# Patient Record
Sex: Male | Born: 1955 | Race: Black or African American | Hispanic: No | Marital: Single | Smoking: Current every day smoker
Health system: Southern US, Community
[De-identification: ages and names within clinical notes are randomized; demographics above are authoritative.]

## PROBLEM LIST (undated history)

## (undated) DIAGNOSIS — I499 Cardiac arrhythmia, unspecified: Secondary | ICD-10-CM

## (undated) DIAGNOSIS — J449 Chronic obstructive pulmonary disease, unspecified: Secondary | ICD-10-CM

## (undated) DIAGNOSIS — B182 Chronic viral hepatitis C: Secondary | ICD-10-CM

## (undated) DIAGNOSIS — Z992 Dependence on renal dialysis: Secondary | ICD-10-CM

## (undated) DIAGNOSIS — I10 Essential (primary) hypertension: Secondary | ICD-10-CM

## (undated) DIAGNOSIS — F4310 Post-traumatic stress disorder, unspecified: Secondary | ICD-10-CM

## (undated) DIAGNOSIS — F17200 Nicotine dependence, unspecified, uncomplicated: Secondary | ICD-10-CM

## (undated) DIAGNOSIS — R0602 Shortness of breath: Secondary | ICD-10-CM

## (undated) DIAGNOSIS — N186 End stage renal disease: Secondary | ICD-10-CM

## (undated) HISTORY — PX: PARACENTESIS: SHX844

---

## 2013-11-11 ENCOUNTER — Encounter (HOSPITAL_COMMUNITY): Payer: Self-pay | Admitting: Emergency Medicine

## 2013-11-11 ENCOUNTER — Emergency Department (HOSPITAL_COMMUNITY): Payer: Medicare Other

## 2013-11-11 ENCOUNTER — Inpatient Hospital Stay (HOSPITAL_COMMUNITY)
Admission: EM | Admit: 2013-11-11 | Discharge: 2013-11-17 | DRG: 640 | Disposition: A | Discharge status: Home / Self Care | Payer: Medicare Other | Attending: Internal Medicine | Admitting: Internal Medicine

## 2013-11-11 DIAGNOSIS — N2581 Secondary hyperparathyroidism of renal origin: Secondary | ICD-10-CM | POA: Diagnosis present

## 2013-11-11 DIAGNOSIS — F319 Bipolar disorder, unspecified: Secondary | ICD-10-CM | POA: Diagnosis present

## 2013-11-11 DIAGNOSIS — F329 Major depressive disorder, single episode, unspecified: Secondary | ICD-10-CM | POA: Diagnosis present

## 2013-11-11 DIAGNOSIS — I120 Hypertensive chronic kidney disease with stage 5 chronic kidney disease or end stage renal disease: Secondary | ICD-10-CM | POA: Diagnosis present

## 2013-11-11 DIAGNOSIS — I499 Cardiac arrhythmia, unspecified: Secondary | ICD-10-CM

## 2013-11-11 DIAGNOSIS — R112 Nausea with vomiting, unspecified: Secondary | ICD-10-CM

## 2013-11-11 DIAGNOSIS — R197 Diarrhea, unspecified: Secondary | ICD-10-CM

## 2013-11-11 DIAGNOSIS — B182 Chronic viral hepatitis C: Secondary | ICD-10-CM | POA: Diagnosis present

## 2013-11-11 DIAGNOSIS — K766 Portal hypertension: Secondary | ICD-10-CM | POA: Diagnosis present

## 2013-11-11 DIAGNOSIS — I10 Essential (primary) hypertension: Secondary | ICD-10-CM | POA: Diagnosis present

## 2013-11-11 DIAGNOSIS — F4310 Post-traumatic stress disorder, unspecified: Secondary | ICD-10-CM | POA: Diagnosis present

## 2013-11-11 DIAGNOSIS — D6959 Other secondary thrombocytopenia: Secondary | ICD-10-CM | POA: Diagnosis present

## 2013-11-11 DIAGNOSIS — J4489 Other specified chronic obstructive pulmonary disease: Secondary | ICD-10-CM | POA: Diagnosis present

## 2013-11-11 DIAGNOSIS — K7460 Unspecified cirrhosis of liver: Secondary | ICD-10-CM | POA: Diagnosis present

## 2013-11-11 DIAGNOSIS — Z992 Dependence on renal dialysis: Secondary | ICD-10-CM

## 2013-11-11 DIAGNOSIS — D631 Anemia in chronic kidney disease: Secondary | ICD-10-CM | POA: Diagnosis present

## 2013-11-11 DIAGNOSIS — N186 End stage renal disease: Secondary | ICD-10-CM

## 2013-11-11 DIAGNOSIS — F1410 Cocaine abuse, uncomplicated: Secondary | ICD-10-CM | POA: Diagnosis present

## 2013-11-11 DIAGNOSIS — I4891 Unspecified atrial fibrillation: Secondary | ICD-10-CM | POA: Diagnosis present

## 2013-11-11 DIAGNOSIS — E875 Hyperkalemia: Principal | ICD-10-CM | POA: Diagnosis present

## 2013-11-11 DIAGNOSIS — F1910 Other psychoactive substance abuse, uncomplicated: Secondary | ICD-10-CM | POA: Diagnosis present

## 2013-11-11 DIAGNOSIS — J9801 Acute bronchospasm: Secondary | ICD-10-CM | POA: Diagnosis present

## 2013-11-11 DIAGNOSIS — I480 Paroxysmal atrial fibrillation: Secondary | ICD-10-CM | POA: Diagnosis present

## 2013-11-11 DIAGNOSIS — Z5900 Homelessness unspecified: Secondary | ICD-10-CM

## 2013-11-11 DIAGNOSIS — F17200 Nicotine dependence, unspecified, uncomplicated: Secondary | ICD-10-CM | POA: Diagnosis present

## 2013-11-11 DIAGNOSIS — J449 Chronic obstructive pulmonary disease, unspecified: Secondary | ICD-10-CM | POA: Diagnosis present

## 2013-11-11 DIAGNOSIS — E8809 Other disorders of plasma-protein metabolism, not elsewhere classified: Secondary | ICD-10-CM | POA: Diagnosis present

## 2013-11-11 DIAGNOSIS — F32A Depression, unspecified: Secondary | ICD-10-CM | POA: Diagnosis present

## 2013-11-11 DIAGNOSIS — IMO0002 Reserved for concepts with insufficient information to code with codable children: Secondary | ICD-10-CM | POA: Diagnosis present

## 2013-11-11 DIAGNOSIS — R188 Other ascites: Secondary | ICD-10-CM | POA: Diagnosis present

## 2013-11-11 DIAGNOSIS — N039 Chronic nephritic syndrome with unspecified morphologic changes: Secondary | ICD-10-CM

## 2013-11-11 HISTORY — DX: End stage renal disease: N18.6

## 2013-11-11 HISTORY — DX: Nicotine dependence, unspecified, uncomplicated: F17.200

## 2013-11-11 HISTORY — DX: Dependence on renal dialysis: Z99.2

## 2013-11-11 HISTORY — DX: Cardiac arrhythmia, unspecified: I49.9

## 2013-11-11 HISTORY — DX: Chronic viral hepatitis C: B18.2

## 2013-11-11 HISTORY — DX: Chronic obstructive pulmonary disease, unspecified: J44.9

## 2013-11-11 HISTORY — DX: Essential (primary) hypertension: I10

## 2013-11-11 LAB — CBC WITH DIFFERENTIAL/PLATELET
Basophils Absolute: 0 10*3/uL (ref 0.0–0.1)
Basophils Relative: 0 % (ref 0–1)
EOS PCT: 0 % (ref 0–5)
Eosinophils Absolute: 0 10*3/uL (ref 0.0–0.7)
HEMATOCRIT: 35.9 % — AB (ref 39.0–52.0)
HEMOGLOBIN: 12.7 g/dL — AB (ref 13.0–17.0)
LYMPHS ABS: 2.1 10*3/uL (ref 0.7–4.0)
LYMPHS PCT: 18 % (ref 12–46)
MCH: 26.3 pg (ref 26.0–34.0)
MCHC: 35.4 g/dL (ref 30.0–36.0)
MCV: 74.3 fL — ABNORMAL LOW (ref 78.0–100.0)
MONO ABS: 1.4 10*3/uL — AB (ref 0.1–1.0)
MONOS PCT: 12 % (ref 3–12)
NEUTROS ABS: 7.8 10*3/uL — AB (ref 1.7–7.7)
Neutrophils Relative %: 70 % (ref 43–77)
Platelets: 113 10*3/uL — ABNORMAL LOW (ref 150–400)
RBC: 4.83 MIL/uL (ref 4.22–5.81)
RDW: 20 % — ABNORMAL HIGH (ref 11.5–15.5)
WBC: 11.3 10*3/uL — AB (ref 4.0–10.5)

## 2013-11-11 LAB — COMPREHENSIVE METABOLIC PANEL
ALT: 88 U/L — ABNORMAL HIGH (ref 0–53)
AST: 151 U/L — ABNORMAL HIGH (ref 0–37)
Albumin: 3.5 g/dL (ref 3.5–5.2)
Alkaline Phosphatase: 161 U/L — ABNORMAL HIGH (ref 39–117)
BILIRUBIN TOTAL: 3.7 mg/dL — AB (ref 0.3–1.2)
BUN: 79 mg/dL — AB (ref 6–23)
CALCIUM: 9.4 mg/dL (ref 8.4–10.5)
CHLORIDE: 88 meq/L — AB (ref 96–112)
CO2: 17 meq/L — AB (ref 19–32)
CREATININE: 8.72 mg/dL — AB (ref 0.50–1.35)
GFR, EST AFRICAN AMERICAN: 7 mL/min — AB (ref >90)
GFR, EST NON AFRICAN AMERICAN: 6 mL/min — AB (ref >90)
GLUCOSE: 67 mg/dL — AB (ref 70–99)
Potassium: 7.3 mEq/L (ref 3.7–5.3)
Sodium: 137 mEq/L (ref 137–147)
Total Protein: 8.5 g/dL — ABNORMAL HIGH (ref 6.0–8.3)

## 2013-11-11 LAB — MAGNESIUM: MAGNESIUM: 2.6 mg/dL — AB (ref 1.5–2.5)

## 2013-11-11 LAB — LACTATE DEHYDROGENASE: LDH: 454 U/L — ABNORMAL HIGH (ref 94–250)

## 2013-11-11 LAB — POTASSIUM: POTASSIUM: 3.6 meq/L — AB (ref 3.7–5.3)

## 2013-11-11 LAB — PHOSPHORUS: PHOSPHORUS: 6.6 mg/dL — AB (ref 2.3–4.6)

## 2013-11-11 LAB — TROPONIN I: Troponin I: <0.3 ng/mL (ref <0.30)

## 2013-11-11 MED ORDER — ONDANSETRON HCL 4 MG/2ML IJ SOLN: 4.0000 mg | Freq: Three times a day (TID) | INTRAMUSCULAR | Status: DC | PRN

## 2013-11-11 MED ORDER — INSULIN ASPART 100 UNIT/ML ~~LOC~~ SOLN
10.0000 [IU] | Freq: Once | SUBCUTANEOUS | Status: AC
  Administered 2013-11-11: 10 [IU] via SUBCUTANEOUS
  Filled 2013-11-11: qty 1

## 2013-11-11 MED ORDER — ONDANSETRON HCL 4 MG PO TABS: 4.0000 mg | ORAL_TABLET | Freq: Four times a day (QID) | ORAL | Status: DC | PRN

## 2013-11-11 MED ORDER — SODIUM CHLORIDE 0.9 % IJ SOLN
3.0000 mL | Freq: Two times a day (BID) | INTRAMUSCULAR | Status: DC
  Administered 2013-11-11: 3 mL via INTRAVENOUS

## 2013-11-11 MED ORDER — DILTIAZEM HCL 25 MG/5ML IV SOLN
5.0000 mg | INTRAVENOUS | Status: DC | PRN
  Filled 2013-11-11: qty 5

## 2013-11-11 MED ORDER — HEPARIN SODIUM (PORCINE) 5000 UNIT/ML IJ SOLN
5000.0000 [IU] | Freq: Three times a day (TID) | INTRAMUSCULAR | Status: DC
  Administered 2013-11-11 – 2013-11-15 (×7): 5000 [IU] via SUBCUTANEOUS
  Filled 2013-11-11 (×20): qty 1

## 2013-11-11 MED ORDER — SODIUM POLYSTYRENE SULFONATE 15 GM/60ML PO SUSP
30.0000 g | Freq: Once | ORAL | Status: AC
  Administered 2013-11-11: 30 g via ORAL
  Filled 2013-11-11: qty 120

## 2013-11-11 MED ORDER — IPRATROPIUM BROMIDE 0.02 % IN SOLN
0.5000 mg | Freq: Four times a day (QID) | RESPIRATORY_TRACT | Status: DC
  Administered 2013-11-11 – 2013-11-12 (×2): 0.5 mg via RESPIRATORY_TRACT
  Filled 2013-11-11 (×2): qty 2.5

## 2013-11-11 MED ORDER — DILTIAZEM HCL 100 MG IV SOLR: 5.0000 mg/h | INTRAVENOUS | Status: DC

## 2013-11-11 MED ORDER — DILTIAZEM HCL 100 MG IV SOLR
5.0000 mg/h | INTRAVENOUS | Status: DC
  Administered 2013-11-11: 5 mg/h via INTRAVENOUS
  Administered 2013-11-12: 15 mg/h via INTRAVENOUS
  Filled 2013-11-11 (×2): qty 100

## 2013-11-11 MED ORDER — SODIUM CHLORIDE 0.9 % IV SOLN: 250.0000 mL | INTRAVENOUS | Status: DC | PRN

## 2013-11-11 MED ORDER — ASPIRIN EC 81 MG PO TBEC
81.0000 mg | DELAYED_RELEASE_TABLET | Freq: Every day | ORAL | Status: DC
  Administered 2013-11-11 – 2013-11-17 (×6): 81 mg via ORAL
  Filled 2013-11-11 (×8): qty 1

## 2013-11-11 MED ORDER — SODIUM CHLORIDE 0.9 % IJ SOLN
3.0000 mL | Freq: Two times a day (BID) | INTRAMUSCULAR | Status: DC
  Administered 2013-11-12 – 2013-11-13 (×3): 3 mL via INTRAVENOUS

## 2013-11-11 MED ORDER — MORPHINE SULFATE 4 MG/ML IJ SOLN
4.0000 mg | Freq: Once | INTRAMUSCULAR | Status: AC
  Administered 2013-11-11: 4 mg via INTRAVENOUS
  Filled 2013-11-11: qty 1

## 2013-11-11 MED ORDER — ONDANSETRON HCL 4 MG/2ML IJ SOLN
4.0000 mg | Freq: Once | INTRAMUSCULAR | Status: AC
  Administered 2013-11-11: 4 mg via INTRAVENOUS
  Filled 2013-11-11: qty 2

## 2013-11-11 MED ORDER — DILTIAZEM HCL 25 MG/5ML IV SOLN
5.0000 mg | INTRAVENOUS | Status: AC | PRN
  Administered 2013-11-11 (×3): 5 mg via INTRAVENOUS
  Filled 2013-11-11: qty 5

## 2013-11-11 MED ORDER — ONDANSETRON HCL 4 MG/2ML IJ SOLN
4.0000 mg | Freq: Four times a day (QID) | INTRAMUSCULAR | Status: DC | PRN
Start: 2013-11-11 — End: 2013-11-17

## 2013-11-11 MED ORDER — DEXTROSE 50 % IV SOLN
1.0000 | Freq: Once | INTRAVENOUS | Status: AC
  Administered 2013-11-11: 50 mL via INTRAVENOUS
  Filled 2013-11-11: qty 50

## 2013-11-11 MED ORDER — ALBUTEROL SULFATE (2.5 MG/3ML) 0.083% IN NEBU
2.5000 mg | INHALATION_SOLUTION | Freq: Four times a day (QID) | RESPIRATORY_TRACT | Status: DC
  Administered 2013-11-11 – 2013-11-12 (×2): 2.5 mg via RESPIRATORY_TRACT
  Filled 2013-11-11 (×6): qty 3

## 2013-11-11 MED ORDER — SODIUM CHLORIDE 0.9 % IJ SOLN: 3.0000 mL | INTRAMUSCULAR | Status: DC | PRN

## 2013-11-11 NOTE — H&P (Signed)
Hospital Admission Note Date: 11/11/2013  Patient name: Carl Benson Medical record number: [id number] Date of birth: May 15, 1956 Age: 58 y.o. Gender: male [facility] [place]  Attending physician: Dr. Josem KaufmannKlima  Internal Medicine Teaching Service Contact Information  Weekday Hours (7AM-5PM):   1st contact: Marjan Rabbani       pgr: [phone]  2nd contact: Darden PalmerSamaya Qureshi    pgr: [phone]  ** If no return call within 15 minutes (after trying both pagers listed above), please call after hours pagers.   After 5 pm or weekends: 1st Contact: Pager: [phone] 2nd Contact: Pager: [phone]  Chief Complaint: nausea, vomiting, diarrhea and abdominal distension and pain  History of Present Illness:  Patient is 58 yo man with past medical history of ESRD on dialysis, HTN, HCV and ascites, COPD, who presents with nausea, vomiting, diarrhea, abdominal distension and pain.   Patient states that he was living in [place] alone until yesterday. He has two sons in [place] and decided to come back to [place]. He took long distant bus overnight and [facility]. He reports that he was supposed to do HD on M/W/F normally, but due to holiday, his HD scheduled was changed. His last HD was on Tuesday (3 days ago, 11/08/13). He states that he started having nausea, vomiting and diarrhea since yesterday. He vomited proximately 5 times, without blood in the vomitus. He had 4-5 times of watery diarrhea without blood in it. He denies recent antibiotics use. He has hx of HCV and developed ascites. He reports having abdominal distension and mild abdominal pain. No fever or chills.  He would like to have paracentesis. Last paracentesis was done approximately one month ago per patient. He could not tell whether he had infection (SBP) or not.   Patient was found to have K of 7.3 in ED without peaked T wave change, but EKG showed TWI in lateral and V4 to V6. He denies change pain. Patient  was treated with insulin and Kayexalate in ED.   Patient has COPD and is using inhalers at home, but can not tell what inhalers he is actually using. He has productive cough with yellow-colored sputum for approximately one month. He has a mild shortness of breath. His oxygen saturation is 96% on 2 L of oxygen in ED.   ROS:  Feels very tired and sleepy. Denies fever, chills, headaches, chest pain, dysuria, urgency, frequency, hematuria, joint pain. Has mild leg swelling bilaterally.   Meds: No current outpatient prescriptions on file.  Allergies: Allergies as of 11/11/2013  . (No Known Allergies)   Past Medical History  Diagnosis Date  . COPD (chronic obstructive pulmonary disease)   . Hypertension   . Hep C w/ coma, chronic   . Irregular heartbeat    Past surgical history: right arm A-V fistular placement Family history: mother died at 7850 years ago and father died at 30 years ago (reason not known), no brothers or sisters per pateint.   History   Social History  . Marital Status: Unknown    Spouse Name: N/A    Number of Children: N/A  . Years of Education: N/A   Occupational History  . Not on file.   Social History Main Topics  . Smoking status: Smoked 1 PAD for 50 years  . Smokeless tobacco: Not on file  . Alcohol Use: Social drinking  . Drug Use: no  . Sexual Activity: Not on file   Other Topics Concern  . Not  on file   Social History Narrative  . Used to live alone in [place], moved to here yesterday, no clear plan, has two sons in [place], but not in contact for more than 5 years.    Review of Systems: Full 14-point review of systems otherwise negative except as noted above in HPI. Physical Exam:   Filed Vitals:   11/11/13 0838 11/11/13 1430 11/11/13 1445  BP: 151/97 154/113 139/98  Pulse: 91 148 158  Temp: 97.6 F (36.4 C)    TempSrc: Oral    Resp: 22 24 32  SpO2: 96%      General: Not in acute distress. HEENT: PERRL, EOMI, no scleral icterus,  No JVD or bruit Cardiac: S1/S2, RRR, No murmurs, gallops or rubs Pulm: Has diffused rhonchi bilaterally, No rales, wheezing, or rubs. Abd: severely distended, diffused mild tender, no rebound pain, no organomegaly, BS present Ext: trace leg edema bilaterally. 2+DP/PT pulse bilaterally Musculoskeletal: No joint deformities, erythema, or stiffness, ROM full Skin: No rashes.  Neuro: Alert and oriented X3, cranial nerves II-XII grossly intact, muscle strength 5/5 in all extremeties, sensation to light touch intact. Psych: Patient is not psychotic, no suicidal or hemocidal ideation.  Lab results: Basic Metabolic Panel:  Recent Labs  56/21/30 0940  NA 137  K 7.3*  CL 88*  CO2 17*  GLUCOSE 67*  BUN 79*  CREATININE 8.72*  CALCIUM 9.4  MG 2.6*   Liver Function Tests:  Recent Labs  11/11/13 0940  AST 151*  ALT 88*  ALKPHOS 161*  BILITOT 3.7*  PROT 8.5*  ALBUMIN 3.5   No results found for this basename: LIPASE, AMYLASE,  in the last 72 hours No results found for this basename: AMMONIA,  in the last 72 hours CBC:  Recent Labs  11/11/13 0940  WBC 11.3*  NEUTROABS 7.8*  HGB 12.7*  HCT 35.9*  MCV 74.3*  PLT 113*   Cardiac Enzymes: No results found for this basename: CKTOTAL, CKMB, CKMBINDEX, TROPONINI,  in the last 72 hours BNP: No results found for this basename: PROBNP,  in the last 72 hours D-Dimer: No results found for this basename: DDIMER,  in the last 72 hours CBG: No results found for this basename: GLUCAP,  in the last 72 hours Hemoglobin A1C: No results found for this basename: HGBA1C,  in the last 72 hours Fasting Lipid Panel: No results found for this basename: CHOL, HDL, LDLCALC, TRIG, CHOLHDL, LDLDIRECT,  in the last 72 hours Thyroid Function Tests: No results found for this basename: TSH, T4TOTAL, FREET4, T3FREE, THYROIDAB,  in the last 72 hours Anemia Panel: No results found for this basename: VITAMINB12, FOLATE, FERRITIN, TIBC, IRON, RETICCTPCT,   in the last 72 hours Coagulation: No results found for this basename: LABPROT, INR,  in the last 72 hours Urine Drug Screen: Drugs of Abuse  No results found for this basename: labopia,  cocainscrnur,  labbenz,  amphetmu,  thcu,  labbarb    Alcohol Level: No results found for this basename: ETH,  in the last 72 hours Urinalysis: No results found for this basename: COLORURINE, APPERANCEUR, LABSPEC, PHURINE, GLUCOSEU, HGBUR, BILIRUBINUR, KETONESUR, PROTEINUR, UROBILINOGEN, NITRITE, LEUKOCYTESUR,  in the last 72 hours Misc. Labs:   Imaging results:  Dg Chest 2 View  11/11/2013   CLINICAL DATA:  Shortness of breath  EXAM: CHEST  2 VIEW  COMPARISON:  None.  FINDINGS: Low lung volumes. Cardiac silhouette is enlarged. Aorta is tortuous. Mild prominence of the interstitial markings. There is blunting of  the costophrenic angles. Minimal areas of increased density project within lung bases. No focal regions of consolidation. The osseous structures unremarkable.  IMPRESSION: Cardiomegaly. Pulmonary vascular congestion. Likely atelectasis within the lung bases. Trace bilateral effusions also a diagnostic consideration.   Electronically Signed   By: Salome Holmes M.D.   On: 11/11/2013 09:33    Other results:  EKG: Sinus rhythm, regular, normal R wave progression, normal QT interval, T-wave inversion in lateral and V4 to V6 leads (no previous EKG for comparison)  ssessment & Plan by Problem: Principal Problem:   Hyperkalemia Active Problems:   COPD (chronic obstructive pulmonary disease)   Hep C w/ coma, chronic   ESRD on hemodialysis   Ascites  58 year old man with past medical history of ESRD on dialysis, HTN, HCV and ascites, COPD, who presents with nausea, vomiting, diarrhea, abdominal distension and pain. Last HD was 11/08/13. K 7.3 on admission.  Cre 8.72 and BUN 79 on admission. Chest x-ray showed pulmonary vascular congestion. AST 151, ALT 88, ALT 161, total bilirubin 3.7, ALP 161, protein  8.5.  poc proBNP negative. WBC 11.3.   #: ESRD on HD: K 7.3, Cre 8.72, BUN 79, bicarbonate 17 on admission . Last HD was 3 days ago. His nausea, vomiting and diarrhea may be partially explained by the azotemia. Patient was treated with insulin and Kayexalate for hyperkalemia in ED. Renal was consulted by ED.  - will admitted to tele bed - will start HD per renal - will check phosphorus, PTH, Mg per renal - will need to get medical record from [facility]piatl.  #: Nausea, vomiting and diarrhea: It is likely due to azotemia secondary to noncompliance to dialysis. The other differential diagnoses include, gastroenteritis and C. difficile colitis (less likely given no recent history of antibiotics used).  -will treat symptomatically with Zofran for nausea -HD for Azotemia  #: A Fib: patient initial HR was 90 to 95 on admission, but increased to 140 to 150 while getting the HD. No chest pain. Bp stable. Dr. Arlean Hopping ordered Cardizem bolus 5 mg q15 minus. Patient said he may have A fib before. CHADS score is 1.   -will switch to Cardizem gtt when pt completes the HD.  -will start ASA 81 mg daily  #: Ascites secondary to hepatitis C: Patient has significant ascites with mild, but diffused abdominal tenderness. He also has leukocytosis with WBC 11.3. It is concerned for SBP.  -will get paracentesis for diagnostic and therapeutic purpose. -will f/u fluid analysis. Will treat with antibiotics if positive SBP  #: Abnormal EKG with T-wave inversion: Patient does not have chest pain, but EKG showed T wave inversion in lateral leads and V4 to V6. Not sure whether patient has history of CAD. -will get trop q6h x 3.  - check lipid profile and A1c  #: COPD:  patient has long history of smoking. One pack a day for 51 years. He is using inhalers at home, but can not tell what inhalers he is actually using. Patient has a productive cough, but oxygen saturation is good and the has only mild shortness of  breath. Not in acute exacerbation.  -will start albuterol and Atrovent nebulizers q6h , plus albuterol nebulizer when necessary for shortness of breath.   #:  Social issue:  Patient moved from [place] to [place] without clear plan for living here. He has two sons in [place], whom he has not seen for more than 5 years.  -will consult to SW  #  F/E/N  -SL -Hypokalemia:  Treated with insulin and Kayexalate, will be on HD dialysis today -Diet: renal  # DVT px: Heparin sq  Dispo: Disposition is deferred at this time, awaiting improvement of current medical problems. Anticipated discharge in approximately 3 day(s).   The patient does not have a current PCP (No primary provider on file.), therefore is not requiring OPC follow-up after discharge.   The patient does have transportation limitations that hinder transportation to clinic appointments.  Signed:  Lorretta Harp, MD PGY3, Internal Medicine Teaching Service Pager: [phone]  11/11/2013, 2:54 PM

## 2013-11-11 NOTE — ED Notes (Signed)
Pt vomiting, brown colored emesis. Josh Geiple aware.

## 2013-11-11 NOTE — Progress Notes (Signed)
Pt has money all over bed as well as a box of cigarettes. Pt informed of the policy about his belongings. Pt informed that we are not responsible for his belongings and that he could get his money as well as any other valuables sent down with security to be stored. Pt stated he was aware of the policies and is refusing to get them sent down. Pt informed that this is a smoke free facility and that he can not smoke here & stated he was aware. Will continue to monitor the pt. Sanda Linger, RN

## 2013-11-11 NOTE — ED Notes (Signed)
Pt states he needs his dialysis. States last dialysis on Tues. States he's from [place]. Pt also c/o nausea, vomiting, diarrhea. Pt awake, alert.

## 2013-11-11 NOTE — ED Notes (Signed)
Unable to obtain labs from IV start. Will page phlebotomy.

## 2013-11-11 NOTE — ED Notes (Signed)
Nephrology at bedside

## 2013-11-11 NOTE — ED Provider Notes (Signed)
CSN: [id number]     Arrival date & time 11/11/13  7225 History   First MD Initiated Contact with Patient 11/11/13 0831     Chief Complaint  Patient presents with  . Vascular Access Problem   (Consider location/radiation/quality/duration/timing/severity/associated sxs/prior Treatment) HPI Comments: Patient with history of ESRD on dialysis, COPD, ascites (hep C) -- presents with complaint of nausea, vomiting, and diarrhea. Patient was living in [place] until yesterday when he decided that he was moving back to [place]. He arrives here not having dialysis since Tuesday (3 days ago). Patient also would like to have paracentesis due to worsening ascites - last performed one month ago he states. No CP, SOB. His lower extremities are swollen. Also c/o N/V/D for the past 2 days. The onset of this condition was gradual. The course is gradually worsening. Aggravating factors: none. Alleviating factors: none.    The history is provided by the patient.    Past Medical History  Diagnosis Date  . COPD (chronic obstructive pulmonary disease)   . Hypertension   . Hep C w/ coma, chronic   . Irregular heartbeat    No past surgical history on file. No family history on file. History  Substance Use Topics  . Smoking status: Not on file  . Smokeless tobacco: Not on file  . Alcohol Use: Not on file    Review of Systems  Constitutional: Negative for fever.  HENT: Negative for rhinorrhea and sore throat.   Eyes: Negative for redness.  Respiratory: Negative for cough and shortness of breath.   Cardiovascular: Positive for leg swelling. Negative for chest pain.  Gastrointestinal: Positive for nausea, vomiting, diarrhea and abdominal distention. Negative for abdominal pain.  Genitourinary: Negative for dysuria.  Musculoskeletal: Negative for myalgias.  Skin: Negative for rash.  Neurological: Negative for headaches.    Allergies  Review of patient's allergies indicates no known allergies.  Home  Medications  No current outpatient prescriptions on file. BP 151/97  Pulse 91  Temp(Src) 97.6 F (36.4 C) (Oral)  Resp 22  SpO2 96% Physical Exam  Nursing note and vitals reviewed. Constitutional: He appears well-developed and well-nourished.  HENT:  Head: Normocephalic and atraumatic.  Eyes: Conjunctivae are normal. Right eye exhibits no discharge. Left eye exhibits no discharge.  Neck: Normal range of motion. Neck supple. No JVD present.  Cardiovascular: Normal rate and regular rhythm.   No murmur heard. Pulmonary/Chest: Effort normal and breath sounds normal.  Abdominal: Soft. He exhibits distension (ascites). There is no tenderness.  Musculoskeletal: He exhibits edema (2-3+ edema of lower legs to knees, symmetric). He exhibits no tenderness.  Neurological: He is alert.  Skin: Skin is warm and dry.  Psychiatric: He has a normal mood and affect.    ED Course  Procedures (including critical care time) Labs Review Labs Reviewed  CBC WITH DIFFERENTIAL - Abnormal; Notable for the following:    WBC 11.3 (*)    Hemoglobin 12.7 (*)    HCT 35.9 (*)    MCV 74.3 (*)    RDW 20.0 (*)    Platelets 113 (*)    Neutro Abs 7.8 (*)    Monocytes Absolute 1.4 (*)    All other components within normal limits  COMPREHENSIVE METABOLIC PANEL - Abnormal; Notable for the following:    Potassium 7.3 (*)    Chloride 88 (*)    CO2 17 (*)    Glucose, Bld 67 (*)    BUN 79 (*)    Creatinine, Ser 8.72 (*)  Total Protein 8.5 (*)    AST 151 (*)    ALT 88 (*)    Alkaline Phosphatase 161 (*)    Total Bilirubin 3.7 (*)    GFR calc non Af Amer 6 (*)    GFR calc Af Amer 7 (*)    All other components within normal limits  MAGNESIUM - Abnormal; Notable for the following:    Magnesium 2.6 (*)    All other components within normal limits   Imaging Review Dg Chest 2 View  11/11/2013   CLINICAL DATA:  Shortness of breath  EXAM: CHEST  2 VIEW  COMPARISON:  None.  FINDINGS: Low lung volumes. Cardiac  silhouette is enlarged. Aorta is tortuous. Mild prominence of the interstitial markings. There is blunting of the costophrenic angles. Minimal areas of increased density project within lung bases. No focal regions of consolidation. The osseous structures unremarkable.  IMPRESSION: Cardiomegaly. Pulmonary vascular congestion. Likely atelectasis within the lung bases. Trace bilateral effusions also a diagnostic consideration.   Electronically Signed   By: Salome HolmesHector  Cooper M.D.   On: 11/11/2013 09:33    EKG Interpretation   None      8:47 AM Patient seen and examined. Work-up initiated. Medications ordered.   Vital signs reviewed and are as follows: Filed Vitals:   11/11/13 0838  BP: 151/97  Pulse: 91  Temp: 97.6 F (36.4 C)  Resp: 22    Date: 11/11/2013  Rate: 91  Rhythm: normal sinus rhythm  QRS Axis: right  Intervals: normal  ST/T Wave abnormalities: nonspecific ST/T changes  Conduction Disutrbances:none  Narrative Interpretation:   Old EKG Reviewed: none available  10:15 AM Pt vomiting.   12:40 PM Hyperkalemia was noted. NO EKG changes. Patient given insulin and dextrose as well as kayexalate.   I spoke with Dr. Arta SilenceShertz who will arrange for dialysis today.   I have spoken with IMTS who will see and admit.   MDM   1. Hyperkalemia   2. Nausea vomiting and diarrhea   3. Ascites   4. ESRD (end stage renal disease)   5. COPD (chronic obstructive pulmonary disease)   6. Hep C w/ coma, chronic   7. Irregular heartbeat    Admit for above, will need urgent dialysis, stabilization of electrolytes, likely therapeutic paracentesis. Doubt SBP given minimal tenderness and no fever, mild leukocytosis.     Renne CriglerJoshua Orry Sigl, PA-C 11/11/13 1528
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
## 2013-11-11 NOTE — Progress Notes (Signed)
UR completed 

## 2013-11-11 NOTE — Procedures (Signed)
I was present at this dialysis session, have reviewed the session itself and made  appropriate changes  [name] MD (pgr) [phone]    ([phone] 11/11/2013, 1:45 PM

## 2013-11-11 NOTE — ED Notes (Signed)
Pt stating he has abdominal pain. Carl Benson aware.

## 2013-11-11 NOTE — Progress Notes (Signed)
Pt started on cardizem drip at 5mg /hr at 2012. Pt's cardizem increased to 10mg /hr at 2047. Pt's VS charted. Will continue to monitor the pt. Sanda Linger, RN

## 2013-11-11 NOTE — ED Notes (Signed)
Per [place] bus [place] in need of dialysis. Also reports nausea/vomiting/diarrhea x several days.

## 2013-11-12 ENCOUNTER — Inpatient Hospital Stay (HOSPITAL_COMMUNITY): Payer: Medicare Other

## 2013-11-12 DIAGNOSIS — Z5900 Homelessness unspecified: Secondary | ICD-10-CM

## 2013-11-12 DIAGNOSIS — Z992 Dependence on renal dialysis: Secondary | ICD-10-CM

## 2013-11-12 DIAGNOSIS — I499 Cardiac arrhythmia, unspecified: Secondary | ICD-10-CM

## 2013-11-12 DIAGNOSIS — I129 Hypertensive chronic kidney disease with stage 1 through stage 4 chronic kidney disease, or unspecified chronic kidney disease: Secondary | ICD-10-CM

## 2013-11-12 DIAGNOSIS — J449 Chronic obstructive pulmonary disease, unspecified: Secondary | ICD-10-CM

## 2013-11-12 DIAGNOSIS — I4891 Unspecified atrial fibrillation: Secondary | ICD-10-CM

## 2013-11-12 DIAGNOSIS — K7460 Unspecified cirrhosis of liver: Secondary | ICD-10-CM

## 2013-11-12 DIAGNOSIS — D696 Thrombocytopenia, unspecified: Secondary | ICD-10-CM

## 2013-11-12 DIAGNOSIS — N186 End stage renal disease: Secondary | ICD-10-CM

## 2013-11-12 DIAGNOSIS — F17200 Nicotine dependence, unspecified, uncomplicated: Secondary | ICD-10-CM

## 2013-11-12 DIAGNOSIS — R188 Other ascites: Secondary | ICD-10-CM

## 2013-11-12 DIAGNOSIS — D509 Iron deficiency anemia, unspecified: Secondary | ICD-10-CM

## 2013-11-12 LAB — BODY FLUID CELL COUNT WITH DIFFERENTIAL
Lymphs, Fluid: 38 %
Monocyte-Macrophage-Serous Fluid: 57 % (ref 50–90)
Neutrophil Count, Fluid: 2 % (ref 0–25)
Total Nucleated Cell Count, Fluid: 470 cu mm (ref 0–1000)

## 2013-11-12 LAB — CBC
HEMATOCRIT: 29.3 % — AB (ref 39.0–52.0)
Hemoglobin: 10.6 g/dL — ABNORMAL LOW (ref 13.0–17.0)
MCH: 26.3 pg (ref 26.0–34.0)
MCHC: 36.2 g/dL — ABNORMAL HIGH (ref 30.0–36.0)
MCV: 72.7 fL — ABNORMAL LOW (ref 78.0–100.0)
PLATELETS: 113 10*3/uL — AB (ref 150–400)
RBC: 4.03 MIL/uL — AB (ref 4.22–5.81)
RDW: 19.4 % — AB (ref 11.5–15.5)
WBC: 10.9 10*3/uL — AB (ref 4.0–10.5)

## 2013-11-12 LAB — BASIC METABOLIC PANEL
BUN: 43 mg/dL — AB (ref 6–23)
CHLORIDE: 92 meq/L — AB (ref 96–112)
CO2: 23 mEq/L (ref 19–32)
Calcium: 8.4 mg/dL (ref 8.4–10.5)
Creatinine, Ser: 5.7 mg/dL — ABNORMAL HIGH (ref 0.50–1.35)
GFR, EST AFRICAN AMERICAN: 11 mL/min — AB (ref >90)
GFR, EST NON AFRICAN AMERICAN: 10 mL/min — AB (ref >90)
Glucose, Bld: 146 mg/dL — ABNORMAL HIGH (ref 70–99)
Potassium: 4 mEq/L (ref 3.7–5.3)
SODIUM: 137 meq/L (ref 137–147)

## 2013-11-12 LAB — LACTATE DEHYDROGENASE, PLEURAL OR PERITONEAL FLUID: LD, Fluid: 142 U/L — ABNORMAL HIGH (ref 3–23)

## 2013-11-12 LAB — HEMOGLOBIN A1C
HEMOGLOBIN A1C: 5.3 % (ref <5.7)
Mean Plasma Glucose: 105 mg/dL (ref <117)

## 2013-11-12 LAB — GRAM STAIN

## 2013-11-12 LAB — PROTEIN, BODY FLUID: Total protein, fluid: 3.6 g/dL

## 2013-11-12 LAB — HEPATITIS B SURFACE ANTIGEN: HEP B S AG: NEGATIVE

## 2013-11-12 LAB — HEPATITIS B SURFACE ANTIBODY,QUALITATIVE: Hep B S Ab: POSITIVE — AB

## 2013-11-12 LAB — GLUCOSE, SEROUS FLUID: GLUCOSE FL: 169 mg/dL

## 2013-11-12 LAB — TROPONIN I: Troponin I: <0.3 ng/mL (ref <0.30)

## 2013-11-12 LAB — LIPID PANEL
Cholesterol: 92 mg/dL (ref 0–200)
HDL: 13 mg/dL — ABNORMAL LOW (ref >39)
LDL CALC: 63 mg/dL (ref 0–99)
Total CHOL/HDL Ratio: 7.1 RATIO
Triglycerides: 78 mg/dL (ref <150)
VLDL: 16 mg/dL (ref 0–40)

## 2013-11-12 LAB — HEPATITIS B CORE ANTIBODY, IGM: Hep B C IgM: NONREACTIVE

## 2013-11-12 MED ORDER — METOPROLOL TARTRATE 50 MG PO TABS
50.0000 mg | ORAL_TABLET | Freq: Two times a day (BID) | ORAL | Status: DC
  Administered 2013-11-12 – 2013-11-17 (×8): 50 mg via ORAL
  Filled 2013-11-12 (×11): qty 1

## 2013-11-12 MED ORDER — IPRATROPIUM BROMIDE 0.02 % IN SOLN
0.5000 mg | Freq: Two times a day (BID) | RESPIRATORY_TRACT | Status: DC
  Filled 2013-11-12: qty 2.5

## 2013-11-12 MED ORDER — DOXERCALCIFEROL 4 MCG/2ML IV SOLN
2.0000 ug | INTRAVENOUS | Status: DC
  Administered 2013-11-14: 2 ug via INTRAVENOUS
  Filled 2013-11-12: qty 2

## 2013-11-12 MED ORDER — ALBUTEROL SULFATE (2.5 MG/3ML) 0.083% IN NEBU
2.5000 mg | INHALATION_SOLUTION | Freq: Two times a day (BID) | RESPIRATORY_TRACT | Status: DC
  Filled 2013-11-12 (×3): qty 3

## 2013-11-12 MED ORDER — DIPHENHYDRAMINE HCL 25 MG PO CAPS
25.0000 mg | ORAL_CAPSULE | Freq: Once | ORAL | Status: AC
  Administered 2013-11-12: 25 mg via ORAL
  Filled 2013-11-12 (×2): qty 1

## 2013-11-12 MED ORDER — DARBEPOETIN ALFA-POLYSORBATE 25 MCG/0.42ML IJ SOLN
25.0000 ug | INTRAMUSCULAR | Status: DC
Start: 2013-11-14 — End: 2013-11-17
  Administered 2013-11-14: 25 ug via INTRAVENOUS
  Filled 2013-11-12: qty 0.42

## 2013-11-12 MED ORDER — CALCIUM ACETATE 667 MG PO CAPS
1334.0000 mg | ORAL_CAPSULE | Freq: Three times a day (TID) | ORAL | Status: DC
  Administered 2013-11-12 – 2013-11-17 (×11): 1334 mg via ORAL
  Filled 2013-11-12 (×18): qty 2

## 2013-11-12 MED ORDER — DARBEPOETIN ALFA-POLYSORBATE 25 MCG/0.42ML IJ SOLN: 25.0000 ug | INTRAMUSCULAR | Status: DC
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
## 2013-11-12 NOTE — Procedures (Signed)
Successful US guided paracentesis from RLQ.  Yielded 1.3L of clear dark yellow fluid.  No immediate complications.  Pt tolerated well.   Specimen was sent for labs.  Brayton El PA-C 11/12/2013 10:22 AM

## 2013-11-12 NOTE — Procedures (Signed)
I was present at this dialysis session, have reviewed the session itself and made  appropriate changes  Rob Antolin Belsito MD (pgr) [phone]    (c) [phone] 11/12/2013, 2:10 PM   

## 2013-11-12 NOTE — Progress Notes (Signed)
MD notified of pt c/o of itching per pt he's allergic to benadryl it causes him to itch. MD also aware of pt converting to NSR hr in the 70s. No new orders given at this time will continue to monitor the pt. Sanda Linger, RN

## 2013-11-12 NOTE — Progress Notes (Addendum)
Subjective:  Pt seen and examined in AM. No acute events overnight. Pt reports feeling well. Still with abdominal distension and diffuse pain, however denies fever, chills, nausea, vomiting, change in BM or urination.   Objective: Vital signs in last 24 hours: Filed Vitals:   11/12/13 1500 11/12/13 1530 11/12/13 1543 11/12/13 1632  BP: 112/69 119/67 120/70 120/72  Pulse: 87 84 84 89  Temp:   98 F (36.7 C)   TempSrc:   Oral   Resp:   20   Height:      Weight:   55.6 kg (122 lb 9.2 oz)   SpO2:   99% 93%   Weight change:   Intake/Output Summary (Last 24 hours) at 11/12/13 1711 Last data filed at 11/12/13 1543  Gross per 24 hour  Intake    780 ml  Output   6288 ml  Net  -5508 ml   General: Not in acute distress.  HEENT: PERRL, EOMI, no scleral icterus, No JVD or bruit  Cardiac: S1/S2, RRR, No murmurs, gallops or rubs  Pulm:  No rales, wheezing, or rubs.  Abd: severely distended, diffused mild tender, no rebound pain, no organomegaly, BS present  Ext: trace leg edema bilaterally  Musculoskeletal: No joint deformities, erythema, or stiffness, ROM full  Skin: No rashes.  Neuro: Alert and oriented X3.  Psych: Angry   Lab Results: Basic Metabolic Panel:  Recent Labs Lab 11/11/13 0940 11/11/13 1628 11/11/13 2222 11/12/13 0513  NA 137  --   --  137  K 7.3* 3.6*  --  4.0  CL 88*  --   --  92*  CO2 17*  --   --  23  GLUCOSE 67*  --   --  146*  BUN 79*  --   --  43*  CREATININE 8.72*  --   --  5.70*  CALCIUM 9.4  --   --  8.4  MG 2.6*  --   --   --   PHOS  --   --  6.6*  --    Liver Function Tests:  Recent Labs Lab 11/11/13 0940  AST 151*  ALT 88*  ALKPHOS 161*  BILITOT 3.7*  PROT 8.5*  ALBUMIN 3.5   No results found for this basename: LIPASE, AMYLASE,  in the last 168 hours No results found for this basename: AMMONIA,  in the last 168 hours CBC:  Recent Labs Lab 11/11/13 0940 11/12/13 0513  WBC 11.3* 10.9*  NEUTROABS 7.8*  --   HGB 12.7* 10.6*    HCT 35.9* 29.3*  MCV 74.3* 72.7*  PLT 113* 113*   Cardiac Enzymes:  Recent Labs Lab 11/11/13 2222 11/12/13 0740  TROPONINI <0.30 <0.30   BNP: No results found for this basename: PROBNP,  in the last 168 hours D-Dimer: No results found for this basename: DDIMER,  in the last 168 hours CBG: No results found for this basename: GLUCAP,  in the last 168 hours Hemoglobin A1C: No results found for this basename: HGBA1C,  in the last 168 hours Fasting Lipid Panel:  Recent Labs Lab 11/12/13 0513  CHOL 92  HDL 13*  LDLCALC 63  TRIG 78  CHOLHDL 7.1   Thyroid Function Tests: No results found for this basename: TSH, T4TOTAL, FREET4, T3FREE, THYROIDAB,  in the last 168 hours Coagulation: No results found for this basename: LABPROT, INR,  in the last 168 hours Anemia Panel: No results found for this basename: VITAMINB12, FOLATE, FERRITIN, TIBC, IRON, RETICCTPCT,  in the last 168 hours Urine Drug Screen: Drugs of Abuse  No results found for this basename: labopia, cocainscrnur, labbenz, amphetmu, thcu, labbarb    Alcohol Level: No results found for this basename: ETH,  in the last 168 hours Urinalysis: No results found for this basename: COLORURINE, APPERANCEUR, LABSPEC, PHURINE, GLUCOSEU, HGBUR, BILIRUBINUR, KETONESUR, PROTEINUR, UROBILINOGEN, NITRITE, LEUKOCYTESUR,  in the last 168 hours   Micro Results: Recent Results (from the past 240 hour(s))  GRAM STAIN     Status: None   Collection Time    11/12/13 10:14 AM      Result Value Range Status   Specimen Description ASCITIC FLUID   Final   Special Requests NONE   Final   Gram Stain     Final   Value: WBC PRESENT,BOTH PMN AND MONONUCLEAR     NO ORGANISMS SEEN     CYTOSPIN SLIDE     Gram Stain Report Called to,Read Back By and Verified With: COLE A.,RN 11/12/13 1117 BY JONESJ   Report Status 11/12/2013 FINAL   Final   Studies/Results: Dg Chest 2 View  11/11/2013   CLINICAL DATA:  Shortness of breath  EXAM: CHEST  2  VIEW  COMPARISON:  None.  FINDINGS: Low lung volumes. Cardiac silhouette is enlarged. Aorta is tortuous. Mild prominence of the interstitial markings. There is blunting of the costophrenic angles. Minimal areas of increased density project within lung bases. No focal regions of consolidation. The osseous structures unremarkable.  IMPRESSION: Cardiomegaly. Pulmonary vascular congestion. Likely atelectasis within the lung bases. Trace bilateral effusions also a diagnostic consideration.   Electronically Signed   By: Salome Holmes M.D.   On: 11/11/2013 09:33   US Paracentesis  11/12/2013   CLINICAL DATA:  Abdominal pain, history of hepatitis-C. Ascites. Request diagnostic and therapeutic paracentesis.  EXAM: ULTRASOUND GUIDED PARACENTESIS  COMPARISON:  None.  PROCEDURE: An ultrasound guided paracentesis was thoroughly discussed with the patient and questions answered. The benefits, risks, alternatives and complications were also discussed. The patient understands and wishes to proceed with the procedure. Written consent was obtained.  Ultrasound was performed to localize and mark an adequate pocket of fluid in the right lower quadrant of the abdomen. The area was then prepped and draped in the normal sterile fashion. 1% Lidocaine was used for local anesthesia. Under ultrasound guidance a 19 gauge Yueh catheter was introduced. Paracentesis was performed. The catheter was removed and a dressing applied.  COMPLICATIONS: None immediate  FINDINGS: A total of approximately 1.3 L of clear, dark yellow fluid was removed. A fluid sample was sent for laboratory analysis.  IMPRESSION: Successful ultrasound guided paracentesis yielding 1.3 L of ascites.  Read by: Brayton El PA-C   Electronically Signed   By: Richarda Overlie M.D.   On: 11/12/2013 10:24   Medications: I have reviewed the patient's current medications. Scheduled Meds: . albuterol  2.5 mg Nebulization BID  . aspirin EC  81 mg Oral Daily  . calcium acetate   1,334 mg Oral TID WC  . [START ON 11/14/2013] darbepoetin  25 mcg Intravenous Q Mon-HD  . [START ON 11/14/2013] doxercalciferol  2 mcg Intravenous Q Mon-HD  . heparin  5,000 Units Subcutaneous Q8H  . ipratropium  0.5 mg Nebulization BID  . metoprolol tartrate  50 mg Oral BID  . sodium chloride  3 mL Intravenous Q12H  . sodium chloride  3 mL Intravenous Q12H   Continuous Infusions:  PRN Meds:.sodium chloride, diltiazem, ondansetron (ZOFRAN) IV, ondansetron, sodium chloride Assessment/Plan:  Principal Problem:   Hyperkalemia Active Problems:   COPD (chronic obstructive pulmonary disease)   Hep C w/ coma, chronic   ESRD on hemodialysis   Ascites  Assessment: 58 year old man with ESRD on HD who presents with abdominal distension and pain and found to have hyperkalemia.    Hyperkalemia - resolved. Pt presented with K 7.3 without EKG changes. Pt received IV insulin and Kayexalate in ED.   -Continue HD -Continue to monitor BMP  End Stage Renal Disease on HD - on MWF schedule -Monitor wt (no change) and I & Os (-4 .3L out) -PTH pending  -Start phoslo binder -Consider rena-vit -Continue vitamin D 2ug per week -Dialysis center search -Nephrology following  Atrial Fibrillation  - currently normal sinus.rhythm.  Pt without home medications diltiazem and carvedilol and went into afib with RVR in HD where IV diltiazem was administered.  CEs negative and pt chest-pain free.  -D/C IV diltiazem -Start metoprolol 50 mg BID -Continue 81 mg aspirin daily  Hypertension - currently normotensive. At home on amlodipine and carvedilol (?).  -Start metoprolol 50 mg BID  Chronic liver cirrhosis with ascites - Most likely due to chronic Hepatitis C virus. Diagnostic and therapeutic (1.3L) paracentesis performed on 1/3 with no PMN>250 or single organism on culture to suggest SBP    -Awaiting cytology of ascites fluid -Consider diuretic therapy  Microcytic anemia -stable H/H without active bleeding or  hemodynamic instability. Most likely due to CKD with   -Continue to monitor  -Continue ESA with darbepotein 25 weekly -Continue hectorol wit HD -Consider anemia panel to assess iron stores and need for Epo and Venofer  Thrombocytopenia - stable with no active bleeding or bruising. Pt presented with platelt count of 113K with unknown baseline. Most likely due to chronic liver cirrhosis.  -Continue to monitor CBC  COPD - no recent exacerbations requiring steroid use. Pt with reported productive cough for 1 month and mild dyspnea on admission, with no wheezing or chest tightness. Pt on unknown inhalers and chronic heavy smoker with 50 pack history.  -Albuterol nebulizer PRN acute bronchospasm -Ipatropium nebulizer BID  Substance Abuse - Pt with history of cocaine use. Also cigarette smoker (1 pack 50 yrs). -Encourage drug cessation   Homelessness - Pt recently moved from [place] and currently homeless, however per pt he receives money monthly. Has 2 sons in town however has not had contact in past 5 years. States he does not want to go to a shelter.  -SW consult  -[facility]   Dispo: Disposition is deferred at this time, awaiting improvement of current medical problems.  Anticipated discharge in approximately 3-4 day(s).   The patient does not have a current PCP (No primary provider on file.) and does need [facility] after discharge.  The patient does have transportation limitations that hinder transportation to clinic appointments.  .Services Needed at time of discharge: Y = Yes, Blank = No PT:   OT:   RN:   Equipment:   Other:     LOS: 1 day   Otis BraceMarjan Shirley Bolle, MD 11/12/2013, 5:11 PM

## 2013-11-12 NOTE — Progress Notes (Signed)
Pt still a-fib hr in he low 100s to 120s. Pt currently trying to leave to go outside to smoke. Pt informed that this was a smoke free facility. Nicotine patch offered pt refused stating it would interfere with his current medications. Pt requesting something for sleep. MD notified. Will continue to monitor the pt. Sanda Linger, RN

## 2013-11-12 NOTE — Progress Notes (Signed)
  [facility] Progress Note   Subjective: Had some cramping w hd yest, 4.3 kg off with no BP drop  Filed Vitals:   11/11/13 2047 11/11/13 2239 11/12/13 0355 11/12/13 0813  BP: 148/110 144/107 123/79 131/85  Pulse: 155 158 53 90  Temp:   98.1 F (36.7 C)   TempSrc:   Oral   Resp:   18   Height:      Weight:   55.215 kg (121 lb 11.6 oz)   SpO2: 100% 98% 92%   Exam:   Alert, no distress Clear bilat, no rales or wheezing  RRR no M or rub  Abd tense ascites, mild diffuse tenderness  2+ bilat LE edema  Alert, nf, ox3, no asterixis RUA AV graft is patent  CXR- clear   Outpatient Dialysis: MWF in [place], [place] (ph [phone])  4h   58.5kg    F160    2K/2.5Ca Bath    Heparin 3000  RUA AVGG Hectorol 2ug once per wk     EPO 8000 1x per wk  Assessment/Plan:  1. Hyperkalemia: resolved 2. Volume overload / LE edema: extra HD today for volume overload, will need lower dry wt 3. Ascites: hep C per pt 4. Afib with RVR: on dilt drip, HR 90's, taking ASA also 5. ESRD: usual HD MWF in [place], is here without outpatient HD orders. He says he is going to be staying here in [place]  6. Ascites: according to pt he has hep C, and has had one paracentesis about a month ago 7. 2HPTH: CA ok, phos high 6.6, pth pending, cont vit D 2 ug once per week, start phoslo as binder 8. Anemia of CKD: Hb 10.6, cont esa with darbe 25/wk 9. COPD  [name] MD pager [phone]    cell [phone] 11/12/2013, 8:47 AM   Recent Labs Lab 11/11/13 0940 11/11/13 1628 11/11/13 2222 11/12/13 0513  NA 137  --   --  137  K 7.3* 3.6*  --  4.0  CL 88*  --   --  92*  CO2 17*  --   --  23  GLUCOSE 67*  --   --  146*  BUN 79*  --   --  43*  CREATININE 8.72*  --   --  5.70*  CALCIUM 9.4  --   --  8.4  PHOS  --   --  6.6*  --     Recent Labs Lab 11/11/13 0940  AST 151*  ALT 88*  ALKPHOS 161*  BILITOT 3.7*  PROT 8.5*  ALBUMIN 3.5    Recent Labs Lab 11/11/13 0940 11/12/13 0513   WBC 11.3* 10.9*  NEUTROABS 7.8*  --   HGB 12.7* 10.6*  HCT 35.9* 29.3*  MCV 74.3* 72.7*  PLT 113* 113*   . albuterol  2.5 mg Nebulization Q6H  . aspirin EC  81 mg Oral Daily  . heparin  5,000 Units Subcutaneous Q8H  . ipratropium  0.5 mg Nebulization Q6H  . sodium chloride  3 mL Intravenous Q12H  . sodium chloride  3 mL Intravenous Q12H   . diltiazem (CARDIZEM) infusion 10 mg/hr (11/12/13 0810)   sodium chloride, diltiazem, ondansetron (ZOFRAN) IV, ondansetron, sodium chloride

## 2013-11-13 ENCOUNTER — Encounter (HOSPITAL_COMMUNITY): Payer: Self-pay | Admitting: *Deleted

## 2013-11-13 DIAGNOSIS — F1910 Other psychoactive substance abuse, uncomplicated: Secondary | ICD-10-CM | POA: Diagnosis present

## 2013-11-13 DIAGNOSIS — I10 Essential (primary) hypertension: Secondary | ICD-10-CM | POA: Diagnosis present

## 2013-11-13 DIAGNOSIS — F329 Major depressive disorder, single episode, unspecified: Secondary | ICD-10-CM | POA: Diagnosis present

## 2013-11-13 DIAGNOSIS — B182 Chronic viral hepatitis C: Secondary | ICD-10-CM | POA: Diagnosis present

## 2013-11-13 DIAGNOSIS — F32A Depression, unspecified: Secondary | ICD-10-CM | POA: Diagnosis present

## 2013-11-13 DIAGNOSIS — I480 Paroxysmal atrial fibrillation: Secondary | ICD-10-CM | POA: Diagnosis present

## 2013-11-13 DIAGNOSIS — K7460 Unspecified cirrhosis of liver: Secondary | ICD-10-CM

## 2013-11-13 DIAGNOSIS — F4310 Post-traumatic stress disorder, unspecified: Secondary | ICD-10-CM | POA: Diagnosis present

## 2013-11-13 LAB — RENAL FUNCTION PANEL
Albumin: 2.4 g/dL — ABNORMAL LOW (ref 3.5–5.2)
BUN: 35 mg/dL — ABNORMAL HIGH (ref 6–23)
CALCIUM: 7.9 mg/dL — AB (ref 8.4–10.5)
CHLORIDE: 93 meq/L — AB (ref 96–112)
CO2: 25 meq/L (ref 19–32)
Creatinine, Ser: 5.25 mg/dL — ABNORMAL HIGH (ref 0.50–1.35)
GFR calc non Af Amer: 11 mL/min — ABNORMAL LOW (ref >90)
GFR, EST AFRICAN AMERICAN: 13 mL/min — AB (ref >90)
GLUCOSE: 121 mg/dL — AB (ref 70–99)
POTASSIUM: 3.8 meq/L (ref 3.7–5.3)
Phosphorus: 5.3 mg/dL — ABNORMAL HIGH (ref 2.3–4.6)
SODIUM: 136 meq/L — AB (ref 137–147)

## 2013-11-13 LAB — CBC
HCT: 29.4 % — ABNORMAL LOW (ref 39.0–52.0)
Hemoglobin: 10.5 g/dL — ABNORMAL LOW (ref 13.0–17.0)
MCH: 26.2 pg (ref 26.0–34.0)
MCHC: 35.7 g/dL (ref 30.0–36.0)
MCV: 73.3 fL — ABNORMAL LOW (ref 78.0–100.0)
PLATELETS: 94 10*3/uL — AB (ref 150–400)
RBC: 4.01 MIL/uL — AB (ref 4.22–5.81)
RDW: 20 % — ABNORMAL HIGH (ref 11.5–15.5)
WBC: 9.7 10*3/uL (ref 4.0–10.5)

## 2013-11-13 LAB — MAGNESIUM: MAGNESIUM: 2 mg/dL (ref 1.5–2.5)

## 2013-11-13 MED ORDER — ZOLPIDEM TARTRATE 5 MG PO TABS
5.0000 mg | ORAL_TABLET | Freq: Every evening | ORAL | Status: DC | PRN
  Administered 2013-11-13: 5 mg via ORAL
  Filled 2013-11-13: qty 1

## 2013-11-13 MED ORDER — ALBUTEROL SULFATE HFA 108 (90 BASE) MCG/ACT IN AERS
1.0000 | INHALATION_SPRAY | Freq: Four times a day (QID) | RESPIRATORY_TRACT | Status: DC | PRN
  Administered 2013-11-13: 2 via RESPIRATORY_TRACT
  Filled 2013-11-13 (×2): qty 6.7

## 2013-11-13 MED ORDER — TRAZODONE 25 MG HALF TABLET
25.0000 mg | ORAL_TABLET | Freq: Every day | ORAL | Status: DC
  Administered 2013-11-13 – 2013-11-16 (×4): 25 mg via ORAL
  Filled 2013-11-13 (×6): qty 1

## 2013-11-13 MED ORDER — IPRATROPIUM-ALBUTEROL 0.5-2.5 (3) MG/3ML IN SOLN
3.0000 mL | Freq: Two times a day (BID) | RESPIRATORY_TRACT | Status: DC
  Filled 2013-11-13: qty 3

## 2013-11-13 NOTE — Progress Notes (Signed)
Pt pulled out his PIV and went outside to smoke.I had some concern about patients safety, but patient is none compliant and told me that he is a "bad boy" and left the floor with his walker.

## 2013-11-13 NOTE — Progress Notes (Signed)
[facility] Progress Note   Subjective: 1.9kg off with HD yest, pt signed off early. Lowest BP in HD was 110 syst.   Filed Vitals:   11/12/13 1543 11/12/13 1632 11/12/13 2100 11/13/13 0500  BP: 120/70 120/72 121/63 130/83  Pulse: 84 89 85 75  Temp: 98 F (36.7 C)  98.6 F (37 C) 98.2 F (36.8 C)  TempSrc: Oral     Resp: 20  16 16   Height:      Weight: 55.6 kg (122 lb 9.2 oz)   57.199 kg (126 lb 1.6 oz)  SpO2: 99% 93% 96% 100%  Exam:   Alert, no distress Clear bilat, no rales or wheezing  RRR no M or rub  Abd distended w ascites, nontender 1-2+ bilat LE edema  Alert, nf, ox3, no asterixis RUA AV graft is patent  CXR- clear   Outpatient Dialysis: MWF in [place], [place] (ph [phone])  4h   58.5kg    F160    2K/2.5Ca Bath    Heparin 3000  RUA AVGG Hectorol 2ug once per wk     EPO 8000 1x per wk  Assessment/Plan:  1. Afib with RVR: on dilt drip, HR 90's, taking ASA also 2. Ascites / Hep C: s/p paracentesis 3. Volume overload / LE edema: not much success with getting vol down, pt signing off early 4. ESRD: HD MWF. Usual HD [place], [place], pt is here without outpatient HD orders. He says he is going to be staying here in [place]; will need to start CLIP process 5. 2HPTH: CA ok, phos high 6.6, pth pending, cont vit D 2 ug once per week, started phoslo as binder 6. Anemia of CKD: Hb 10.6, cont esa with darbe 25/wk 7. COPD: stable 8. Hyperkalemia: resolved  [name] Keon Benscoter MD pager [phone]    cell [phone] 11/13/2013, 11:08 AM   Recent Labs Lab 11/11/13 0940 11/11/13 1628 11/11/13 2222 11/12/13 0513 11/13/13 0524  NA 137  --   --  137 136*  K 7.3* 3.6*  --  4.0 3.8  CL 88*  --   --  92* 93*  CO2 17*  --   --  23 25  GLUCOSE 67*  --   --  146* 121*  BUN 79*  --   --  43* 35*  CREATININE 8.72*  --   --  5.70* 5.25*  CALCIUM 9.4  --   --  8.4 7.9*  PHOS  --   --  6.6*  --  5.3*    Recent Labs Lab 11/11/13 0940 11/13/13 0524  AST 151*  --    ALT 88*  --   ALKPHOS 161*  --   BILITOT 3.7*  --   PROT 8.5*  --   ALBUMIN 3.5 2.4*    Recent Labs Lab 11/11/13 0940 11/12/13 0513 11/13/13 0524  WBC 11.3* 10.9* 9.7  NEUTROABS 7.8*  --   --   HGB 12.7* 10.6* 10.5*  HCT 35.9* 29.3* 29.4*  MCV 74.3* 72.7* 73.3*  PLT 113* 113* 94*   . albuterol  2.5 mg Nebulization BID  . aspirin EC  81 mg Oral Daily  . calcium acetate  1,334 mg Oral TID WC  . [START ON 11/14/2013] darbepoetin  25 mcg Intravenous Q Mon-HD  . [START ON 11/14/2013] doxercalciferol  2 mcg Intravenous Q Mon-HD  . heparin  5,000 Units Subcutaneous Q8H  . ipratropium  0.5 mg Nebulization BID  . metoprolol tartrate  50 mg Oral BID  .  sodium chloride  3 mL Intravenous Q12H     diltiazem, ondansetron (ZOFRAN) IV, ondansetron, zolpidem

## 2013-11-13 NOTE — Progress Notes (Signed)
Subjective:  [name] was seen and examined at bedside this morning. He was in a better mood and claims he is now ready to talk with social work in regards to placement. He wishes to establish here long term, will [facility] and [facility]. He had another session of HD yesterday.He reports feeling much better since admission, no more abdominal pain, nausea or vomiting. No chest pain or shortness of breath.   S/p paracentesis yesterday.   Objective: Vital signs in last 24 hours: Filed Vitals:   11/12/13 1543 11/12/13 1632 11/12/13 2100 11/13/13 0500  BP: 120/70 120/72 121/63 130/83  Pulse: 84 89 85 75  Temp: 98 F (36.7 C)  98.6 F (37 C) 98.2 F (36.8 C)  TempSrc: Oral     Resp: 20  16 16   Height:      Weight: 122 lb 9.2 oz (55.6 kg)   126 lb 1.6 oz (57.199 kg)  SpO2: 99% 93% 96% 100%   Weight change: 3 lb 12 oz (1.7 kg)  Intake/Output Summary (Last 24 hours) at 11/13/13 1554 Last data filed at 11/12/13 2100  Gross per 24 hour  Intake    240 ml  Output    200 ml  Net     40 ml   General: Sitting up in bed, NAD HEENT: PERRL, EOMI Cardiac: S1/S2, RRR Pulm:  CTA B/L  Abd: soft, +bs, non-tender to palpation Ext: b/l lower extremity edema, +1 pitting Musculoskeletal: Moving all four extremities, darkened lower extremities. RAVF.  Skin: Dry Neuro: Alert and oriented X3, strength equal in b/l extremities Psych: more calm today, hx of Bipolar disorder   Lab Results: Basic Metabolic Panel:  Recent Labs Lab 11/11/13 0940  11/11/13 2222 11/12/13 0513 11/13/13 0524  NA 137  --   --  137 136*  K 7.3*  < >  --  4.0 3.8  CL 88*  --   --  92* 93*  CO2 17*  --   --  23 25  GLUCOSE 67*  --   --  146* 121*  BUN 79*  --   --  43* 35*  CREATININE 8.72*  --   --  5.70* 5.25*  CALCIUM 9.4  --   --  8.4 7.9*  MG 2.6*  --   --   --  2.0  PHOS  --   --  6.6*  --  5.3*  < > = values in this interval not displayed. Liver Function Tests:  Recent Labs Lab  11/11/13 0940 11/13/13 0524  AST 151*  --   ALT 88*  --   ALKPHOS 161*  --   BILITOT 3.7*  --   PROT 8.5*  --   ALBUMIN 3.5 2.4*   CBC:  Recent Labs Lab 11/11/13 0940 11/12/13 0513 11/13/13 0524  WBC 11.3* 10.9* 9.7  NEUTROABS 7.8*  --   --   HGB 12.7* 10.6* 10.5*  HCT 35.9* 29.3* 29.4*  MCV 74.3* 72.7* 73.3*  PLT 113* 113* 94*   Cardiac Enzymes:  Recent Labs Lab 11/11/13 2222 11/12/13 0740  TROPONINI <0.30 <0.30   Hemoglobin A1C:  Recent Labs Lab 11/12/13 0513  HGBA1C 5.3   Fasting Lipid Panel:  Recent Labs Lab 11/12/13 0513  CHOL 92  HDL 13*  LDLCALC 63  TRIG 78  CHOLHDL 7.1   Micro Results: Recent Results (from the past 240 hour(s))  BODY FLUID CULTURE     Status: None  Collection Time    11/12/13 10:14 AM      Result Value Range Status   Specimen Description ASCITIC FLUID   Final   Special Requests NONE   Final   Gram Stain     Final   Value: WBC PRESENT,BOTH PMN AND MONONUCLEAR     NO ORGANISMS SEEN     CYTOSPIN Gram Stain Report Called to,Read Back By and Verified With: Gram Stain Report Called to,Read Back By and Verified With: COLE A  RN 11/12/13 1117 BY JONESJ Performed at [facility]     Performed at [facility]   Culture     Final   Value: NO GROWTH 1 DAY     Performed at [facility]   Report Status PENDING   Incomplete  GRAM STAIN     Status: None   Collection Time    11/12/13 10:14 AM      Result Value Range Status   Specimen Description ASCITIC FLUID   Final   Special Requests NONE   Final   Gram Stain     Final   Value: WBC PRESENT,BOTH PMN AND MONONUCLEAR     NO ORGANISMS SEEN     CYTOSPIN SLIDE     Gram Stain Report Called to,Read Back By and Verified With: COLE A.,RN 11/12/13 1117 BY JONESJ   Report Status 11/12/2013 FINAL   Final   Studies/Results: US Paracentesis  11/12/2013   CLINICAL DATA:  Abdominal pain, history of hepatitis-C. Ascites. Request diagnostic and therapeutic paracentesis.   EXAM: ULTRASOUND GUIDED PARACENTESIS  COMPARISON:  None.  PROCEDURE: An ultrasound guided paracentesis was thoroughly discussed with the patient and questions answered. The benefits, risks, alternatives and complications were also discussed. The patient understands and wishes to proceed with the procedure. Written consent was obtained.  Ultrasound was performed to localize and mark an adequate pocket of fluid in the right lower quadrant of the abdomen. The area was then prepped and draped in the normal sterile fashion. 1% Lidocaine was used for local anesthesia. Under ultrasound guidance a 19 gauge Yueh catheter was introduced. Paracentesis was performed. The catheter was removed and a dressing applied.  COMPLICATIONS: None immediate  FINDINGS: A total of approximately 1.3 L of clear, dark yellow fluid was removed. A fluid sample was sent for laboratory analysis.  IMPRESSION: Successful ultrasound guided paracentesis yielding 1.3 L of ascites.  Read by: Brayton El PA-C   Electronically Signed   By: Richarda Overlie M.D.   On: 11/12/2013 10:24   Medications: I have reviewed the patient's current medications. Scheduled Meds: . albuterol  2.5 mg Nebulization BID  . aspirin EC  81 mg Oral Daily  . calcium acetate  1,334 mg Oral TID WC  . [START ON 11/14/2013] darbepoetin  25 mcg Intravenous Q Mon-HD  . [START ON 11/14/2013] doxercalciferol  2 mcg Intravenous Q Mon-HD  . heparin  5,000 Units Subcutaneous Q8H  . ipratropium  0.5 mg Nebulization BID  . metoprolol tartrate  50 mg Oral BID  . sodium chloride  3 mL Intravenous Q12H   Continuous Infusions:  PRN Meds:.diltiazem, ondansetron (ZOFRAN) IV, ondansetron, zolpidem Assessment/Plan: Principal Problem:   Hyperkalemia Active Problems:   Substance abuse   PAF (paroxysmal atrial fibrillation)   COPD (chronic obstructive pulmonary disease)   Hep C w/ coma, chronic   ESRD on hemodialysis   Ascites  Assessment: [name] is a 58 year old man with ESRD on HD  who was admitted for hyperkalemia and  GI complaints.    Hyperkalemia - resolved with HD x2 sessions since admission and IV insulin and Kayelexate in ED . K down to 3.8 today.  -will continue to monitor electolytes  End Stage Renal Disease on HD - on MWF schedule. HD yesterday with 1.9kg off but signed off early. Weight up today 4 pounds: 122-126lbs.  Since he has left [place], he will need to step up with HD center here in [place]. Per renal, they will start CLIP process. -HD per renal, appreciate them following -PTH pending  -Continue phoslo, rena-vit, and vitamin D 2ug per week -Dialysis center search  Atrial Fibrillation with RVR--resolved, hx of afib but [facility] without any of his medications. Initially placed on Cardizem gtt but transitioned off to oral metoprolol yesterday and remains rate controlled.  Awaiting records from [place] in [place] to verify home medications.  -Continue metoprolol 50 mg BID and ASA 81mg  daily for now.   Hypertension --currently normotensive. Need to verify home medications -Stable currently on metoprolol 50 mg BID  Chronic liver cirrhosis with ascites - Most likely due to chronic Hepatitis C virus. Diagnostic and therapeutic (1.3L) paracentesis performed on 1/3 with no PMN>250 or single organism on culture to suggest SBP. LDH elevated at 142.  -Awaiting cytology of ascites fluid: no growth x1 day on fluid culture, no organisms seen on gram stain  Microcytic anemia in setting of CKD.  Hb stable.   -Continue ESA with darbepotein 25 weekly -Iron panel in AM  Thrombocytopenia - stable with no active bleeding or bruising. Pt presented with platelt count of 113K with unknown baseline. Most likely due to chronic liver cirrhosis.  -Continue to monitor CBC  COPD--stable, denies any recent exacerbations but does have chronic productive cough x1 month and continue to smoke cigarettes, marijuana, and used cocaine approximately 1 month ago.  -Albuterol  nebulizer PRN acute bronchospasm -Ipatropium nebulizer BID  Substance Abuse - Pt with history of cocaine use, marijuana, tobacco, and occasional alcohol.. -Encouraged cessation   Homelessness - Recently moved here from [place] and plans to stay. Does not want to live in ALF, because he says he cannot live with other people very well.  He does have some family in the area, his sons, however, he has not bee in touch with them for the past 5 years. He also does not wish to go to a shelter -Appreciate social work and [employer] assistance for [employer]  -Obtain medical records from [place]  Insomnia--has had trouble sleeping past two nights.  -started Ambien 5mg  PRN  Bipolar disorder--self reported, need to verify medications from [place].   Dispo: Disposition is deferred at this time, awaiting improvement of current medical problems.  Anticipated discharge in approximately 3-4 day(s).   The patient does not have a current PCP (No primary provider on file.) and does need an [facility] hospital follow-up appointment after discharge.  The patient does have transportation limitations that hinder transportation to clinic appointments.  Services Needed at time of discharge: Y = Yes, Blank = No PT:   OT:   RN:   Equipment:   Other:     LOS: 2 days   Darden Palmer, MD 11/13/2013, 3:54 PM

## 2013-11-14 ENCOUNTER — Encounter (HOSPITAL_COMMUNITY): Payer: Self-pay | Admitting: *Deleted

## 2013-11-14 DIAGNOSIS — E875 Hyperkalemia: Principal | ICD-10-CM

## 2013-11-14 LAB — RENAL FUNCTION PANEL
Albumin: 2.4 g/dL — ABNORMAL LOW (ref 3.5–5.2)
BUN: 54 mg/dL — ABNORMAL HIGH (ref 6–23)
CO2: 26 meq/L (ref 19–32)
Calcium: 8.2 mg/dL — ABNORMAL LOW (ref 8.4–10.5)
Chloride: 90 mEq/L — ABNORMAL LOW (ref 96–112)
Creatinine, Ser: 6.53 mg/dL — ABNORMAL HIGH (ref 0.50–1.35)
GFR, EST AFRICAN AMERICAN: 10 mL/min — AB (ref >90)
GFR, EST NON AFRICAN AMERICAN: 8 mL/min — AB (ref >90)
Glucose, Bld: 141 mg/dL — ABNORMAL HIGH (ref 70–99)
PHOSPHORUS: 3.9 mg/dL (ref 2.3–4.6)
Potassium: 4.3 mEq/L (ref 3.7–5.3)
SODIUM: 135 meq/L — AB (ref 137–147)

## 2013-11-14 LAB — PARATHYROID HORMONE, INTACT (NO CA): PTH: 618 pg/mL — ABNORMAL HIGH (ref 14.0–72.0)

## 2013-11-14 MED ORDER — HEPARIN SODIUM (PORCINE) 1000 UNIT/ML DIALYSIS: 3000.0000 [IU] | Freq: Once | INTRAMUSCULAR | Status: DC

## 2013-11-14 MED ORDER — SODIUM CHLORIDE 0.9 % IV SOLN: 100.0000 mL | INTRAVENOUS | Status: DC | PRN

## 2013-11-14 MED ORDER — BENZONATATE 100 MG PO CAPS
100.0000 mg | ORAL_CAPSULE | Freq: Two times a day (BID) | ORAL | Status: DC | PRN
  Filled 2013-11-14: qty 1

## 2013-11-14 MED ORDER — DIPHENHYDRAMINE HCL 25 MG PO CAPS
25.0000 mg | ORAL_CAPSULE | Freq: Once | ORAL | Status: AC
  Administered 2013-11-14: 25 mg via ORAL
  Filled 2013-11-14: qty 1

## 2013-11-14 MED ORDER — LIDOCAINE-PRILOCAINE 2.5-2.5 % EX CREA: 1.0000 "application " | TOPICAL_CREAM | CUTANEOUS | Status: DC | PRN

## 2013-11-14 MED ORDER — DARBEPOETIN ALFA-POLYSORBATE 25 MCG/0.42ML IJ SOLN
INTRAMUSCULAR | Status: AC
  Administered 2013-11-14: 25 ug via INTRAVENOUS
  Filled 2013-11-14: qty 0.42

## 2013-11-14 MED ORDER — LIDOCAINE HCL (PF) 1 % IJ SOLN: 5.0000 mL | INTRAMUSCULAR | Status: DC | PRN

## 2013-11-14 MED ORDER — PENTAFLUOROPROP-TETRAFLUOROETH EX AERO: 1.0000 "application " | INHALATION_SPRAY | CUTANEOUS | Status: DC | PRN

## 2013-11-14 MED ORDER — ALTEPLASE 2 MG IJ SOLR
2.0000 mg | Freq: Once | INTRAMUSCULAR | Status: DC | PRN
  Filled 2013-11-14: qty 2

## 2013-11-14 MED ORDER — PRAZOSIN HCL 2 MG PO CAPS
2.0000 mg | ORAL_CAPSULE | Freq: Every day | ORAL | Status: DC
  Filled 2013-11-14: qty 1

## 2013-11-14 MED ORDER — PRAZOSIN HCL 2 MG PO CAPS
4.0000 mg | ORAL_CAPSULE | Freq: Every day | ORAL | Status: DC
  Administered 2013-11-14 – 2013-11-16 (×3): 4 mg via ORAL
  Filled 2013-11-14 (×4): qty 2

## 2013-11-14 MED ORDER — HEPARIN SODIUM (PORCINE) 1000 UNIT/ML DIALYSIS: 1000.0000 [IU] | INTRAMUSCULAR | Status: DC | PRN

## 2013-11-14 MED ORDER — DOXERCALCIFEROL 4 MCG/2ML IV SOLN
INTRAVENOUS | Status: AC
  Administered 2013-11-14: 2 ug via INTRAVENOUS
  Filled 2013-11-14: qty 2

## 2013-11-14 MED ORDER — NEPRO/CARBSTEADY PO LIQD: 237.0000 mL | ORAL | Status: DC | PRN

## 2013-11-14 NOTE — Procedures (Signed)
I have personally attended this patient's dialysis session.   Right upper arm AVG 400 2K2.25 Ca bath (K 4.3) Tolerating TMT thus far. 2+ edema of LE's    Moriah Shawley B
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
## 2013-11-14 NOTE — Progress Notes (Signed)
  Date: 11/14/2013  Patient name: Carl Benson  Medical record number: [id number]  Date of birth: 12/20/1955   This patient has been seen and the plan of care was discussed with the house staff. Please see their note for complete details. I concur with their findings.  Inez Catalina, MD 11/14/2013, 4:45 PM

## 2013-11-14 NOTE — Progress Notes (Addendum)
Subjective:  Pt seen and examined in AM in HD. No acute events overnight. Pt reports feeling well. Complaining of dry cough. Abdominal distension and diffuse pain has improved. He denies fever, chills, nausea, vomiting, change in BM or urination.   Objective: Vital signs in last 24 hours: Filed Vitals:   11/14/13 1130 11/14/13 1200 11/14/13 1230 11/14/13 1300  BP: 158/107 158/104 151/93 163/102  Pulse: 81 83 81 84  Temp:      TempSrc:      Resp: 18 20 20 21   Height:      Weight:      SpO2:       Weight change: 0.96 kg (2 lb 1.9 oz)  Intake/Output Summary (Last 24 hours) at 11/14/13 1310 Last data filed at 11/13/13 2152  Gross per 24 hour  Intake    236 ml  Output      0 ml  Net    236 ml   General: Not in acute distress.  HEENT: PERRL, EOMI, no scleral icterus, No JVD or bruit  Cardiac: S1/S2, RRR, No murmurs, gallops or rubs  Pulm:  No rales, wheezing, or rubs.  Abd: moderately distended, diffused mild tender, no rebound pain, no organomegaly, BS present  Ext: trace leg edema bilaterally  Musculoskeletal: No joint deformities, erythema, or stiffness, ROM full  Skin: No rashes.  Neuro: Alert and oriented X3.  Psych: Cooperative   Lab Results: Basic Metabolic Panel:  Recent Labs Lab 11/11/13 0940  11/13/13 0524 11/14/13 0510  NA 137  < > 136* 135*  K 7.3*  < > 3.8 4.3  CL 88*  < > 93* 90*  CO2 17*  < > 25 26  GLUCOSE 67*  < > 121* 141*  BUN 79*  < > 35* 54*  CREATININE 8.72*  < > 5.25* 6.53*  CALCIUM 9.4  < > 7.9* 8.2*  MG 2.6*  --  2.0  --   PHOS  --   < > 5.3* 3.9  < > = values in this interval not displayed. Liver Function Tests:  Recent Labs Lab 11/11/13 0940 11/13/13 0524 11/14/13 0510  AST 151*  --   --   ALT 88*  --   --   ALKPHOS 161*  --   --   BILITOT 3.7*  --   --   PROT 8.5*  --   --   ALBUMIN 3.5 2.4* 2.4*   No results found for this basename: LIPASE, AMYLASE,  in the last 168 hours No results found for this basename: AMMONIA,   in the last 168 hours CBC:  Recent Labs Lab 11/11/13 0940 11/12/13 0513 11/13/13 0524  WBC 11.3* 10.9* 9.7  NEUTROABS 7.8*  --   --   HGB 12.7* 10.6* 10.5*  HCT 35.9* 29.3* 29.4*  MCV 74.3* 72.7* 73.3*  PLT 113* 113* 94*   Cardiac Enzymes:  Recent Labs Lab 11/11/13 2222 11/12/13 0740  TROPONINI <0.30 <0.30   BNP: No results found for this basename: PROBNP,  in the last 168 hours D-Dimer: No results found for this basename: DDIMER,  in the last 168 hours CBG: No results found for this basename: GLUCAP,  in the last 168 hours Hemoglobin A1C:  Recent Labs Lab 11/12/13 0513  HGBA1C 5.3   Fasting Lipid Panel:  Recent Labs Lab 11/12/13 0513  CHOL 92  HDL 13*  LDLCALC 63  TRIG 78  CHOLHDL 7.1   Thyroid Function Tests: No results found for this basename:  TSH, T4TOTAL, FREET4, T3FREE, THYROIDAB,  in the last 168 hours Coagulation: No results found for this basename: LABPROT, INR,  in the last 168 hours Anemia Panel: No results found for this basename: VITAMINB12, FOLATE, FERRITIN, TIBC, IRON, RETICCTPCT,  in the last 168 hours Urine Drug Screen: Drugs of Abuse  No results found for this basename: labopia,  cocainscrnur,  labbenz,  amphetmu,  thcu,  labbarb    Alcohol Level: No results found for this basename: ETH,  in the last 168 hours Urinalysis: No results found for this basename: COLORURINE, APPERANCEUR, LABSPEC, PHURINE, GLUCOSEU, HGBUR, BILIRUBINUR, KETONESUR, PROTEINUR, UROBILINOGEN, NITRITE, LEUKOCYTESUR,  in the last 168 hours   Micro Results: Recent Results (from the past 240 hour(s))  BODY FLUID CULTURE     Status: None   Collection Time    11/12/13 10:14 AM      Result Value Range Status   Specimen Description ASCITIC FLUID   Final   Special Requests NONE   Final   Gram Stain     Final   Value: WBC PRESENT,BOTH PMN AND MONONUCLEAR     NO ORGANISMS SEEN     CYTOSPIN Gram Stain Report Called to,Read Back By and Verified With: Gram Stain  Report Called to,Read Back By and Verified With: COLE A  RN 11/12/13 1117 BY JONESJ Performed at [facility]     Performed at [facility]   Culture     Final   Value: NO GROWTH 1 DAY     Performed at [facility]   Report Status PENDING   Incomplete  GRAM STAIN     Status: None   Collection Time    11/12/13 10:14 AM      Result Value Range Status   Specimen Description ASCITIC FLUID   Final   Special Requests NONE   Final   Gram Stain     Final   Value: WBC PRESENT,BOTH PMN AND MONONUCLEAR     NO ORGANISMS SEEN     CYTOSPIN SLIDE     Gram Stain Report Called to,Read Back By and Verified With: COLE A.,RN 11/12/13 1117 BY JONESJ   Report Status 11/12/2013 FINAL   Final   Studies/Results: No results found. Medications: I have reviewed the patient's current medications. Scheduled Meds: . aspirin EC  81 mg Oral Daily  . calcium acetate  1,334 mg Oral TID WC  . darbepoetin  25 mcg Intravenous Q Mon-HD  . doxercalciferol  2 mcg Intravenous Q Mon-HD  . [START ON 11/15/2013] heparin  3,000 Units Dialysis Once in dialysis  . heparin  5,000 Units Subcutaneous Q8H  . metoprolol tartrate  50 mg Oral BID  . sodium chloride  3 mL Intravenous Q12H  . traZODone  25 mg Oral QHS   Continuous Infusions:  PRN Meds:.sodium chloride, sodium chloride, albuterol, alteplase, benzonatate, diltiazem, feeding supplement (NEPRO CARB STEADY), heparin, lidocaine (PF), lidocaine-prilocaine, ondansetron (ZOFRAN) IV, ondansetron, pentafluoroprop-tetrafluoroeth Assessment/Plan: Principal Problem:   Hyperkalemia Active Problems:   COPD (chronic obstructive pulmonary disease)   Hep C w/ coma, chronic   ESRD on hemodialysis   Ascites   Substance abuse   PAF (paroxysmal atrial fibrillation)   HTN (hypertension)   Cirrhosis of liver due to hepatitis C   PTSD (post-traumatic stress disorder)   Depression  Assessment: 58 year old man with ESRD on HD who presented on 1/2 with abdominal  distension and pain and found to have hyperkalemia.    End Stage Renal Disease on HD -  on MWF schedule -Monitor wt (122 to 134) and I & Os (not recorded yesterday) -PTH --> elevated at 618, corrected calcium WNL, Phosp WNL  -Continue phoslo, rena-vit, vitamin D 2ug per week -Dialysis center search -Nephrology following  Atrial Fibrillation  - currently normal sinus rhythm  -Continue metoprolol 50 mg BID -Continue 81 mg aspirin daily  Hypertension - currently hypertensive   -Continue metoprolol 50 mg BID  Chronic liver cirrhosis with ascites - Most likely due to chronic Hepatitis C virus. Diagnostic and therapeutic (1.3L) paracentesis performed on 1/3 with no PMN>250 or single organism on culture to suggest SBP    -Awaiting cytology of ascites fluid -Consider diuretic therapy  Microcytic anemia -stable H/H without active bleeding or hemodynamic instability. Most likely due to CKD.   -Continue to monitor  -Continue ESA with darbepotein 25 weekly -Continue hectorol with HD  Thrombocytopenia - stable with no active bleeding or bruising. Pt presented with platelt count of 113K with unknown baseline. Most likely due to chronic liver cirrhosis.  -Continue to monitor CBC  COPD - no recent exacerbations requiring steroid use. Pt with reported productive cough for 1 month and mild dyspnea on admission, with no wheezing or chest tightness. Pt on unknown inhalers and chronic heavy smoker with 50 pack history.  -Albuterol nebulizer PRN acute bronchospasm -Ipatropium nebulizer BID  Substance Abuse - Pt with history of cocaine use. Also cigarette smoker (1 pack 50 yrs). -Encourage drug cessation   Homelessness - Pt recently moved from [place] and currently homeless, however per pt he receives money monthly. Has 2 sons in town however has not had contact in past 5 years. States he does not want to go to a shelter.  -SW consult today -[place]  Insomnia--has  had trouble sleeping past two nights  -Ambien 5mg  PRN -Prazosin 4 mg at night for nightmares -Trazodone 25 mg at night  Dispo: Disposition is deferred at this time, awaiting improvement of current medical problems.  Anticipated discharge in approximately 3 day(s).   The patient does not have a current PCP (No primary provider on file.) and does need [facility] follow-up appointment after discharge.  The patient does have transportation limitations that hinder transportation to clinic appointments.  .Services Needed at time of discharge: Y = Yes, Blank = No PT:   OT:   RN:   Equipment:   Other:     LOS: 3 days   Otis BraceMarjan Gwynn Chalker, MD 11/14/2013, 1:10 PM

## 2013-11-14 NOTE — ED Provider Notes (Signed)
Medical screening examination/treatment/procedure(s) were conducted as a shared visit with non-physician practitioner(s) and myself.  I personally evaluated the patient during the encounter.  EKG Interpretation    Date/Time:  Friday November 11 2013 17:07:26 EST Ventricular Rate:  157 PR Interval:    QRS Duration: 98 QT Interval:  314 QTC Calculation: 507 R Axis:   -38 Text Interpretation:  Atrial flutter with variable A-V block Left axis deviation Left ventricular hypertrophy with repolarization abnormality Abnormal ECG ED PHYSICIAN INTERPRETATION AVAILABLE IN [facility] Confirmed by TEST, RECORD ([id number]) on 11/13/2013 9:36:13 AM            Patient with hyperkalemia from missed dialysis. Stable, no EKG changes. Abd exam is soft w/o focal tenderness, feel SBP is unlikely. He is requesting therapeutic paracentesis. Will admit for both paracentesis and dialysis.  Audree Camel, MD 11/14/13 [id number]

## 2013-11-14 NOTE — Progress Notes (Signed)
[facility] Progress Note   Subjective: Seen in dialysis - initiating treatment Says he needs a place to stay but will "not be with family, not in a shelter and not in the hood"   Filed Vitals:   11/12/13 2100 11/13/13 0500 11/13/13 2100 11/14/13 0500  BP: 121/63 130/83 145/92 157/102  Pulse: 85 75 86 86  Temp: 98.6 F (37 C) 98.2 F (36.8 C) 98.7 F (37.1 C) 99.5 F (37.5 C)  TempSrc:      Resp: 16 16 18 18   Height:      Weight:  57.199 kg (126 lb 1.6 oz)  58.06 kg (128 lb)  SpO2: 96% 100% 99% 100%  Exam:   Alert, no distress Clear bilat, no rales or wheezing  RRR no M or rub  Abd distended w ascites, nontender and fairly soft despite distension 1-2+ bilat LE edema  Alert, nf, ox3, no asterixis RUA AV graft is patent with + bruit and thrill  CXR- clear   Outpatient Dialysis: MWF in [place] (ph [phone])  4h   58.5kg    F160    2K/2.5Ca Bath    Heparin 3000  RUA AVGG Hectorol 2ug once per wk     EPO 8000 1x per wk  Assessment/Plan:  1. Afib with RVR: rate controlled now; on metoprolol 2. Ascites / Hep C: s/p paracentesis (1/3 liters; 1/3) 3. Volume overload / LE edema: not much success with getting vol down, pt signing off early; dialysis today on his prev outpt schedule 4. ESRD: HD MWF. Usual HD [place], [place], pt is here without outpatient HD orders or place to dialyze. He says he is going to be staying here in [place] but no home as of yet; will start CLIP process 5. 2HPTH: CA ok, phos high 6.6, pth pending, cont vit D 2 ug once per week, started phoslo as binder 6. Anemia of CKD: Hb 10.6, cont esa with darbe 25/wk 7. COPD: stable 8. Hyperkalemia: resolved 9. Social - biggest issue will be to secure living arrangements as we cannot do anything about outpt HD until he has a place to live; says he cannot stay with family, refuses to go to a shelter - "I have money"    Recent Labs Lab 11/11/13 2222 11/12/13 0513 11/13/13 0524  11/14/13 0510  NA  --  137 136* 135*  K  --  4.0 3.8 4.3  CL  --  92* 93* 90*  CO2  --  23 25 26   GLUCOSE  --  146* 121* 141*  BUN  --  43* 35* 54*  CREATININE  --  5.70* 5.25* 6.53*  CALCIUM  --  8.4 7.9* 8.2*  PHOS 6.6*  --  5.3* 3.9    Recent Labs Lab 11/11/13 0940 11/13/13 0524 11/14/13 0510  AST 151*  --   --   ALT 88*  --   --   ALKPHOS 161*  --   --   BILITOT 3.7*  --   --   PROT 8.5*  --   --   ALBUMIN 3.5 2.4* 2.4*    Recent Labs Lab 11/11/13 0940 11/12/13 0513 11/13/13 0524  WBC 11.3* 10.9* 9.7  NEUTROABS 7.8*  --   --   HGB 12.7* 10.6* 10.5*  HCT 35.9* 29.3* 29.4*  MCV 74.3* 72.7* 73.3*  PLT 113* 113* 94*   Medications: . aspirin EC  81 mg Oral Daily  . calcium acetate  1,334 mg Oral TID WC  .  darbepoetin  25 mcg Intravenous Q Mon-HD  . doxercalciferol  2 mcg Intravenous Q Mon-HD  . [START ON 11/15/2013] heparin  3,000 Units Dialysis Once in dialysis  . heparin  5,000 Units Subcutaneous Q8H  . metoprolol tartrate  50 mg Oral BID  . sodium chloride  3 mL Intravenous Q12H  . traZODone  25 mg Oral QHS     sodium chloride, sodium chloride, albuterol, alteplase, diltiazem, feeding supplement (NEPRO CARB STEADY), heparin, lidocaine (PF), lidocaine-prilocaine, ondansetron (ZOFRAN) IV, ondansetron, pentafluoroprop-tetrafluoroeth

## 2013-11-14 NOTE — Progress Notes (Signed)
Utilization review completed.  

## 2013-11-15 LAB — RENAL FUNCTION PANEL
ALBUMIN: 2.3 g/dL — AB (ref 3.5–5.2)
BUN: 39 mg/dL — ABNORMAL HIGH (ref 6–23)
CALCIUM: 8.2 mg/dL — AB (ref 8.4–10.5)
CHLORIDE: 93 meq/L — AB (ref 96–112)
CO2: 25 mEq/L (ref 19–32)
CREATININE: 5.14 mg/dL — AB (ref 0.50–1.35)
GFR calc Af Amer: 13 mL/min — ABNORMAL LOW (ref >90)
GFR, EST NON AFRICAN AMERICAN: 11 mL/min — AB (ref >90)
Glucose, Bld: 101 mg/dL — ABNORMAL HIGH (ref 70–99)
Phosphorus: 4.2 mg/dL (ref 2.3–4.6)
Potassium: 4.2 mEq/L (ref 3.7–5.3)
Sodium: 136 mEq/L — ABNORMAL LOW (ref 137–147)

## 2013-11-15 MED ORDER — DOXERCALCIFEROL 4 MCG/2ML IV SOLN
2.0000 ug | INTRAVENOUS | Status: DC
  Administered 2013-11-16: 2 ug via INTRAVENOUS
  Filled 2013-11-15: qty 2

## 2013-11-15 NOTE — Progress Notes (Signed)
  Date: 11/15/2013  Patient name: Carl Benson  Medical record number: [id number]  Date of birth: June 18, 1956   This patient has been seen and the plan of care was discussed with the house staff.  Dr. Thornell Sartorius note is pending at this time.  Please see her complete note for complete details of the plan once completed.    Briefly, Carl Benson medical course has been complicated by need for dialysis.  He received dialysis Monday and was feeling better this AM.  He does not have a local HD outpatient center or home in the area.  CM and SW have been working with the team to establish an address and help Carl Benson set up outpatient HD.  Carl Benson has a hotel choice made for an address.  Best medical plan at the moment will be to receive HD here tomorrow and be discharged to establish residence and then establish an outpatient HD site.  Other chronic issues include Afib, HTN, cirrhosis.  These are being monitored daily, however, Carl Benson appears to be back to his baseline health.    Inez Catalina, MD 11/15/2013, 4:12 PM

## 2013-11-15 NOTE — Progress Notes (Signed)
[facility] Progress Note   Subjective: Had dialysis yesterday Signed off early due to cramping; wt to 58.2 (EDW 58.5) but still w/some edema Says he needs a place to stay but will "not be with family, not in a shelter and not in the hood" [facility] Case manager told dialysis CLIP manager that "there was nothing she could do"  Filed Vitals:   11/14/13 1300 11/14/13 1306 11/14/13 1429 11/14/13 2124  BP: 163/102 175/98 184/131 157/80  Pulse: 84 80 93 91  Temp:  98.2 F (36.8 C) 97.4 F (36.3 C) 98.7 F (37.1 C)  TempSrc:  Oral Oral Oral  Resp: 21 20 20 20   Height:      Weight:  58.2 kg (128 lb 4.9 oz)    SpO2:  99% 99% 98%   Weight trending: 11/14/13 1306 58.2 kg (128 lb 4.9 oz)   Post HD 11/14/13 0958 61.2 kg (134 lb 14.7 oz) pre HD  Exam:   Alert, no distress Clear bilat, no rales or wheezing  RRR no M or rub  Abd distended w ascites, nontender and soft despite distension 1+ edema LE's Alert, nf, ox3, no asterixis RUA AV graft is patent with + bruit and thrill  Outpatient Dialysis[place] (ph [phone])  4h   58.5kg (trying to lower in hosp)    F160    2K/2.5Ca Bath    Heparin 3000  RUA AVGG Hectorol 2ug once per wk     EPO 8000 1x per wk  Assessment/Plan:  1. Afib with RVR: rate controlled now; on metoprolol 2. Ascites / Hep C: s/p paracentesis (1/3 liters; 1/3) Still with ascites but not tense 3. Volume overload / LE edema: just below "EDW" Still some edema. Try to lower a bit more tomorrow 4. ESRD: HD MWF. Usual HD [place], [place], pt is here without outpatient HD orders or place to dialyze. He says he is going to be staying here in [place] but no home as of yet; cannot start CLIP process until he has a place to stay and at this time he has no place to live, has made no contact with family, and SW says they cannot help (per dialysis staff - no note on chart to that effect) 5. 2HPTH:  Continue hectorol but  increase to TIW  (PTH 618); phos normal now on binder 6. Anemia of CKD: Hb 10's and stable cont esa with darbe 25/wk 7. COPD: stable 8. Hyperkalemia: resolved 9. Social - biggest issue will be to secure living arrangements as we cannot do anything about outpt HD until he has a place to live   Samina Weekes B, MD  Additional objective data  Recent Labs Lab 11/13/13 0524 11/14/13 0510 11/15/13 0435  NA 136* 135* 136*  K 3.8 4.3 4.2  CL 93* 90* 93*  CO2 25 26 25   GLUCOSE 121* 141* 101*  BUN 35* 54* 39*  CREATININE 5.25* 6.53* 5.14*  CALCIUM 7.9* 8.2* 8.2*  PHOS 5.3* 3.9 4.2    Recent Labs Lab 11/11/13 0940 11/13/13 0524 11/14/13 0510 11/15/13 0435  AST 151*  --   --   --   ALT 88*  --   --   --   ALKPHOS 161*  --   --   --   BILITOT 3.7*  --   --   --   PROT 8.5*  --   --   --   ALBUMIN 3.5 2.4* 2.4* 2.3*  Recent Labs Lab 11/11/13 0940 11/12/13 0513 11/13/13 0524  WBC 11.3* 10.9* 9.7  NEUTROABS 7.8*  --   --   HGB 12.7* 10.6* 10.5*  HCT 35.9* 29.3* 29.4*  MCV 74.3* 72.7* 73.3*  PLT 113* 113* 94*   Lab Results  Component Value Date   PTH 618.0* 11/11/2013   CALCIUM 8.2* 11/15/2013   PHOS 4.2 11/15/2013   Medications: . aspirin EC  81 mg Oral Daily  . calcium acetate  1,334 mg Oral TID WC  . darbepoetin  25 mcg Intravenous Q Mon-HD  . doxercalciferol  2 mcg Intravenous Q Mon-HD  . heparin  5,000 Units Subcutaneous Q8H  . metoprolol tartrate  50 mg Oral BID  . prazosin  4 mg Oral QHS  . sodium chloride  3 mL Intravenous Q12H  . traZODone  25 mg Oral QHS     albuterol, benzonatate, ondansetron (ZOFRAN) IV, ondansetron

## 2013-11-15 NOTE — Progress Notes (Signed)
Subjective:  Pt seen and examined in AM.  No acute events overnight. Pt reports feeling well.  He reports improved abdominal distension and pain. He denies fever, chills, dyspnea, chest pain, lightheadedness, headache, nausea, vomiting, change in BM or urination. He states that he will go to a hotel and receives adequate money every month.   Objective: Vital signs in last 24 hours: Filed Vitals:   11/14/13 1300 11/14/13 1306 11/14/13 1429 11/14/13 2124  BP: 163/102 175/98 184/131 157/80  Pulse: 84 80 93 91  Temp:  98.2 F (36.8 C) 97.4 F (36.3 C) 98.7 F (37.1 C)  TempSrc:  Oral Oral Oral  Resp: 21 20 20 20   Height:      Weight:  58.2 kg (128 lb 4.9 oz)    SpO2:  99% 99% 98%   Weight change: 3.139 kg (6 lb 14.7 oz) No intake or output data in the 24 hours ending 11/15/13 1704 General: Not in acute distress  HEENT:  EOMI   Cardiac: S1/S2, RRR, No murmurs, gallops or rubs  Pulm:  No rales, wheezing, or rubs.  Abd: mildly distended, no abdominal tenderness, or rebound pain. BS present  Ext: +1 leg edema bilaterally  Musculoskeletal: normal ROM  Skin: No rashes.  Neuro: Alert and oriented X3.  Psych: Cooperative   Lab Results: Basic Metabolic Panel:  Recent Labs Lab 11/11/13 0940  11/13/13 0524 11/14/13 0510 11/15/13 0435  NA 137  < > 136* 135* 136*  K 7.3*  < > 3.8 4.3 4.2  CL 88*  < > 93* 90* 93*  CO2 17*  < > 25 26 25   GLUCOSE 67*  < > 121* 141* 101*  BUN 79*  < > 35* 54* 39*  CREATININE 8.72*  < > 5.25* 6.53* 5.14*  CALCIUM 9.4  < > 7.9* 8.2* 8.2*  MG 2.6*  --  2.0  --   --   PHOS  --   < > 5.3* 3.9 4.2  < > = values in this interval not displayed. Liver Function Tests:  Recent Labs Lab 11/11/13 0940  11/14/13 0510 11/15/13 0435  AST 151*  --   --   --   ALT 88*  --   --   --   ALKPHOS 161*  --   --   --   BILITOT 3.7*  --   --   --   PROT 8.5*  --   --   --   ALBUMIN 3.5  < > 2.4* 2.3*  < > = values in this interval not displayed. No results  found for this basename: LIPASE, AMYLASE,  in the last 168 hours No results found for this basename: AMMONIA,  in the last 168 hours CBC:  Recent Labs Lab 11/11/13 0940 11/12/13 0513 11/13/13 0524  WBC 11.3* 10.9* 9.7  NEUTROABS 7.8*  --   --   HGB 12.7* 10.6* 10.5*  HCT 35.9* 29.3* 29.4*  MCV 74.3* 72.7* 73.3*  PLT 113* 113* 94*   Cardiac Enzymes:  Recent Labs Lab 11/11/13 2222 11/12/13 0740  TROPONINI <0.30 <0.30   BNP: No results found for this basename: PROBNP,  in the last 168 hours D-Dimer: No results found for this basename: DDIMER,  in the last 168 hours CBG: No results found for this basename: GLUCAP,  in the last 168 hours Hemoglobin A1C:  Recent Labs Lab 11/12/13 0513  HGBA1C 5.3   Fasting Lipid Panel:  Recent Labs Lab 11/12/13 0513  CHOL  92  HDL 13*  LDLCALC 63  TRIG 78  CHOLHDL 7.1   Thyroid Function Tests: No results found for this basename: TSH, T4TOTAL, FREET4, T3FREE, THYROIDAB,  in the last 168 hours Coagulation: No results found for this basename: LABPROT, INR,  in the last 168 hours Anemia Panel: No results found for this basename: VITAMINB12, FOLATE, FERRITIN, TIBC, IRON, RETICCTPCT,  in the last 168 hours Urine Drug Screen: Drugs of Abuse  No results found for this basename: labopia,  cocainscrnur,  labbenz,  amphetmu,  thcu,  labbarb    Alcohol Level: No results found for this basename: ETH,  in the last 168 hours Urinalysis: No results found for this basename: COLORURINE, APPERANCEUR, LABSPEC, PHURINE, GLUCOSEU, HGBUR, BILIRUBINUR, KETONESUR, PROTEINUR, UROBILINOGEN, NITRITE, LEUKOCYTESUR,  in the last 168 hours   Micro Results: Recent Results (from the past 240 hour(s))  BODY FLUID CULTURE     Status: None   Collection Time    11/12/13 10:14 AM      Result Value Range Status   Specimen Description ASCITIC FLUID   Final   Special Requests NONE   Final   Gram Stain     Final   Value: WBC PRESENT,BOTH PMN AND MONONUCLEAR       NO ORGANISMS SEEN     CYTOSPIN Gram Stain Report Called to,Read Back By and Verified With: Gram Stain Report Called to,Read Back By and Verified With: COLE A  RN 11/12/13 1117 BY JONESJ Performed at [facility]      Performed at [facility]   Culture     Final   Value: NO GROWTH 3 DAYS     Performed at [facility]   Report Status PENDING   Incomplete  GRAM STAIN     Status: None   Collection Time    11/12/13 10:14 AM      Result Value Range Status   Specimen Description ASCITIC FLUID   Final   Special Requests NONE   Final   Gram Stain     Final   Value: WBC PRESENT,BOTH PMN AND MONONUCLEAR     NO ORGANISMS SEEN     CYTOSPIN SLIDE     Gram Stain Report Called to,Read Back By and Verified With: COLE A.,RN 11/12/13 1117 BY JONESJ   Report Status 11/12/2013 FINAL   Final   Studies/Results: No results found. Medications: I have reviewed the patient's current medications. Scheduled Meds: . aspirin EC  81 mg Oral Daily  . calcium acetate  1,334 mg Oral TID WC  . darbepoetin  25 mcg Intravenous Q Mon-HD  . [START ON 11/16/2013] doxercalciferol  2 mcg Intravenous Q M,W,F-HD  . heparin  5,000 Units Subcutaneous Q8H  . metoprolol tartrate  50 mg Oral BID  . prazosin  4 mg Oral QHS  . sodium chloride  3 mL Intravenous Q12H  . traZODone  25 mg Oral QHS   Continuous Infusions:  PRN Meds:.albuterol, benzonatate, ondansetron (ZOFRAN) IV, ondansetron Assessment/Plan: Principal Problem:   Hyperkalemia Active Problems:   COPD (chronic obstructive pulmonary disease)   Hep C w/ coma, chronic   ESRD on hemodialysis   Ascites   Substance abuse   PAF (paroxysmal atrial fibrillation)   HTN (hypertension)   Cirrhosis of liver due to hepatitis C   PTSD (post-traumatic stress disorder)   Depression  Assessment: 58 year old man with ESRD on HD who presented on 1/2 with abdominal distension and pain and found to have hyperkalemia in setting of ESRD.  Homelessness - Pt recently moved from [place] and currently homeless, however per pt he receives money monthly. Has 2 sons in town however has not had contact in past 5 years. States he does not want to go to a shelter.  -SW/CM --> would like to go to hotel temporarily until can find permanent housing, will need transportation to and from HD  -Per nephrology Dr. Eliott Nine --> search for HD in place (temporary address given), will likely take 48 hrs  -Obtained medical records from [facility]  End Stage Renal Disease on HD - on MWF schedule, currently without HD outpatient center  -HD yesterday, signed off early due to cramping -Monitor wt (122 to 128) and I & Os (2.3L yesterday) -Continue rena-vit -Dialysis center search in process -Nephrology following  Secondary Hyperparathyroidism - pt with rlevated PTH, corrected calcium & phosp wnl  -Continue phoslo,hectorol,  vitamin D 2ug per week  Atrial Fibrillation  - currently normal sinus rhythm  -Continue metoprolol 50 mg BID -Continue 81 mg aspirin daily  Hypertension - currently hypertensive   -Continue metoprolol 50 mg BID  Chronic liver cirrhosis with ascites - Most likely due to chronic Hepatitis C virus. Diagnostic and therapeutic (1.3L) paracentesis performed on 1/3 with no PMN>250 or single organism on culture to suggest SBP    -Awaiting cytology of ascites fluid form 11/11/13 --> Reactive appearing mesothelial cells -Consider diuretic therapy  Microcytic anemia -stable H/H without active bleeding or hemodynamic instability. Most likely due to CKD.   -Continue to monitor for bleeding  -Continue ESA and darbepotein 25 weekly  Thrombocytopenia - stable with no active bleeding or bruising. Pt presented with platelt count of 113K with unknown baseline. Most likely due to chronic liver cirrhosis.  -Continue to monitor for bleeding   COPD - no recent exacerbations requiring steroid use. Pt with reported productive cough for 1 month  and mild dyspnea on admission, with no wheezing or chest tightness. Pt on unknown inhalers and chronic heavy smoker with 50 pack history.  -Albuterol nebulizer PRN acute bronchospasm -Ipatropium nebulizer BID  Substance Abuse - Pt with history of cocaine use. Also cigarette smoker (1 pack 50 yrs). -Encourage drug cessation   Insomnia--has had trouble sleeping past two nights  -Ambien 5mg  PRN -Continue home prazosin 4 mg at night for nightmares -Continue home trazodone 25 mg at night  Dispo: Disposition is deferred at this time, awaiting improvement of current medical problems.  Anticipated discharge in approximately 3 day(s).   The patient does not have a current PCP (No primary provider on file.) and does need an [facility] hospital follow-up appointment after discharge.  The patient does have transportation limitations that hinder transportation to clinic appointments.  .Services Needed at time of discharge: Y = Yes, Blank = No PT:   OT:   RN:   Equipment:   Other:     LOS: 4 days   Otis Brace, MD 11/15/2013, 5:04 PM

## 2013-11-15 NOTE — Progress Notes (Signed)
CSW (Clinical Child psychotherapist) asked to speak with pt regarding housing. Pt was staying at a [place] facility in [place] prior to returning to [place] and being admitted to the hospital. Pt reports his plan was to contact family and stay with them however he has not been able to reach anyone as of yet. CSW inquired as to whether or not pt had any income. Pt reports having a sizeable income as well as money available now at his bank. CSW offered pt list of apartments available within price range discussed between CSW and pt. CSW did inquire as to what pt immediate plan will be as apartment list will help down the road not necessarily right this minute. Pt reported he planned on finding a hotel/motel. Pt stated he will also continue to try and contact family. Pt not needing shelter list at this time as he has income necessary to find alternate housing. Pt stated he was comfortable with dc plan to find family or hotel and use provided list to search for an apartment after discharge. CSW received call from MD and informed of conversation with pt. At this time no further CSW needs. CSW signing off.  Jaquelyne Firkus, LCSWA [phone]
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
## 2013-11-16 LAB — CBC
HCT: 28.6 % — ABNORMAL LOW (ref 39.0–52.0)
Hemoglobin: 9.9 g/dL — ABNORMAL LOW (ref 13.0–17.0)
MCH: 26.3 pg (ref 26.0–34.0)
MCHC: 34.6 g/dL (ref 30.0–36.0)
MCV: 75.9 fL — ABNORMAL LOW (ref 78.0–100.0)
PLATELETS: 79 10*3/uL — AB (ref 150–400)
RBC: 3.77 MIL/uL — ABNORMAL LOW (ref 4.22–5.81)
RDW: 21.2 % — AB (ref 11.5–15.5)
WBC: 8 10*3/uL (ref 4.0–10.5)

## 2013-11-16 LAB — RENAL FUNCTION PANEL
ALBUMIN: 2.5 g/dL — AB (ref 3.5–5.2)
BUN: 62 mg/dL — ABNORMAL HIGH (ref 6–23)
CHLORIDE: 91 meq/L — AB (ref 96–112)
CO2: 23 mEq/L (ref 19–32)
Calcium: 8.7 mg/dL (ref 8.4–10.5)
Creatinine, Ser: 6.61 mg/dL — ABNORMAL HIGH (ref 0.50–1.35)
GFR calc Af Amer: 10 mL/min — ABNORMAL LOW (ref >90)
GFR, EST NON AFRICAN AMERICAN: 8 mL/min — AB (ref >90)
Glucose, Bld: 154 mg/dL — ABNORMAL HIGH (ref 70–99)
POTASSIUM: 5.5 meq/L — AB (ref 3.7–5.3)
Phosphorus: 5.5 mg/dL — ABNORMAL HIGH (ref 2.3–4.6)
SODIUM: 135 meq/L — AB (ref 137–147)

## 2013-11-16 LAB — BODY FLUID CULTURE: Culture: NO GROWTH

## 2013-11-16 MED ORDER — SODIUM CHLORIDE 0.9 % IV SOLN: 100.0000 mL | INTRAVENOUS | Status: DC | PRN

## 2013-11-16 MED ORDER — HEPARIN SODIUM (PORCINE) 1000 UNIT/ML DIALYSIS: 3000.0000 [IU] | Freq: Once | INTRAMUSCULAR | Status: DC

## 2013-11-16 MED ORDER — NEPRO/CARBSTEADY PO LIQD
237.0000 mL | ORAL | Status: DC | PRN
  Filled 2013-11-16: qty 237

## 2013-11-16 MED ORDER — LIDOCAINE HCL (PF) 1 % IJ SOLN: 5.0000 mL | INTRAMUSCULAR | Status: DC | PRN

## 2013-11-16 MED ORDER — HEPARIN SODIUM (PORCINE) 1000 UNIT/ML DIALYSIS: 1000.0000 [IU] | INTRAMUSCULAR | Status: DC | PRN

## 2013-11-16 MED ORDER — PENTAFLUOROPROP-TETRAFLUOROETH EX AERO: 1.0000 "application " | INHALATION_SPRAY | CUTANEOUS | Status: DC | PRN

## 2013-11-16 MED ORDER — LIDOCAINE-PRILOCAINE 2.5-2.5 % EX CREA
1.0000 "application " | TOPICAL_CREAM | CUTANEOUS | Status: DC | PRN
  Filled 2013-11-16: qty 5

## 2013-11-16 MED ORDER — DOXERCALCIFEROL 4 MCG/2ML IV SOLN
INTRAVENOUS | Status: AC
  Administered 2013-11-16: 2 ug via INTRAVENOUS
  Filled 2013-11-16: qty 2

## 2013-11-16 MED ORDER — ALTEPLASE 2 MG IJ SOLR
2.0000 mg | Freq: Once | INTRAMUSCULAR | Status: DC | PRN
  Filled 2013-11-16: qty 2

## 2013-11-16 NOTE — Progress Notes (Addendum)
[facility] Progress Note   Subjective: For Dialysis today Will be staying at an extended care [address]  Have given that information to transmit to the CLIP office Still no guarantee that we can get this done by tomorrow He dislikes the hospital  Filed Vitals:   11/14/13 2124 11/15/13 2047 11/15/13 2112 11/16/13 0535  BP: 157/80 147/89  138/93  Pulse: 91 84 86 81  Temp: 98.7 F (37.1 C) 97.7 F (36.5 C)  97.8 F (36.6 C)  TempSrc: Oral Oral  Oral  Resp: 20 18  18   Height:      Weight:    59 kg (130 lb 1.1 oz)  SpO2: 98% 100%  100%   Weight trending: 11/16/13 0535 59 kg  11/14/13 1306 58.2 kg   Post HD 11/14/13 0958 61.2 kg   pre HD  Exam:   Alert, no distress other than distressed that we don't have a dialysis spot for him yet Clear bilat, no rales or wheezing  RRR no M or rub  Abd distended w ascites, nontender and soft despite distension 1+ edema LE's Alert, nf, ox3, no asterixis RUA AV graft is patent with + bruit and thrill  Additional objective data  Recent Labs Lab 11/13/13 0524 11/14/13 0510 11/15/13 0435  NA 136* 135* 136*  K 3.8 4.3 4.2  CL 93* 90* 93*  CO2 25 26 25   GLUCOSE 121* 141* 101*  BUN 35* 54* 39*  CREATININE 5.25* 6.53* 5.14*  CALCIUM 7.9* 8.2* 8.2*  PHOS 5.3* 3.9 4.2    Recent Labs Lab 11/11/13 0940 11/13/13 0524 11/14/13 0510 11/15/13 0435  AST 151*  --   --   --   ALT 88*  --   --   --   ALKPHOS 161*  --   --   --   BILITOT 3.7*  --   --   --   PROT 8.5*  --   --   --   ALBUMIN 3.5 2.4* 2.4* 2.3*    Recent Labs Lab 11/11/13 0940 11/12/13 0513 11/13/13 0524  WBC 11.3* 10.9* 9.7  NEUTROABS 7.8*  --   --   HGB 12.7* 10.6* 10.5*  HCT 35.9* 29.3* 29.4*  MCV 74.3* 72.7* 73.3*  PLT 113* 113* 94*   Lab Results  Component Value Date   PTH 618.0* 11/11/2013   CALCIUM 8.2* 11/15/2013   PHOS 4.2 11/15/2013    albuterol, benzonatate, feeding supplement (NEPRO CARB STEADY), ondansetron (ZOFRAN)  IV, ondansetron  Medications: . aspirin EC  81 mg Oral Daily  . calcium acetate  1,334 mg Oral TID WC  . darbepoetin  25 mcg Intravenous Q Mon-HD  . doxercalciferol  2 mcg Intravenous Q M,W,F-HD  . heparin  5,000 Units Subcutaneous Q8H  . metoprolol tartrate  50 mg Oral BID  . prazosin  4 mg Oral QHS  . sodium chloride  3 mL Intravenous Q12H  . traZODone  25 mg Oral QHS     Outpatient Dialysis[place] ([place] [phone])  4h   58.5kg (trying to lower in hosp)    F160    2K/2.5Ca Bath    Heparin 3000  RUA AVGG Hectorol 2ug once per wk -incr'd to TIW in hosp     EPO 8000 1x per wk  Assessment/Plan:  1. Afib with RVR: rate controlled now; on metoprolol 2. Ascites / Hep C: s/p paracentesis (1/3 liters; 1/3) Still with ascites but not tense 3. Volume overload /  LE edema: just below "EDW" Still some edema. Try to lower a bit more today for new EDW at discharge 4. ESRD: HD MWF. Usual HD [place], [place], pt is here without outpatient HD orders or place to dialyze. [facility] at [facility].  Have given that information to transmit to [facility] office - not sure how long this will take to get done 5. 2HPTH:  Continue hectorol but increase to TIW  (PTH 618); phos normal now on binder 6. Anemia of CKD: Hb 10's and stable cont esa with darbe 25/wk 7. COPD: stable 8. Hyperkalemia: resolved 9. Social - CLIP now that we have address. Hope we can get this done by tomorrow.  Transportation will also need to be arranged to whichever [facility] he is CLIPPED to. Dimitris Shanahan B, MD  ADDENDUM: Pt will be going to the [facility] [address] [place]) [id number]  [phone] He will need to go there on Friday to sign paperwork and will then start on a Tues Thurs Sat schedule thereafter - can start on Saturday

## 2013-11-16 NOTE — Procedures (Signed)
I have personally attended this patient's dialysis session.   AVG BFR 400  Goal weight 58 kg Labs pending Has outpt spot on TTS at [facility] (sign papers Friday, start Saturday)   [name]

## 2013-11-16 NOTE — Progress Notes (Signed)
  Date: 11/16/2013  Patient name: JOSIYAH HENAO  Medical record number: [id number]  Date of birth: May 20, 1956   This patient has been seen and the plan of care was discussed with the house staff. Dr. Thornell Sartorius note is pending at the time of this note.  Please see her completed note for details of the days plan.   Mr. Saucer has improved somewhat today, was in HD when I saw him but apparently left session early.  He has discharge planning in place, including an option to stay here in the area (accepted to a local HD center) or to attend a treatment facility in [place].  This should be figured out tomorrow if patient is able to follow either of these plans.  Medical issues will be listed in detail in Dr. Thornell Sartorius note and include ESRD on HD, Afib, Chronic liver cirrhosis, microcytic anemia, HTN, COPD.   Inez Catalina, MD 11/16/2013, 5:48 PM

## 2013-11-16 NOTE — Accreditation Note (Signed)
Hemodialysis- Pt signed off treatment AMA with 50 minutes remaining. Pt states "I feel bad and dont want to hear why it is important to stay for my full treatment." Dr. Eliott Nine notified. Pt transported back to room in stable condition.

## 2013-11-16 NOTE — Progress Notes (Addendum)
Subjective:  Pt seen and examined in AM.  No acute events overnight. Pt reports feeling okay. He denies adominal distension, pain, fever, chills, dyspnea, chest pain, lightheadedness, headache, nausea, vomiting, change in BM or urination. He states that he wants to go to substance abuse program in [place].     Objective: Vital signs in last 24 hours: Filed Vitals:   11/16/13 1245 11/16/13 1300 11/16/13 1330 11/16/13 1400  BP: 153/98 107/77 108/79 145/86  Pulse: 81 80 81 85  Temp:      TempSrc:      Resp:      Height:      Weight:      SpO2:       Weight change: -2.2 kg (-4 lb 13.6 oz)  Intake/Output Summary (Last 24 hours) at 11/16/13 1413 Last data filed at 11/16/13 0000  Gross per 24 hour  Intake    600 ml  Output      0 ml  Net    600 ml   General: Not in acute distress  HEENT:  EOMI   Cardiac: S1/S2, RRR, No murmurs, gallops or rubs  Pulm:  No rales, wheezing, or rubs.  Abd: mildly distended, no abdominal tenderness, or rebound pain. BS present  Ext: +1 leg edema bilaterally  Musculoskeletal: normal ROM  Skin: No rashes.  Neuro: Alert and oriented X3.  Psych: Cooperative   Lab Results: Basic Metabolic Panel:  Recent Labs Lab 11/11/13 0940  11/13/13 0524  11/15/13 0435 11/16/13 1251  NA 137  < > 136*  < > 136* 135*  K 7.3*  < > 3.8  < > 4.2 5.5*  CL 88*  < > 93*  < > 93* 91*  CO2 17*  < > 25  < > 25 23  GLUCOSE 67*  < > 121*  < > 101* 154*  BUN 79*  < > 35*  < > 39* 62*  CREATININE 8.72*  < > 5.25*  < > 5.14* 6.61*  CALCIUM 9.4  < > 7.9*  < > 8.2* 8.7  MG 2.6*  --  2.0  --   --   --   PHOS  --   < > 5.3*  < > 4.2 5.5*  < > = values in this interval not displayed. Liver Function Tests:  Recent Labs Lab 11/11/13 0940  11/15/13 0435 11/16/13 1251  AST 151*  --   --   --   ALT 88*  --   --   --   ALKPHOS 161*  --   --   --   BILITOT 3.7*  --   --   --   PROT 8.5*  --   --   --   ALBUMIN 3.5  < > 2.3* 2.5*  < > = values in this interval not  displayed. No results found for this basename: LIPASE, AMYLASE,  in the last 168 hours No results found for this basename: AMMONIA,  in the last 168 hours CBC:  Recent Labs Lab 11/11/13 0940  11/13/13 0524 11/16/13 1251  WBC 11.3*  < > 9.7 8.0  NEUTROABS 7.8*  --   --   --   HGB 12.7*  < > 10.5* 9.9*  HCT 35.9*  < > 29.4* 28.6*  MCV 74.3*  < > 73.3* 75.9*  PLT 113*  < > 94* 79*  < > = values in this interval not displayed. Cardiac Enzymes:  Recent Labs Lab 11/11/13 2222 11/12/13 0740  TROPONINI <0.30 <0.30   BNP: No results found for this basename: PROBNP,  in the last 168 hours D-Dimer: No results found for this basename: DDIMER,  in the last 168 hours CBG: No results found for this basename: GLUCAP,  in the last 168 hours Hemoglobin A1C:  Recent Labs Lab 11/12/13 0513  HGBA1C 5.3   Fasting Lipid Panel:  Recent Labs Lab 11/12/13 0513  CHOL 92  HDL 13*  LDLCALC 63  TRIG 78  CHOLHDL 7.1   Thyroid Function Tests: No results found for this basename: TSH, T4TOTAL, FREET4, T3FREE, THYROIDAB,  in the last 168 hours Coagulation: No results found for this basename: LABPROT, INR,  in the last 168 hours Anemia Panel: No results found for this basename: VITAMINB12, FOLATE, FERRITIN, TIBC, IRON, RETICCTPCT,  in the last 168 hours Urine Drug Screen: Drugs of Abuse  No results found for this basename: labopia,  cocainscrnur,  labbenz,  amphetmu,  thcu,  labbarb    Alcohol Level: No results found for this basename: ETH,  in the last 168 hours Urinalysis: No results found for this basename: COLORURINE, APPERANCEUR, LABSPEC, PHURINE, GLUCOSEU, HGBUR, BILIRUBINUR, KETONESUR, PROTEINUR, UROBILINOGEN, NITRITE, LEUKOCYTESUR,  in the last 168 hours   Micro Results: Recent Results (from the past 240 hour(s))  BODY FLUID CULTURE     Status: None   Collection Time    11/12/13 10:14 AM      Result Value Range Status   Specimen Description ASCITIC FLUID   Final   Special  Requests NONE   Final   Gram Stain     Final   Value: WBC PRESENT,BOTH PMN AND MONONUCLEAR     NO ORGANISMS SEEN     CYTOSPIN Gram Stain Report Called to,Read Back By and Verified With: Gram Stain Report Called to,Read Back By and Verified With: COLE A  RN 11/12/13 1117 BY JONESJ Performed at [facility]     Performed at [facility]   Culture     Final   Value: NO GROWTH 3 DAYS     Performed at [facility]   Report Status 11/16/2013 FINAL   Final  GRAM STAIN     Status: None   Collection Time    11/12/13 10:14 AM      Result Value Range Status   Specimen Description ASCITIC FLUID   Final   Special Requests NONE   Final   Gram Stain     Final   Value: WBC PRESENT,BOTH PMN AND MONONUCLEAR     NO ORGANISMS SEEN     CYTOSPIN SLIDE     Gram Stain Report Called to,Read Back By and Verified With: COLE A.,RN 11/12/13 1117 BY JONESJ   Report Status 11/12/2013 FINAL   Final   Studies/Results: No results found. Medications: I have reviewed the patient's current medications. Scheduled Meds: . aspirin EC  81 mg Oral Daily  . calcium acetate  1,334 mg Oral TID WC  . darbepoetin  25 mcg Intravenous Q Mon-HD  . doxercalciferol      . doxercalciferol  2 mcg Intravenous Q M,W,F-HD  . [START ON 11/17/2013] heparin  3,000 Units Dialysis Once in dialysis  . heparin  5,000 Units Subcutaneous Q8H  . metoprolol tartrate  50 mg Oral BID  . prazosin  4 mg Oral QHS  . sodium chloride  3 mL Intravenous Q12H  . traZODone  25 mg Oral QHS   Continuous Infusions:  PRN Meds:.sodium chloride, sodium chloride, albuterol, alteplase,  benzonatate, feeding supplement (NEPRO CARB STEADY), heparin, lidocaine (PF), lidocaine-prilocaine, ondansetron (ZOFRAN) IV, ondansetron, pentafluoroprop-tetrafluoroeth Assessment/Plan: Principal Problem:   Hyperkalemia Active Problems:   COPD (chronic obstructive pulmonary disease)   Hep C w/ coma, chronic   ESRD on hemodialysis   Ascites   Substance  abuse   PAF (paroxysmal atrial fibrillation)   HTN (hypertension)   Cirrhosis of liver due to hepatitis C   PTSD (post-traumatic stress disorder)   Depression  Assessment: 58 year old man with ESRD on HD who presented on 1/2 with abdominal distension and pain and found to have hyperkalemia in setting of ESRD.     Homelessness - Pt recently moved from [place] and currently homeless, however per pt he receives money monthly. Has 2 sons in town however has not had contact in past 5 years.   -SW/CM --> Pt to stay at extended stay hotel and HD center at [facility] (to start on Sat) ORr to attend Substance Abuse Center in [place] to be discussed on 11/17/13, pt to complete phone interview to be accepted to program (pt prefers this option) -Obtained medical records from [facility]  End Stage Renal Disease on HD - on MWF schedule, currently accepted to HD outpatient center in [place] -HD today, signed off early due to cramping -Monitor wt (122 to 129) and I & Os (none recorded) -Continue rena-vit -Nephrology following  Hyperkalemia - Potassium level of 5.5 today due to ESRD.  -Continue HD -Continue to monitor BMP  Secondary Hyperparathyroidism - pt with elevated PTH, corrected calcium & phosp wnl  -Continue phoslo,hectorol,  vitamin D 2ug per week  Hypoalbuminemia - Pt with chronic low albumin levels most likely due to chronic cirrhosis.  -Continue renal diet  Atrial Fibrillation  - currently normal sinus rhythm  -Continue metoprolol 50 mg BID -Continue 81 mg aspirin daily  Hypertension - currently normotensive    -Continue metoprolol 50 mg BID  Chronic liver cirrhosis with ascites - Most likely due to chronic Hepatitis C virus. Diagnostic and therapeutic (1.3L) paracentesis performed on 1/3 with no PMN>250 or single organism on culture to suggest SBP. Cytology of ascites fluid form 11/11/13 with reactive appearing mesothelial cells.  -Continue to monitor for SBP  Microcytic anemia  -stable H/H without active bleeding or hemodynamic instability. Most likely due to CKD.   -Continue to monitor for bleeding  -Continue ESA and darbepotein 25 weekly  Thrombocytopenia - stable with no active bleeding or bruising. Pt presented with platelt count of 113K with unknown baseline. Most likely due to chronic liver cirrhosis.  -Continue to monitor for bleeding   COPD - no recent exacerbations requiring steroid use. Pt with reported productive cough for 1 month and mild dyspnea on admission, with no wheezing or chest tightness. Pt on unknown inhalers and chronic heavy smoker with 50 pack history.  -Albuterol nebulizer PRN acute bronchospasm -Ipatropium nebulizer BID  Substance Abuse - Pt with history of cocaine use. Also cigarette smoker (1 pack 50 yrs). -Encourage drug cessation   Insomnia--has had trouble sleeping past two nights  -Ambien 5mg  PRN -Continue home prazosin 4 mg at night for nightmares -Continue home trazodone 25 mg at night  Dispo: Disposition is deferred at this time, awaiting improvement of current medical problems.  Anticipated discharge in approximately 3 day(s).   The patient does not have a current PCP (No primary provider on file.) and does need an [facility] hospital follow-up appointment after discharge.  The patient does have transportation limitations that hinder transportation to clinic appointments.  Marland Kitchen  Services Needed at time of discharge: Y = Yes, Blank = No PT:   OT:   RN:   Equipment:   Other:     LOS: 5 days   Otis Brace, MD 11/16/2013, 2:13 PM

## 2013-11-16 NOTE — Progress Notes (Signed)
Patient has been set up with a long-term hotel call [facility] off of [address].CSW will give patient a cab voucher to get to his bank but patient will need to figure out transportation to and from his dialysis center afterwards.   CSW was also called by patient's social worker from the [facility] [place] Clois Comber- [phone] ext: [phone]) Patient has been set up with a Substance abuse program in [facility] [place] called [facility] [facility]. Patient needs to complete a phone interview with [name] at [facility]. Patient can call [phone] to complete his phone interview. If accepted at the program, [facility] will arrange transportation for the patient down to [place].   CSW will continue to follow.   Sabino Niemann, MSW, [employer] [phone]

## 2013-11-16 NOTE — Progress Notes (Signed)
11/16/2013 2:28 PM Hemodialysis Outpatient Note; this patient has been accepted at the [facility] kidney center on a Tuesday, Thursday and Saturday 2nd shift schedule. If the patient can visit the center on Friday and get his paperwork and consent signed the center is prepared to start treatment on Saturday the January 10th. Thank you. Tilman Neat

## 2013-11-17 LAB — RENAL FUNCTION PANEL
ALBUMIN: 2.5 g/dL — AB (ref 3.5–5.2)
BUN: 38 mg/dL — AB (ref 6–23)
CO2: 28 mEq/L (ref 19–32)
Calcium: 9 mg/dL (ref 8.4–10.5)
Chloride: 92 mEq/L — ABNORMAL LOW (ref 96–112)
Creatinine, Ser: 4.64 mg/dL — ABNORMAL HIGH (ref 0.50–1.35)
GFR calc Af Amer: 15 mL/min — ABNORMAL LOW (ref >90)
GFR calc non Af Amer: 13 mL/min — ABNORMAL LOW (ref >90)
Glucose, Bld: 155 mg/dL — ABNORMAL HIGH (ref 70–99)
PHOSPHORUS: 4.8 mg/dL — AB (ref 2.3–4.6)
POTASSIUM: 4.8 meq/L (ref 3.7–5.3)
Sodium: 136 mEq/L — ABNORMAL LOW (ref 137–147)

## 2013-11-17 MED ORDER — TRAZODONE 25 MG HALF TABLET: 25.0000 mg | ORAL_TABLET | Freq: Every day | ORAL | Status: DC

## 2013-11-17 MED ORDER — NEPHRO-VITE 0.8 MG PO TABS: 1.0000 | ORAL_TABLET | Freq: Every day | ORAL | Status: DC

## 2013-11-17 MED ORDER — ARIPIPRAZOLE 5 MG PO TABS: 2.5000 mg | ORAL_TABLET | Freq: Every day | ORAL | Status: DC

## 2013-11-17 MED ORDER — FOLIC ACID 1 MG PO TABS: 1.0000 mg | ORAL_TABLET | Freq: Every day | ORAL | Status: DC

## 2013-11-17 MED ORDER — METOPROLOL TARTRATE 50 MG PO TABS: 50.0000 mg | ORAL_TABLET | Freq: Two times a day (BID) | ORAL | Status: DC

## 2013-11-17 MED ORDER — BUDESONIDE-FORMOTEROL FUMARATE 80-4.5 MCG/ACT IN AERO: 2.0000 | INHALATION_SPRAY | Freq: Four times a day (QID) | RESPIRATORY_TRACT | Status: DC | PRN

## 2013-11-17 MED ORDER — IPRATROPIUM-ALBUTEROL 18-103 MCG/ACT IN AERO: 1.0000 | INHALATION_SPRAY | Freq: Four times a day (QID) | RESPIRATORY_TRACT | Status: DC | PRN

## 2013-11-17 MED ORDER — ASPIRIN 81 MG PO TBEC: 81.0000 mg | DELAYED_RELEASE_TABLET | Freq: Every day | ORAL | Status: DC

## 2013-11-17 MED ORDER — ALBUTEROL SULFATE HFA 108 (90 BASE) MCG/ACT IN AERS: 1.0000 | INHALATION_SPRAY | Freq: Four times a day (QID) | RESPIRATORY_TRACT | Status: DC | PRN

## 2013-11-17 MED ORDER — AMLODIPINE BESYLATE 10 MG PO TABS: 10.0000 mg | ORAL_TABLET | Freq: Every day | ORAL | Status: DC

## 2013-11-17 MED ORDER — CARVEDILOL 25 MG PO TABS: 25.0000 mg | ORAL_TABLET | Freq: Two times a day (BID) | ORAL | Status: DC

## 2013-11-17 MED ORDER — LATANOPROST 0.005 % OP SOLN: 1.0000 [drp] | Freq: Every day | OPHTHALMIC | Status: DC

## 2013-11-17 MED ORDER — HYDROXYZINE HCL 50 MG PO TABS: 50.0000 mg | ORAL_TABLET | Freq: Four times a day (QID) | ORAL | Status: DC | PRN

## 2013-11-17 MED ORDER — PRAZOSIN HCL 2 MG PO CAPS: 4.0000 mg | ORAL_CAPSULE | Freq: Every day | ORAL | Status: DC

## 2013-11-17 MED ORDER — CALCIUM ACETATE 667 MG PO CAPS: 1334.0000 mg | ORAL_CAPSULE | Freq: Three times a day (TID) | ORAL | Status: DC

## 2013-11-17 MED ORDER — DOCUSATE SODIUM 100 MG PO CAPS: 100.0000 mg | ORAL_CAPSULE | Freq: Every day | ORAL | Status: DC

## 2013-11-17 MED ORDER — DILTIAZEM HCL ER 120 MG PO CP24: 120.0000 mg | ORAL_CAPSULE | Freq: Every day | ORAL | Status: DC

## 2013-11-17 NOTE — Discharge Instructions (Signed)
-  Go To [facility] tomm between 8-12PM to sign papers, you will start dialysis on Saturday Address: [address] [place], [place] [address] Phone: [phone] -You will need to establish care with the [facility]

## 2013-11-17 NOTE — Progress Notes (Signed)
Pt provided with dc instructions and education. Pt verbalized understanding. No questions at this time. Cab called and voucher given to patient. Levonne Spiller, RN

## 2013-11-17 NOTE — Progress Notes (Signed)
Subjective:  Pt seen and examined in AM.  No acute events overnight. Pt reports feeling okay. He denies adominal distension, pain, fever, chills, dyspnea, chest pain, lightheadedness, headache, nausea, vomiting, change in BM or urination. He states that he wants to go to the hotel in [place].     Objective: Vital signs in last 24 hours: Filed Vitals:   11/16/13 1530 11/16/13 1554 11/16/13 2158 11/17/13 0535  BP: 150/99 132/98 131/85 142/67  Pulse: 85 84 80 78  Temp:  97.5 F (36.4 C) 98.4 F (36.9 C) 98.6 F (37 C)  TempSrc:  Oral Oral Oral  Resp:  17 18 18   Height:      Weight:  58.7 kg (129 lb 6.6 oz)  59.2 kg (130 lb 8.2 oz)  SpO2:  99% 100% 100%   Weight change: 3.1 kg (6 lb 13.4 oz)  Intake/Output Summary (Last 24 hours) at 11/17/13 1357 Last data filed at 11/17/13 0900  Gross per 24 hour  Intake    720 ml  Output   2763 ml  Net  -2043 ml   General: Not in acute distress  HEENT:  EOMI   Cardiac: S1/S2, RRR, No murmurs, gallops or rubs  Pulm:  No rales, wheezing, or rubs.  Abd: mildly distended, no abdominal tenderness, or rebound pain. BS present  Ext: +1 leg edema bilaterally  Musculoskeletal: normal ROM  Skin: No rashes.  Neuro: Alert and oriented X3.  Psych: Cooperative   Lab Results: Basic Metabolic Panel:  Recent Labs Lab 11/11/13 0940  11/13/13 0524  11/16/13 1251 11/17/13 0659  NA 137  < > 136*  < > 135* 136*  K 7.3*  < > 3.8  < > 5.5* 4.8  CL 88*  < > 93*  < > 91* 92*  CO2 17*  < > 25  < > 23 28  GLUCOSE 67*  < > 121*  < > 154* 155*  BUN 79*  < > 35*  < > 62* 38*  CREATININE 8.72*  < > 5.25*  < > 6.61* 4.64*  CALCIUM 9.4  < > 7.9*  < > 8.7 9.0  MG 2.6*  --  2.0  --   --   --   PHOS  --   < > 5.3*  < > 5.5* 4.8*  < > = values in this interval not displayed. Liver Function Tests:  Recent Labs Lab 11/11/13 0940  11/16/13 1251 11/17/13 0659  AST 151*  --   --   --   ALT 88*  --   --   --   ALKPHOS 161*  --   --   --   BILITOT  3.7*  --   --   --   PROT 8.5*  --   --   --   ALBUMIN 3.5  < > 2.5* 2.5*  < > = values in this interval not displayed. No results found for this basename: LIPASE, AMYLASE,  in the last 168 hours No results found for this basename: AMMONIA,  in the last 168 hours CBC:  Recent Labs Lab 11/11/13 0940  11/13/13 0524 11/16/13 1251  WBC 11.3*  < > 9.7 8.0  NEUTROABS 7.8*  --   --   --   HGB 12.7*  < > 10.5* 9.9*  HCT 35.9*  < > 29.4* 28.6*  MCV 74.3*  < > 73.3* 75.9*  PLT 113*  < > 94* 79*  < > = values in this  interval not displayed. Cardiac Enzymes:  Recent Labs Lab 11/11/13 2222 11/12/13 0740  TROPONINI <0.30 <0.30   BNP: No results found for this basename: PROBNP,  in the last 168 hours D-Dimer: No results found for this basename: DDIMER,  in the last 168 hours CBG: No results found for this basename: GLUCAP,  in the last 168 hours Hemoglobin A1C:  Recent Labs Lab 11/12/13 0513  HGBA1C 5.3   Fasting Lipid Panel:  Recent Labs Lab 11/12/13 0513  CHOL 92  HDL 13*  LDLCALC 63  TRIG 78  CHOLHDL 7.1   Thyroid Function Tests: No results found for this basename: TSH, T4TOTAL, FREET4, T3FREE, THYROIDAB,  in the last 168 hours Coagulation: No results found for this basename: LABPROT, INR,  in the last 168 hours Anemia Panel: No results found for this basename: VITAMINB12, FOLATE, FERRITIN, TIBC, IRON, RETICCTPCT,  in the last 168 hours Urine Drug Screen: Drugs of Abuse  No results found for this basename: labopia,  cocainscrnur,  labbenz,  amphetmu,  thcu,  labbarb    Alcohol Level: No results found for this basename: ETH,  in the last 168 hours Urinalysis: No results found for this basename: COLORURINE, APPERANCEUR, LABSPEC, PHURINE, GLUCOSEU, HGBUR, BILIRUBINUR, KETONESUR, PROTEINUR, UROBILINOGEN, NITRITE, LEUKOCYTESUR,  in the last 168 hours   Micro Results: Recent Results (from the past 240 hour(s))  BODY FLUID CULTURE     Status: None   Collection Time     11/12/13 10:14 AM      Result Value Range Status   Specimen Description ASCITIC FLUID   Final   Special Requests NONE   Final   Gram Stain     Final   Value: WBC PRESENT,BOTH PMN AND MONONUCLEAR     NO ORGANISMS SEEN     CYTOSPIN Gram Stain Report Called to,Read Back By and Verified With: Gram Stain Report Called to,Read Back By and Verified With: COLE A  RN 11/12/13 1117 BY JONESJ Performed at [facility]     Performed at [facility]   Culture     Final   Value: NO GROWTH 3 DAYS     Performed at [facility]   Report Status 11/16/2013 FINAL   Final  GRAM STAIN     Status: None   Collection Time    11/12/13 10:14 AM      Result Value Range Status   Specimen Description ASCITIC FLUID   Final   Special Requests NONE   Final   Gram Stain     Final   Value: WBC PRESENT,BOTH PMN AND MONONUCLEAR     NO ORGANISMS SEEN     CYTOSPIN SLIDE     Gram Stain Report Called to,Read Back By and Verified With: COLE A.,RN 11/12/13 1117 BY JONESJ   Report Status 11/12/2013 FINAL   Final   Studies/Results: No results found. Medications: I have reviewed the patient's current medications. Scheduled Meds: . aspirin EC  81 mg Oral Daily  . calcium acetate  1,334 mg Oral TID WC  . darbepoetin  25 mcg Intravenous Q Mon-HD  . doxercalciferol  2 mcg Intravenous Q M,W,F-HD  . heparin  5,000 Units Subcutaneous Q8H  . metoprolol tartrate  50 mg Oral BID  . prazosin  4 mg Oral QHS  . sodium chloride  3 mL Intravenous Q12H  . traZODone  25 mg Oral QHS   Continuous Infusions:  PRN Meds:.albuterol, benzonatate, ondansetron (ZOFRAN) IV, ondansetron Assessment/Plan: Principal Problem:   Hyperkalemia  Active Problems:   COPD (chronic obstructive pulmonary disease)   Hep C w/ coma, chronic   ESRD on hemodialysis   Ascites   Substance abuse   PAF (paroxysmal atrial fibrillation)   HTN (hypertension)   Cirrhosis of liver due to hepatitis C   PTSD (post-traumatic stress  disorder)   Depression  Assessment: 58 year old man with ESRD on HD who presented on 1/2 with abdominal distension and pain and found to have hyperkalemia in setting of ESRD.     Homelessness - Pt recently moved from [place] and currently homeless, however per pt he receives money monthly. Has 2 sons in town however has not had contact in past 5 years.   -SW/CM --> Pt to stay at extended stay hotel and HD center at [facility] (to start on Sat on TTS) -Discharge today   End Stage Renal Disease on HD - on MWF schedule, [place] -Monitor wt and I & Os  -Continue rena-vit -Nephrology following  Secondary Hyperparathyroidism - pt with elevated PTH, corrected calcium & phosp wnl  -Continue phoslo,hectorol,  vitamin D 2ug per week  Hypoalbuminemia - Pt with chronic low albumin levels most likely due to chronic cirrhosis.  -Continue renal diet  Atrial Fibrillation  - currently normal sinus rhythm  -Continue metoprolol 50 mg BID -Continue 81 mg aspirin daily  Hypertension - currently hypertensive    -Continue metoprolol 50 mg BID  Chronic liver cirrhosis with ascites - Most likely due to chronic Hepatitis C virus. Diagnostic and therapeutic (1.3L) paracentesis performed on 1/3 with no PMN>250 or single organism on culture to suggest SBP. Cytology of ascites fluid form 11/11/13 with reactive appearing mesothelial cells.  -Continue to monitor for SBP  Microcytic anemia -stable H/H without active bleeding or hemodynamic instability. Most likely due to CKD.   -Continue to monitor for bleeding  -Continue ESA and darbepotein 25 weekly  Thrombocytopenia - stable with no active bleeding or bruising. Pt presented with platelt count of 113K with unknown baseline. Most likely due to chronic liver cirrhosis.  -Continue to monitor for bleeding   COPD - no recent exacerbations requiring steroid use. Pt with reported productive cough for 1 month and mild  dyspnea on admission, with no wheezing or chest tightness. Pt on unknown inhalers and chronic heavy smoker with 50 pack history.  -Albuterol nebulizer PRN acute bronchospasm -Ipatropium nebulizer BID  Substance Abuse - Pt with history of cocaine use. Also cigarette smoker (1 pack 50 yrs). -Encourage drug cessation   Insomnia--has had trouble sleeping past two nights  -Ambien 5mg  PRN -Continue home prazosin 4 mg at night for nightmares -Continue home trazodone 25 mg at night  Dispo: Disposition is deferred at this time, awaiting improvement of current medical problems.  Anticipated discharge in approximately 3 day(s).   The patient does not have a current PCP (No primary provider on file.) and does need an [facility] follow-up appointment after discharge.  The patient does have transportation limitations that hinder transportation to clinic appointments.  .Services Needed at time of discharge: Y = Yes, Blank = No PT:   OT:   RN:   Equipment:   Other:     LOS: 6 days   Otis BraceMarjan Kendre Jacinto, MD 11/17/2013, 1:57 PM

## 2013-11-20 ENCOUNTER — Encounter (HOSPITAL_COMMUNITY): Payer: Self-pay | Admitting: Emergency Medicine

## 2013-11-20 ENCOUNTER — Emergency Department (HOSPITAL_COMMUNITY): Payer: Medicare Other

## 2013-11-20 ENCOUNTER — Inpatient Hospital Stay (HOSPITAL_COMMUNITY)
Admission: EM | Admit: 2013-11-20 | Discharge: 2013-11-21 | DRG: 640 | Discharge status: Against Medical Advice | Payer: Medicare Other | Attending: Internal Medicine | Admitting: Internal Medicine

## 2013-11-20 DIAGNOSIS — N2581 Secondary hyperparathyroidism of renal origin: Secondary | ICD-10-CM | POA: Diagnosis present

## 2013-11-20 DIAGNOSIS — F4310 Post-traumatic stress disorder, unspecified: Secondary | ICD-10-CM | POA: Diagnosis present

## 2013-11-20 DIAGNOSIS — D631 Anemia in chronic kidney disease: Secondary | ICD-10-CM | POA: Diagnosis present

## 2013-11-20 DIAGNOSIS — I10 Essential (primary) hypertension: Secondary | ICD-10-CM | POA: Diagnosis present

## 2013-11-20 DIAGNOSIS — F1410 Cocaine abuse, uncomplicated: Secondary | ICD-10-CM | POA: Diagnosis present

## 2013-11-20 DIAGNOSIS — Z992 Dependence on renal dialysis: Secondary | ICD-10-CM

## 2013-11-20 DIAGNOSIS — F1910 Other psychoactive substance abuse, uncomplicated: Secondary | ICD-10-CM | POA: Diagnosis present

## 2013-11-20 DIAGNOSIS — N186 End stage renal disease: Secondary | ICD-10-CM

## 2013-11-20 DIAGNOSIS — K7460 Unspecified cirrhosis of liver: Secondary | ICD-10-CM | POA: Diagnosis present

## 2013-11-20 DIAGNOSIS — E872 Acidosis, unspecified: Secondary | ICD-10-CM | POA: Diagnosis present

## 2013-11-20 DIAGNOSIS — I480 Paroxysmal atrial fibrillation: Secondary | ICD-10-CM | POA: Diagnosis present

## 2013-11-20 DIAGNOSIS — N039 Chronic nephritic syndrome with unspecified morphologic changes: Secondary | ICD-10-CM

## 2013-11-20 DIAGNOSIS — F1210 Cannabis abuse, uncomplicated: Secondary | ICD-10-CM | POA: Diagnosis present

## 2013-11-20 DIAGNOSIS — B182 Chronic viral hepatitis C: Secondary | ICD-10-CM | POA: Diagnosis present

## 2013-11-20 DIAGNOSIS — I1311 Hypertensive heart and chronic kidney disease without heart failure, with stage 5 chronic kidney disease, or end stage renal disease: Secondary | ICD-10-CM | POA: Diagnosis present

## 2013-11-20 DIAGNOSIS — J4489 Other specified chronic obstructive pulmonary disease: Secondary | ICD-10-CM | POA: Diagnosis present

## 2013-11-20 DIAGNOSIS — F17200 Nicotine dependence, unspecified, uncomplicated: Secondary | ICD-10-CM | POA: Diagnosis present

## 2013-11-20 DIAGNOSIS — Z91158 Patient's noncompliance with renal dialysis for other reason: Secondary | ICD-10-CM

## 2013-11-20 DIAGNOSIS — J449 Chronic obstructive pulmonary disease, unspecified: Secondary | ICD-10-CM | POA: Diagnosis present

## 2013-11-20 DIAGNOSIS — E875 Hyperkalemia: Principal | ICD-10-CM | POA: Diagnosis present

## 2013-11-20 DIAGNOSIS — I4891 Unspecified atrial fibrillation: Secondary | ICD-10-CM | POA: Diagnosis present

## 2013-11-20 DIAGNOSIS — R188 Other ascites: Secondary | ICD-10-CM | POA: Diagnosis present

## 2013-11-20 DIAGNOSIS — Z9115 Patient's noncompliance with renal dialysis: Secondary | ICD-10-CM

## 2013-11-20 DIAGNOSIS — N19 Unspecified kidney failure: Secondary | ICD-10-CM

## 2013-11-20 HISTORY — DX: Post-traumatic stress disorder, unspecified: F43.10

## 2013-11-20 LAB — CBC WITH DIFFERENTIAL/PLATELET
BASOS PCT: 0 % (ref 0–1)
Basophils Absolute: 0 10*3/uL (ref 0.0–0.1)
EOS ABS: 0 10*3/uL (ref 0.0–0.7)
EOS PCT: 0 % (ref 0–5)
HCT: 40.4 % (ref 39.0–52.0)
HEMOGLOBIN: 14.1 g/dL (ref 13.0–17.0)
LYMPHS ABS: 1.7 10*3/uL (ref 0.7–4.0)
Lymphocytes Relative: 20 % (ref 12–46)
MCH: 27.1 pg (ref 26.0–34.0)
MCHC: 34.9 g/dL (ref 30.0–36.0)
MCV: 77.5 fL — AB (ref 78.0–100.0)
MONO ABS: 1.2 10*3/uL — AB (ref 0.1–1.0)
Monocytes Relative: 14 % — ABNORMAL HIGH (ref 3–12)
Neutro Abs: 5.5 10*3/uL (ref 1.7–7.7)
Neutrophils Relative %: 66 % (ref 43–77)
PLATELETS: 108 10*3/uL — AB (ref 150–400)
RBC: 5.21 MIL/uL (ref 4.22–5.81)
RDW: 22.2 % — ABNORMAL HIGH (ref 11.5–15.5)
WBC: 8.4 10*3/uL (ref 4.0–10.5)

## 2013-11-20 LAB — TROPONIN I

## 2013-11-20 LAB — GLUCOSE, CAPILLARY: Glucose-Capillary: 48 mg/dL — ABNORMAL LOW (ref 70–99)

## 2013-11-20 LAB — PROTIME-INR
INR: 1.58 — ABNORMAL HIGH (ref 0.00–1.49)
Prothrombin Time: 18.4 seconds — ABNORMAL HIGH (ref 11.6–15.2)

## 2013-11-20 LAB — APTT: aPTT: 27 seconds (ref 24–37)

## 2013-11-20 LAB — LIPASE, BLOOD: Lipase: 101 U/L — ABNORMAL HIGH (ref 11–59)

## 2013-11-20 MED ORDER — DEXTROSE 50 % IV SOLN
1.0000 | Freq: Once | INTRAVENOUS | Status: AC
  Administered 2013-11-21: 50 mL via INTRAVENOUS
  Filled 2013-11-20: qty 50

## 2013-11-20 MED ORDER — ONDANSETRON HCL 4 MG/2ML IJ SOLN
4.0000 mg | Freq: Once | INTRAMUSCULAR | Status: AC
  Administered 2013-11-20: 4 mg via INTRAVENOUS
  Filled 2013-11-20: qty 2

## 2013-11-20 MED ORDER — IPRATROPIUM BROMIDE 0.02 % IN SOLN
0.5000 mg | Freq: Once | RESPIRATORY_TRACT | Status: AC
  Administered 2013-11-20: 0.5 mg via RESPIRATORY_TRACT
  Filled 2013-11-20: qty 2.5

## 2013-11-20 MED ORDER — SODIUM CHLORIDE 0.9 % IV SOLN
1.0000 g | Freq: Once | INTRAVENOUS | Status: AC
  Administered 2013-11-21: 1 g via INTRAVENOUS
  Filled 2013-11-20: qty 10

## 2013-11-20 MED ORDER — INSULIN ASPART 100 UNIT/ML ~~LOC~~ SOLN
10.0000 [IU] | Freq: Once | SUBCUTANEOUS | Status: AC
  Administered 2013-11-21: 10 [IU] via INTRAVENOUS
  Filled 2013-11-20: qty 1

## 2013-11-20 MED ORDER — ONDANSETRON 4 MG PO TBDP
8.0000 mg | ORAL_TABLET | Freq: Once | ORAL | Status: AC
  Administered 2013-11-20: 8 mg via ORAL
  Filled 2013-11-20: qty 2

## 2013-11-20 MED ORDER — ALBUTEROL SULFATE (2.5 MG/3ML) 0.083% IN NEBU
5.0000 mg | INHALATION_SOLUTION | Freq: Once | RESPIRATORY_TRACT | Status: AC
  Administered 2013-11-20: 5 mg via RESPIRATORY_TRACT
  Filled 2013-11-20: qty 6

## 2013-11-20 MED ORDER — DEXTROSE 50 % IV SOLN
1.0000 | Freq: Once | INTRAVENOUS | Status: AC
Start: 2013-11-20 — End: 2013-11-20
  Administered 2013-11-20: 50 mL via INTRAVENOUS
  Filled 2013-11-20: qty 50

## 2013-11-20 MED ORDER — MORPHINE SULFATE 4 MG/ML IJ SOLN
6.0000 mg | Freq: Once | INTRAMUSCULAR | Status: AC
  Administered 2013-11-20: 6 mg via INTRAVENOUS
  Filled 2013-11-20: qty 2

## 2013-11-20 MED ORDER — SODIUM BICARBONATE 8.4 % IV SOLN
50.0000 meq | Freq: Once | INTRAVENOUS | Status: AC
  Administered 2013-11-21: 50 meq via INTRAVENOUS
  Filled 2013-11-20: qty 50

## 2013-11-20 NOTE — ED Provider Notes (Addendum)
CSN: [id number]     Arrival date & time 11/20/13  1440 History   First MD Initiated Contact with Patient 11/20/13 1857     Chief Complaint  Patient presents with  . Abdominal Pain  . Emesis   (Consider location/radiation/quality/duration/timing/severity/associated sxs/prior Treatment) Patient is a 58 y.o. male presenting with abdominal pain and vomiting. The history is provided by the patient.  Abdominal Pain Associated symptoms: vomiting   Emesis Associated symptoms: abdominal pain    patient here complaining of abdominal pain with vomiting and shortness of breath x24 hours. Patient is a dialysis patient and only completed 2 hours of dialysis yesterday. Has had a nonproductive cough without anginal type chest pain. He has had mid sharp abdominal pain with associated nonbilious vomiting and constipation. Denies any diarrhea. Has a history of hepatitis C and has only recently established health care here. He has had a paracentesis recently as well 2. Denies any alcohol use. patient's symptoms are persistent and nothing makes them better or worse. No treatment used prior to arrival.  Past Medical History  Diagnosis Date  . COPD (chronic obstructive pulmonary disease)   . Hypertension   . Hep C w/ coma, chronic   . Irregular heartbeat   . ESRD (end stage renal disease) on dialysis     /notes 11/11/2013  . Smoker unmotivated to quit   . Active smoker    Past Surgical History  Procedure Laterality Date  . Paracentesis  ~ 10/2013    [name] 11/11/2013   No family history on file. History  Substance Use Topics  . Smoking status: Current Every Day Smoker -- 1.50 packs/day for 15 years    Types: Cigarettes  . Smokeless tobacco: Not on file  . Alcohol Use: No    Review of Systems  Gastrointestinal: Positive for vomiting and abdominal pain.  All other systems reviewed and are negative.    Allergies  Review of patient's allergies indicates no known allergies.  Home Medications    Current Outpatient Rx  Name  Route  Sig  Dispense  Refill  . albuterol (PROVENTIL HFA;VENTOLIN HFA) 108 (90 BASE) MCG/ACT inhaler   Inhalation   Inhale 1-2 puffs into the lungs every 6 (six) hours as needed for wheezing or shortness of breath.   1 Inhaler   0   . aspirin 81 MG EC tablet   Oral   Take 1 tablet (81 mg total) by mouth daily.   30 tablet   3   . b complex-vitamin c-folic acid (NEPHRO-VITE) 0.8 MG TABS tablet   Oral   Take 1 tablet by mouth at bedtime.   30 tablet   3   . calcium acetate (PHOSLO) 667 MG capsule   Oral   Take 2 capsules (1,334 mg total) by mouth 3 (three) times daily with meals.   90 capsule   3   . docusate sodium (COLACE) 100 MG capsule   Oral   Take 1 capsule (100 mg total) by mouth daily.   30 capsule   3   . folic acid (FOLVITE) 1 MG tablet   Oral   Take 1 tablet (1 mg total) by mouth daily.   30 tablet   3   . latanoprost (XALATAN) 0.005 % ophthalmic solution   Both Eyes   Place 1 drop into both eyes at bedtime. Affected eye   2.5 mL   12   . metoprolol (LOPRESSOR) 50 MG tablet   Oral   Take 1 tablet (50  mg total) by mouth 2 (two) times daily.   60 tablet   1   . prazosin (MINIPRESS) 2 MG capsule   Oral   Take 2 capsules (4 mg total) by mouth at bedtime.   30 capsule   3   . traZODone (DESYREL) 25 mg TABS tablet   Oral   Take 0.5 tablets (25 mg total) by mouth at bedtime.   30 tablet   3     Take 25 mg at night    BP 180/120  Pulse 94  Resp 18  SpO2 100% Physical Exam  Nursing note and vitals reviewed. Constitutional: He is oriented to person, place, and time. He appears well-developed and well-nourished.  Non-toxic appearance. No distress.  HENT:  Head: Normocephalic and atraumatic.  Eyes: Conjunctivae, EOM and lids are normal. Pupils are equal, round, and reactive to light.  Neck: Normal range of motion. Neck supple. No tracheal deviation present. No mass present.  Cardiovascular: Normal rate, regular  rhythm and normal heart sounds.  Exam reveals no gallop.   No murmur heard. Pulmonary/Chest: Effort normal. No stridor. No respiratory distress. He has decreased breath sounds. He has no wheezes. He has no rhonchi. He has no rales.  Abdominal: Soft. Normal appearance and bowel sounds are normal. He exhibits distension, fluid wave and ascites. There is generalized tenderness. There is no rebound and no CVA tenderness.  Musculoskeletal: Normal range of motion. He exhibits no edema and no tenderness.  Neurological: He is alert and oriented to person, place, and time. He has normal strength. No cranial nerve deficit or sensory deficit. GCS eye subscore is 4. GCS verbal subscore is 5. GCS motor subscore is 6.  Skin: Skin is warm and dry. No abrasion and no rash noted.  Psychiatric: He has a normal mood and affect. His speech is normal and behavior is normal.    ED Course  Procedures (including critical care time) Labs Review Labs Reviewed  CBC WITH DIFFERENTIAL  COMPREHENSIVE METABOLIC PANEL  URINALYSIS, ROUTINE W REFLEX MICROSCOPIC  LIPASE, BLOOD  PROTIME-INR  APTT   Imaging Review No results found.  EKG Interpretation    Date/Time:  Sunday November 20 2013 14:48:46 EST Ventricular Rate:  93 PR Interval:  140 QRS Duration: 102 QT Interval:  398 QTC Calculation: 494 R Axis:   -72 Text Interpretation:  Normal sinus rhythm Biatrial enlargement Pulmonary disease pattern Left anterior fascicular block Left ventricular hypertrophy ST \\T \ Marked T wave abnormality, consider lateral ischemia Prolonged QT Abnormal ECG improved from prior Confirmed by Tylar Amborn  MD, Eldrige Pitkin ([id number]) on 11/20/2013 7:13:26 PM            MDM  No diagnosis found.  Patient given pain meds here for his abdominal pain. He does have mild pancreatitis noted. His EKG does not show hyperkalemia however his electrolytes are significant for severe hyperkalemia. Patient was given calcium gluconate, insulin, glucose.  Consult to nephrology was made for emergent dialysis. Patient blood pressure reassessed and remains mildly hypertensive. Patient's blood sugar noted and he was given  2 amps of D50 prior to insulin. Abdominal exam remains nonsurgical. Old chart was consult as well 2. Spoke with medicine teaching service and he'll be admitted   CRITICAL CARE Performed by: Toy Baker Total critical care time: 27 Critical care time was exclusive of separately billable procedures and treating other patients. Critical care was necessary to treat or prevent imminent or life-threatening deterioration. Critical care was time spent personally by me on the following  activities: development of treatment plan with patient and/or surrogate as well as nursing, discussions with consultants, evaluation of patient's response to treatment, examination of patient, obtaining history from patient or surrogate, ordering and performing treatments and interventions, ordering and review of laboratory studies, ordering and review of radiographic studies, pulse oximetry and re-evaluation of patient's condition.    Toy BakerAnthony T Tryone Kille, MD 11/20/13 2322  Toy BakerAnthony T Mylinh Cragg, MD 11/20/13 [phone]

## 2013-11-20 NOTE — ED Notes (Signed)
Blood was drawn by IV team, labeled at bedside by [place], EMT and sent to lab.  Dr. Freida Busman notified of pt's request for pain medication and nausea medication.

## 2013-11-20 NOTE — ED Notes (Signed)
IV team at bedside 

## 2013-11-20 NOTE — ED Notes (Signed)
Pt is here with abdominal pain to mid abdomen, vomiting, and constipation.  Pt is actively vomiting.  Pt reports Last HD yesterday and they stopped early related to patient being sick

## 2013-11-20 NOTE — ED Notes (Addendum)
Pt given 2 more warm blankets per request.  Pt states he does not urinate.  Unable to give urine sample.  Attempted IV access x 2 without success.  Pt states triage attempted to obtain labs x 4 without success.  IV team paged.  Pt to X-ray at this time.  Neb has been completed by RT.

## 2013-11-20 NOTE — ED Notes (Addendum)
Pt states he just moved here from [place] and he is trying to get set up with [facility] hospital.  Pt has not had medications in 4 days since discharged from hospital.  Pt states he did not go to dialysis on Thursday and that he has just been set up to go to [facility].  Dialysis normally last 4 hours but he was only able to complete 2 hours yesterday because he felt sick.

## 2013-11-21 ENCOUNTER — Encounter (HOSPITAL_COMMUNITY): Payer: Self-pay | Admitting: Internal Medicine

## 2013-11-21 DIAGNOSIS — I4891 Unspecified atrial fibrillation: Secondary | ICD-10-CM

## 2013-11-21 DIAGNOSIS — E8809 Other disorders of plasma-protein metabolism, not elsewhere classified: Secondary | ICD-10-CM

## 2013-11-21 DIAGNOSIS — E875 Hyperkalemia: Principal | ICD-10-CM

## 2013-11-21 DIAGNOSIS — N2581 Secondary hyperparathyroidism of renal origin: Secondary | ICD-10-CM

## 2013-11-21 DIAGNOSIS — N186 End stage renal disease: Secondary | ICD-10-CM

## 2013-11-21 DIAGNOSIS — Z992 Dependence on renal dialysis: Secondary | ICD-10-CM

## 2013-11-21 DIAGNOSIS — E872 Acidosis, unspecified: Secondary | ICD-10-CM

## 2013-11-21 DIAGNOSIS — G4700 Insomnia, unspecified: Secondary | ICD-10-CM

## 2013-11-21 DIAGNOSIS — R112 Nausea with vomiting, unspecified: Secondary | ICD-10-CM

## 2013-11-21 DIAGNOSIS — I120 Hypertensive chronic kidney disease with stage 5 chronic kidney disease or end stage renal disease: Secondary | ICD-10-CM

## 2013-11-21 DIAGNOSIS — R197 Diarrhea, unspecified: Secondary | ICD-10-CM

## 2013-11-21 DIAGNOSIS — D696 Thrombocytopenia, unspecified: Secondary | ICD-10-CM

## 2013-11-21 DIAGNOSIS — D509 Iron deficiency anemia, unspecified: Secondary | ICD-10-CM

## 2013-11-21 DIAGNOSIS — R188 Other ascites: Secondary | ICD-10-CM

## 2013-11-21 DIAGNOSIS — J449 Chronic obstructive pulmonary disease, unspecified: Secondary | ICD-10-CM

## 2013-11-21 DIAGNOSIS — F1910 Other psychoactive substance abuse, uncomplicated: Secondary | ICD-10-CM

## 2013-11-21 DIAGNOSIS — K7460 Unspecified cirrhosis of liver: Secondary | ICD-10-CM

## 2013-11-21 LAB — RENAL FUNCTION PANEL
Albumin: 2.8 g/dL — ABNORMAL LOW (ref 3.5–5.2)
BUN: 37 mg/dL — ABNORMAL HIGH (ref 6–23)
CO2: 24 meq/L (ref 19–32)
CREATININE: 4.2 mg/dL — AB (ref 0.50–1.35)
Calcium: 9.2 mg/dL (ref 8.4–10.5)
Chloride: 91 mEq/L — ABNORMAL LOW (ref 96–112)
GFR, EST AFRICAN AMERICAN: 16 mL/min — AB (ref >90)
GFR, EST NON AFRICAN AMERICAN: 14 mL/min — AB (ref >90)
Glucose, Bld: 118 mg/dL — ABNORMAL HIGH (ref 70–99)
Phosphorus: 7.6 mg/dL — ABNORMAL HIGH (ref 2.3–4.6)
Potassium: 5.1 mEq/L (ref 3.7–5.3)
Sodium: 138 mEq/L (ref 137–147)

## 2013-11-21 LAB — COMPREHENSIVE METABOLIC PANEL
ALK PHOS: 163 U/L — AB (ref 39–117)
ALT: 74 U/L — ABNORMAL HIGH (ref 0–53)
AST: 119 U/L — ABNORMAL HIGH (ref 0–37)
Albumin: 3.2 g/dL — ABNORMAL LOW (ref 3.5–5.2)
BUN: 68 mg/dL — AB (ref 6–23)
CO2: 16 mEq/L — ABNORMAL LOW (ref 19–32)
CREATININE: 6.78 mg/dL — AB (ref 0.50–1.35)
Calcium: 9.7 mg/dL (ref 8.4–10.5)
Chloride: 82 mEq/L — ABNORMAL LOW (ref 96–112)
GFR calc non Af Amer: 8 mL/min — ABNORMAL LOW (ref >90)
GFR, EST AFRICAN AMERICAN: 9 mL/min — AB (ref >90)
GLUCOSE: 57 mg/dL — AB (ref 70–99)
Sodium: 131 mEq/L — ABNORMAL LOW (ref 137–147)
TOTAL PROTEIN: 8.5 g/dL — AB (ref 6.0–8.3)
Total Bilirubin: 5.6 mg/dL — ABNORMAL HIGH (ref 0.3–1.2)

## 2013-11-21 LAB — HEPATITIS B SURFACE ANTIGEN: Hepatitis B Surface Ag: NEGATIVE

## 2013-11-21 LAB — POTASSIUM: POTASSIUM: 4.8 meq/L (ref 3.7–5.3)

## 2013-11-21 LAB — MRSA PCR SCREENING: MRSA by PCR: NEGATIVE

## 2013-11-21 LAB — GLUCOSE, CAPILLARY
GLUCOSE-CAPILLARY: 115 mg/dL — AB (ref 70–99)
GLUCOSE-CAPILLARY: 187 mg/dL — AB (ref 70–99)
Glucose-Capillary: 108 mg/dL — ABNORMAL HIGH (ref 70–99)

## 2013-11-21 LAB — MAGNESIUM: MAGNESIUM: 2.3 mg/dL (ref 1.5–2.5)

## 2013-11-21 MED ORDER — NEPRO/CARBSTEADY PO LIQD: 237.0000 mL | ORAL | Status: DC | PRN

## 2013-11-21 MED ORDER — HEPARIN SODIUM (PORCINE) 1000 UNIT/ML DIALYSIS: 1000.0000 [IU] | INTRAMUSCULAR | Status: DC | PRN

## 2013-11-21 MED ORDER — CALCIUM ACETATE 667 MG PO CAPS
1334.0000 mg | ORAL_CAPSULE | Freq: Three times a day (TID) | ORAL | Status: DC
  Administered 2013-11-21: 1334 mg via ORAL
  Filled 2013-11-21 (×4): qty 2

## 2013-11-21 MED ORDER — SODIUM CHLORIDE 0.9 % IV SOLN: 100.0000 mL | INTRAVENOUS | Status: DC | PRN

## 2013-11-21 MED ORDER — ONDANSETRON HCL 4 MG PO TABS: 4.0000 mg | ORAL_TABLET | Freq: Four times a day (QID) | ORAL | Status: DC | PRN

## 2013-11-21 MED ORDER — AMLODIPINE BESYLATE 5 MG PO TABS
5.0000 mg | ORAL_TABLET | Freq: Every day | ORAL | Status: DC
  Administered 2013-11-21: 5 mg via ORAL
  Filled 2013-11-21: qty 1

## 2013-11-21 MED ORDER — ACETAMINOPHEN 650 MG RE SUPP: 650.0000 mg | Freq: Four times a day (QID) | RECTAL | Status: DC | PRN

## 2013-11-21 MED ORDER — PENTAFLUOROPROP-TETRAFLUOROETH EX AERO: 1.0000 "application " | INHALATION_SPRAY | CUTANEOUS | Status: DC | PRN

## 2013-11-21 MED ORDER — LATANOPROST 0.005 % OP SOLN
1.0000 [drp] | Freq: Every day | OPHTHALMIC | Status: DC
  Filled 2013-11-21: qty 2.5

## 2013-11-21 MED ORDER — HEPARIN SODIUM (PORCINE) 5000 UNIT/ML IJ SOLN
5000.0000 [IU] | Freq: Three times a day (TID) | INTRAMUSCULAR | Status: DC
  Filled 2013-11-21 (×3): qty 1

## 2013-11-21 MED ORDER — ALTEPLASE 2 MG IJ SOLR
2.0000 mg | Freq: Once | INTRAMUSCULAR | Status: DC | PRN
  Filled 2013-11-21: qty 2

## 2013-11-21 MED ORDER — ALBUTEROL SULFATE HFA 108 (90 BASE) MCG/ACT IN AERS: 1.0000 | INHALATION_SPRAY | Freq: Four times a day (QID) | RESPIRATORY_TRACT | Status: DC | PRN

## 2013-11-21 MED ORDER — SODIUM CHLORIDE 0.9 % IJ SOLN: 3.0000 mL | Freq: Two times a day (BID) | INTRAMUSCULAR | Status: DC

## 2013-11-21 MED ORDER — ALBUTEROL SULFATE (2.5 MG/3ML) 0.083% IN NEBU: 2.5000 mg | INHALATION_SOLUTION | Freq: Four times a day (QID) | RESPIRATORY_TRACT | Status: DC | PRN

## 2013-11-21 MED ORDER — TRAZODONE 25 MG HALF TABLET
25.0000 mg | ORAL_TABLET | Freq: Every day | ORAL | Status: DC
  Filled 2013-11-21: qty 1

## 2013-11-21 MED ORDER — ACETAMINOPHEN 325 MG PO TABS: 650.0000 mg | ORAL_TABLET | Freq: Four times a day (QID) | ORAL | Status: DC | PRN

## 2013-11-21 MED ORDER — TRAZODONE 25 MG HALF TABLET
25.0000 mg | ORAL_TABLET | Freq: Every day | ORAL | Status: DC
Start: 2013-11-21 — End: 2013-12-28

## 2013-11-21 MED ORDER — SODIUM CHLORIDE 0.9 % IV SOLN: 250.0000 mL | INTRAVENOUS | Status: DC | PRN

## 2013-11-21 MED ORDER — RENA-VITE PO TABS
1.0000 | ORAL_TABLET | Freq: Every day | ORAL | Status: DC
Start: 2013-11-21 — End: 2013-11-21
  Filled 2013-11-21: qty 1

## 2013-11-21 MED ORDER — ONDANSETRON HCL 4 MG/2ML IJ SOLN: 4.0000 mg | Freq: Four times a day (QID) | INTRAMUSCULAR | Status: DC | PRN

## 2013-11-21 MED ORDER — FOLIC ACID 1 MG PO TABS
1.0000 mg | ORAL_TABLET | Freq: Every day | ORAL | Status: DC
  Filled 2013-11-21 (×2): qty 1

## 2013-11-21 MED ORDER — METOPROLOL TARTRATE 50 MG PO TABS
50.0000 mg | ORAL_TABLET | Freq: Two times a day (BID) | ORAL | Status: DC
Start: 2013-11-21 — End: 2013-11-21
  Administered 2013-11-21: 50 mg via ORAL
  Filled 2013-11-21 (×2): qty 1

## 2013-11-21 MED ORDER — LIDOCAINE HCL (PF) 1 % IJ SOLN: 5.0000 mL | INTRAMUSCULAR | Status: DC | PRN

## 2013-11-21 MED ORDER — LIDOCAINE-PRILOCAINE 2.5-2.5 % EX CREA: 1.0000 "application " | TOPICAL_CREAM | CUTANEOUS | Status: DC | PRN

## 2013-11-21 MED ORDER — DOCUSATE SODIUM 100 MG PO CAPS
100.0000 mg | ORAL_CAPSULE | Freq: Every day | ORAL | Status: DC
Start: 2013-11-21 — End: 2013-11-21
  Administered 2013-11-21: 100 mg via ORAL
  Filled 2013-11-21: qty 1

## 2013-11-21 MED ORDER — IPRATROPIUM BROMIDE 0.02 % IN SOLN
0.5000 mg | Freq: Two times a day (BID) | RESPIRATORY_TRACT | Status: DC
  Filled 2013-11-21: qty 2.5

## 2013-11-21 MED ORDER — SODIUM CHLORIDE 0.9 % IJ SOLN: 3.0000 mL | INTRAMUSCULAR | Status: DC | PRN

## 2013-11-21 MED ORDER — PRAZOSIN HCL 2 MG PO CAPS
4.0000 mg | ORAL_CAPSULE | Freq: Every day | ORAL | Status: DC
Start: 2013-11-21 — End: 2013-11-21
  Filled 2013-11-21: qty 2

## 2013-11-21 MED ORDER — ASPIRIN EC 81 MG PO TBEC
81.0000 mg | DELAYED_RELEASE_TABLET | Freq: Every day | ORAL | Status: DC
  Administered 2013-11-21: 81 mg via ORAL
  Filled 2013-11-21: qty 1

## 2013-11-21 MED ORDER — HEPARIN SODIUM (PORCINE) 1000 UNIT/ML DIALYSIS: 20.0000 [IU]/kg | INTRAMUSCULAR | Status: DC | PRN

## 2013-11-21 NOTE — Progress Notes (Signed)
Patient requesting to go off the floor to [facility]. MD notified of patients request. MD stated patient could go off the floor.

## 2013-11-21 NOTE — Progress Notes (Signed)
Attempted to recieve Report from ED Nurse. Stated she was busy and would call back.   [employer], RN  [facility] [place] [phone]

## 2013-11-21 NOTE — ED Notes (Signed)
Pt taken to dialysis 

## 2013-11-21 NOTE — H&P (Signed)
  Date: 11/21/2013  Patient name: Carl Benson  Medical record number: [id number]  Date of birth: 11/13/1955   I have seen and evaluated Margie Billet and discussed their care with the Residency Team.  Briefly, Mr. Rudisill is a 58yo man with PMH of ESRD, HTN, HCV with cirrhosis and COPD who presents with N/V/Abdominal pain, SOB.  He has recently relocated to the area from [place] and was homeless, living in a hotel.  He had been set up with outpatient dialysis, however, did not have HD on Thursday and did not tolerate a full session on Saturday.  He presented with volume overload and hyperkalemia.  He reported not taking any of his medications since discharge.  EKG was reviewed and is noted in the resident note.   Assessment and Plan: I have seen and evaluated the patient as outlined above. I agree with the formulated Assessment and Plan as detailed in the residents' admission note, with the following changes:   1. Hyperkalemia/volume overload/ESRD: K was 7.7.  He received calcium, insulin and glucose in the ED.  Nephrology was contacted for emergent dialysis.  Patient will be monitored on telemetry and electrolytes followed closely.    2. Uncontrolled HTN: Would recommend adding back home amlodipine.  This was held on discharge last time due to normotensive state.    Other symptoms included elevated AG acidosis, N/V are likely related to uremia and need for dialysis.  This will be arranged.   Other issues per resident note.   Inez Catalina, MD 1/12/20158:50 AM

## 2013-11-21 NOTE — Progress Notes (Signed)
11/21/2013 11:02 AM  Pt approached me at the desk wanting to sign out AMA because he had "too much personal stuff to take care of...go to the social security office, get a form of ID, go pick up prescriptions at [facility], etc." I explained to the patient that renal still had to see the patient to decide if he needed dialysis today or not, and that the primary physician had not yet discharged him.  He verbalized understanding of this, but still insisted he had too much to do to wait around on the doctors.  Pt signed AMA form at the front desk.  Dr. Johna Roles notified, no further orders received at this time to provide patient with at DC, other than the MD may call another prescription into his pharmacy to be picked up.  I relayed this info to the patient, to which he verbalized understanding.  I also stressed the importance of the patient going to his scheduled outpatient dialysis tomorrow at [facility], to which he verbalized that he would go to.  I called the patient ride for him, and we escorted him down to the main lobby of the [facility].  Theadora Rama

## 2013-11-21 NOTE — Progress Notes (Signed)
Patient became very combative stating that he can not stay in this room and he needs to be on [facility] where he is familiar with the unit and staff. Advised him that this is the room that he has been assigned to this room and that we provide the same care throughout the hospital to make him comfortable. He was not happy with that response and stated that he wants to leave AMA. Papers were given to him and patient signed the papers. MDs were notified and they came up to speak with him. Patient decided to stay and was willing to be moved to another unit. Patient did remove his own IV and took his Telemetry off. CCMD was notified of the telemetry removal. Patient has calmed down and has now put his own clothes on. On coming Nurse Britt Bottom) is aware of patients status and MDs are still at the bedside.   [employer], RN  [facility] [place] [phone]

## 2013-11-21 NOTE — Procedures (Signed)
Patient was seen on dialysis and the procedure was supervised.  BFR 400  Via avg BP is  147/99   Patient appears to be tolerating treatment well.  Emergent HD for hyperkalemia   Edra Riccardi A 11/21/2013

## 2013-11-21 NOTE — Discharge Summary (Signed)
Name: [name] A Garske MRN: [id number] DOB: 03/17/1956 58 y.o. PCP: No primary provider on file.  Date of Admission: 11/11/2013  8:30 AM Date of Discharge: 11/17/2013 Attending Physician: Dillard CannonEmily B. Criselda PeachesMullen, MD  Discharge Diagnosis:  Principal Problem:   Hyperkalemia Active Problems:   COPD (chronic obstructive pulmonary disease)   Hep C w/ coma, chronic   ESRD on hemodialysis   Ascites   Substance abuse   PAF (paroxysmal atrial fibrillation)   HTN (hypertension)   Cirrhosis of liver due to hepatitis C   PTSD (post-traumatic stress disorder)   Depression  Discharge Medications:   Medication List    STOP taking these medications       albuterol-ipratropium 18-103 MCG/ACT inhaler  Commonly known as:  COMBIVENT     amLODipine 10 MG tablet  Commonly known as:  NORVASC     ARIPiprazole 5 MG tablet  Commonly known as:  ABILIFY     budesonide-formoterol 80-4.5 MCG/ACT inhaler  Commonly known as:  SYMBICORT     carvedilol 25 MG tablet  Commonly known as:  COREG     diltiazem 120 MG 24 hr capsule  Commonly known as:  DILACOR XR     hydrOXYzine 50 MG tablet  Commonly known as:  ATARAX/VISTARIL     senna 8.6 MG Tabs tablet  Commonly known as:  SENOKOT      TAKE these medications       albuterol 108 (90 BASE) MCG/ACT inhaler  Commonly known as:  PROVENTIL HFA;VENTOLIN HFA  Inhale 1-2 puffs into the lungs every 6 (six) hours as needed for wheezing or shortness of breath.     aspirin 81 MG EC tablet  Take 1 tablet (81 mg total) by mouth daily.     b complex-vitamin c-folic acid 0.8 MG Tabs tablet  Take 1 tablet by mouth at bedtime.     calcium acetate 667 MG capsule  Commonly known as:  PHOSLO  Take 2 capsules (1,334 mg total) by mouth 3 (three) times daily with meals.     docusate sodium 100 MG capsule  Commonly known as:  COLACE  Take 1 capsule (100 mg total) by mouth daily.     folic acid 1 MG tablet  Commonly known as:  FOLVITE  Take 1 tablet (1 mg total)  by mouth daily.     latanoprost 0.005 % ophthalmic solution  Commonly known as:  XALATAN  Place 1 drop into both eyes at bedtime. Affected eye     metoprolol 50 MG tablet  Commonly known as:  LOPRESSOR  Take 1 tablet (50 mg total) by mouth 2 (two) times daily.     prazosin 2 MG capsule  Commonly known as:  MINIPRESS  Take 2 capsules (4 mg total) by mouth at bedtime.     traZODone 25 mg Tabs tablet  Commonly known as:  DESYREL  Take 0.5 tablets (25 mg total) by mouth at bedtime.        Disposition and follow-up:   Mr.Ilay A Wallace CullensGray was [facility] in Good condition.  At the hospital follow up visit please address:  1.  Compliance with medications and hemodialysis        [facility]eds [facility] with [facility] - [facility]    2.  Labs / imaging needed at time of follow-up: none  3.  Pending labs/ test needing follow-up: none  Follow-up Appointments:     Follow-up Information   Follow [facility] with [facility] On 11/18/2013. (Between 8:-00 AM - 12 PM)    Contact information:   [facility] [address] [place] [address] [place], [place] [id number].      Discharge Instructions: Discharge Orders   Future Orders Complete By Expires   Increase activity slowly  As directed       Consultations: Treatment Team:  Maree Krabbe, MD  Procedures Performed:  Dg Chest 2 View  11/11/2013   CLINICAL DATA:  Shortness of breath  EXAM: CHEST  2 VIEW  COMPARISON:  None.  FINDINGS: Low lung volumes. Cardiac silhouette is enlarged. Aorta is tortuous. Mild prominence of the interstitial markings. There is blunting of the costophrenic angles. Minimal areas of increased density project within lung bases. No focal regions of consolidation. The osseous structures unremarkable.  IMPRESSION: Cardiomegaly. Pulmonary vascular congestion. Likely atelectasis within the lung bases. Trace bilateral effusions also a diagnostic consideration.    Electronically Signed   By: Salome Holmes M.D.   On: 11/11/2013 09:33   US Paracentesis  11/12/2013   CLINICAL DATA:  Abdominal pain, history of hepatitis-C. Ascites. Request diagnostic and therapeutic paracentesis.  EXAM: ULTRASOUND GUIDED PARACENTESIS  COMPARISON:  None.  PROCEDURE: An ultrasound guided paracentesis was thoroughly discussed with the patient and questions answered. The benefits, risks, alternatives and complications were also discussed. The patient understands and wishes to proceed with the procedure. Written consent was obtained.  Ultrasound was performed to localize and mark an adequate pocket of fluid in the right lower quadrant of the abdomen. The area was then prepped and draped in the normal sterile fashion. 1% Lidocaine was used for local anesthesia. Under ultrasound guidance a 19 gauge Yueh catheter was introduced. Paracentesis was performed. The catheter was removed and a dressing applied.  COMPLICATIONS: None immediate  FINDINGS: A total of approximately 1.3 L of clear, dark yellow fluid was removed. A fluid sample was sent for laboratory analysis.  IMPRESSION: Successful ultrasound guided paracentesis yielding 1.3 L of ascites.  Read by: Brayton El PA-C   Electronically Signed   By: Richarda Overlie M.D.   On: 11/12/2013 10:24   Dg Abd Acute W/chest  11/20/2013   CLINICAL DATA:  Abdominal pain  EXAM: ACUTE ABDOMEN SERIES (ABDOMEN 2 VIEW & CHEST 1 VIEW)  COMPARISON:  None.  FINDINGS: Cardiac shadow is enlarged.  The lungs are clear bilaterally.  Scattered large and small bowel gas is seen. The stomach is distended with air. No free air is noted. No abnormal mass or abnormal calcifications are noted. No bony abnormality is seen.  IMPRESSION: No acute abnormality noted   Electronically Signed   By: Alcide Clever M.D.   On: 11/20/2013 20:07    2D Echo: none  Cardiac Cath: none  Admission HPI: Original Note by Lorretta Harp, MD  Patient is 58 yo man with past medical history of ESRD on  dialysis, HTN, HCV and ascites, COPD, who presents with nausea, vomiting, diarrhea, abdominal distension and pain.   Patient states that he was living in [place] alone until yesterday. He has two sons in [place] and decided to come back to [place]. He took long distant bus overnight and arrived at [facility] in early morning. He reports that he was supposed to do HD on M/W/F normally, but due to holiday, his HD scheduled was changed. His last HD was on Tuesday (3 days ago, 11/08/13). He states that he started having nausea, vomiting and diarrhea since yesterday. He vomited proximately 5 times, without  blood in the vomitus. He had 4-5 times of watery diarrhea without blood in it. He denies recent antibiotics use. He has hx of HCV and developed ascites. He reports having abdominal distension and mild abdominal pain. No fever or chills. He would like to have paracentesis. Last paracentesis was done approximately one month ago per patient. He could not tell whether he had infection (SBP) or not.   Patient was found to have K of 7.3 in ED without peaked T wave change, but EKG showed TWI in lateral and V4 to V6. He denies change pain. Patient was treated with insulin and Kayexalate in ED.   Patient has COPD and is using inhalers at home, but can not tell what inhalers he is actually using. He has productive    Hospital Course by problem list:   Hyperkalemia -  Pt presented with potassium of 7.3 without peaked T wave change, but EKG showed TWI in lateral and V4 to V6EKG changes. Pt received IV insulin and kayexalate in ED and emergent HD with normalization of levels.   End Stage Renal Disease on HD - Pt is on MWF schedule and received emergent HD due to hyperkalemia on admission that resolved with HD. Pt was continued on renal diet and rena-vit. Pt was followed by nephrology during hospitalization. Pt accepted to HD at [facility] where he will be on TTS schedule beginning 11/19/13.    Atrial  Fibrillation with RVR- Pt with history of atrial fibrillation on diltiazem and carvedilol for rate control and aspirin for [facility] with atrial fibrillation during hospitalization that resolved after receiving IV diltiazem. He was transitioned to metoprolol 50 mg BID and to continue on discharge. Pt remained in normal sinus rhythm with normal rate for remainder of hospitalization. Pt was also continued on 81 mg aspirin daily and to continue on discharge.    Homelessness - Pt is [facility] veteran who abruptly moved from [place]. He is currently homeless, however receives adequate money payments from [place] monthly. Pt with 2 sons in town however has not had contact in past 5 years. Pt to stay at [facility] in [place] and set up to  attend  HD center at [facility] (to start on Sat on TTS). Due to patient's stable income he did not qualify for free transportation to and from HD.  Pt to [facility] near [facility].   Secondary Hyperparathyroidism - Pt with elevated PTH with normal corrected calcium & phosphorus. Pt was continued on phoslo, hectorol, and vitamin D (weekly) during hospitalization.    Hypoalbuminemia - Pt with chronic low albumin levels most likely due to chronic cirrhosis vs protein-calorie malnutrition. Pt was continued on renal diet during hospitalization.    Hypertension - Patient with blood pressure range of 112/69 - 184/131 during hospitalization. Pt at home on diltiazem and metoprolol (amlodidpine?). Pt was continued on metoprolol 50 mg BID during hospitalization and to continue on discharge.    Chronic liver cirrhosis with ascites - Pt with chronic liver cirrhosis most likely due to chronic Hepatitis C virus. Diagnostic and therapeutic (1.3L) paracentesis performed on 1/3 with no PMN>250 or single organism on culture to suggest SBP. Cytology of ascites fluid form 11/11/13 with reactive appearing mesothelial cells. Pt's abdominal pain and distension  resolved with HD.   Microcytic anemia - Pt with stable hemoglobin during hospitalizaiton without active bleeding or hemodynamic instability requiring blood transfusion. Etiology most likely due to CKD. Pt was continued on ESA with  darbepotein weekly   Thrombocytopenia - Pt with chronic thrombocytopenia due to chronic liver cirrhosis with stable platelet count with no active bleeding during hospitalization.    COPD - Pt with no exacerbations requiring steroid use. Chest xray with atelectasis. Pt with reported productive cough for 1 month and mild dyspnea on admission, with no wheezing or chest tightness. Pt on symbicort at home. Pt is a chronic heavy smoker with 50 pack history. Pt received breathing treatments as needed for acute bronchospasm   Substance Abuse - Pt with history of cocaine, marijuana, and tobacco abuse (1 pack 50 yrs). Pt was encouraged to  drug cessation   Insomnia--Pt with difficulty sleeping which improved with ambien as needed and continuation of home prazosin and trazodone. Pt to continue trazodone (25 mg daily) and prazosin (4 mg daily) which were called into to pharmacy.  PTSD & Bipolar disrder - Pt with mood disturbance during hospitalization. Pt on aripiprazole at home that was not continued during hospitalization.   DVT Prophylaxis - Pt received SQ heparin during hospitalization without evidence of DVT or HIT syndrome.    Discharge Vitals:   BP 142/67  Pulse 78  Temp(Src) 98.6 F (37 C) (Oral)  Resp 18  Ht 5\' 5"  (1.651 m)  Wt 59.2 kg (130 lb 8.2 oz)  BMI 21.72 kg/m2  SpO2 100%  Discharge Labs:  No results found for this or any previous visit (from the past 24 hour(s)).  Signed: Otis Brace, MD 11/21/2013, 3:25 AM   Time Spent on Discharge: 60 minutes Services Ordered on Discharge: none Equipment Ordered on Discharge: none

## 2013-11-21 NOTE — Consult Note (Signed)
Reason for Consult: To manage dialysis and dialysis related needs Referring Physician: Dr. Charlesetta Ivory Caban is an 58 y.o. male with HTN, hepatitis C with history of ascites, COPD as well as ESRD started on dialysis in January of 2014 in [place].  He showed up here in [place] and required hospitalization from 1/2-1/7 with complaints of n/v, edema and he had hyperkalemia on presentation as well.  He was discharged and supposed to go to the [facility] unit on Saturday but apparently signed off after 2 hours.  He also states that he hasnt taken any meds since he was discharged.  He presents tonight to the ER with complaints of abdominal pain.  Labs showed a K of 7.7 so will has needed emergent HD tonight.  He now has a K of 4.8 after being on HD for an hour.     Dialyzes at [facility] via [facility]- TTS other OP HD orders unknown at this time  Past Medical History  Diagnosis Date  . COPD (chronic obstructive pulmonary disease)   . Hypertension   . Hep C w/ coma, chronic   . Irregular heartbeat   . ESRD (end stage renal disease) on dialysis     /notes 11/11/2013  . Smoker unmotivated to quit   . Active smoker     Past Surgical History  Procedure Laterality Date  . Paracentesis  ~ 10/2013    Archie Endo 11/11/2013    No family history on file.  Social History:  reports that he has been smoking Cigarettes.  He has a 22.5 pack-year smoking history. He does not have any smokeless tobacco history on file. He reports that he uses illicit drugs ("Crack" cocaine, Cocaine, Marijuana, and Other-see comments). He reports that he does not drink alcohol.  Allergies: No Known Allergies  Medications: his previous OP list (that he was not taking) have been reviewed     Results for orders placed during the hospital encounter of 11/20/13 (from the past 48 hour(s))  CBC WITH DIFFERENTIAL     Status: Abnormal   Collection Time    11/20/13  8:41 PM      Result Value Range   WBC 8.4  4.0 - 10.5 K/uL   RBC  5.21  4.22 - 5.81 MIL/uL   Hemoglobin 14.1  13.0 - 17.0 g/dL   HCT 40.4  39.0 - 52.0 %   MCV 77.5 (*) 78.0 - 100.0 fL   MCH 27.1  26.0 - 34.0 pg   MCHC 34.9  30.0 - 36.0 g/dL   RDW 22.2 (*) 11.5 - 15.5 %   Platelets 108 (*) 150 - 400 K/uL   Comment: PLATELET COUNT CONFIRMED BY SMEAR     REPEATED TO VERIFY   Neutrophils Relative % 66  43 - 77 %   Lymphocytes Relative 20  12 - 46 %   Monocytes Relative 14 (*) 3 - 12 %   Eosinophils Relative 0  0 - 5 %   Basophils Relative 0  0 - 1 %   Neutro Abs 5.5  1.7 - 7.7 K/uL   Lymphs Abs 1.7  0.7 - 4.0 K/uL   Monocytes Absolute 1.2 (*) 0.1 - 1.0 K/uL   Eosinophils Absolute 0.0  0.0 - 0.7 K/uL   Basophils Absolute 0.0  0.0 - 0.1 K/uL   RBC Morphology POLYCHROMASIA PRESENT     Comment: TARGET CELLS     SPHEROCYTES     SCHISTOCYTES PRESENT (2-5/hpf)  PROTIME-INR  Status: Abnormal   Collection Time    11/20/13  8:41 PM      Result Value Range   Prothrombin Time 18.4 (*) 11.6 - 15.2 seconds   INR 1.58 (*) 0.00 - 1.49  APTT     Status: None   Collection Time    11/20/13  8:41 PM      Result Value Range   aPTT 27  24 - 37 seconds  TROPONIN I     Status: None   Collection Time    11/20/13  8:41 PM      Result Value Range   Troponin I <0.30  <0.30 ng/mL   Comment:            Due to the release kinetics of cTnI,     a negative result within the first hours     of the onset of symptoms does not rule out     myocardial infarction with certainty.     If myocardial infarction is still suspected,     repeat the test at appropriate intervals.  LIPASE, BLOOD     Status: Abnormal   Collection Time    11/20/13  8:41 PM      Result Value Range   Lipase 101 (*) 11 - 59 U/L  COMPREHENSIVE METABOLIC PANEL     Status: None   Collection Time    11/20/13  9:35 PM      Result Value Range   Sodium DUPLICATE REQUEST  732 - 147 mEq/L   Potassium DUPLICATE REQUEST  3.7 - 5.3 mEq/L   Chloride DUPLICATE REQUEST  96 - 202 mEq/L   CO2 DUPLICATE  REQUEST  19 - 32 mEq/L   Glucose, Bld DUPLICATE REQUEST  70 - 99 mg/dL   BUN DUPLICATE REQUEST  6 - 23 mg/dL   Creatinine, Ser DUPLICATE REQUEST  5.42 - 1.35 mg/dL   Calcium DUPLICATE REQUEST  8.4 - 10.5 mg/dL   Total Protein DUPLICATE REQUEST  6.0 - 8.3 g/dL   Albumin DUPLICATE REQUEST  3.5 - 5.2 g/dL   AST DUPLICATE REQUEST  0 - 37 U/L   ALT DUPLICATE REQUEST  0 - 53 U/L   Alkaline Phosphatase DUPLICATE REQUEST  39 - 117 U/L   Total Bilirubin DUPLICATE REQUEST  0.3 - 1.2 mg/dL   GFR calc non Af Amer DUPLICATE REQUEST  >70 mL/min   GFR calc Af Amer DUPLICATE REQUEST  >62 mL/min  COMPREHENSIVE METABOLIC PANEL     Status: Abnormal   Collection Time    11/20/13 10:16 PM      Result Value Range   Sodium 131 (*) 137 - 147 mEq/L   Potassium >7.7 (*) 3.7 - 5.3 mEq/L   Comment: RESULTS VERIFIED VIA RECOLLECT     REPEATED TO VERIFY     CRITICAL RESULT CALLED TO, READ BACK BY AND VERIFIED WITH:     E NEWNAM,RN 11/20/13 2301 RHOLMES   Chloride 82 (*) 96 - 112 mEq/L   CO2 16 (*) 19 - 32 mEq/L   Glucose, Bld 57 (*) 70 - 99 mg/dL   BUN 68 (*) 6 - 23 mg/dL   Creatinine, Ser 6.78 (*) 0.50 - 1.35 mg/dL   Calcium 9.7  8.4 - 10.5 mg/dL   Total Protein 8.5 (*) 6.0 - 8.3 g/dL   Albumin 3.2 (*) 3.5 - 5.2 g/dL   AST 119 (*) 0 - 37 U/L   Comment: HEMOLYSIS AT THIS LEVEL MAY AFFECT RESULT   ALT 74 (*)  0 - 53 U/L   Alkaline Phosphatase 163 (*) 39 - 117 U/L   Total Bilirubin 5.6 (*) 0.3 - 1.2 mg/dL   GFR calc non Af Amer 8 (*) >90 mL/min   GFR calc Af Amer 9 (*) >90 mL/min   Comment: (NOTE)     The eGFR has been calculated using the CKD EPI equation.     This calculation has not been validated in all clinical situations.     eGFR's persistently <90 mL/min signify possible Chronic Kidney     Disease.  GLUCOSE, CAPILLARY     Status: Abnormal   Collection Time    11/20/13 11:34 PM      Result Value Range   Glucose-Capillary 48 (*) 70 - 99 mg/dL   Comment 1 Documented in Chart    GLUCOSE, CAPILLARY      Status: Abnormal   Collection Time    11/21/13 12:05 AM      Result Value Range   Glucose-Capillary 108 (*) 70 - 99 mg/dL  GLUCOSE, CAPILLARY     Status: Abnormal   Collection Time    11/21/13 12:49 AM      Result Value Range   Glucose-Capillary 187 (*) 70 - 99 mg/dL    Dg Abd Acute W/chest  11/20/2013   CLINICAL DATA:  Abdominal pain  EXAM: ACUTE ABDOMEN SERIES (ABDOMEN 2 VIEW & CHEST 1 VIEW)  COMPARISON:  None.  FINDINGS: Cardiac shadow is enlarged.  The lungs are clear bilaterally.  Scattered large and small bowel gas is seen. The stomach is distended with air. No free air is noted. No abnormal mass or abnormal calcifications are noted. No bony abnormality is seen.  IMPRESSION: No acute abnormality noted   Electronically Signed   By: Inez Catalina M.D.   On: 11/20/2013 20:07    ROS: Reportedly did not wish to repeat his complaints.  According to previous notes, non productive cough, abdominal pain with vomiting and also lower extremity edema Blood pressure 172/108, pulse 103, temperature 98.3 F (36.8 C), temperature source Oral, resp. rate 24, weight 0 kg (0 lb), SpO2 100.00%. General: Unkempt, unpleasant, looks chronically ill, NAD- he has had requests for pain meds, food and warm blankets.  HEENT-PERRLA, mucous membranes moist. Lungs: decreased BS at bases CV- RRR, no murmer Abd- distended, diffusely tender Ext- pitting edema bilaterally- RUE AVG is patent  Assessment/Plan: 58 year old male with medical problems to include hep C with ascites and esrd who so far has demonstrated medical noncompliance and has hyperkalemia associated with missed HD requiring emergent HD 1 Hyperkalemia- due to missed HD and dietary indiscretion.  Emergent HD to get K down, already better 2 ESRD: [facility].  Hopefully will be able to resume at discharge.  Next HD here on Tuesday 3 Hypertension: volume overload and also not taking OP meds.  Volume removal as able and resume  meds.  Get some type of plan in place so can get meds as op.   4. Anemia of ESRD: Does not appear to be an issue at this time.  No esa needed 5. Metabolic Bone Disease: is supposed to be on phoslo as OP.  Last pth here was 618.  Will locate his orders from OP unit and add back hectorol s well 6. Abdominal complaints- suspect these are chronic in nature and related to his liver disease.  He also could have an element of uremia causing vomiting as well which dialysis should help  fix.  Other complaints  Per primary team   Kawan Valladolid A 11/21/2013, 2:42 AM

## 2013-11-21 NOTE — Care Management Note (Addendum)
   CARE MANAGEMENT NOTE 11/21/2013  Patient:  Carl Benson, Carl Benson   Account Number:  [id number]  Date Initiated:  11/21/2013  Documentation initiated by:  Savreen Gebhardt  Subjective/Objective Assessment:   Noted request for assistance with VA applications.     Action/Plan:   CM is unable to assist with VA paparwork, will provide phone numbers however it appears pt has already started the process and will need to complete this himself.   Anticipated DC Date:     Anticipated DC Plan:           Choice offered to / List presented to:             Status of service:  In process, will continue to follow Medicare Important Message given?   (If response is "NO", the following Medicare IM given date fields will be blank) Date Medicare IM given:   Date Additional Medicare IM given:    Discharge Disposition:    Per UR Regulation:    If discussed at Long Length of Stay Meetings, dates discussed:    Comments:   11/21/13 As pt has just recently relocated to [facility], he most likely has not had time to apply for [place] services in [place]. Living in [place], he would be assigned to the [place] in [place], however [facility] would be his center for MD and medications most likely. Pt provided with phone number for [facility] [phone] in d/c instructions. The pt is required to make his own contact as he is not disabled.  Johny Shock RN MPH, case manager, [phone]

## 2013-11-21 NOTE — Discharge Summary (Signed)
PATIENT LEFT AGAINST MEDICAL ADVICE Name: Carl Benson MRN: [id number] DOB: 17-Feb-1956 58 y.o. PCP: No primary provider on file.  Date of Admission: 11/20/2013  6:55 PM Date of Discharge: 11/21/2013 Attending Physician: Dillard Cannon. Criselda Peaches, MD  Discharge Diagnosis:  Principal Diagnosis: Hyperkalemia   Active Problems: Uremia in setting of End Stage Renal Disease Anion Gap Metabolic Acidosis Hypertension Atrial Fibrillation  Homelessness  Secondary Hyperparathyroidism Hypoalbuminemia  Hypertension  Chronic liver cirrhosis with ascites  Microcytic anemia Thrombocytopenia COPD  Substance Abuse  Insomnia PTSD & Bipolar disrder   Discharge Medications:   Medication List    TAKE these medications       amLODipine 10 MG tablet  Commonly known as:  NORVASC  Take 1 tablet (10 mg total) by mouth daily.      ASK your doctor about these medications       albuterol 108 (90 BASE) MCG/ACT inhaler  Commonly known as:  PROVENTIL HFA;VENTOLIN HFA  Inhale 1-2 puffs into the lungs every 6 (six) hours as needed for wheezing or shortness of breath.     aspirin 81 MG EC tablet  Take 1 tablet (81 mg total) by mouth daily.     b complex-vitamin c-folic acid 0.8 MG Tabs tablet  Take 1 tablet by mouth at bedtime.     calcium acetate 667 MG capsule  Commonly known as:  PHOSLO  Take 2 capsules (1,334 mg total) by mouth 3 (three) times daily with meals.     docusate sodium 100 MG capsule  Commonly known as:  COLACE  Take 1 capsule (100 mg total) by mouth daily.     folic acid 1 MG tablet  Commonly known as:  FOLVITE  Take 1 tablet (1 mg total) by mouth daily.     latanoprost 0.005 % ophthalmic solution  Commonly known as:  XALATAN  Place 1 drop into both eyes at bedtime. Affected eye     metoprolol 50 MG tablet  Commonly known as:  LOPRESSOR  Take 1 tablet (50 mg total) by mouth 2 (two) times daily.     prazosin 2 MG capsule  Commonly  known as:  MINIPRESS  Take 2 capsules (4 mg total) by mouth at bedtime.     traZODone 50 MG tablet  Commonly known as:  DESYREL  Take 25 mg by mouth at bedtime.     traZODone 25 mg Tabs tablet  Commonly known as:  DESYREL  Take 0.5 tablets (25 mg total) by mouth at bedtime.        Disposition and follow-up:   [name] was discharged from [facility] in Good condition.  At the hospital follow up visit please address:  1.  [facility]       Blood pressure - newly restarted home amlodipine       Heart rate on metoprolol (was previously on diltiazem)      Compliance with hemodialysis         Bipolar and PTSD (previously on aripiprazole?)     2.  Labs / imaging needed at time of follow-up: BMP (K)  3.  Pending labs/ test needing follow-up: none  Follow-up Appointments:     Follow-up Information   Call to follow up. Durwin Nora [facility]  [phone])       Discharge Instructions: Pt left AMA   Consultations: Treatment Team:  Maree Krabbe, MD  Procedures Performed:  Dg Chest 2 View  11/11/2013   CLINICAL DATA:  Shortness of breath  EXAM: CHEST  2 VIEW  COMPARISON:  None.  FINDINGS: Low lung volumes. Cardiac silhouette is enlarged. Aorta is tortuous. Mild prominence of the interstitial markings. There is blunting of the costophrenic angles. Minimal areas of increased density project within lung bases. No focal regions of consolidation. The osseous structures unremarkable.  IMPRESSION: Cardiomegaly. Pulmonary vascular congestion. Likely atelectasis within the lung bases. Trace bilateral effusions also a diagnostic consideration.   Electronically Signed   By: Salome Holmes M.D.   On: 11/11/2013 09:33   US Paracentesis  11/12/2013   CLINICAL DATA:  Abdominal pain, history of hepatitis-C. Ascites. Request diagnostic and therapeutic paracentesis.  EXAM: ULTRASOUND GUIDED PARACENTESIS  COMPARISON:  None.  PROCEDURE: An ultrasound  guided paracentesis was thoroughly discussed with the patient and questions answered. The benefits, risks, alternatives and complications were also discussed. The patient understands and wishes to proceed with the procedure. Written consent was obtained.  Ultrasound was performed to localize and mark an adequate pocket of fluid in the right lower quadrant of the abdomen. The area was then prepped and draped in the normal sterile fashion. 1% Lidocaine was used for local anesthesia. Under ultrasound guidance a 19 gauge Yueh catheter was introduced. Paracentesis was performed. The catheter was removed and a dressing applied.  COMPLICATIONS: None immediate  FINDINGS: A total of approximately 1.3 L of clear, dark yellow fluid was removed. A fluid sample was sent for laboratory analysis.  IMPRESSION: Successful ultrasound guided paracentesis yielding 1.3 L of ascites.  Read by: Brayton El PA-C   Electronically Signed   By: Richarda Overlie M.D.   On: 11/12/2013 10:24   Dg Abd Acute W/chest  11/20/2013   CLINICAL DATA:  Abdominal pain  EXAM: ACUTE ABDOMEN SERIES (ABDOMEN 2 VIEW & CHEST 1 VIEW)  COMPARISON:  None.  FINDINGS: Cardiac shadow is enlarged.  The lungs are clear bilaterally.  Scattered large and small bowel gas is seen. The stomach is distended with air. No free air is noted. No abnormal mass or abnormal calcifications are noted. No bony abnormality is seen.  IMPRESSION: No acute abnormality noted   Electronically Signed   By: Alcide Clever M.D.   On: 11/20/2013 20:07    2D Echo: none  Cardiac Cath: none  Admission HPI: Original Author Boykin Peek, MD  Pt is a 58 y.o. male with past medical history of ESRD on dialysis, HTN, HCV and ascites, COPD, who presents with N/V, abdominal pain, and SOB. He was discharged on 11/17/13 for similar complaints. He is from [place] and has two sons in [place] and decided to come back to [place]. He did not wish to answer our questions at the present time but  according to ED notes, he went to dialysis yesterday and only completed 2 hours because he felt sick.  Additionally, reported not having his medications since discharge. He reports he did not go to dialysis on Thursday and that he has just been set up to go to [employer]. He also c/o a non-productive cough without CP. He describes his abdominal pain as sharp with associated NBNB vomiting and constipation. He had a paracentesis during last hospitalization which yielded 1.3L of clear dark yellow fluid. Fluid gram stain revealed wbc's but no organisms seen. On previous  admission pt had K+ of 7.3 in ED without peaked T wave changes, but EKG showed TWI in lateral and V4 to V6 and was treated with insulin and kyexalate in ED.   In the ED, pt given calcium gluconate, insulin, glucose. Consult to nephrology was made for emergent dialysis. Pt is hypertensive. Patient cbg went down to 48 and given 2 amps of D50 prior to insulin.  Hospital Course by problem list:  Hyperkalemia - Pt presented with potassium level of 7.7 most likely due to missing HD on 1/8 and short duration of session on 1/10 and likely dietary indiscretion. Pt with no 12-lead EKG changes and cardiac enzyme was normal.  Pt received calcium, insulin, and glucose in the ED with emergent HD after which potassium levels normalized.   Anion Gap Metabolic Acidosis- Pt with AG of 44 on admission with low bicarbonate (16). Pt received sodium bicarbonate. Etiology most likely due to uremia and hyperphosphatemia  in setting inadequate HD.    Uremia in setting of End Stage Renal Disease - Pt recently set up to attend HD at [facility] on TTS schedule. Pt reported missing HD on 1/8 and short duration of session on 1/10.  Pt presented with abdominal pain, nausea, vomiting, and diarrhea and found to have hyperkalemia, hyperphosphatemia, and anion gap metabolic acidosis requiring emergent HD. Chest xray did not reveal pulmonary edema.  Pt was to be seen  by nephrologist in AM however left AMA. Per pt and nurse pt to attend HD on following day at [facility] HD center.    Hyperphosphatemia - Pt with elevated phosphorus in setting of inadequate HD. Pt received HD during hospitalization and left before nephrology could see pt in AM. Pt to pick up prescription at last hospitalization for phoslo at pharmacy after leaving AMA.    Hypertension - Pt reported not obtaining medications from pharmacy prescribed on last hospitalization . Blood pressure ranged from 135/97 to 180/120 during hospitalization. Pt received amlodipine and metoprolol during hospitalization. Per pt to pick-up medications after leaving AMA. Additional medication was called into pharmacy, amlodipine 10 mg (previously on home medication list).   Atrial Fibrillation - Pt with history of atrial fibrillation on diltiazem and carvedilol for rate control and aspirin for [facility] with atrial fibrillation during last hospitalization that resolved after receiving IV diltiazem.Pt was transitioned to metoprolol 50 mg BID. Pt reported not picking up medication from pharmacy. Pt remained in normal sinus rhythm with normal rate (93-99) during hospitalization. Pt was also continued on 81 mg aspirin. Pt to pick up prescriptions at pharmacy after leaving AMA.  Prolonged AT - Pt with QTc of 494 on 12-lead EKG.  QT prolonging medications were avoided during hospitalization.   Secondary Hyperparathyroidism - Pt with elevated PTH with normal corrected calcium & phosphorus. Pt continued on phoslo during hospitalization. Pt prescribed phoslo with meals at last hospitalization and per pt to pick up at pharmacy after leaving AMA. Per nephrology pt on weekly hectoral with HD.   Hypoalbuminemia - Pt with chronic low albumin levels most likely due to chronic cirrhosis vs protein-calorie malnutrition. Pt was continued on renal diet during hospitalization.   Chronic liver cirrhosis with ascites - Pt with chronic liver cirrhosis  most likely due to chronic Hepatitis C virus. No paracentesis was warranted. Pt's abdominal complaints resolved with HD.   Microcytic anemia - Pt with normal hemoglobin during hospitalization without active bleeding or hemodynamic instability requiring blood transfusion. Etiology most likely due to CKD.  Pt to receive  ESA and erythropoetin weekly at HD.  Thrombocytopenia - Pt with chronic thrombocytopenia due to chronic liver cirrhosis with stable platelet count with no active bleeding during hospitalization.   COPD - Pt with no exacerbations requiring steroid use. Chest xray without acute cardiopulmonary disease.  Pt is a chronic heavy smoker with 50 pack history. Pt received breathing treatments as needed for acute bronchospasm. Pt to pick up albuterol inhaler from pharmacy that was prescribed at last hospitalization.    Substance Abuse - Pt with history of cocaine, marijuana, and tobacco abuse (1 pack 50 yrs).  Insomnia--Pt to pick up prescription for trazodone and prazosin which were called into pharmacy on last hospitalizaiton.   PTSD & Bipolar disrder - Pt with mood disturbance during hospitalization and at times threatening to staff. It is unclear if pt was on aripiprazole at home and for this reason pt was not continued on this medication. [facility] of mood disorder.   Homelessness - Pt is [employer] veteran who abruptly moved from [place]. He is temporarily staying at [facility] in [place]. Pt receives adequate monthly payments from [place]. Pt with 2 sons in town however has not had contact in past 5 years. Pt set up to attend HD center at [facility] (TTS). Due to patient's stable income he does not qualify for free transportation to and from HD. Pt to pick-up medications that were phoned in from last hospitalization [facility] near [facility].    Discharge Vitals:   BP 135/97  Pulse 92  Temp(Src) 97.9 F (36.6 C)  (Oral)  Resp 20  Ht 5\' 4"  (1.626 m)  Wt 55.52 kg (122 lb 6.4 oz)  BMI 21.00 kg/m2  SpO2 96%  Discharge Labs:  Results for orders placed during the hospital encounter of 11/20/13 (from the past 24 hour(s))  GLUCOSE, CAPILLARY     Status: Abnormal   Collection Time    11/20/13 11:34 PM      Result Value Range   Glucose-Capillary 48 (*) 70 - 99 mg/dL   Comment 1 Documented in Chart    GLUCOSE, CAPILLARY     Status: Abnormal   Collection Time    11/21/13 12:05 AM      Result Value Range   Glucose-Capillary 108 (*) 70 - 99 mg/dL  GLUCOSE, CAPILLARY     Status: Abnormal   Collection Time    11/21/13 12:49 AM      Result Value Range   Glucose-Capillary 187 (*) 70 - 99 mg/dL  POTASSIUM     Status: None   Collection Time    11/21/13  1:49 AM      Result Value Range   Potassium 4.8  3.7 - 5.3 mEq/L  HEPATITIS B SURFACE ANTIGEN     Status: None   Collection Time    11/21/13  1:49 AM      Result Value Range   Hepatitis B Surface Ag NEGATIVE  NEGATIVE  MRSA PCR SCREENING     Status: None   Collection Time    11/21/13  5:37 AM      Result Value Range   MRSA by PCR NEGATIVE  NEGATIVE  GLUCOSE, CAPILLARY     Status: Abnormal   Collection Time    11/21/13  7:42 AM      Result Value Range   Glucose-Capillary 115 (*) 70 - 99 mg/dL  RENAL FUNCTION PANEL     Status: Abnormal  Collection Time    11/21/13  9:45 AM      Result Value Range   Sodium 138  137 - 147 mEq/L   Potassium 5.1  3.7 - 5.3 mEq/L   Chloride 91 (*) 96 - 112 mEq/L   CO2 24  19 - 32 mEq/L   Glucose, Bld 118 (*) 70 - 99 mg/dL   BUN 37 (*) 6 - 23 mg/dL   Creatinine, Ser 9.624.20 (*) 0.50 - 1.35 mg/dL   Calcium 9.2  8.4 - 95.210.5 mg/dL   Phosphorus 7.6 (*) 2.3 - 4.6 mg/dL   Albumin 2.8 (*) 3.5 - 5.2 g/dL   GFR calc non Af Amer 14 (*) >90 mL/min   GFR calc Af Amer 16 (*) >90 mL/min  MAGNESIUM     Status: None   Collection Time    11/21/13  9:45 AM      Result Value Range   Magnesium 2.3  1.5 - 2.5 mg/dL     Signed: Otis BraceMarjan Camary Sosa, MD 11/21/2013, 10:50 PM   Time Spent on Discharge: Pt left AMA Services Ordered on Discharge: Pt left AMA Equipment Ordered on Discharge: Pt left AMA

## 2013-11-21 NOTE — H&P (Signed)
Date: 11/21/2013               Patient Name:  Carl Benson MRN: [id number]  DOB: 1956-01-26 Age / Sex: 58 y.o., male   PCP: No primary provider on file.           Medical Service: Internal Medicine Teaching Service         Attending Physician: Dr. Sid Falcon, MD    First Contact: Dr. Juluis Mire, MD Pager: [phone]  Second Contact: Dr. Jerene Pitch Pager: [phone]       After Hours (After 5p/  First Contact Pager: [phone]  weekends / holidays): Second Contact Pager: [phone]    Most Recent Discharge Date:  11/17/13  Chief Complaint:  Chief Complaint  Patient presents with  . Abdominal Pain  . Emesis       History of present illness: Pt is a 58 y.o. male with past medical history of ESRD on dialysis, HTN, HCV and ascites, COPD, who presents with N/V, abdominal pain, and SOB.  He was discharged on 11/17/13 for similar complaints.  He is from [place] and has two sons in [place] and decided to come back to [place].  He did not wish to answer our questions at the present time but according to ED notes, he went to dialysis yesterday and only completed 2 hours because he felt sick.  Additionally, reported not having his medications since discharge.  He reports he did not go to dialysis on Thursday and that he has just been set up to go to [facility].  He also c/o a non-productive cough without CP.  He describes his abdominal pain as sharp with associated NBNB vomiting and constipation.  He had a paracentesis during last hospitalization which yielded 1.3L of clear dark yellow fluid.  Fluid gram stain revealed wbc's but no organisms seen.  On previous admission pt had K+ of 7.3 in ED without peaked T wave changes, but EKG showed TWI in lateral and V4 to V6 and was treated with insulin and kyexalate in ED.    In the ED, pt given calcium gluconate, insulin, glucose. Consult to nephrology was made for emergent dialysis. Pt is hypertensive. Patient cbg went down to 48 and  given 2 amps of D50 prior to insulin.    Meds: No current facility-administered medications for this encounter.   Current Outpatient Prescriptions  Medication Sig Dispense Refill  . traZODone (DESYREL) 50 MG tablet Take 25 mg by mouth at bedtime.      Marland Kitchen albuterol (PROVENTIL HFA;VENTOLIN HFA) 108 (90 BASE) MCG/ACT inhaler Inhale 1-2 puffs into the lungs every 6 (six) hours as needed for wheezing or shortness of breath.  1 Inhaler  0  . aspirin 81 MG EC tablet Take 1 tablet (81 mg total) by mouth daily.  30 tablet  3  . b complex-vitamin c-folic acid (NEPHRO-VITE) 0.8 MG TABS tablet Take 1 tablet by mouth at bedtime.  30 tablet  3  . calcium acetate (PHOSLO) 667 MG capsule Take 2 capsules (1,334 mg total) by mouth 3 (three) times daily with meals.  90 capsule  3  . docusate sodium (COLACE) 100 MG capsule Take 1 capsule (100 mg total) by mouth daily.  30 capsule  3  . folic acid (FOLVITE) 1 MG tablet Take 1 tablet (1 mg total) by mouth daily.  30 tablet  3  . latanoprost (XALATAN) 0.005 % ophthalmic solution Place 1 drop into both eyes at bedtime. Affected eye  2.5 mL  12  . metoprolol (LOPRESSOR) 50 MG tablet Take 1 tablet (50 mg total) by mouth 2 (two) times daily.  60 tablet  1  . prazosin (MINIPRESS) 2 MG capsule Take 2 capsules (4 mg total) by mouth at bedtime.  30 capsule  3    Allergies: Allergies as of 11/20/2013  . (No Known Allergies)    PMH: Past Medical History  Diagnosis Date  . COPD (chronic obstructive pulmonary disease)   . Hypertension   . Hep C w/ coma, chronic   . Irregular heartbeat   . ESRD (end stage renal disease) on dialysis     /notes 11/11/2013  . Smoker unmotivated to quit   . Active smoker     PSH: Past Surgical History  Procedure Laterality Date  . Paracentesis  ~ 10/2013    Archie Endo 11/11/2013    FH: No family history on file.  SH: History  Substance Use Topics  . Smoking status: Current Every Day Smoker -- 1.50 packs/day for 15 years     Types: Cigarettes  . Smokeless tobacco: Not on file  . Alcohol Use: No    Review of Systems: Pertinent items are noted in HPI.  Physical Exam: Blood pressure 165/108, pulse 97, temperature 97.4 F (36.3 C), temperature source Oral, resp. rate 18, SpO2 100.00%.  Physical Exam  Constitutional: He is oriented to person, place, and time. He appears unhealthy. He appears cachectic. No distress.  HENT:  Head: Normocephalic and atraumatic.  Eyes: Pupils are equal, round, and reactive to light.  Cardiovascular: Regular rhythm, normal heart sounds and intact distal pulses.   Pulmonary/Chest: Effort normal. No accessory muscle usage. Not tachypneic. No respiratory distress. He has decreased breath sounds. He has no wheezes. He has no rales.  Abdominal: Soft. Bowel sounds are normal. He exhibits distension. There is tenderness (diffuse). There is no rebound and no guarding.  Neurological: He is alert and oriented to person, place, and time.  Skin: Skin is warm and dry. He is not diaphoretic.  Extremities: 2+ pitting edema b/l  Lab results:  Basic Metabolic Panel:  Recent Labs  11/20/13 2135 52/77/82 4235  NA DUPLICATE REQUEST 361*  K DUPLICATE REQUEST >4.4*  CL DUPLICATE REQUEST 82*  CO2 DUPLICATE REQUEST 16*  GLUCOSE DUPLICATE REQUEST 57*  BUN DUPLICATE REQUEST 68*  CREATININE DUPLICATE REQUEST 3.15*  CALCIUM DUPLICATE REQUEST 9.7   Anion Gap:  33  Liver Function Tests:  Recent Labs  11/20/13 2135 40/08/67 6195  AST DUPLICATE REQUEST 093*  ALT DUPLICATE REQUEST 74*  ALKPHOS DUPLICATE REQUEST 267*  BILITOT DUPLICATE REQUEST 5.6*  PROT DUPLICATE REQUEST 8.5*  ALBUMIN DUPLICATE REQUEST 3.2*    Recent Labs  11/20/13 2041  LIPASE 101*   No results found for this basename: AMMONIA,  in the last 72 hours  CBC:    Component Value Date/Time   WBC 8.4 11/20/2013 2041   RBC 5.21 11/20/2013 2041   HGB 14.1 11/20/2013 2041   HCT 40.4 11/20/2013 2041   PLT 108* 11/20/2013  2041   MCV 77.5* 11/20/2013 2041   MCH 27.1 11/20/2013 2041   MCHC 34.9 11/20/2013 2041   RDW 22.2* 11/20/2013 2041   LYMPHSABS 1.7 11/20/2013 2041   MONOABS 1.2* 11/20/2013 2041   EOSABS 0.0 11/20/2013 2041   BASOSABS 0.0 11/20/2013 2041    Cardiac Enzymes: No results found for this basename: TROPIPOC,  in the last 72 hours Lab Results  Component Value Date   TROPONINI <0.30 11/20/2013  BNP: No results found for this basename: PROBNP,  in the last 72 hours  D-Dimer: No results found for this basename: DDIMER,  in the last 72 hours  CBG:  Recent Labs  11/20/13 2334 11/21/13 0005  GLUCAP 48* 108*    Hemoglobin A1C: No results found for this basename: HGBA1C,  in the last 72 hours  Lipid Panel: No results found for this basename: CHOL, HDL, LDLCALC, TRIG, CHOLHDL, LDLDIRECT,  in the last 72 hours  Thyroid Function Tests: No results found for this basename: TSH, T4TOTAL, FREET4, T3FREE, THYROIDAB,  in the last 72 hours  Anemia Panel: No results found for this basename: VITAMINB12, FOLATE, FERRITIN, TIBC, IRON, RETICCTPCT,  in the last 72 hours  Coagulation:  Recent Labs  11/20/13 2041  LABPROT 18.4*  INR 1.58*    Urine Drug Screen: Drugs of Abuse:  No results found for this basename: labopia,  cocainscrnur,  labbenz,  amphetmu,  thcu,  labbarb    Alcohol Level: No results found for this basename: ETH,  in the last 72 hours  Urinalysis: No results found for this basename: COLORURINE, APPERANCEUR, LABSPEC, PHURINE, GLUCOSEU, HGBUR, BILIRUBINUR, KETONESUR, PROTEINUR, UROBILINOGEN, NITRITE, LEUKOCYTESUR,  in the last 72 hours  Imaging results:  Dg Abd Acute W/chest  11/20/2013   CLINICAL DATA:  Abdominal pain  EXAM: ACUTE ABDOMEN SERIES (ABDOMEN 2 VIEW & CHEST 1 VIEW)  COMPARISON:  None.  FINDINGS: Cardiac shadow is enlarged.  The lungs are clear bilaterally.  Scattered large and small bowel gas is seen. The stomach is distended with air. No free air is noted. No  abnormal mass or abnormal calcifications are noted. No bony abnormality is seen.  IMPRESSION: No acute abnormality noted   Electronically Signed   By: Inez Catalina M.D.   On: 11/20/2013 20:07    EKG:  Date/Time:  Sunday November 20 2013 14:48:46 EST Ventricular Rate:  93 PR Interval:  140 QRS Duration: 102 QT Interval:  398 QTC Calculation: 494 R Axis:   -72 Text Interpretation:  Normal sinus rhythm Biatrial enlargement Pulmonary disease pattern Left anterior fascicular block Left ventricular hypertrophy ST \T\ Marked T wave abnormality, consider lateral ischemia Prolonged QT Abnormal ECG improved from prior Confirmed by ALLEN  MD, ANTHONY ([id number]) on 11/20/2013 7:13:26 PM    Antibiotics: Antibiotics Given (last 72 hours)   None      Anti-infectives   None      SIRS/Sepsis/Septic Shock criteria met: SIRS met   Consults: Nephrology  Assessment & Plan by Problem: Principal Problem:   Hyperkalemia, diminished renal excretion Active Problems:   COPD (chronic obstructive pulmonary disease)   ESRD on hemodialysis   Ascites   Substance abuse   PAF (paroxysmal atrial fibrillation)   HTN (hypertension)   PTSD (post-traumatic stress disorder)   Pt is a 58 y.o. male who  has a past medical history of COPD (chronic obstructive pulmonary disease); Hypertension; Hep C w/ coma, chronic; Irregular heartbeat; ESRD (end stage renal disease) on dialysis; Smoker unmotivated to quit; and Active smoker. He presents to the ED with abdominal pain, N/V.     Hyperkalemia- Pt K+7.7.  Likely secondary to ESRD.  Pt set up with [facility] on TTS during previous admission.  He did not attend HD on Thursday and only completed 2 hours of HD yesterday due to feeling "sick."  Pt given calcium, insulin, and glucose in the ED.   -consulted nephrology for emergent dialysis  -monitor on telemetry -repeat BMP NOW -treatment  for hyperkalemia if still elevated  Elevated anion-gap metabolic acidosis-  AG 44.  HCO3: 16.  Most likely due to uremia.   -sodium bicarb 52mq   End Stage Renal Disease on HD - Pt with K+7.7. -consult nephrology for emergent dialysis -Monitor wt and I & Os  -Continue rena-vit   Nausea, vomiting and diarrhea: Likely due to uremia secondary to noncompliance to dialysis.  Pt with similar complaints on previous admission -will treat symptomatically with Zofran  -HD for Azotemia  Secondary Hyperparathyroidism - pt with elevated PTH -Continue phoslo,hectorol, vitamin D   Hypoalbuminemia - Pt with chronic low albumin levels most likely due to cirrhosis and CKD. -Consider nutrition consult   Atrial Fibrillation - currently NSR -Consider checking TSH -Continue metoprolol 50 mg BID  -Continue 81 mg aspirin daily   Uncontrolled Hypertension -  -Continue metoprolol 50 mg BID   Chronic liver cirrhosis with ascites - Most likely due to chronic HCV.  Diagnostic and therapeutic (1.3L) paracentesis performed on 1/3 without suggestion of SBP.  -Continue to monitor for SBP   Microcytic anemia -stable H/H without active bleeding or hemodynamic instability.  -Consider colonoscopy -Continue to monitor for bleeding  -Continue ESA and darbepotein 25 weekly   Thrombocytopenia - stable with no active bleeding or bruising. Pt with plt 108,000. Pt presented with plt 113,000 on previous admission with unknown baseline. Most likely due to cirrhosis.   -Continue to monitor for bleeding   COPD - stable; no wheezing on lung exam with decreased air movement.  -Albuterol nebulizer PRN  -Ipatropium nebulizer BID   Substance Abuse - Pt self reported h/o cocaine use.  Also current cigarette smoker (1 pack 50 yrs).  -UDS -Encourage drug cessation   Insomnia-- -Continue home prazosin 4 mg at night for nightmares  -Continue home trazodone 25 mg at night  #FEN- NS-N/A  Electrolytes-Replete as needed  Diet-Renal    #VTE prophylaxis- 5000 Units Heparin SQ tid   #Dispo-  Disposition is deferred at this time, awaiting improvement of current medical problems.  Anticipated discharge in approximately 1-2 day(s).    Emergency Contact: Contact Information   Name Relation Home Work [place] [phone]       The patient does not have a current PCP (No primary provider on file.) and [facility] after discharge.  Signed: JMichail Jewels MD PGY-1, Internal Medicine  11/21/2013, 1:01 AM
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
## 2013-11-21 NOTE — Progress Notes (Signed)
Patient refused an assessment this shift. Was notified by charge nurse, Bella Kennedy, that patient had decided to leave AMA. Patient signed the AMA form and MD was notified by charge nurse. Patient left with belongings.

## 2013-11-21 NOTE — ED Notes (Signed)
Pt currently eating [place] sandwich, apple juice, peanut butter and crackers.  Alert and oriented X 4.

## 2013-11-22 NOTE — Discharge Summary (Signed)
I saw Carl Benson on day of discharge and assisted in the discharge planning.  His home BP meds were not restarted as he was not requiring them in house and it was unclear if he had been taking them outpatient.

## 2013-11-23 ENCOUNTER — Emergency Department (HOSPITAL_COMMUNITY)
Admission: EM | Admit: 2013-11-23 | Discharge: 2013-11-23 | Disposition: A | Discharge status: Home / Self Care | Payer: Medicare Other | Attending: Emergency Medicine | Admitting: Emergency Medicine

## 2013-11-23 ENCOUNTER — Encounter (HOSPITAL_COMMUNITY): Payer: Self-pay | Admitting: Emergency Medicine

## 2013-11-23 DIAGNOSIS — S91309A Unspecified open wound, unspecified foot, initial encounter: Secondary | ICD-10-CM | POA: Insufficient documentation

## 2013-11-23 DIAGNOSIS — Z992 Dependence on renal dialysis: Secondary | ICD-10-CM | POA: Insufficient documentation

## 2013-11-23 DIAGNOSIS — Y929 Unspecified place or not applicable: Secondary | ICD-10-CM | POA: Insufficient documentation

## 2013-11-23 DIAGNOSIS — I120 Hypertensive chronic kidney disease with stage 5 chronic kidney disease or end stage renal disease: Secondary | ICD-10-CM | POA: Insufficient documentation

## 2013-11-23 DIAGNOSIS — Z8619 Personal history of other infectious and parasitic diseases: Secondary | ICD-10-CM | POA: Insufficient documentation

## 2013-11-23 DIAGNOSIS — F4310 Post-traumatic stress disorder, unspecified: Secondary | ICD-10-CM | POA: Insufficient documentation

## 2013-11-23 DIAGNOSIS — X58XXXA Exposure to other specified factors, initial encounter: Secondary | ICD-10-CM | POA: Insufficient documentation

## 2013-11-23 DIAGNOSIS — Z79899 Other long term (current) drug therapy: Secondary | ICD-10-CM | POA: Insufficient documentation

## 2013-11-23 DIAGNOSIS — J4489 Other specified chronic obstructive pulmonary disease: Secondary | ICD-10-CM | POA: Insufficient documentation

## 2013-11-23 DIAGNOSIS — N186 End stage renal disease: Secondary | ICD-10-CM | POA: Insufficient documentation

## 2013-11-23 DIAGNOSIS — J449 Chronic obstructive pulmonary disease, unspecified: Secondary | ICD-10-CM | POA: Insufficient documentation

## 2013-11-23 DIAGNOSIS — Z7982 Long term (current) use of aspirin: Secondary | ICD-10-CM | POA: Insufficient documentation

## 2013-11-23 DIAGNOSIS — Y939 Activity, unspecified: Secondary | ICD-10-CM | POA: Insufficient documentation

## 2013-11-23 DIAGNOSIS — F17200 Nicotine dependence, unspecified, uncomplicated: Secondary | ICD-10-CM | POA: Insufficient documentation

## 2013-11-23 MED ORDER — BACITRACIN 500 UNIT/GM EX OINT: 1.0000 "application " | TOPICAL_OINTMENT | Freq: Two times a day (BID) | CUTANEOUS | Status: DC

## 2013-11-23 NOTE — Discharge Instructions (Signed)
Keep your cut wounds clean and dry - use topical antibiotic cream - follow up with a podiatrist in 2-3 days  Please call your doctor for a followup appointment within 24-48 hours. When you talk to your doctor please let them know that you were seen in the emergency department and have them acquire all of your records so that they can discuss the findings with you and formulate a treatment plan to fully care for your new and ongoing problems.

## 2013-11-23 NOTE — ED Notes (Signed)
Ortho paged at this time 

## 2013-11-23 NOTE — ED Provider Notes (Signed)
CSN: [id number]     Arrival date & time 11/23/13  0130 History   First MD Initiated Contact with Patient 11/23/13 [phone]     Chief Complaint  Patient presents with  . Foot Pain   (Consider location/radiation/quality/duration/timing/severity/associated sxs/prior Treatment) HPI Comments: Pt is a 58 y/o male with hx of ESRD on dialysis - presents with one week of foot pain - the skin on his R foot has become thin and is cracking in several locations making it hurt to walk and touch - no swelling (outside of normal), no redness and no fevers.  Gradually worsening - states had a recent pedicure one week ago.  Patient is a 58 y.o. male presenting with lower extremity pain. The history is provided by the patient.  Foot Pain    Past Medical History  Diagnosis Date  . COPD (chronic obstructive pulmonary disease)   . Hypertension   . Hep C w/ coma, chronic   . Irregular heartbeat   . ESRD (end stage renal disease) on dialysis     /notes 11/11/2013  . Smoker unmotivated to quit   . Active smoker   . PTSD (post-traumatic stress disorder)    Past Surgical History  Procedure Laterality Date  . Paracentesis  ~ 10/2013    [name] 11/11/2013   No family history on file. History  Substance Use Topics  . Smoking status: Current Every Day Smoker -- 1.50 packs/day for 15 years    Types: Cigarettes  . Smokeless tobacco: Not on file  . Alcohol Use: No    Review of Systems  Constitutional: Negative for fever.  Skin: Positive for wound.    Allergies  Review of patient's allergies indicates no known allergies.  Home Medications   Current Outpatient Rx  Name  Route  Sig  Dispense  Refill  . albuterol (PROVENTIL HFA;VENTOLIN HFA) 108 (90 BASE) MCG/ACT inhaler   Inhalation   Inhale 1-2 puffs into the lungs every 6 (six) hours as needed for wheezing or shortness of breath.   1 Inhaler   0   . aspirin 81 MG EC tablet   Oral   Take 1 tablet (81 mg total) by mouth daily.   30 tablet   3   .  b complex-vitamin c-folic acid (NEPHRO-VITE) 0.8 MG TABS tablet   Oral   Take 1 tablet by mouth at bedtime.   30 tablet   3   . calcium acetate (PHOSLO) 667 MG capsule   Oral   Take 2 capsules (1,334 mg total) by mouth 3 (three) times daily with meals.   90 capsule   3   . docusate sodium (COLACE) 100 MG capsule   Oral   Take 1 capsule (100 mg total) by mouth daily.   30 capsule   3   . folic acid (FOLVITE) 1 MG tablet   Oral   Take 1 tablet (1 mg total) by mouth daily.   30 tablet   3   . latanoprost (XALATAN) 0.005 % ophthalmic solution   Both Eyes   Place 1 drop into both eyes at bedtime. Affected eye   2.5 mL   12   . metoprolol (LOPRESSOR) 50 MG tablet   Oral   Take 1 tablet (50 mg total) by mouth 2 (two) times daily.   60 tablet   1   . prazosin (MINIPRESS) 2 MG capsule   Oral   Take 2 capsules (4 mg total) by mouth at bedtime.   30 capsule  3   . traZODone (DESYREL) 25 mg TABS tablet   Oral   Take 0.5 tablets (25 mg total) by mouth at bedtime.   30 tablet   1   . traZODone (DESYREL) 50 MG tablet   Oral   Take 25 mg by mouth at bedtime.          BP 145/86  Pulse 80  Temp(Src) 97.8 F (36.6 C) (Oral)  Resp 17  SpO2 99% Physical Exam  Constitutional: He appears well-developed and well-nourished. No distress.  HENT:  Head: Normocephalic and atraumatic.  Eyes: Conjunctivae are normal. Pupils are equal, round, and reactive to light. Right eye exhibits no discharge. Left eye exhibits no discharge.  Cardiovascular:  Strong pulses at the feet bialterally  Musculoskeletal: He exhibits no edema and no tenderness.  Neurological: He is alert. Coordination normal.  Skin: Skin is warm and dry.  Deep cracking of the skin under the R heel and the R great toe.  No bleeding, no d/c, no surrounding redness / swelling or warmth.    ED Course  Procedures (including critical care time) Labs Review Labs Reviewed - No data to display Imaging Review No  results found.  EKG Interpretation   None       MDM   1. Wound, open, foot    Wound care, f/u with podiatry, no acute interventions needed.  Request  A post op boot for walking to prevent stretch on the skin.  Meds given in ED:  Medications  bacitracin ointment 1 application (not administered)    Discharge Medication List as of 11/23/2013  3:54 AM        Vida RollerBrian D Declynn Lopresti, MD 11/23/13 [id number]

## 2013-11-23 NOTE — ED Notes (Signed)
Pt to ED via [facility] with c/o foot pain.  Pt st's skin is cracking on his right foot

## 2013-11-26 MED ORDER — AMLODIPINE BESYLATE 10 MG PO TABS: 10.0000 mg | ORAL_TABLET | Freq: Every day | ORAL | Status: DC

## 2013-11-27 ENCOUNTER — Encounter (HOSPITAL_COMMUNITY): Payer: Self-pay | Admitting: Emergency Medicine

## 2013-11-27 ENCOUNTER — Emergency Department (HOSPITAL_COMMUNITY)
Admission: EM | Admit: 2013-11-27 | Discharge: 2013-11-27 | Disposition: A | Discharge status: Home / Self Care | Payer: Medicare Other | Attending: Emergency Medicine | Admitting: Emergency Medicine

## 2013-11-27 ENCOUNTER — Emergency Department (HOSPITAL_COMMUNITY): Payer: Medicare Other

## 2013-11-27 DIAGNOSIS — N186 End stage renal disease: Secondary | ICD-10-CM | POA: Insufficient documentation

## 2013-11-27 DIAGNOSIS — R0789 Other chest pain: Secondary | ICD-10-CM | POA: Insufficient documentation

## 2013-11-27 DIAGNOSIS — R109 Unspecified abdominal pain: Secondary | ICD-10-CM | POA: Insufficient documentation

## 2013-11-27 DIAGNOSIS — Z79899 Other long term (current) drug therapy: Secondary | ICD-10-CM | POA: Insufficient documentation

## 2013-11-27 DIAGNOSIS — R141 Gas pain: Secondary | ICD-10-CM | POA: Insufficient documentation

## 2013-11-27 DIAGNOSIS — R142 Eructation: Secondary | ICD-10-CM | POA: Insufficient documentation

## 2013-11-27 DIAGNOSIS — R143 Flatulence: Secondary | ICD-10-CM

## 2013-11-27 DIAGNOSIS — R011 Cardiac murmur, unspecified: Secondary | ICD-10-CM | POA: Insufficient documentation

## 2013-11-27 DIAGNOSIS — F4310 Post-traumatic stress disorder, unspecified: Secondary | ICD-10-CM | POA: Insufficient documentation

## 2013-11-27 DIAGNOSIS — Z7982 Long term (current) use of aspirin: Secondary | ICD-10-CM | POA: Insufficient documentation

## 2013-11-27 DIAGNOSIS — I120 Hypertensive chronic kidney disease with stage 5 chronic kidney disease or end stage renal disease: Secondary | ICD-10-CM | POA: Insufficient documentation

## 2013-11-27 DIAGNOSIS — F17200 Nicotine dependence, unspecified, uncomplicated: Secondary | ICD-10-CM | POA: Insufficient documentation

## 2013-11-27 DIAGNOSIS — Z8619 Personal history of other infectious and parasitic diseases: Secondary | ICD-10-CM | POA: Insufficient documentation

## 2013-11-27 DIAGNOSIS — M7989 Other specified soft tissue disorders: Secondary | ICD-10-CM | POA: Insufficient documentation

## 2013-11-27 DIAGNOSIS — J441 Chronic obstructive pulmonary disease with (acute) exacerbation: Secondary | ICD-10-CM | POA: Insufficient documentation

## 2013-11-27 DIAGNOSIS — R0600 Dyspnea, unspecified: Secondary | ICD-10-CM

## 2013-11-27 DIAGNOSIS — Z992 Dependence on renal dialysis: Secondary | ICD-10-CM | POA: Insufficient documentation

## 2013-11-27 LAB — CBC WITH DIFFERENTIAL/PLATELET
Basophils Absolute: 0 10*3/uL (ref 0.0–0.1)
Basophils Relative: 0 % (ref 0–1)
EOS ABS: 0.1 10*3/uL (ref 0.0–0.7)
Eosinophils Relative: 1 % (ref 0–5)
HCT: 30.8 % — ABNORMAL LOW (ref 39.0–52.0)
Hemoglobin: 10.6 g/dL — ABNORMAL LOW (ref 13.0–17.0)
Lymphocytes Relative: 25 % (ref 12–46)
Lymphs Abs: 2.2 10*3/uL (ref 0.7–4.0)
MCH: 26.6 pg (ref 26.0–34.0)
MCHC: 34.4 g/dL (ref 30.0–36.0)
MCV: 77.2 fL — ABNORMAL LOW (ref 78.0–100.0)
Monocytes Absolute: 1.1 10*3/uL — ABNORMAL HIGH (ref 0.1–1.0)
Monocytes Relative: 12 % (ref 3–12)
NEUTROS PCT: 62 % (ref 43–77)
Neutro Abs: 5.5 10*3/uL (ref 1.7–7.7)
PLATELETS: 135 10*3/uL — AB (ref 150–400)
RBC: 3.99 MIL/uL — ABNORMAL LOW (ref 4.22–5.81)
RDW: 23.1 % — ABNORMAL HIGH (ref 11.5–15.5)
WBC: 8.9 10*3/uL (ref 4.0–10.5)

## 2013-11-27 LAB — COMPREHENSIVE METABOLIC PANEL
ALBUMIN: 2.8 g/dL — AB (ref 3.5–5.2)
ALK PHOS: 190 U/L — AB (ref 39–117)
ALT: 62 U/L — AB (ref 0–53)
AST: 123 U/L — ABNORMAL HIGH (ref 0–37)
BUN: 29 mg/dL — AB (ref 6–23)
CO2: 25 mEq/L (ref 19–32)
Calcium: 9.1 mg/dL (ref 8.4–10.5)
Chloride: 89 mEq/L — ABNORMAL LOW (ref 96–112)
Creatinine, Ser: 4.79 mg/dL — ABNORMAL HIGH (ref 0.50–1.35)
GFR calc Af Amer: 14 mL/min — ABNORMAL LOW (ref >90)
GFR calc non Af Amer: 12 mL/min — ABNORMAL LOW (ref >90)
Glucose, Bld: 111 mg/dL — ABNORMAL HIGH (ref 70–99)
Potassium: 5.3 mEq/L (ref 3.7–5.3)
Sodium: 134 mEq/L — ABNORMAL LOW (ref 137–147)
TOTAL PROTEIN: 7.7 g/dL (ref 6.0–8.3)
Total Bilirubin: 2.3 mg/dL — ABNORMAL HIGH (ref 0.3–1.2)

## 2013-11-27 LAB — INFLUENZA PANEL BY PCR (TYPE A & B)
H1N1 flu by pcr: NOT DETECTED
INFLAPCR: NEGATIVE
Influenza B By PCR: NEGATIVE

## 2013-11-27 LAB — TROPONIN I: Troponin I: <0.3 ng/mL (ref <0.30)

## 2013-11-27 LAB — PROTIME-INR
INR: 1.13 (ref 0.00–1.49)
Prothrombin Time: 14.3 seconds (ref 11.6–15.2)

## 2013-11-27 LAB — APTT: APTT: 30 s (ref 24–37)

## 2013-11-27 MED ORDER — HYDROXYZINE HCL 25 MG PO TABS: 25.0000 mg | ORAL_TABLET | Freq: Four times a day (QID) | ORAL | Status: DC

## 2013-11-27 MED ORDER — TRAZODONE 25 MG HALF TABLET: 12.5000 mg | ORAL_TABLET | Freq: Every day | ORAL | Status: DC

## 2013-11-27 MED ORDER — ALBUTEROL SULFATE HFA 108 (90 BASE) MCG/ACT IN AERS: 1.0000 | INHALATION_SPRAY | Freq: Four times a day (QID) | RESPIRATORY_TRACT | Status: DC | PRN

## 2013-11-27 MED ORDER — MORPHINE SULFATE 4 MG/ML IJ SOLN
4.0000 mg | Freq: Once | INTRAMUSCULAR | Status: AC
  Administered 2013-11-27: 4 mg via INTRAVENOUS
  Filled 2013-11-27: qty 1

## 2013-11-27 NOTE — Discharge Instructions (Signed)
Chronic Obstructive Pulmonary Disease  Chronic obstructive pulmonary disease (COPD) is a common lung condition in which airflow from the lungs is limited. COPD is a general term that can be used to describe many different lung problems that limit airflow, including both chronic bronchitis and emphysema.  If you have COPD, your lung function will probably never return to normal, but there are measures you can take to improve lung function and make yourself feel better.   CAUSES   · Smoking (common).    · Exposure to secondhand smoke.    · Genetic problems.  · Chronic inflammatory lung diseases or recurrent infections.  SYMPTOMS   · Shortness of breath, especially with physical activity.    · Deep, persistent (chronic) cough with a large amount of thick mucus.    · Wheezing.    · Rapid breaths (tachypnea).    · Fouse or bluish discoloration (cyanosis) of the skin, especially in fingers, toes, or lips.    · Fatigue.    · Weight loss.    · Frequent infections or episodes when breathing symptoms become much worse (exacerbations).    · Chest tightness.  DIAGNOSIS   Your healthcare provider will take a medical history and perform a physical examination to make the initial diagnosis.  Additional tests for COPD may include:   · Lung (pulmonary) function tests.  · Chest X-ray.  · CT scan.  · Blood tests.  TREATMENT   Treatment available to help you feel better when you have COPD include:   · Inhaler and nebulizer medicines. These help manage the symptoms of COPD and make your breathing more comfortable  · Supplemental oxygen. Supplemental oxygen is only helpful if you have a low oxygen level in your blood.    · Exercise and physical activity. These are beneficial for nearly all people with COPD. Some people may also benefit from a pulmonary rehabilitation program.  HOME CARE INSTRUCTIONS   · Take all medicines (inhaled or pills) as directed by your health care provider.  · Only take over-the-counter or prescription medicines  for pain, fever, or discomfort as directed by your health care provider.    · Avoid over-the-counter medicines or cough syrups that dry up your airway (such as antihistamines) and slow down the elimination of secretions unless instructed otherwise by your healthcare provider.    · If you are a smoker, the most important thing that you can do is stop smoking. Continuing to smoke will cause further lung damage and breathing trouble. Ask your health care provider for help with quitting smoking. He or she can direct you to community resources or hospitals that provide support.  · Avoid exposure to irritants such as smoke, chemicals, and fumes that aggravate your breathing.  · Use oxygen therapy and pulmonary rehabilitation if directed by your health care provider. If you require home oxygen therapy, ask your healthcare provider whether you should purchase a pulse oximeter to measure your oxygen level at home.    · Avoid contact with individuals who have a contagious illness.  · Avoid extreme temperature and humidity changes.  · Eat healthy foods. Eating smaller, more frequent meals and resting before meals may help you maintain your strength.  · Stay active, but balance activity with periods of rest. Exercise and physical activity will help you maintain your ability to do things you want to do.  · Preventing infection and hospitalization is very important when you have COPD. Make sure to receive all the vaccines your health care provider recommends, especially the pneumococcal and influenza vaccines. Ask your healthcare provider whether you   need a pneumonia vaccine.  · Learn and use relaxation techniques to manage stress.  · Learn and use controlled breathing techniques as directed by your health care provider. Controlled breathing techniques include:    · Pursed lip breathing. Start by breathing in (inhaling) through your nose for 1 second. Then, purse your lips as if you were going to whistle and breathe out (exhale)  through the pursed lips for 2 seconds.    · Diaphragmatic breathing. Start by putting one hand on your abdomen just above your waist. Inhale slowly through your nose. The hand on your abdomen should move out. Then purse your lips and exhale slowly. You should be able to feel the hand on your abdomen moving in as you exhale.    · Learn and use controlled coughing to clear mucus from your lungs. Controlled coughing is a series of short, progressive coughs. The steps of controlled coughing are:    1. Lean your head slightly forward.    2. Breathe in deeply using diaphragmatic breathing.    3. Try to hold your breath for 3 seconds.    4. Keep your mouth slightly open while coughing twice.    5. Spit any mucus out into a tissue.    6. Rest and repeat the steps once or twice as needed.  SEEK MEDICAL CARE IF:   · You are coughing up more mucus than usual.    · There is a change in the color or thickness of your mucus.    · Your breathing is more labored than usual.    · Your breathing is faster than usual.    SEEK IMMEDIATE MEDICAL CARE IF:   · You have shortness of breath while you are resting.    · You have shortness of breath that prevents you from:  · Being able to talk.    · Performing your usual physical activities.    · You have chest pain lasting longer than 5 minutes.    · Your skin color is more cyanotic than usual.  · You measure low oxygen saturations for longer than 5 minutes with a pulse oximeter.  MAKE SURE YOU:   · Understand these instructions.  · Will watch your condition.  · Will get help right away if you are not doing well or get worse.  Document Released: 08/06/2005 Document Revised: 08/17/2013 Document Reviewed: 06/23/2013  Exit[employer]® Patient Information ©2014 [employer].

## 2013-11-27 NOTE — ED Provider Notes (Addendum)
CSN: [id number]     Arrival date & time 11/27/13  0449 History   First MD Initiated Contact with Patient 11/27/13 [phone]     Chief Complaint  Patient presents with  . Shortness of Breath   (Consider location/radiation/quality/duration/timing/severity/associated sxs/prior Treatment) HPI Comments: 58 y/o man with PMH of ESRD last dialysis Saturday, HTN, HCV with cirrhosis and COPD comes in to the ED with cc of dib. Pt states that he has been having some wheezing at home, and dib was getting worse, so he called EMS. He also states that his abd is distended, and he thinks he needs paracentesis. Pt's legs are also swelling. He has hx cocaine use -last use was 4 years ago. Denies any CAD - he has not been taking his meds.  The history is provided by the patient.    Past Medical History  Diagnosis Date  . COPD (chronic obstructive pulmonary disease)   . Hypertension   . Hep C w/ coma, chronic   . Irregular heartbeat   . ESRD (end stage renal disease) on dialysis     /notes 11/11/2013  . Smoker unmotivated to quit   . Active smoker   . PTSD (post-traumatic stress disorder)    Past Surgical History  Procedure Laterality Date  . Paracentesis  ~ 10/2013    [name] 11/11/2013   No family history on file. History  Substance Use Topics  . Smoking status: Current Every Day Smoker -- 1.50 packs/day for 15 years    Types: Cigarettes  . Smokeless tobacco: Not on file  . Alcohol Use: No    Review of Systems  Constitutional: Negative for fever and activity change.  HENT: Negative for rhinorrhea and sinus pressure.   Eyes: Negative for visual disturbance.  Respiratory: Positive for chest tightness, shortness of breath and wheezing. Negative for cough.   Cardiovascular: Positive for leg swelling. Negative for chest pain.  Gastrointestinal: Positive for abdominal pain and abdominal distention. Negative for nausea and vomiting.  Musculoskeletal: Negative for arthralgias and neck pain.  Skin: Positive  for wound.  Neurological: Negative for dizziness, light-headedness and headaches.  Hematological: Does not bruise/bleed easily.  Psychiatric/Behavioral: Negative for confusion.    Allergies  Review of patient's allergies indicates no known allergies.  Home Medications   Current Outpatient Rx  Name  Route  Sig  Dispense  Refill  . albuterol (PROVENTIL HFA;VENTOLIN HFA) 108 (90 BASE) MCG/ACT inhaler   Inhalation   Inhale 1-2 puffs into the lungs every 6 (six) hours as needed for wheezing or shortness of breath.   1 Inhaler   0   . amLODipine (NORVASC) 10 MG tablet   Oral   Take 1 tablet (10 mg total) by mouth daily.   30 tablet   1   . aspirin 81 MG EC tablet   Oral   Take 1 tablet (81 mg total) by mouth daily.   30 tablet   3   . b complex-vitamin c-folic acid (NEPHRO-VITE) 0.8 MG TABS tablet   Oral   Take 1 tablet by mouth at bedtime.   30 tablet   3   . calcium acetate (PHOSLO) 667 MG capsule   Oral   Take 2 capsules (1,334 mg total) by mouth 3 (three) times daily with meals.   90 capsule   3   . docusate sodium (COLACE) 100 MG capsule   Oral   Take 1 capsule (100 mg total) by mouth daily.   30 capsule   3   .  folic acid (FOLVITE) 1 MG tablet   Oral   Take 1 tablet (1 mg total) by mouth daily.   30 tablet   3   . latanoprost (XALATAN) 0.005 % ophthalmic solution   Both Eyes   Place 1 drop into both eyes at bedtime. Affected eye   2.5 mL   12   . metoprolol (LOPRESSOR) 50 MG tablet   Oral   Take 1 tablet (50 mg total) by mouth 2 (two) times daily.   60 tablet   1   . prazosin (MINIPRESS) 2 MG capsule   Oral   Take 2 capsules (4 mg total) by mouth at bedtime.   30 capsule   3   . traZODone (DESYREL) 25 mg TABS tablet   Oral   Take 0.5 tablets (25 mg total) by mouth at bedtime.   30 tablet   1   . traZODone (DESYREL) 50 MG tablet   Oral   Take 25 mg by mouth at bedtime.          There were no vitals taken for this visit. Physical  Exam  Nursing note and vitals reviewed. Constitutional: He is oriented to person, place, and time. He appears well-developed.  HENT:  Head: Normocephalic and atraumatic.  Eyes: Conjunctivae and EOM are normal. Pupils are equal, round, and reactive to light.  Neck: Normal range of motion. Neck supple. JVD present.  Cardiovascular: Normal rate and regular rhythm.   Murmur heard. Pulmonary/Chest: Effort normal.  Left sided rhonchus breath sounds  Abdominal: Soft. Bowel sounds are normal. He exhibits no distension. There is no tenderness. There is no rebound and no guarding.  Neurological: He is alert and oriented to person, place, and time.  Skin: Skin is warm.    ED Course  Procedures (including critical care time) Labs Review Labs Reviewed  CBC WITH DIFFERENTIAL  COMPREHENSIVE METABOLIC PANEL  APTT  TROPONIN I  PROTIME-INR   Imaging Review No results found.  EKG Interpretation   None       MDM  No diagnosis found.  Differential diagnosis includes: ACS syndrome CHF exacerbation Valvular disorder Myocarditis Pericarditis Pericardial effusion Pneumonia Pleural effusion Pulmonary edema PE Anemia Musculoskeletal pain Ascites  Pt comes in with cc of dib, leg swelling ,abd distention.  He has hx of COPD, ESRD, ESLD. DIB likely multfactorial - with both COPD, and Ascites contributing. CXR ordered to r/o pleural effusion. ACS labs ordered as well. Flu panel will be sent, since he is having avute dib and some uri like sx.  Will continue with EMS nebs, and get CXR  Derwood KaplanAnkit Zyhir Cappella, MD 11/27/13 0514  7:28 AM All the results in the ED are normal. Xrays look good. There is no wheezing on my exam. Pt is not hypoxic anymore. HR is 90s. Discussed with the patient that there is no indication for emergent tap or dialysis - which is what he wanted. He is in the process of setting everything at the [facility] in [place] - which i hightly encouraged, Will give [facility] f/u as  well.   Derwood KaplanAnkit Satori Krabill, MD 11/27/13 [id number]

## 2013-11-27 NOTE — ED Notes (Signed)
Patient had dialysis on Friday. Patient is unable to take the whole 4 hour treatment, patient can only tolerate 2 hour because at the 2 hour mark he reports he has severe cramping. Patient is experiencing abdomen distension, bilateral leg swelling, wheezing, and SOB that started after dialysis yesterday. Patient is not taking meds like he should d/t just moving back here. Patient has hx of COPD, liver disease, hep C.

## 2013-11-27 NOTE — ED Notes (Signed)
Ambulated patient. Patient pulse ox 91-92% while ambulating.

## 2013-11-27 NOTE — ED Notes (Signed)
Report received, assumed care.  

## 2013-11-27 NOTE — Progress Notes (Signed)
Orthopedic Tech Progress Note Patient Details:  Carl Benson 1956-09-02 [id number]  Ortho Devices Type of Ortho Device: Postop shoe/boot   Haskell Flirt 11/27/2013, 7:50 AM

## 2013-11-27 NOTE — ED Notes (Signed)
Paged ortho for placement of post op boot.

## 2013-11-29 ENCOUNTER — Encounter (HOSPITAL_COMMUNITY): Payer: Self-pay | Admitting: Emergency Medicine

## 2013-11-29 ENCOUNTER — Emergency Department (HOSPITAL_COMMUNITY)
Admission: EM | Admit: 2013-11-29 | Discharge: 2013-11-29 | Disposition: A | Discharge status: Home / Self Care | Payer: Medicare Other | Attending: Emergency Medicine | Admitting: Emergency Medicine

## 2013-11-29 DIAGNOSIS — I120 Hypertensive chronic kidney disease with stage 5 chronic kidney disease or end stage renal disease: Secondary | ICD-10-CM | POA: Insufficient documentation

## 2013-11-29 DIAGNOSIS — R141 Gas pain: Secondary | ICD-10-CM | POA: Insufficient documentation

## 2013-11-29 DIAGNOSIS — F4310 Post-traumatic stress disorder, unspecified: Secondary | ICD-10-CM | POA: Insufficient documentation

## 2013-11-29 DIAGNOSIS — Z79899 Other long term (current) drug therapy: Secondary | ICD-10-CM | POA: Insufficient documentation

## 2013-11-29 DIAGNOSIS — J4489 Other specified chronic obstructive pulmonary disease: Secondary | ICD-10-CM | POA: Insufficient documentation

## 2013-11-29 DIAGNOSIS — Z8619 Personal history of other infectious and parasitic diseases: Secondary | ICD-10-CM | POA: Insufficient documentation

## 2013-11-29 DIAGNOSIS — J449 Chronic obstructive pulmonary disease, unspecified: Secondary | ICD-10-CM | POA: Insufficient documentation

## 2013-11-29 DIAGNOSIS — R143 Flatulence: Secondary | ICD-10-CM

## 2013-11-29 DIAGNOSIS — R188 Other ascites: Secondary | ICD-10-CM | POA: Insufficient documentation

## 2013-11-29 DIAGNOSIS — L299 Pruritus, unspecified: Secondary | ICD-10-CM

## 2013-11-29 DIAGNOSIS — Z7982 Long term (current) use of aspirin: Secondary | ICD-10-CM | POA: Insufficient documentation

## 2013-11-29 DIAGNOSIS — R142 Eructation: Secondary | ICD-10-CM | POA: Insufficient documentation

## 2013-11-29 DIAGNOSIS — B353 Tinea pedis: Secondary | ICD-10-CM | POA: Insufficient documentation

## 2013-11-29 DIAGNOSIS — N186 End stage renal disease: Secondary | ICD-10-CM | POA: Insufficient documentation

## 2013-11-29 DIAGNOSIS — F17200 Nicotine dependence, unspecified, uncomplicated: Secondary | ICD-10-CM | POA: Insufficient documentation

## 2013-11-29 DIAGNOSIS — M25579 Pain in unspecified ankle and joints of unspecified foot: Secondary | ICD-10-CM | POA: Insufficient documentation

## 2013-11-29 DIAGNOSIS — Z992 Dependence on renal dialysis: Secondary | ICD-10-CM | POA: Insufficient documentation

## 2013-11-29 MED ORDER — CLOTRIMAZOLE 1 % EX CREA
TOPICAL_CREAM | Freq: Two times a day (BID) | CUTANEOUS | Status: DC
  Administered 2013-11-29: 23:00:00 via TOPICAL
  Filled 2013-11-29: qty 15

## 2013-11-29 MED ORDER — HYDROXYZINE HCL 25 MG PO TABS
50.0000 mg | ORAL_TABLET | Freq: Once | ORAL | Status: AC
Start: 2013-11-29 — End: 2013-11-29
  Administered 2013-11-29: 50 mg via ORAL
  Filled 2013-11-29: qty 2

## 2013-11-29 NOTE — ED Notes (Signed)
Pt given food and drink per request. Sitting upright, alert, NAD, calm, interactive. Pt upset that he had to wait for his food and drink, wait plan and process explained with rationale.

## 2013-11-29 NOTE — ED Provider Notes (Signed)
CSN: [id number]     Arrival date & time 11/29/13  7829 History  This chart was scribed for non-physician practitioner Jaynie Crumble, PA-C working with Shelda Jakes, MD by Valera Castle, ED scribe. This patient was seen in room TR07C/TR07C and the patient's care was started at 10:01 PM.    Chief Complaint  Patient presents with  . Pruritis  . Foot Pain    The history is provided by the patient. No language interpreter was used.   HPI Comments: Carl Benson is a 58 y.o. male with h/o Hep C, ESRD, who presents to the Emergency Department complaining of constant, bilateral foot pain, stating that each time he has dialysis his feet get dry and crack. He reports associated severe itching over his body.  He states he usually takes Benadryl for his itchiness. He states he ran out of benadryl. States put vasiline to the feet. He denies any other associated symptoms.   PCP - No PCP Per Patient  Past Medical History  Diagnosis Date  . COPD (chronic obstructive pulmonary disease)   . Hypertension   . Hep C w/ coma, chronic   . Irregular heartbeat   . ESRD (end stage renal disease) on dialysis     /notes 11/11/2013  . Smoker unmotivated to quit   . Active smoker   . PTSD (post-traumatic stress disorder)    Past Surgical History  Procedure Laterality Date  . Paracentesis  ~ 10/2013    [name] 11/11/2013   No family history on file. History  Substance Use Topics  . Smoking status: Current Every Day Smoker -- 1.50 packs/day for 15 years    Types: Cigarettes  . Smokeless tobacco: Not on file  . Alcohol Use: No    Review of Systems  Constitutional: Negative for fever and chills.  Skin: Positive for color change and wound (bilateral feet).       + generalized itchiness  All other systems reviewed and are negative.    Allergies  Review of patient's allergies indicates no known allergies.  Home Medications   Current Outpatient Rx  Name  Route  Sig  Dispense  Refill  .  albuterol (PROVENTIL HFA;VENTOLIN HFA) 108 (90 BASE) MCG/ACT inhaler   Inhalation   Inhale 1-2 puffs into the lungs every 6 (six) hours as needed for wheezing or shortness of breath.         Marland Kitchen amLODipine (NORVASC) 10 MG tablet   Oral   Take 1 tablet (10 mg total) by mouth daily.   30 tablet   1   . aspirin 81 MG EC tablet   Oral   Take 1 tablet (81 mg total) by mouth daily.   30 tablet   3   . b complex-vitamin c-folic acid (NEPHRO-VITE) 0.8 MG TABS tablet   Oral   Take 1 tablet by mouth at bedtime.   30 tablet   3   . calcium acetate (PHOSLO) 667 MG capsule   Oral   Take 2 capsules (1,334 mg total) by mouth 3 (three) times daily with meals.   90 capsule   3   . calcium carbonate (TUMS EX) 750 MG chewable tablet   Oral   Chew 2 tablets by mouth daily as needed for heartburn.         . docusate sodium (COLACE) 100 MG capsule   Oral   Take 1 capsule (100 mg total) by mouth daily.   30 capsule   3   .  folic acid (FOLVITE) 1 MG tablet   Oral   Take 1 tablet (1 mg total) by mouth daily.   30 tablet   3   . hydrOXYzine (ATARAX/VISTARIL) 25 MG tablet   Oral   Take 1 tablet (25 mg total) by mouth every 6 (six) hours.   30 tablet   0   . latanoprost (XALATAN) 0.005 % ophthalmic solution   Both Eyes   Place 1 drop into both eyes at bedtime. Affected eye   2.5 mL   12   . metoprolol (LOPRESSOR) 50 MG tablet   Oral   Take 1 tablet (50 mg total) by mouth 2 (two) times daily.   60 tablet   1   . prazosin (MINIPRESS) 2 MG capsule   Oral   Take 2 capsules (4 mg total) by mouth at bedtime.   30 capsule   3   . traZODone (DESYREL) 25 mg TABS tablet   Oral   Take 0.5 tablets (25 mg total) by mouth at bedtime.   30 tablet   1    BP 149/90  Pulse 104  Temp(Src) 97.5 F (36.4 C) (Oral)  Resp 18  Wt 138 lb 14.2 oz (63 kg)  SpO2 100%  Physical Exam  Nursing note and vitals reviewed. Constitutional: He is oriented to person, place, and time. He appears  well-developed and well-nourished. No distress.  HENT:  Head: Normocephalic and atraumatic.  Eyes: EOM are normal.  Neck: Neck supple.  Cardiovascular: Normal rate.   Pulmonary/Chest: Effort normal. No respiratory distress.  Abdominal: Soft. He exhibits distension. There is no tenderness.  Ascites present  Musculoskeletal: Normal range of motion.  Erythema to bilateral soles of the feet, with scaling and peeling skin. No lesions or sores. No tenderness over bilateral feet  Neurological: He is alert and oriented to person, place, and time.  Skin: Skin is warm and dry. No rash noted.  Psychiatric: He has a normal mood and affect. His behavior is normal.    ED Course  Procedures (including critical care time)  DIAGNOSTIC STUDIES: Oxygen Saturation is 100% on room air, normal by my interpretation.    COORDINATION OF CARE: 10:06 PM-Discussed treatment plan which includes medication for his itchiness with pt at bedside and pt agreed to plan.    Labs Review Labs Reviewed - No data to display Imaging Review No results found.  EKG Interpretation   None      Medications - No data to display   MDM   1. Pruritus   2. Tinea pedis     Patient with generalized itching. He does have history of liver disease, suspect that is the cause of his itching. He does have history of the same. There is no rash over his body. He also is complaining of cracking to his feet. On exam I suspect he has tinea pedis.  Will treat with clotrimazole cream. Given him in ED. Also given vistaril 50mg  PO. Pt requested food and a sandwich. As soon as he got medicated and received sandwich he walk ed out of ED.   Filed Vitals:   11/29/13 1832 11/29/13 2057 11/29/13 2243  BP: 149/90 152/100 164/97  Pulse: 104 105 105  Temp: 97.5 F (36.4 C) 98.4 F (36.9 C) 97.3 F (36.3 C)  TempSrc: Oral Oral Oral  Resp: 18 20 14   Weight: 138 lb 14.2 oz (63 kg)    SpO2: 100% 99% 99%    I personally performed the  services described  in this documentation, which was scribed in my presence. The recorded information has been reviewed and is accurate.    Lottie Musselatyana A Tehani Mersman, PA-C 11/29/13 2319

## 2013-11-29 NOTE — ED Notes (Signed)
Pt, nor pt belongings, in room, unable to find pt.

## 2013-11-29 NOTE — ED Notes (Addendum)
Pt appears to have left after being treated, did not wait for d/c paperwork, unable to reassess or re- VS.

## 2013-11-29 NOTE — ED Notes (Signed)
After he got out of dialysis he started having severe itching and his lower feet are cracking open.  No respiratory issues.  Pt does not have medication.  Pt is waiting to get into [place].

## 2013-11-30 NOTE — Discharge Summary (Signed)
I saw Carl Benson on day of discharge, he left AMA in the early morning prior to continued work up being completed.

## 2013-12-02 ENCOUNTER — Emergency Department (HOSPITAL_COMMUNITY)
Admission: EM | Admit: 2013-12-02 | Discharge: 2013-12-02 | Disposition: A | Discharge status: Home / Self Care | Payer: Medicare Other | Attending: Emergency Medicine | Admitting: Emergency Medicine

## 2013-12-02 ENCOUNTER — Encounter (HOSPITAL_COMMUNITY): Payer: Self-pay | Admitting: Emergency Medicine

## 2013-12-02 DIAGNOSIS — I120 Hypertensive chronic kidney disease with stage 5 chronic kidney disease or end stage renal disease: Secondary | ICD-10-CM | POA: Insufficient documentation

## 2013-12-02 DIAGNOSIS — Z5900 Homelessness unspecified: Secondary | ICD-10-CM | POA: Insufficient documentation

## 2013-12-02 DIAGNOSIS — R0989 Other specified symptoms and signs involving the circulatory and respiratory systems: Principal | ICD-10-CM | POA: Insufficient documentation

## 2013-12-02 DIAGNOSIS — R143 Flatulence: Secondary | ICD-10-CM

## 2013-12-02 DIAGNOSIS — R0600 Dyspnea, unspecified: Secondary | ICD-10-CM

## 2013-12-02 DIAGNOSIS — F4310 Post-traumatic stress disorder, unspecified: Secondary | ICD-10-CM | POA: Insufficient documentation

## 2013-12-02 DIAGNOSIS — J4489 Other specified chronic obstructive pulmonary disease: Secondary | ICD-10-CM | POA: Insufficient documentation

## 2013-12-02 DIAGNOSIS — Z7982 Long term (current) use of aspirin: Secondary | ICD-10-CM | POA: Insufficient documentation

## 2013-12-02 DIAGNOSIS — R0609 Other forms of dyspnea: Secondary | ICD-10-CM | POA: Insufficient documentation

## 2013-12-02 DIAGNOSIS — R188 Other ascites: Secondary | ICD-10-CM | POA: Insufficient documentation

## 2013-12-02 DIAGNOSIS — F17200 Nicotine dependence, unspecified, uncomplicated: Secondary | ICD-10-CM | POA: Insufficient documentation

## 2013-12-02 DIAGNOSIS — Z992 Dependence on renal dialysis: Secondary | ICD-10-CM | POA: Insufficient documentation

## 2013-12-02 DIAGNOSIS — Z79899 Other long term (current) drug therapy: Secondary | ICD-10-CM | POA: Insufficient documentation

## 2013-12-02 DIAGNOSIS — R142 Eructation: Secondary | ICD-10-CM | POA: Insufficient documentation

## 2013-12-02 DIAGNOSIS — Z8719 Personal history of other diseases of the digestive system: Secondary | ICD-10-CM | POA: Insufficient documentation

## 2013-12-02 DIAGNOSIS — J449 Chronic obstructive pulmonary disease, unspecified: Secondary | ICD-10-CM | POA: Insufficient documentation

## 2013-12-02 DIAGNOSIS — N186 End stage renal disease: Secondary | ICD-10-CM | POA: Insufficient documentation

## 2013-12-02 DIAGNOSIS — R109 Unspecified abdominal pain: Secondary | ICD-10-CM | POA: Insufficient documentation

## 2013-12-02 DIAGNOSIS — L299 Pruritus, unspecified: Secondary | ICD-10-CM | POA: Insufficient documentation

## 2013-12-02 DIAGNOSIS — B1920 Unspecified viral hepatitis C without hepatic coma: Secondary | ICD-10-CM

## 2013-12-02 DIAGNOSIS — Z8619 Personal history of other infectious and parasitic diseases: Secondary | ICD-10-CM | POA: Insufficient documentation

## 2013-12-02 DIAGNOSIS — R141 Gas pain: Secondary | ICD-10-CM | POA: Insufficient documentation

## 2013-12-02 DIAGNOSIS — J441 Chronic obstructive pulmonary disease with (acute) exacerbation: Secondary | ICD-10-CM | POA: Insufficient documentation

## 2013-12-02 LAB — COMPREHENSIVE METABOLIC PANEL
ALBUMIN: 2.6 g/dL — AB (ref 3.5–5.2)
ALK PHOS: 196 U/L — AB (ref 39–117)
ALT: 66 U/L — ABNORMAL HIGH (ref 0–53)
AST: 132 U/L — ABNORMAL HIGH (ref 0–37)
BUN: 40 mg/dL — AB (ref 6–23)
CALCIUM: 9.1 mg/dL (ref 8.4–10.5)
CO2: 24 mEq/L (ref 19–32)
CREATININE: 6.46 mg/dL — AB (ref 0.50–1.35)
Chloride: 92 mEq/L — ABNORMAL LOW (ref 96–112)
GFR calc non Af Amer: 8 mL/min — ABNORMAL LOW (ref >90)
GFR, EST AFRICAN AMERICAN: 10 mL/min — AB (ref >90)
GLUCOSE: 126 mg/dL — AB (ref 70–99)
Potassium: 5.1 mEq/L (ref 3.7–5.3)
Sodium: 137 mEq/L (ref 137–147)
Total Bilirubin: 2 mg/dL — ABNORMAL HIGH (ref 0.3–1.2)
Total Protein: 7.4 g/dL (ref 6.0–8.3)

## 2013-12-02 LAB — CBC WITH DIFFERENTIAL/PLATELET
BASOS ABS: 0 10*3/uL (ref 0.0–0.1)
BASOS PCT: 0 % (ref 0–1)
Band Neutrophils: 0 % (ref 0–10)
Blasts: 0 %
EOS ABS: 0 10*3/uL (ref 0.0–0.7)
EOS PCT: 0 % (ref 0–5)
HCT: 31.7 % — ABNORMAL LOW (ref 39.0–52.0)
Hemoglobin: 11.1 g/dL — ABNORMAL LOW (ref 13.0–17.0)
LYMPHS ABS: 3 10*3/uL (ref 0.7–4.0)
Lymphocytes Relative: 38 % (ref 12–46)
MCH: 27 pg (ref 26.0–34.0)
MCHC: 35 g/dL (ref 30.0–36.0)
MCV: 77.1 fL — ABNORMAL LOW (ref 78.0–100.0)
MONO ABS: 0.8 10*3/uL (ref 0.1–1.0)
MONOS PCT: 10 % (ref 3–12)
Metamyelocytes Relative: 0 %
Myelocytes: 0 %
NEUTROS PCT: 52 % (ref 43–77)
NRBC: 1 /100{WBCs} — AB
Neutro Abs: 4.2 10*3/uL (ref 1.7–7.7)
PLATELETS: 149 10*3/uL — AB (ref 150–400)
Promyelocytes Absolute: 0 %
RBC: 4.11 MIL/uL — AB (ref 4.22–5.81)
RDW: 21.7 % — ABNORMAL HIGH (ref 11.5–15.5)
WBC: 8 10*3/uL (ref 4.0–10.5)

## 2013-12-02 LAB — LIPASE, BLOOD: LIPASE: 98 U/L — AB (ref 11–59)

## 2013-12-02 MED ORDER — DIPHENHYDRAMINE HCL 25 MG PO TABS: 25.0000 mg | ORAL_TABLET | Freq: Four times a day (QID) | ORAL | Status: DC

## 2013-12-02 MED ORDER — HYDROXYZINE HCL 25 MG PO TABS
25.0000 mg | ORAL_TABLET | Freq: Once | ORAL | Status: AC
  Administered 2013-12-02: 25 mg via ORAL
  Filled 2013-12-02: qty 1

## 2013-12-02 MED ORDER — DIPHENHYDRAMINE HCL 50 MG/ML IJ SOLN
50.0000 mg | Freq: Once | INTRAMUSCULAR | Status: AC
  Administered 2013-12-02: 50 mg via INTRAMUSCULAR
  Filled 2013-12-02: qty 1

## 2013-12-02 MED ORDER — TRAZODONE HCL 50 MG PO TABS
50.0000 mg | ORAL_TABLET | Freq: Every day | ORAL | Status: DC
  Administered 2013-12-02: 50 mg via ORAL
  Filled 2013-12-02 (×2): qty 1

## 2013-12-02 NOTE — ED Notes (Signed)
Pt to department via EMS- reports SOB, abd pain and constipation x4 days. Denies any vomiting or fever. Reports that he is a dialysis patient. Wheezing in upper lobes. Pt given 5mg  albuterol. BP-167/119 Hr-100 RR-16

## 2013-12-02 NOTE — Discharge Instructions (Signed)
Pruritus   Pruritis is an itch. There are many different problems that can cause an itch. Dry skin is one of the most common causes of itching. Most cases of itching do not require medical attention.   HOME CARE INSTRUCTIONS   Make sure your skin is moistened on a regular basis. A moisturizer that contains petroleum jelly is best for keeping moisture in your skin. If you develop a rash, you may try the following for relief:    Use corticosteroid cream.   Apply cool compresses to the affected areas.   Bathe with Epsom salts or baking soda in the bathwater.   Soak in colloidal oatmeal baths. These are available at your pharmacy.   Apply baking soda paste to the rash. Stir water into baking soda until it reaches a paste-like consistency.   Use an anti-itch lotion.   Take over-the-counter diphenhydramine medicine by mouth as the instructions direct.   Avoid scratching. Scratching may cause the rash to become infected. If itching is very bad, your caregiver may suggest prescription lotions or creams to lessen your symptoms.   Avoid hot showers, which can make itching worse. A cold shower may help with itching as long as you use a moisturizer after the shower.  SEEK MEDICAL CARE IF:  The itching does not go away after several days.  Document Released: 07/09/2011 Document Revised: 01/19/2012 Document Reviewed: 07/09/2011  [employer] Patient Information 2014 [employer].

## 2013-12-02 NOTE — ED Provider Notes (Signed)
CSN: [id number]     Arrival date & time 12/02/13  1530 History   First MD Initiated Contact with Patient 12/02/13 1738     Chief Complaint  Patient presents with  . Shortness of Breath  . Abdominal Pain   (Consider location/radiation/quality/duration/timing/severity/associated sxs/prior Treatment) HPI Comments: Patient is a 58 year old male with history of end-stage renal disease on hemodialysis as well as hepatitis C with chronic liver failure. He presents today with complaints of abdominal distention and shortness of breath. Feels as though he needs a paracentesis. He denies any abdominal pain, fever, productive cough.  Patient is a 58 y.o. male presenting with shortness of breath and abdominal pain. The history is provided by the patient.  Shortness of Breath Severity:  Moderate Onset quality:  Gradual Duration:  4 months Timing:  Constant Progression:  Worsening Chronicity:  New Context: activity   Relieved by:  Nothing Worsened by:  Nothing tried Ineffective treatments:  None tried Associated symptoms: abdominal pain   Abdominal Pain Associated symptoms: shortness of breath     Past Medical History  Diagnosis Date  . COPD (chronic obstructive pulmonary disease)   . Hypertension   . Hep C w/ coma, chronic   . Irregular heartbeat   . ESRD (end stage renal disease) on dialysis     /notes 11/11/2013  . Smoker unmotivated to quit   . Active smoker   . PTSD (post-traumatic stress disorder)    Past Surgical History  Procedure Laterality Date  . Paracentesis  ~ 10/2013    [name] 11/11/2013   History reviewed. No pertinent family history. History  Substance Use Topics  . Smoking status: Current Every Day Smoker -- 1.50 packs/day for 15 years    Types: Cigarettes  . Smokeless tobacco: Not on file  . Alcohol Use: No    Review of Systems  Respiratory: Positive for shortness of breath.   Gastrointestinal: Positive for abdominal pain.  All other systems reviewed and are  negative.    Allergies  Review of patient's allergies indicates no known allergies.  Home Medications   Current Outpatient Rx  Name  Route  Sig  Dispense  Refill  . albuterol (PROVENTIL HFA;VENTOLIN HFA) 108 (90 BASE) MCG/ACT inhaler   Inhalation   Inhale 1-2 puffs into the lungs every 6 (six) hours as needed for wheezing or shortness of breath.         Marland Kitchen amLODipine (NORVASC) 10 MG tablet   Oral   Take 1 tablet (10 mg total) by mouth daily.   30 tablet   1   . aspirin 81 MG EC tablet   Oral   Take 1 tablet (81 mg total) by mouth daily.   30 tablet   3   . b complex-vitamin c-folic acid (NEPHRO-VITE) 0.8 MG TABS tablet   Oral   Take 1 tablet by mouth at bedtime.   30 tablet   3   . calcium acetate (PHOSLO) 667 MG capsule   Oral   Take 2 capsules (1,334 mg total) by mouth 3 (three) times daily with meals.   90 capsule   3   . calcium carbonate (TUMS EX) 750 MG chewable tablet   Oral   Chew 2 tablets by mouth daily as needed for heartburn.         . diphenhydrAMINE (BENADRYL) 25 MG tablet   Oral   Take 1 tablet (25 mg total) by mouth every 6 (six) hours.   20 tablet   0   .  docusate sodium (COLACE) 100 MG capsule   Oral   Take 1 capsule (100 mg total) by mouth daily.   30 capsule   3   . folic acid (FOLVITE) 1 MG tablet   Oral   Take 1 tablet (1 mg total) by mouth daily.   30 tablet   3   . hydrOXYzine (ATARAX/VISTARIL) 25 MG tablet   Oral   Take 1 tablet (25 mg total) by mouth every 6 (six) hours.   30 tablet   0   . latanoprost (XALATAN) 0.005 % ophthalmic solution   Both Eyes   Place 1 drop into both eyes at bedtime. Affected eye   2.5 mL   12   . metoprolol (LOPRESSOR) 50 MG tablet   Oral   Take 1 tablet (50 mg total) by mouth 2 (two) times daily.   60 tablet   1   . prazosin (MINIPRESS) 2 MG capsule   Oral   Take 2 capsules (4 mg total) by mouth at bedtime.   30 capsule   3   . traZODone (DESYREL) 25 mg TABS tablet   Oral    Take 0.5 tablets (25 mg total) by mouth at bedtime.   30 tablet   1    BP 157/98  Pulse 100  Temp(Src) 97.9 F (36.6 C) (Oral)  Resp 18  Ht 5\' 5"  (1.651 m)  SpO2 96% Physical Exam  Nursing note and vitals reviewed. Constitutional: He is oriented to person, place, and time.   Patient is thin and cachectic, and appears chronically ill. He is in no acute distress.  HENT:  Head: Normocephalic and atraumatic.  Mouth/Throat: Oropharynx is clear and moist.  Neck: Normal range of motion. Neck supple.  Cardiovascular: Normal rate, regular rhythm and normal heart sounds.   No murmur heard. Pulmonary/Chest: Effort normal and breath sounds normal. No respiratory distress. He has no wheezes.  Abdominal:  The abdomen is distended with an ascitic fluid wave present.  Musculoskeletal: Normal range of motion. He exhibits no edema.  Lymphadenopathy:    He has no cervical adenopathy.  Neurological: He is alert and oriented to person, place, and time.  Skin: Skin is warm and dry.    ED Course  Procedures (including critical care time) Labs Review Labs Reviewed  CBC WITH DIFFERENTIAL - Abnormal; Notable for the following:    RBC 4.11 (*)    Hemoglobin 11.1 (*)    HCT 31.7 (*)    MCV 77.1 (*)    RDW 21.7 (*)    Platelets 149 (*)    nRBC 1 (*)    All other components within normal limits  COMPREHENSIVE METABOLIC PANEL - Abnormal; Notable for the following:    Chloride 92 (*)    Glucose, Bld 126 (*)    BUN 40 (*)    Creatinine, Ser 6.46 (*)    Albumin 2.6 (*)    AST 132 (*)    ALT 66 (*)    Alkaline Phosphatase 196 (*)    Total Bilirubin 2.0 (*)    GFR calc non Af Amer 8 (*)    GFR calc Af Amer 10 (*)    All other components within normal limits  LIPASE, BLOOD - Abnormal; Notable for the following:    Lipase 98 (*)    All other components within normal limits   Imaging Review No results found.    MDM  No diagnosis found. Patient is a 58 year old male who recently  relocated here.  He has end-stage renal disease and liver failure and appears to be in need of a paracentesis. I've spoken with interventional radiology and have made arrangements for this to be performed on Monday. This does not appear to be a emergent procedure. There is no fever, abdominal pain, and he is in no respiratory distress. His oxygen saturations are adequate.  This patient is homeless and has multiple medical issues. He came 2 [facility] January 1 and has been here many times since then. He is living in some sort of homeless shelter and I suspect there may be some alternative motive for his coming here. He is repeatedly asking for pain medication and a meal.  He will be discharged to home. Interventional radiology will call to set up an ultrasound guided paracentesis.    Geoffery Lyons, MD 12/02/13 [phone]

## 2013-12-02 NOTE — Discharge Instructions (Signed)
Radiology will call you on Monday to arrange a paracentesis.   Ascites Ascites is a gathering of fluid in the belly (abdomen). This is most often caused by liver disease. It may also be caused by a number of other less common problems. It causes a ballooning out (distension) of the abdomen. CAUSES  Scarring of the liver (cirrhosis) is the most common cause of ascites. Other causes include:  Infection or inflammation in the abdomen.  Cancer in the abdomen.  Heart failure.  Certain forms of kidney failure (nephritic syndrome).  Inflammation of the pancreas.  Clots in the veins of the liver. SYMPTOMS  In the early stages of ascites, you may not have any symptoms. The main symptom of ascites is a sense of abdominal bloating. This is due to the presence of fluid. This may also cause an increase in abdominal or waist size. People with this condition can develop swelling in the legs, and men can develop a swollen scrotum. When there is a lot of fluid, it may be hard to breath. Stretching of the abdomen by fluid can be painful. DIAGNOSIS  Certain features of your medical history, such as a history of liver disease and of an enlarging abdomen, can suggest the presence of ascites. The diagnosis of ascites can be made on physical exam by your caregiver. An abdominal ultrasound examination can confirm that ascites is present, and estimate the amount of fluid. Once ascites is confirmed, it is important to determine its cause. Again, a history of one of the conditions listed in "CAUSES" provides a strong clue. A physical exam is important, and blood and X-ray tests may be needed. During a procedure called paracentesis, a sample of fluid is removed from the abdomen. This can determine certain key features about the fluid, such as whether or not infection or cancer is present. Your caregiver will determine if a paracentesis is necessary. They will describe the procedure to you. PREVENTION  Ascites is a  complication of other conditions. Therefore to prevent ascites, you must seek treatment for any significant health conditions you have. Once ascites is present, careful attention to fluid and salt intake may help prevent it from getting worse. If you have ascites, you should not drink alcohol. PROGNOSIS  The prognosis of ascites depends on the underlying disease. If the disease is reversible, such as with certain infections or with heart failure, then ascites may improve or disappear. When ascites is caused by cirrhosis, then it indicates that the liver disease has worsened, and further evaluation and treatment of the liver disease is needed. If your ascites is caused by cancer, then the success or failure of the cancer treatment will determine whether your ascites will improve or worsen. RISKS AND COMPLICATIONS  Ascites is likely to worsen if it is not properly diagnosed and treated. A large amount of ascites can cause pain and difficulty breathing. The main complication, besides worsening, is infection (called spontaneous bacterial peritonitis). This requires prompt treatment. TREATMENT  The treatment of ascites depends on its cause. When liver disease is your cause, medical management using water pills (diuretics) and decreasing salt intake is often effective. Ascites due to peritoneal inflammation or malignancy (cancer) alone does not respond to salt restriction and diuretics. Hospitalization is sometimes required. If the treatment of ascites cannot be managed with medications, a number of other treatments are available. Your caregivers will help you decide which will work best for you. Some of these are:  Removal of fluid from the abdomen (paracentesis).  Fluid from the abdomen is passed into a vein (peritoneovenous shunting).  Liver transplantation.  Transjugular intrahepatic portosystemic stent shunt. HOME CARE INSTRUCTIONS  It is important to monitor body weight and the intake and output of  fluids. Weigh yourself at the same time every day. Record your weights. Fluid restriction may be necessary. It is also important to know your salt intake. The more salt you take in, the more fluid you will retain. Ninety percent of people with ascites respond to this approach.  Follow any directions for medicines carefully.  Follow up with your caregiver, as directed.  Report any changes in your health, especially any new or worsening symptoms.  If your ascites is from liver disease, avoid alcohol and other substances toxic to the liver. SEEK MEDICAL CARE IF:   Your weight increases more than a few pounds in a few days.  Your abdominal or waist size increases.  You develop swelling in your legs.  You had swelling and it worsens. SEEK IMMEDIATE MEDICAL CARE IF:   You develop a fever.  You develop new abdominal pain.  You develop difficulty breathing.  You develop confusion.  You have bleeding from the mouth, stomach, or rectum. MAKE SURE YOU:   Understand these instructions.  Will watch your condition.  Will get help right away if you are not doing well or get worse. Document Released: 10/27/2005 Document Revised: 01/19/2012 Document Reviewed: 05/28/2007 [facility] HospitalExitCare Patient Information 2014 [place]ExitCare, [place]C.

## 2013-12-02 NOTE — ED Provider Notes (Signed)
CSN: [id number]     Arrival date & time 12/02/13  [id number] History   First MD Initiated Contact with Patient 12/02/13 [phone]     Chief Complaint  Patient presents with  . Vascular Access Problem   (Consider location/radiation/quality/duration/timing/severity/associated sxs/prior Treatment) HPI 58 year old male presents to emergency department via EMS with complaint of itching.  Patient reports he had dialysis yesterday.  He reports when he has too much fluid pulled off, he has severe itching.  Patient reports he is awaiting his Social Security check, so that he can refill his medications.  He denies any shortness of breath, chest pain, weakness, numbness, fever, chills.  He is requesting Benadryl for itching and then plans to go back to the shelter. Past Medical History  Diagnosis Date  . COPD (chronic obstructive pulmonary disease)   . Hypertension   . Hep C w/ coma, chronic   . Irregular heartbeat   . ESRD (end stage renal disease) on dialysis     /notes 11/11/2013  . Smoker unmotivated to quit   . Active smoker   . PTSD (post-traumatic stress disorder)    Past Surgical History  Procedure Laterality Date  . Paracentesis  ~ 10/2013    [name]/notes 11/11/2013   No family history on file. History  Substance Use Topics  . Smoking status: Current Every Day Smoker -- 1.50 packs/day for 15 years    Types: Cigarettes  . Smokeless tobacco: Not on file  . Alcohol Use: No    Review of Systems  See History of Present Illness; otherwise all other systems are reviewed and negative Allergies  Review of patient's allergies indicates no known allergies.  Home Medications   Current Outpatient Rx  Name  Route  Sig  Dispense  Refill  . albuterol (PROVENTIL HFA;VENTOLIN HFA) 108 (90 BASE) MCG/ACT inhaler   Inhalation   Inhale 1-2 puffs into the lungs every 6 (six) hours as needed for wheezing or shortness of breath.         Marland Kitchen. amLODipine (NORVASC) 10 MG tablet   Oral   Take 1 tablet (10 mg total) by  mouth daily.   30 tablet   1   . aspirin 81 MG EC tablet   Oral   Take 1 tablet (81 mg total) by mouth daily.   30 tablet   3   . b complex-vitamin c-folic acid (NEPHRO-VITE) 0.8 MG TABS tablet   Oral   Take 1 tablet by mouth at bedtime.   30 tablet   3   . calcium acetate (PHOSLO) 667 MG capsule   Oral   Take 2 capsules (1,334 mg total) by mouth 3 (three) times daily with meals.   90 capsule   3   . calcium carbonate (TUMS EX) 750 MG chewable tablet   Oral   Chew 2 tablets by mouth daily as needed for heartburn.         . docusate sodium (COLACE) 100 MG capsule   Oral   Take 1 capsule (100 mg total) by mouth daily.   30 capsule   3   . folic acid (FOLVITE) 1 MG tablet   Oral   Take 1 tablet (1 mg total) by mouth daily.   30 tablet   3   . hydrOXYzine (ATARAX/VISTARIL) 25 MG tablet   Oral   Take 1 tablet (25 mg total) by mouth every 6 (six) hours.   30 tablet   0   . latanoprost (XALATAN) 0.005 % ophthalmic solution  Both Eyes   Place 1 drop into both eyes at bedtime. Affected eye   2.5 mL   12   . metoprolol (LOPRESSOR) 50 MG tablet   Oral   Take 1 tablet (50 mg total) by mouth 2 (two) times daily.   60 tablet   1   . prazosin (MINIPRESS) 2 MG capsule   Oral   Take 2 capsules (4 mg total) by mouth at bedtime.   30 capsule   3   . traZODone (DESYREL) 25 mg TABS tablet   Oral   Take 0.5 tablets (25 mg total) by mouth at bedtime.   30 tablet   1    BP 153/102  Pulse 99  Temp(Src) 98.8 F (37.1 C) (Axillary)  Resp 19  SpO2 97% Physical Exam  Constitutional: He is oriented to person, place, and time. He appears well-developed and well-nourished.  HENT:  Head: Normocephalic and atraumatic.  Nose: Nose normal.  Mouth/Throat: Oropharynx is clear and moist.  Eyes: Conjunctivae and EOM are normal. Pupils are equal, round, and reactive to light.  Neck: Normal range of motion. Neck supple. No JVD present. No tracheal deviation present. No  thyromegaly present.  Cardiovascular: Normal rate, regular rhythm, normal heart sounds and intact distal pulses.  Exam reveals no gallop and no friction rub.   No murmur heard. Pulmonary/Chest: Effort normal and breath sounds normal. No stridor. No respiratory distress. He has no wheezes. He has no rales. He exhibits no tenderness.  Abdominal: Soft. Bowel sounds are normal. He exhibits distension (ascites noted). He exhibits no mass. There is no tenderness. There is no rebound and no guarding.  Musculoskeletal: Normal range of motion. He exhibits no edema and no tenderness.  Lymphadenopathy:    He has no cervical adenopathy.  Neurological: He is alert and oriented to person, place, and time. No cranial nerve deficit. He exhibits normal muscle tone. Coordination normal.  Skin: Skin is warm and dry. No rash noted. No erythema. No pallor.  Secondary excoriations from itching.  No underlying rash noted  Psychiatric: He has a normal mood and affect. His behavior is normal. Judgment and thought content normal.    ED Course  Procedures (including critical care time) Labs Review Labs Reviewed - No data to display Imaging Review No results found.  EKG Interpretation   None       MDM   1. Pruritus    58 year old male with itching after dialysis.  I suspect the itching may have some relation to his underlying liver problems as well.  Will give Benadryl here.  Patient given resource information.  He reports that he has all resources he needs, he is only waiting for his Social Security check, which was sent to the wrong location, and that is expected within the next day or so    Olivia Mackie, MD 12/02/13 [phone]

## 2013-12-02 NOTE — ED Notes (Signed)
Per EMS: Pt coming from home. Pt reports receiving reports dialysis yesterday. Pt think they may have pulled to much fluid off during dialysis. Ax4, NAD.

## 2013-12-04 ENCOUNTER — Encounter (HOSPITAL_COMMUNITY): Payer: Self-pay | Admitting: Emergency Medicine

## 2013-12-04 ENCOUNTER — Emergency Department (HOSPITAL_COMMUNITY)
Admission: EM | Admit: 2013-12-04 | Discharge: 2013-12-04 | Disposition: A | Discharge status: Home / Self Care | Payer: Medicare Other | Attending: Emergency Medicine | Admitting: Emergency Medicine

## 2013-12-04 DIAGNOSIS — F4310 Post-traumatic stress disorder, unspecified: Secondary | ICD-10-CM | POA: Insufficient documentation

## 2013-12-04 DIAGNOSIS — Z8619 Personal history of other infectious and parasitic diseases: Secondary | ICD-10-CM | POA: Insufficient documentation

## 2013-12-04 DIAGNOSIS — F17200 Nicotine dependence, unspecified, uncomplicated: Secondary | ICD-10-CM | POA: Insufficient documentation

## 2013-12-04 DIAGNOSIS — Z992 Dependence on renal dialysis: Secondary | ICD-10-CM | POA: Insufficient documentation

## 2013-12-04 DIAGNOSIS — R609 Edema, unspecified: Secondary | ICD-10-CM | POA: Insufficient documentation

## 2013-12-04 DIAGNOSIS — Z7982 Long term (current) use of aspirin: Secondary | ICD-10-CM | POA: Insufficient documentation

## 2013-12-04 DIAGNOSIS — J4489 Other specified chronic obstructive pulmonary disease: Secondary | ICD-10-CM | POA: Insufficient documentation

## 2013-12-04 DIAGNOSIS — Z79899 Other long term (current) drug therapy: Secondary | ICD-10-CM | POA: Insufficient documentation

## 2013-12-04 DIAGNOSIS — R188 Other ascites: Secondary | ICD-10-CM | POA: Insufficient documentation

## 2013-12-04 DIAGNOSIS — J449 Chronic obstructive pulmonary disease, unspecified: Secondary | ICD-10-CM | POA: Insufficient documentation

## 2013-12-04 DIAGNOSIS — I120 Hypertensive chronic kidney disease with stage 5 chronic kidney disease or end stage renal disease: Secondary | ICD-10-CM | POA: Insufficient documentation

## 2013-12-04 DIAGNOSIS — N186 End stage renal disease: Secondary | ICD-10-CM | POA: Insufficient documentation

## 2013-12-04 LAB — CBC WITH DIFFERENTIAL/PLATELET
BASOS ABS: 0 10*3/uL (ref 0.0–0.1)
Basophils Relative: 0 % (ref 0–1)
EOS ABS: 0.1 10*3/uL (ref 0.0–0.7)
Eosinophils Relative: 1 % (ref 0–5)
HCT: 32.2 % — ABNORMAL LOW (ref 39.0–52.0)
Hemoglobin: 11.1 g/dL — ABNORMAL LOW (ref 13.0–17.0)
Lymphocytes Relative: 25 % (ref 12–46)
Lymphs Abs: 2 10*3/uL (ref 0.7–4.0)
MCH: 26.8 pg (ref 26.0–34.0)
MCHC: 34.5 g/dL (ref 30.0–36.0)
MCV: 77.8 fL — ABNORMAL LOW (ref 78.0–100.0)
MONOS PCT: 15 % — AB (ref 3–12)
Monocytes Absolute: 1.2 10*3/uL — ABNORMAL HIGH (ref 0.1–1.0)
NEUTROS PCT: 59 % (ref 43–77)
Neutro Abs: 4.7 10*3/uL (ref 1.7–7.7)
Platelets: 172 10*3/uL (ref 150–400)
RBC: 4.14 MIL/uL — ABNORMAL LOW (ref 4.22–5.81)
RDW: 22.4 % — ABNORMAL HIGH (ref 11.5–15.5)
WBC: 8 10*3/uL (ref 4.0–10.5)

## 2013-12-04 LAB — COMPREHENSIVE METABOLIC PANEL
ALBUMIN: 2.7 g/dL — AB (ref 3.5–5.2)
ALK PHOS: 223 U/L — AB (ref 39–117)
ALT: 60 U/L — AB (ref 0–53)
AST: 102 U/L — ABNORMAL HIGH (ref 0–37)
BILIRUBIN TOTAL: 2.1 mg/dL — AB (ref 0.3–1.2)
BUN: 40 mg/dL — ABNORMAL HIGH (ref 6–23)
CHLORIDE: 90 meq/L — AB (ref 96–112)
CO2: 21 mEq/L (ref 19–32)
Calcium: 9.1 mg/dL (ref 8.4–10.5)
Creatinine, Ser: 6.11 mg/dL — ABNORMAL HIGH (ref 0.50–1.35)
GFR calc Af Amer: 10 mL/min — ABNORMAL LOW (ref >90)
GFR calc non Af Amer: 9 mL/min — ABNORMAL LOW (ref >90)
Glucose, Bld: 105 mg/dL — ABNORMAL HIGH (ref 70–99)
Potassium: 5.1 mEq/L (ref 3.7–5.3)
SODIUM: 134 meq/L — AB (ref 137–147)
TOTAL PROTEIN: 7.8 g/dL (ref 6.0–8.3)

## 2013-12-04 MED ORDER — DOCUSATE SODIUM 100 MG PO CAPS
100.0000 mg | ORAL_CAPSULE | Freq: Once | ORAL | Status: DC
  Filled 2013-12-04: qty 1

## 2013-12-04 NOTE — ED Notes (Signed)
Pt yelling into hallway that he needs to go now. He doesn't want to miss his bus.

## 2013-12-04 NOTE — ED Notes (Signed)
Pt agreed to wait for medicine.

## 2013-12-04 NOTE — ED Notes (Signed)
Per EMS - pt coming from homeless shelter. Was seen here this morning, had dialysis. Went home and took a shower, noticed swelling in abd so called EMS. Reports always has swelling in abd but more recently. BP 142/98 HR 98 RR 16. Pt reports he has had to have fluid removed often, last time was a month ago. This morning was not a full treatment of dialysis. Pt in nad, skin warm and dry, resp e/u.

## 2013-12-04 NOTE — ED Notes (Signed)
Pt given bus pass ?

## 2013-12-04 NOTE — ED Notes (Signed)
Pt reports he is ready to go home, needs to get back so he wont be late for dinner.

## 2013-12-04 NOTE — Discharge Instructions (Signed)
Ascites °Ascites is a gathering of fluid in the belly (abdomen). This is most often caused by liver disease. It may also be caused by a number of other less common problems. It causes a ballooning out (distension) of the abdomen. °CAUSES  °Scarring of the liver (cirrhosis) is the most common cause of ascites. Other causes include: °· Infection or inflammation in the abdomen. °· Cancer in the abdomen. °· Heart failure. °· Certain forms of kidney failure (nephritic syndrome). °· Inflammation of the pancreas. °· Clots in the veins of the liver. °SYMPTOMS  °In the early stages of ascites, you may not have any symptoms. The main symptom of ascites is a sense of abdominal bloating. This is due to the presence of fluid. This may also cause an increase in abdominal or waist size. People with this condition can develop swelling in the legs, and men can develop a swollen scrotum. When there is a lot of fluid, it may be hard to breath. Stretching of the abdomen by fluid can be painful. °DIAGNOSIS  °Certain features of your medical history, such as a history of liver disease and of an enlarging abdomen, can suggest the presence of ascites. The diagnosis of ascites can be made on physical exam by your caregiver. An abdominal ultrasound examination can confirm that ascites is present, and estimate the amount of fluid. °Once ascites is confirmed, it is important to determine its cause. Again, a history of one of the conditions listed in "CAUSES" provides a strong clue. A physical exam is important, and blood and X-ray tests may be needed. During a procedure called paracentesis, a sample of fluid is removed from the abdomen. This can determine certain key features about the fluid, such as whether or not infection or cancer is present. Your caregiver will determine if a paracentesis is necessary. They will describe the procedure to you. °PREVENTION  °Ascites is a complication of other conditions. Therefore to prevent ascites, you  must seek treatment for any significant health conditions you have. Once ascites is present, careful attention to fluid and salt intake may help prevent it from getting worse. If you have ascites, you should not drink alcohol. °PROGNOSIS  °The prognosis of ascites depends on the underlying disease. If the disease is reversible, such as with certain infections or with heart failure, then ascites may improve or disappear. When ascites is caused by cirrhosis, then it indicates that the liver disease has worsened, and further evaluation and treatment of the liver disease is needed. If your ascites is caused by cancer, then the success or failure of the cancer treatment will determine whether your ascites will improve or worsen. °RISKS AND COMPLICATIONS  °Ascites is likely to worsen if it is not properly diagnosed and treated. A large amount of ascites can cause pain and difficulty breathing. The main complication, besides worsening, is infection (called spontaneous bacterial peritonitis). This requires prompt treatment. °TREATMENT  °The treatment of ascites depends on its cause. When liver disease is your cause, medical management using water pills (diuretics) and decreasing salt intake is often effective. Ascites due to peritoneal inflammation or malignancy (cancer) alone does not respond to salt restriction and diuretics. Hospitalization is sometimes required. °If the treatment of ascites cannot be managed with medications, a number of other treatments are available. Your caregivers will help you decide which will work best for you. Some of these are: °· Removal of fluid from the abdomen (paracentesis). °· Fluid from the abdomen is passed into a vein (peritoneovenous shunting). °·   Liver transplantation. °· Transjugular intrahepatic portosystemic stent shunt. °HOME CARE INSTRUCTIONS  °It is important to monitor body weight and the intake and output of fluids. Weigh yourself at the same time every day. Record your  weights. Fluid restriction may be necessary. It is also important to know your salt intake. The more salt you take in, the more fluid you will retain. Ninety percent of people with ascites respond to this approach. °· Follow any directions for medicines carefully. °· Follow up with your caregiver, as directed. °· Report any changes in your health, especially any new or worsening symptoms. °· If your ascites is from liver disease, avoid alcohol and other substances toxic to the liver. °SEEK MEDICAL CARE IF:  °· Your weight increases more than a few pounds in a few days. °· Your abdominal or waist size increases. °· You develop swelling in your legs. °· You had swelling and it worsens. °SEEK IMMEDIATE MEDICAL CARE IF:  °· You develop a fever. °· You develop new abdominal pain. °· You develop difficulty breathing. °· You develop confusion. °· You have bleeding from the mouth, stomach, or rectum. °MAKE SURE YOU:  °· Understand these instructions. °· Will watch your condition. °· Will get help right away if you are not doing well or get worse. °Document Released: 10/27/2005 Document Revised: 01/19/2012 Document Reviewed: 05/28/2007 °ExitCare® Patient Information ©2014 [employer]. ° °

## 2013-12-04 NOTE — ED Provider Notes (Signed)
CSN: [id number]     Arrival date & time 12/04/13  1711 History   First MD Initiated Contact with Patient 12/04/13 1722     Chief Complaint  Patient presents with  . Ascites   (Consider location/radiation/quality/duration/timing/severity/associated sxs/prior Treatment) HPI Comments: Pt is a 58 y.o. male with Pmhx including chronic hep C, ESRD on HD, who presents with complaint of increasing ascites. Denies CP, SOB, ab pain, vom.  He has had nausea and constipation, but had BM yesterday.   Pt had HD early this morning but did not have full treatment time.  Symptoms improved by paracentesis in the past, worsened by time.   No fevers, no vomiting, no diarrhea, no CP, no SOB, no cough.  No abdominal pain.  Hx of multiple paracentesis in past.    The history is provided by the patient. No language interpreter was used.    Past Medical History  Diagnosis Date  . COPD (chronic obstructive pulmonary disease)   . Hypertension   . Hep C w/ coma, chronic   . Irregular heartbeat   . ESRD (end stage renal disease) on dialysis     /notes 11/11/2013  . Smoker unmotivated to quit   . Active smoker   . PTSD (post-traumatic stress disorder)    Past Surgical History  Procedure Laterality Date  . Paracentesis  ~ 10/2013    [name] 11/11/2013   No family history on file. History  Substance Use Topics  . Smoking status: Current Every Day Smoker -- 1.50 packs/day for 15 years    Types: Cigarettes  . Smokeless tobacco: Not on file  . Alcohol Use: No    Review of Systems  Constitutional: Negative for fever, activity change, appetite change and fatigue.  HENT: Negative for congestion, facial swelling, rhinorrhea and trouble swallowing.   Eyes: Negative for photophobia and pain.  Respiratory: Negative for cough, chest tightness and shortness of breath.   Cardiovascular: Positive for leg swelling. Negative for chest pain.  Gastrointestinal: Positive for abdominal distention. Negative for nausea, vomiting,  abdominal pain, diarrhea and constipation.  Endocrine: Negative for polydipsia and polyuria.  Genitourinary: Negative for dysuria, urgency, decreased urine volume and difficulty urinating.  Musculoskeletal: Negative for back pain and gait problem.  Skin: Negative for color change, rash and wound.  Allergic/Immunologic: Negative for immunocompromised state.  Neurological: Negative for dizziness, facial asymmetry, speech difficulty, weakness, numbness and headaches.  Psychiatric/Behavioral: Negative for confusion, decreased concentration and agitation.    Allergies  Review of patient's allergies indicates no known allergies.  Home Medications   Current Outpatient Rx  Name  Route  Sig  Dispense  Refill  . albuterol (PROVENTIL HFA;VENTOLIN HFA) 108 (90 BASE) MCG/ACT inhaler   Inhalation   Inhale 1-2 puffs into the lungs every 6 (six) hours as needed for wheezing or shortness of breath.         Marland Kitchen amLODipine (NORVASC) 10 MG tablet   Oral   Take 1 tablet (10 mg total) by mouth daily.   30 tablet   1   . aspirin 81 MG EC tablet   Oral   Take 1 tablet (81 mg total) by mouth daily.   30 tablet   3   . b complex-vitamin c-folic acid (NEPHRO-VITE) 0.8 MG TABS tablet   Oral   Take 1 tablet by mouth at bedtime.   30 tablet   3   . calcium acetate (PHOSLO) 667 MG capsule   Oral   Take 2 capsules (1,334 mg total)  by mouth 3 (three) times daily with meals.   90 capsule   3   . calcium carbonate (TUMS EX) 750 MG chewable tablet   Oral   Chew 2 tablets by mouth daily as needed for heartburn.         . diphenhydrAMINE (BENADRYL) 25 MG tablet   Oral   Take 1 tablet (25 mg total) by mouth every 6 (six) hours.   20 tablet   0   . docusate sodium (COLACE) 100 MG capsule   Oral   Take 1 capsule (100 mg total) by mouth daily.   30 capsule   3   . folic acid (FOLVITE) 1 MG tablet   Oral   Take 1 tablet (1 mg total) by mouth daily.   30 tablet   3   . hydrOXYzine  (ATARAX/VISTARIL) 25 MG tablet   Oral   Take 1 tablet (25 mg total) by mouth every 6 (six) hours.   30 tablet   0   . latanoprost (XALATAN) 0.005 % ophthalmic solution   Both Eyes   Place 1 drop into both eyes at bedtime. Affected eye   2.5 mL   12   . metoprolol (LOPRESSOR) 50 MG tablet   Oral   Take 1 tablet (50 mg total) by mouth 2 (two) times daily.   60 tablet   1   . prazosin (MINIPRESS) 2 MG capsule   Oral   Take 2 capsules (4 mg total) by mouth at bedtime.   30 capsule   3   . traZODone (DESYREL) 25 mg TABS tablet   Oral   Take 0.5 tablets (25 mg total) by mouth at bedtime.   30 tablet   1    BP 167/115  Pulse 98  Temp(Src) 97.5 F (36.4 C) (Oral)  Resp 19  Ht 5\' 5"  (1.651 m)  Wt 138 lb (62.596 kg)  BMI 22.96 kg/m2  SpO2 100% Physical Exam  Constitutional: He is oriented to person, place, and time. He appears well-developed and well-nourished. No distress.  HENT:  Head: Normocephalic and atraumatic.  Mouth/Throat: No oropharyngeal exudate.  Eyes: Pupils are equal, round, and reactive to light.  Neck: Normal range of motion. Neck supple.  Cardiovascular: Normal rate, regular rhythm and normal heart sounds.  Exam reveals no gallop and no friction rub.   No murmur heard. Pulmonary/Chest: Effort normal and breath sounds normal. No respiratory distress. He has no wheezes. He has no rales.  Abdominal: Soft. Bowel sounds are normal. He exhibits distension, fluid wave and ascites. He exhibits no mass. There is no tenderness. There is no rigidity, no rebound and no guarding.  Musculoskeletal: Normal range of motion. He exhibits edema (2+ BLLE edema). He exhibits no tenderness.  Neurological: He is alert and oriented to person, place, and time.  Skin: Skin is warm and dry.  Psychiatric: He has a normal mood and affect.    ED Course  Procedures (including critical care time) Labs Review Labs Reviewed  CBC WITH DIFFERENTIAL - Abnormal; Notable for the  following:    RBC 4.14 (*)    Hemoglobin 11.1 (*)    HCT 32.2 (*)    MCV 77.8 (*)    RDW 22.4 (*)    Monocytes Relative 15 (*)    Monocytes Absolute 1.2 (*)    All other components within normal limits  COMPREHENSIVE METABOLIC PANEL - Abnormal; Notable for the following:    Sodium 134 (*)    Chloride 90 (*)  Glucose, Bld 105 (*)    BUN 40 (*)    Creatinine, Ser 6.11 (*)    Albumin 2.7 (*)    AST 102 (*)    ALT 60 (*)    Alkaline Phosphatase 223 (*)    Total Bilirubin 2.1 (*)    GFR calc non Af Amer 9 (*)    GFR calc Af Amer 10 (*)    All other components within normal limits   Imaging Review No results found.  EKG Interpretation   None       MDM   1. Ascites    Pt is a 58 y.o. male with Pmhx as above who presents with complaint of increasing ascites. Denies CP, SOB, ab pain, vom.  He has had nausea and constipation, but had BM yesterday.  Abdomen distended, but does not appear to have tenderness to palpation. VSS. Pt had HD early this morning but did not have full treatment time.  He states he noticed in the shower his abdomen was large and decided to come in. He was seen for similar complain on 1/23/'15 and Dr. Judd Lienelo arranged w/ IR for pt to be called for a paracentesis on Monday.  I do not feel he requires an emergent paracentesis.  No resp distress, no fever, no ab pain, fever or elevated WBC to consider SBP.  He can be d/c back to shelter with same plan in place. Return precautions given for new or worsening symptoms including ab pain, fever, SOB, chest pain.         Shanna CiscoMegan E Serria Sloma, MD 12/06/13 [id number]

## 2013-12-04 NOTE — ED Notes (Signed)
Pt reports he doesn't make urine anymore. Pt reports he hasn't taken any of his daily meds in a while because he doesn't have any money.

## 2013-12-04 NOTE — ED Notes (Signed)
Pharmacy tech at bedside 

## 2013-12-06 ENCOUNTER — Emergency Department (HOSPITAL_COMMUNITY)
Admission: EM | Admit: 2013-12-06 | Discharge: 2013-12-06 | Disposition: A | Discharge status: Home / Self Care | Payer: Medicare Other | Attending: Emergency Medicine | Admitting: Emergency Medicine

## 2013-12-06 ENCOUNTER — Encounter (HOSPITAL_COMMUNITY): Payer: Self-pay | Admitting: Emergency Medicine

## 2013-12-06 DIAGNOSIS — J4489 Other specified chronic obstructive pulmonary disease: Secondary | ICD-10-CM | POA: Insufficient documentation

## 2013-12-06 DIAGNOSIS — N186 End stage renal disease: Secondary | ICD-10-CM | POA: Insufficient documentation

## 2013-12-06 DIAGNOSIS — Z7982 Long term (current) use of aspirin: Secondary | ICD-10-CM | POA: Insufficient documentation

## 2013-12-06 DIAGNOSIS — Z79899 Other long term (current) drug therapy: Secondary | ICD-10-CM | POA: Insufficient documentation

## 2013-12-06 DIAGNOSIS — R188 Other ascites: Secondary | ICD-10-CM | POA: Insufficient documentation

## 2013-12-06 DIAGNOSIS — F4310 Post-traumatic stress disorder, unspecified: Secondary | ICD-10-CM | POA: Insufficient documentation

## 2013-12-06 DIAGNOSIS — Z992 Dependence on renal dialysis: Secondary | ICD-10-CM | POA: Insufficient documentation

## 2013-12-06 DIAGNOSIS — I120 Hypertensive chronic kidney disease with stage 5 chronic kidney disease or end stage renal disease: Secondary | ICD-10-CM | POA: Insufficient documentation

## 2013-12-06 DIAGNOSIS — F17200 Nicotine dependence, unspecified, uncomplicated: Secondary | ICD-10-CM | POA: Insufficient documentation

## 2013-12-06 DIAGNOSIS — J449 Chronic obstructive pulmonary disease, unspecified: Secondary | ICD-10-CM | POA: Insufficient documentation

## 2013-12-06 DIAGNOSIS — Z8619 Personal history of other infectious and parasitic diseases: Secondary | ICD-10-CM | POA: Insufficient documentation

## 2013-12-06 NOTE — ED Notes (Addendum)
To room via EMS.  Pt c/o penis, scrotum swelling.  EMS BP 140/90 HR 106 RR 14 SaO2 98% on room air.  Lungs clear.  No c/o pain.  Pt had dialysis today.  AV graft in right upper arm.

## 2013-12-06 NOTE — Discharge Instructions (Signed)
See a gastroenterologist, either in [place], or at the [place] for further care for your ascites. Followup with a primary care doctor for general care as soon as possible.    Ascites Ascites is a gathering of fluid in the belly (abdomen). This is most often caused by liver disease. It may also be caused by a number of other less common problems. It causes a ballooning out (distension) of the abdomen. CAUSES  Scarring of the liver (cirrhosis) is the most common cause of ascites. Other causes include:  Infection or inflammation in the abdomen.  Cancer in the abdomen.  Heart failure.  Certain forms of kidney failure (nephritic syndrome).  Inflammation of the pancreas.  Clots in the veins of the liver. SYMPTOMS  In the early stages of ascites, you may not have any symptoms. The main symptom of ascites is a sense of abdominal bloating. This is due to the presence of fluid. This may also cause an increase in abdominal or waist size. People with this condition can develop swelling in the legs, and men can develop a swollen scrotum. When there is a lot of fluid, it may be hard to breath. Stretching of the abdomen by fluid can be painful. DIAGNOSIS  Certain features of your medical history, such as a history of liver disease and of an enlarging abdomen, can suggest the presence of ascites. The diagnosis of ascites can be made on physical exam by your caregiver. An abdominal ultrasound examination can confirm that ascites is present, and estimate the amount of fluid. Once ascites is confirmed, it is important to determine its cause. Again, a history of one of the conditions listed in "CAUSES" provides a strong clue. A physical exam is important, and blood and X-ray tests may be needed. During a procedure called paracentesis, a sample of fluid is removed from the abdomen. This can determine certain key features about the fluid, such as whether or not infection or cancer is present. Your caregiver will  determine if a paracentesis is necessary. They will describe the procedure to you. PREVENTION  Ascites is a complication of other conditions. Therefore to prevent ascites, you must seek treatment for any significant health conditions you have. Once ascites is present, careful attention to fluid and salt intake may help prevent it from getting worse. If you have ascites, you should not drink alcohol. PROGNOSIS  The prognosis of ascites depends on the underlying disease. If the disease is reversible, such as with certain infections or with heart failure, then ascites may improve or disappear. When ascites is caused by cirrhosis, then it indicates that the liver disease has worsened, and further evaluation and treatment of the liver disease is needed. If your ascites is caused by cancer, then the success or failure of the cancer treatment will determine whether your ascites will improve or worsen. RISKS AND COMPLICATIONS  Ascites is likely to worsen if it is not properly diagnosed and treated. A large amount of ascites can cause pain and difficulty breathing. The main complication, besides worsening, is infection (called spontaneous bacterial peritonitis). This requires prompt treatment. TREATMENT  The treatment of ascites depends on its cause. When liver disease is your cause, medical management using water pills (diuretics) and decreasing salt intake is often effective. Ascites due to peritoneal inflammation or malignancy (cancer) alone does not respond to salt restriction and diuretics. Hospitalization is sometimes required. If the treatment of ascites cannot be managed with medications, a number of other treatments are available. Your caregivers will help  you decide which will work best for you. Some of these are:  Removal of fluid from the abdomen (paracentesis).  Fluid from the abdomen is passed into a vein (peritoneovenous shunting).  Liver transplantation.  Transjugular intrahepatic  portosystemic stent shunt. HOME CARE INSTRUCTIONS  It is important to monitor body weight and the intake and output of fluids. Weigh yourself at the same time every day. Record your weights. Fluid restriction may be necessary. It is also important to know your salt intake. The more salt you take in, the more fluid you will retain. Ninety percent of people with ascites respond to this approach.  Follow any directions for medicines carefully.  Follow up with your caregiver, as directed.  Report any changes in your health, especially any new or worsening symptoms.  If your ascites is from liver disease, avoid alcohol and other substances toxic to the liver. SEEK MEDICAL CARE IF:   Your weight increases more than a few pounds in a few days.  Your abdominal or waist size increases.  You develop swelling in your legs.  You had swelling and it worsens. SEEK IMMEDIATE MEDICAL CARE IF:   You develop a fever.  You develop new abdominal pain.  You develop difficulty breathing.  You develop confusion.  You have bleeding from the mouth, stomach, or rectum. MAKE SURE YOU:   Understand these instructions.  Will watch your condition.  Will get help right away if you are not doing well or get worse. Document Released: 10/27/2005 Document Revised: 01/19/2012 Document Reviewed: 05/28/2007 [facility] Patient Information 2014 [place], [place].  Emergency Department Resource Guide 1) Find a Doctor and Pay Out of Pocket Although you won't have to find out who is covered by your insurance plan, it is a good idea to ask around and get recommendations. You will then need to call the office and see if the doctor you have chosen will accept you as a new patient and what types of options they offer for patients who are self-pay. Some doctors offer discounts or will set up payment plans for their patients who do not have insurance, but you will need to ask so you aren't surprised when you get to your  appointment.  2) Contact Your Local Health Department Not all health departments have doctors that can see patients for sick visits, but many do, so it is worth a call to see if yours does. If you don't know where your local health department is, you can check in your phone book. The CDC also has a tool to help you locate your state's health department, and many state websites also have listings of all of their local health departments.  3) Find a Walk-in Clinic If your illness is not likely to be very severe or complicated, you may want to try a walk in clinic. These are popping up all over the country in pharmacies, drugstores, and shopping centers. They're usually staffed by nurse practitioners or physician assistants that have been trained to treat common illnesses and complaints. They're usually fairly quick and inexpensive. However, if you have serious medical issues or chronic medical problems, these are probably not your best option.  No Primary Care Doctor: - Call Health Connect at  [phone] - they can help you locate a primary care doctor that  accepts your insurance, provides certain services, etc. - Physician Referral Service- [phone]  Chronic Pain Problems: Organization         Address  Phone   Notes  [facility]  [phone] Patients need to be referred by their primary care doctor.   Medication Assistance: Organization         Address  Phone   Notes  [facility] Medication [facility] [address] [place] [place], [place] [address] [phone] --Must be a resident of [facility] -- Must have NO insurance coverage whatsoever (no Medicaid/ Medicare, etc.) -- The pt. MUST have a primary care doctor that directs their care regularly and follows them in the community   [facility]  [phone]   [employer]  [phone]    Agencies that provide inexpensive medical care: Organization         Address  Phone   Notes  [facility]  [phone]   [facility]    [phone]   [facility] [address] [place], [place] [address] [phone]   [facility] [address] [place], [place] [phone]   Planned Parenthood    [phone]   [facility]    [phone]   [facility]  [address], [place] Phone:  [phone], Fax:  [phone] Hours of Operation:  9 am - 6 pm, M-F.  Also accepts Medicaid/Medicare and self-pay.  [facility]  [address], [place] Phone: [phone], Fax: [phone]. Hours of Operation:  8:30 am - 5:30 pm, M-F.  Also accepts Medicaid and self-pay.  [facility] [place] [address], [place] Phone: [phone]   [facility] [address] Natasha Bence [place], [place] [phone], Ext. [phone] Mondays & Thursdays: 7-9 AM.  First 15 patients are seen on a first come, first serve basis.    Medicaid-accepting [facility] Providers:  Organization         Address  Phone   Notes  [facility] [address], [place] [phone] Also accepts self-pay patients.  [facility] [address] Laurell Josephs [place], [place]  [phone]   [facility] [address], [place] [phone]   [facility] [address] [place] [phone]   Renaye Rakers [address], [place]   [phone] Only accepts [place] Access [place] patients after they have their name applied to their card.   Self-Pay (no insurance) in [facility]:  Organization         Address  Phone   Notes  Sickle Cell Patients, [facility] Internal Medicine [address] [place], [place] [phone]   [facility] [address] [place], [place] [phone]   [facility]  [place]  [address], [place] [phone]   [facility]/Dr. Osei-Bonsu  [address], [place] or [address], [place] [phone] Phone number for both [place] and [place] locations is the same.  [facility] [address] [place] [phone]   [facility] [address] [place] or [address] [phone]   [facility] [address], [place] [phone], phone; [phone], fax Sees patients 1st and 3rd Saturday of every month.  Must not qualify for public or private insurance (i.e. Medicaid, Medicare, [place]  Health Choice, Veterans' Benefits)  Household income should be no more than 200% of the poverty level The clinic cannot treat you if you are pregnant or think you are pregnant  Sexually transmitted diseases are not treated at the clinic.    Dental Care: Organization         Address  Phone  Notes  [facility] [address], [place] [phone] Accepts children up to age 60 who are enrolled in [place] or [place] Health Choice; pregnant women with a Medicaid card; and children who have applied for Medicaid or [place] Health Choice, but were declined, whose parents can pay a reduced fee at time of service.  [facility] [place]  [address], [place] [phone] Accepts children up to age 60 who are enrolled in [place] or [place] Health Choice; pregnant women with a Medicaid card; and children who have applied for Medicaid or  Health Choice, but were declined, whose parents can pay a reduced fee at time of service.  [facility]  [address], [place] [phone] Patients are seen by appointment only. Walk-ins are not accepted. [facility] will see patients 60 years of age and  older. Monday - Tuesday (8am-5pm) Most Wednesdays (8:30-5pm) $30 per visit, cash only  [facility]  [address], [facility] [phone] Patients are seen by appointment only. Walk-ins are not accepted. [facility] will see patients 60 years of age and older. One Wednesday Evening (Monthly: Volunteer Based).  $30 per visit, cash only  [employer] [facility]  [phone] for adults; Children under age 614, call Graduate Pediatric Dentistry at [phone]. Children aged 724-14, please call [phone]) [phone] to request a pediatric application.  Dental services are provided in all areas of dental care including fillings, crowns and bridges, complete and partial dentures, implants, gum treatment, root canals, and extractions. Preventive care is also provided. Treatment is provided to both adults and children. Patients are selected via a lottery and there is often a waiting list.   [facility] [address], [place]  [phone] [email or web address]   [facility] [address], [place], [place] 818 128 [phone], Ext. [phone] Second and Fourth Thursday of each month, opens at 6:30 AM; Clinic ends at 9 AM.  Patients are seen on a first-come first-served basis, and a limited number are seen during each clinic.   [facility]  [address], [place], [place] [phone]   Eligibility Requirements You must have lived in [place], [place], or [place] counties for at least the last three months.   You cannot be eligible for state or federal sponsored [place]healthcare insurance, including CIGNAVeterans Administration, [place]icaid, or [employer]icare.   You generally cannot be eligible for healthcare insurance through your employer.    How to apply: Eligibility screenings are held every Tuesday and Wednesday afternoon from 1:00 pm until 4:00 pm. You do not need an appointment for the interview!  [facility] [address],  [place], [place] [phone]   [facility]  [phone]   [facility]  [phone]   [facility]  [phone]    Behavioral Health Resources in the Community: Intensive Outpatient Programs Organization         Address  Phone  Notes  [facility] [phone]  [address] [place], [place] [phone]   [facility] Outpatient [address], [place], [place] [phone]   [facility] [address] [place], [place]  [phone]   [facility] [address],  [place], [place] [phone] or [phone]   Substance Abuse Resources Organization         Address  Phone  Notes  [facility]  [phone]   [facility]  [phone]   [facility] [place]  [phone]   Floydene Flock  [phone]   Residential & Outpatient Substance Abuse Program  [phone]   Psychological Services Organization         Address  Phone  Notes  [facility]  [phone]   [facility]  [phone]   [facility] Mental Health [address], [place] [phone] or [phone]    Mobile Crisis Teams Organization         Address  Phone  Notes  [facility], Mobile Crisis Care Unit  [phone]   [facility]  [address] [place], [place] [phone]   Doristine Locks [address] [place] [phone]    Self-Help/Support Groups Organization         Address  Phone             Notes  [facility] [place] - variety of support groups  [phone]- [phone] Call for more information  Narcotics Anonymous (NA), [facility] [address], [employer] [place]  2 meetings at this location   Statistician         Address  Phone  Notes  [facility] [address],    [place]  [phone]   [facility]  [address], [place] [address], [place], [place] [phone]   [facility] [address] [place], [place] [phone] Admissions: 8am-3pm M-F  [facility] [address],    [place], [place] [phone]   [facility] [address] [place], [place], [place] [phone]   [facility] [address]  [place], [place] [phone]   [facility] - Intensive Outpatient [address] Laurell Josephs [address], [place], [place] [phone]   [facility] ([facility] [address] [place].,  [place], [place] [phone] or [phone]   [facility] ([facility]) [address] [place], [place] [phone] Accepts Medicaid  [facility] [place] [address]  [place] [phone] Substance Abuse/Addiction Treatment   [facility] Organization         Address  Phone  Notes  [facility]  [phone]   Angie Fava, PhD [address] Ervin Knack [place], [place]   [phone] or [phone]   [facility]   [address] [place], [place] [phone]   [facility] [address] [place], [place] [phone] Insurance/Medicaid/sponsorship through [facility] [address]                                    [place], [place] [phone] Therapy/tele-psych/case  [facility] [address], [place] [phone]    Dr. Lolly Mustache  [phone]   [facility]  [employer] [facility] Dept. 1) [address]  [place] 2) [address] [place] 3)  [address], [place] [phone]  [phone]   [facility] Child Abuse Hotline 713 754 1847 or [phone] (After Hours)

## 2013-12-06 NOTE — ED Notes (Signed)
C/O skin splitting on heels bilaterally.  RLE heel crack tender to touch when placing lotion on lower legs and feet.

## 2013-12-06 NOTE — ED Provider Notes (Signed)
CSN: [id number]     Arrival date & time 12/06/13  2104 History   First MD Initiated Contact with Patient 12/06/13 2228     Chief Complaint  Patient presents with  . Groin Swelling    and penis swelling   (Consider location/radiation/quality/duration/timing/severity/associated sxs/prior Treatment) The history is provided by the patient.   Carl Benson is a 58 y.o. male who presents for evaluation of recurrent abdominal swelling. He has had 3 separate paracentesis (therapeutic) for this. 2 were done in [place], and one was done here earlier this month. The abdominal distention is somewhat uncomfortable. He went to his usual dialysis today. He denies nausea, vomiting, fever, chills, diarrhea, or constipation. He does not have a local gastroenterologist, or primary care doctor. He has been in [place] about 5 weeks. He is getting dialysis 3 times a week. He is taking his usual medications. There are no other known modifying factors.   Past Medical History  Diagnosis Date  . COPD (chronic obstructive pulmonary disease)   . Hypertension   . Hep C w/ coma, chronic   . Irregular heartbeat   . ESRD (end stage renal disease) on dialysis     /notes 11/11/2013  . Smoker unmotivated to quit   . Active smoker   . PTSD (post-traumatic stress disorder)    Past Surgical History  Procedure Laterality Date  . Paracentesis  ~ 10/2013    [name]/notes 11/11/2013   No family history on file. History  Substance Use Topics  . Smoking status: Current Every Day Smoker -- 1.50 packs/day for 15 years    Types: Cigarettes  . Smokeless tobacco: Not on file  . Alcohol Use: No    Review of Systems  All other systems reviewed and are negative.    Allergies  Review of patient's allergies indicates no known allergies.  Home Medications   Current Outpatient Rx  Name  Route  Sig  Dispense  Refill  . albuterol (PROVENTIL HFA;VENTOLIN HFA) 108 (90 BASE) MCG/ACT inhaler   Inhalation   Inhale 1-2 puffs  into the lungs every 6 (six) hours as needed for wheezing or shortness of breath.         Marland Kitchen. amLODipine (NORVASC) 10 MG tablet   Oral   Take 1 tablet (10 mg total) by mouth daily.   30 tablet   1   . aspirin 81 MG EC tablet   Oral   Take 1 tablet (81 mg total) by mouth daily.   30 tablet   3   . b complex-vitamin c-folic acid (NEPHRO-VITE) 0.8 MG TABS tablet   Oral   Take 1 tablet by mouth at bedtime.   30 tablet   3   . calcium acetate (PHOSLO) 667 MG capsule   Oral   Take 2 capsules (1,334 mg total) by mouth 3 (three) times daily with meals.   90 capsule   3   . calcium carbonate (TUMS EX) 750 MG chewable tablet   Oral   Chew 2 tablets by mouth daily as needed for heartburn.         . diphenhydrAMINE (BENADRYL) 25 MG tablet   Oral   Take 1 tablet (25 mg total) by mouth every 6 (six) hours.   20 tablet   0   . docusate sodium (COLACE) 100 MG capsule   Oral   Take 1 capsule (100 mg total) by mouth daily.   30 capsule   3   . folic acid (FOLVITE)  1 MG tablet   Oral   Take 1 tablet (1 mg total) by mouth daily.   30 tablet   3   . hydrOXYzine (ATARAX/VISTARIL) 25 MG tablet   Oral   Take 1 tablet (25 mg total) by mouth every 6 (six) hours.   30 tablet   0   . latanoprost (XALATAN) 0.005 % ophthalmic solution   Both Eyes   Place 1 drop into both eyes at bedtime. Affected eye   2.5 mL   12   . metoprolol (LOPRESSOR) 50 MG tablet   Oral   Take 1 tablet (50 mg total) by mouth 2 (two) times daily.   60 tablet   1   . prazosin (MINIPRESS) 2 MG capsule   Oral   Take 2 capsules (4 mg total) by mouth at bedtime.   30 capsule   3   . traZODone (DESYREL) 25 mg TABS tablet   Oral   Take 0.5 tablets (25 mg total) by mouth at bedtime.   30 tablet   1    BP 146/102  Pulse 106  Temp(Src) 98 F (36.7 C) (Oral)  Resp 18  SpO2 94% Physical Exam  Nursing note and vitals reviewed. Constitutional: He is oriented to person, place, and time. He appears  well-developed. No distress.  He is malnourished  HENT:  Head: Normocephalic and atraumatic.  Right Ear: External ear normal.  Left Ear: External ear normal.  Eyes: Conjunctivae and EOM are normal. Pupils are equal, round, and reactive to light.  Neck: Normal range of motion and phonation normal. Neck supple.  Cardiovascular: Normal rate, regular rhythm, normal heart sounds and intact distal pulses.   Fistula left forearm has normal thrill, and is nontender  Pulmonary/Chest: Effort normal and breath sounds normal. He exhibits no bony tenderness.  Abdominal: Soft. Normal appearance. He exhibits distension (Consistent with ascites). He exhibits no mass. There is no tenderness. There is no guarding.  Musculoskeletal: Normal range of motion.  Neurological: He is alert and oriented to person, place, and time. No cranial nerve deficit or sensory deficit. He exhibits normal muscle tone. Coordination normal.  Skin: Skin is warm, dry and intact.  Psychiatric: He has a normal mood and affect. His behavior is normal. Judgment and thought content normal.    ED Course  Procedures (including critical care time)  Outpatient therapeutic paracentesis ordered to be done in 2 days, at the patient's request   Labs Review Labs Reviewed - No data to display Imaging Review No results found.  EKG Interpretation   None       MDM   1. Ascites   2. End stage renal disease     Abdominal ascites with end-stage renal disease. He has not yet established with a gastroenterologist. He does not have a primary care doctor. I doubt spontaneous bacterial peritonitis, pulmonary edema, serious bacterial infection or metabolic instability   Nursing Notes Reviewed/ Care Coordinated Applicable Imaging Reviewed Interpretation of Laboratory Data incorporated into ED treatment  The patient appears reasonably screened and/or stabilized for discharge and I doubt any other medical condition or other [facility] requiring  further screening, evaluation, or treatment in the ED at this time prior to discharge.  Plan: Home Medications- usual; Home Treatments- none; return here if the recommended treatment, does not improve the symptoms; Recommended follow up- GI and PCP of choice asap. OP Paracentesis in 2 days    Flint Melter, MD 12/06/13 2310

## 2013-12-06 NOTE — ED Provider Notes (Signed)
Medical screening examination/treatment/procedure(s) were performed by non-physician practitioner and as supervising physician I was immediately available for consultation/collaboration.  EKG Interpretation   None         Shelda Jakes, MD 12/06/13 [phone]

## 2013-12-09 ENCOUNTER — Emergency Department (HOSPITAL_COMMUNITY): Payer: Medicare Other

## 2013-12-09 ENCOUNTER — Emergency Department (HOSPITAL_COMMUNITY)
Admission: EM | Admit: 2013-12-09 | Discharge: 2013-12-09 | Discharge status: Against Medical Advice | Payer: Medicare Other | Source: Home / Self Care | Attending: Emergency Medicine | Admitting: Emergency Medicine

## 2013-12-09 ENCOUNTER — Encounter (HOSPITAL_COMMUNITY): Payer: Self-pay | Admitting: Emergency Medicine

## 2013-12-09 DIAGNOSIS — Z992 Dependence on renal dialysis: Secondary | ICD-10-CM

## 2013-12-09 DIAGNOSIS — Z91158 Patient's noncompliance with renal dialysis for other reason: Secondary | ICD-10-CM

## 2013-12-09 DIAGNOSIS — I120 Hypertensive chronic kidney disease with stage 5 chronic kidney disease or end stage renal disease: Secondary | ICD-10-CM | POA: Insufficient documentation

## 2013-12-09 DIAGNOSIS — F4310 Post-traumatic stress disorder, unspecified: Secondary | ICD-10-CM

## 2013-12-09 DIAGNOSIS — Z79899 Other long term (current) drug therapy: Secondary | ICD-10-CM

## 2013-12-09 DIAGNOSIS — F17200 Nicotine dependence, unspecified, uncomplicated: Secondary | ICD-10-CM | POA: Diagnosis present

## 2013-12-09 DIAGNOSIS — B182 Chronic viral hepatitis C: Secondary | ICD-10-CM | POA: Diagnosis present

## 2013-12-09 DIAGNOSIS — E230 Hypopituitarism: Secondary | ICD-10-CM

## 2013-12-09 DIAGNOSIS — Z7982 Long term (current) use of aspirin: Secondary | ICD-10-CM

## 2013-12-09 DIAGNOSIS — R1084 Generalized abdominal pain: Secondary | ICD-10-CM

## 2013-12-09 DIAGNOSIS — J449 Chronic obstructive pulmonary disease, unspecified: Secondary | ICD-10-CM | POA: Diagnosis present

## 2013-12-09 DIAGNOSIS — J441 Chronic obstructive pulmonary disease with (acute) exacerbation: Secondary | ICD-10-CM | POA: Insufficient documentation

## 2013-12-09 DIAGNOSIS — R143 Flatulence: Secondary | ICD-10-CM

## 2013-12-09 DIAGNOSIS — R141 Gas pain: Secondary | ICD-10-CM | POA: Insufficient documentation

## 2013-12-09 DIAGNOSIS — E875 Hyperkalemia: Secondary | ICD-10-CM | POA: Diagnosis present

## 2013-12-09 DIAGNOSIS — N186 End stage renal disease: Secondary | ICD-10-CM

## 2013-12-09 DIAGNOSIS — F3289 Other specified depressive episodes: Secondary | ICD-10-CM | POA: Diagnosis present

## 2013-12-09 DIAGNOSIS — F1410 Cocaine abuse, uncomplicated: Secondary | ICD-10-CM | POA: Diagnosis present

## 2013-12-09 DIAGNOSIS — J4489 Other specified chronic obstructive pulmonary disease: Secondary | ICD-10-CM | POA: Diagnosis present

## 2013-12-09 DIAGNOSIS — D6959 Other secondary thrombocytopenia: Secondary | ICD-10-CM | POA: Diagnosis present

## 2013-12-09 DIAGNOSIS — F329 Major depressive disorder, single episode, unspecified: Secondary | ICD-10-CM | POA: Diagnosis present

## 2013-12-09 DIAGNOSIS — R109 Unspecified abdominal pain: Secondary | ICD-10-CM

## 2013-12-09 DIAGNOSIS — K7460 Unspecified cirrhosis of liver: Secondary | ICD-10-CM | POA: Diagnosis present

## 2013-12-09 DIAGNOSIS — R188 Other ascites: Secondary | ICD-10-CM

## 2013-12-09 DIAGNOSIS — E8779 Other fluid overload: Principal | ICD-10-CM | POA: Diagnosis present

## 2013-12-09 DIAGNOSIS — I4891 Unspecified atrial fibrillation: Secondary | ICD-10-CM | POA: Diagnosis present

## 2013-12-09 DIAGNOSIS — Z8619 Personal history of other infectious and parasitic diseases: Secondary | ICD-10-CM

## 2013-12-09 DIAGNOSIS — R142 Eructation: Secondary | ICD-10-CM

## 2013-12-09 DIAGNOSIS — D509 Iron deficiency anemia, unspecified: Secondary | ICD-10-CM | POA: Diagnosis present

## 2013-12-09 DIAGNOSIS — R110 Nausea: Secondary | ICD-10-CM

## 2013-12-09 DIAGNOSIS — Z9115 Patient's noncompliance with renal dialysis: Secondary | ICD-10-CM

## 2013-12-09 LAB — COMPREHENSIVE METABOLIC PANEL
ALBUMIN: 2.8 g/dL — AB (ref 3.5–5.2)
ALT: 63 U/L — ABNORMAL HIGH (ref 0–53)
AST: 122 U/L — ABNORMAL HIGH (ref 0–37)
Alkaline Phosphatase: 229 U/L — ABNORMAL HIGH (ref 39–117)
BUN: 37 mg/dL — ABNORMAL HIGH (ref 6–23)
CO2: 25 mEq/L (ref 19–32)
CREATININE: 5.69 mg/dL — AB (ref 0.50–1.35)
Calcium: 9.3 mg/dL (ref 8.4–10.5)
Chloride: 90 mEq/L — ABNORMAL LOW (ref 96–112)
GFR calc Af Amer: 11 mL/min — ABNORMAL LOW (ref >90)
GFR calc non Af Amer: 10 mL/min — ABNORMAL LOW (ref >90)
Glucose, Bld: 106 mg/dL — ABNORMAL HIGH (ref 70–99)
Potassium: 4.2 mEq/L (ref 3.7–5.3)
Sodium: 137 mEq/L (ref 137–147)
TOTAL PROTEIN: 7.7 g/dL (ref 6.0–8.3)
Total Bilirubin: 2.2 mg/dL — ABNORMAL HIGH (ref 0.3–1.2)

## 2013-12-09 LAB — ALBUMIN, FLUID (OTHER): Albumin, Fluid: 1.3 g/dL

## 2013-12-09 LAB — CBC
HEMATOCRIT: 33.3 % — AB (ref 39.0–52.0)
Hemoglobin: 11.6 g/dL — ABNORMAL LOW (ref 13.0–17.0)
MCH: 27 pg (ref 26.0–34.0)
MCHC: 34.8 g/dL (ref 30.0–36.0)
MCV: 77.6 fL — AB (ref 78.0–100.0)
PLATELETS: 116 10*3/uL — AB (ref 150–400)
RBC: 4.29 MIL/uL (ref 4.22–5.81)
RDW: 21.5 % — AB (ref 11.5–15.5)
WBC: 9 10*3/uL (ref 4.0–10.5)

## 2013-12-09 LAB — POCT I-STAT TROPONIN I: TROPONIN I, POC: 0.12 ng/mL — AB (ref 0.00–0.08)

## 2013-12-09 LAB — LACTATE DEHYDROGENASE, PLEURAL OR PERITONEAL FLUID: LD FL: 97 U/L — AB (ref 3–23)

## 2013-12-09 LAB — PROTIME-INR
INR: 1.19 (ref 0.00–1.49)
PROTHROMBIN TIME: 14.8 s (ref 11.6–15.2)

## 2013-12-09 LAB — TROPONIN I
Troponin I: <0.3 ng/mL (ref <0.30)
Troponin I: <0.3 ng/mL (ref <0.30)

## 2013-12-09 LAB — LIPASE, BLOOD: Lipase: 137 U/L — ABNORMAL HIGH (ref 11–59)

## 2013-12-09 LAB — GLUCOSE, SEROUS FLUID: Glucose, Fluid: <20 mg/dL

## 2013-12-09 LAB — PROTEIN, BODY FLUID: Total protein, fluid: 3 g/dL

## 2013-12-09 MED ORDER — MORPHINE SULFATE 4 MG/ML IJ SOLN
4.0000 mg | Freq: Once | INTRAMUSCULAR | Status: AC
  Administered 2013-12-09: 4 mg via INTRAVENOUS
  Filled 2013-12-09: qty 1

## 2013-12-09 MED ORDER — ONDANSETRON HCL 4 MG/2ML IJ SOLN
4.0000 mg | Freq: Once | INTRAMUSCULAR | Status: AC
  Administered 2013-12-09: 4 mg via INTRAVENOUS
  Filled 2013-12-09: qty 2

## 2013-12-09 NOTE — ED Notes (Signed)
Pt. Was unable to wait for lab results.  Pt. Had to go to pay his landlord his rent.  Pt. Stated, "That Landlord is crazy"  , "I got to go".  "I feel better." Pt. Ate some of his lunch.  Pt.. Stated,. "I am going to get real food>"

## 2013-12-09 NOTE — ED Notes (Signed)
Pt. Refused to have his vitals done .

## 2013-12-09 NOTE — ED Notes (Signed)
Notified MD Hyacinth Meeker) and RN [facility]) of the i-stat trop. was 0.12

## 2013-12-09 NOTE — ED Provider Notes (Signed)
CSN: [id number]     Arrival date & time 12/09/13  0608 History   First MD Initiated Contact with Patient 12/09/13 [phone]     Chief Complaint  Patient presents with  . Abdominal Pain   (Consider location/radiation/quality/duration/timing/severity/associated sxs/prior Treatment) HPI Comments: Patient is a 58 year old male with a past medical history of chronic hepatitis C, ascites, end-stage renal disease on dialysis Tuesday, Thursday and Saturday, PTSD, hypertension and COPD who presents to the emergency department complaining of generalized lower abdominal pain beginning later last night. Admits to associated nausea without vomiting. Patient was seen in the emergency department on 12/02/13, 12/04/13 and 12/06/13 with the same, has a history of needing paracentesis, last on January 2. He is scheduled to have paracentesis done on February 2, however after calling his nephrologist last night patient was advised to come to the emergency department for evaluation. Patient last dialyzed yesterday, had 4 L of fluid taken off. He dialyzes at [facility]. Patient states his abdomen is more distended than normal. Admits to shortness of breath and productive cough which is normal for him. Denies fever, diarrhea, chest pain. Pt does not make urine.  Patient is a 58 y.o. male presenting with abdominal pain. The history is provided by the patient and the EMS personnel.  Abdominal Pain Associated symptoms: cough, nausea and shortness of breath     Past Medical History  Diagnosis Date  . COPD (chronic obstructive pulmonary disease)   . Hypertension   . Hep C w/ coma, chronic   . Irregular heartbeat   . ESRD (end stage renal disease) on dialysis     /notes 11/11/2013  . Smoker unmotivated to quit   . Active smoker   . PTSD (post-traumatic stress disorder)    Past Surgical History  Procedure Laterality Date  . Paracentesis  ~ 10/2013    [name]/notes 11/11/2013   No family history on file. History  Substance Use  Topics  . Smoking status: Current Every Day Smoker -- 1.50 packs/day for 15 years    Types: Cigarettes  . Smokeless tobacco: Not on file  . Alcohol Use: No    Review of Systems  Respiratory: Positive for cough and shortness of breath.   Gastrointestinal: Positive for nausea, abdominal pain and abdominal distention.  All other systems reviewed and are negative.    Allergies  Review of patient's allergies indicates no known allergies.  Home Medications   Current Outpatient Rx  Name  Route  Sig  Dispense  Refill  . albuterol (PROVENTIL HFA;VENTOLIN HFA) 108 (90 BASE) MCG/ACT inhaler   Inhalation   Inhale 1-2 puffs into the lungs every 6 (six) hours as needed for wheezing or shortness of breath.         Marland Kitchen. amLODipine (NORVASC) 10 MG tablet   Oral   Take 1 tablet (10 mg total) by mouth daily.   30 tablet   1   . aspirin 81 MG EC tablet   Oral   Take 1 tablet (81 mg total) by mouth daily.   30 tablet   3   . b complex-vitamin c-folic acid (NEPHRO-VITE) 0.8 MG TABS tablet   Oral   Take 1 tablet by mouth at bedtime.   30 tablet   3   . calcium acetate (PHOSLO) 667 MG capsule   Oral   Take 2 capsules (1,334 mg total) by mouth 3 (three) times daily with meals.   90 capsule   3   . calcium carbonate (TUMS EX) 750  MG chewable tablet   Oral   Chew 2 tablets by mouth daily as needed for heartburn.         . diphenhydrAMINE (BENADRYL) 25 MG tablet   Oral   Take 1 tablet (25 mg total) by mouth every 6 (six) hours.   20 tablet   0   . docusate sodium (COLACE) 100 MG capsule   Oral   Take 1 capsule (100 mg total) by mouth daily.   30 capsule   3   . folic acid (FOLVITE) 1 MG tablet   Oral   Take 1 tablet (1 mg total) by mouth daily.   30 tablet   3   . hydrOXYzine (ATARAX/VISTARIL) 25 MG tablet   Oral   Take 1 tablet (25 mg total) by mouth every 6 (six) hours.   30 tablet   0   . latanoprost (XALATAN) 0.005 % ophthalmic solution   Both Eyes   Place 1  drop into both eyes at bedtime. Affected eye   2.5 mL   12   . metoprolol (LOPRESSOR) 50 MG tablet   Oral   Take 1 tablet (50 mg total) by mouth 2 (two) times daily.   60 tablet   1   . prazosin (MINIPRESS) 2 MG capsule   Oral   Take 2 capsules (4 mg total) by mouth at bedtime.   30 capsule   3   . traZODone (DESYREL) 25 mg TABS tablet   Oral   Take 0.5 tablets (25 mg total) by mouth at bedtime.   30 tablet   1    BP 148/97  Pulse 88  Temp(Src) 98.1 F (36.7 C) (Oral)  Resp 16  Ht 5\' 5"  (1.651 m)  SpO2 99% Physical Exam  Nursing note and vitals reviewed. Constitutional: He is oriented to person, place, and time. He appears well-developed. He appears cachectic. No distress.  HENT:  Head: Normocephalic and atraumatic.  Mouth/Throat: Oropharynx is clear and moist.  Eyes: Conjunctivae are normal. No scleral icterus.  Neck: Normal range of motion. Neck supple.  Cardiovascular: Normal rate, regular rhythm and normal heart sounds.   Pulmonary/Chest: Effort normal and breath sounds normal.  Abdominal: Soft. Bowel sounds are normal. He exhibits distension and ascites. There is generalized tenderness. There is no rigidity, no rebound and no guarding.  No peritoneal signs.  Musculoskeletal: Normal range of motion. He exhibits no edema.  Neurological: He is alert and oriented to person, place, and time.  Skin: Skin is warm and dry. He is not diaphoretic.  Psychiatric: He has a normal mood and affect. His behavior is normal.    ED Course  Procedures (including critical care time) Labs Review Labs Reviewed  CBC - Abnormal; Notable for the following:    Hemoglobin 11.6 (*)    HCT 33.3 (*)    MCV 77.6 (*)    RDW 21.5 (*)    Platelets 116 (*)    All other components within normal limits  COMPREHENSIVE METABOLIC PANEL - Abnormal; Notable for the following:    Chloride 90 (*)    Glucose, Bld 106 (*)    BUN 37 (*)    Creatinine, Ser 5.69 (*)    Albumin 2.8 (*)    AST 122  (*)    ALT 63 (*)    Alkaline Phosphatase 229 (*)    Total Bilirubin 2.2 (*)    GFR calc non Af Amer 10 (*)    GFR calc Af Amer 11 (*)  All other components within normal limits  LIPASE, BLOOD - Abnormal; Notable for the following:    Lipase 137 (*)    All other components within normal limits  LACTATE DEHYDROGENASE, BODY FLUID - Abnormal; Notable for the following:    LD, Fluid 97 (*)    All other components within normal limits  POCT I-STAT TROPONIN I - Abnormal; Notable for the following:    Troponin i, poc 0.12 (*)    All other components within normal limits  BODY FLUID CULTURE  PROTIME-INR  TROPONIN I  PROTEIN, BODY FLUID  ALBUMIN, FLUID  TROPONIN I  GLUCOSE, SEROUS FLUID   Imaging Review No results found.  EKG Interpretation    Date/Time:  Friday December 09 2013 07:00:29 EST Ventricular Rate:  91 PR Interval:  121 QRS Duration: 97 QT Interval:  390 QTC Calculation: 480 R Axis:   -32 Text Interpretation:  Sinus rhythm Biatrial enlargement LVH with secondary repolarization abnormality Borderline prolonged QT interval No significant change was found Confirmed by RANCOUR  MD, STEPHEN ([id number]) on 12/09/2013 8:03:12 AM            MDM   1. Ascites   2. Abdominal pain      Pt presenting with abdominal pain, distension, known ascites. He appears in NAD. Afebrile with no peritoneal signs, doubt peritonitis. Scheduled for paracentesis on 2/2, advised by nephrologist to come through ED. Labs pending. 8:25 AM istat troponin elevated at 0.12, pt hx of renal failure. Regular troponin normal. Will check 3-hour troponin. Labs near baseline. Pt reports his pain has greatly subsided. US paracentesis ordered, will have fluid analysis done. 11:45 AM Paracentesis complete. Peritoneal fluid analysis still in process. 3-hour troponin in-process. Pt requests to leave, will leave AMA. States he needs to pay rent. Discussed importance of waiting for results. He would rather leave.  Return precautions given.  Trevor Mace, PA-C 12/09/13 1147

## 2013-12-09 NOTE — ED Notes (Signed)
Order Renal Tray 

## 2013-12-09 NOTE — ED Notes (Signed)
Patient presents to ED via [facility]. Patient is a dialysis patient-last treatment Thursday 12-08-2013. Patient states that tonight he started having right and left lower abdominal pain, worse during palpation. Denies any N/V/D. Denies any shortness of breath. Rating pain 5/10. Patient states "the only reason I am here is because I need more fluid pulled off, they didn't pull enough off last time." A&O x4 upon arrival to ED. No acute distress noted at this time.

## 2013-12-09 NOTE — Procedures (Signed)
Successful US guided paracentesis from [facility].  Yielded 1.2 liters of yellow fluid.  No immediate complications.  Pt tolerated well.   Specimen was sent for labs.  Pattricia Boss D PA-C 12/09/2013 3:50 PM

## 2013-12-09 NOTE — ED Notes (Signed)
Explained plan of care and procedure of US paracentesis,  Pt. Verbalized understanding

## 2013-12-11 ENCOUNTER — Encounter (HOSPITAL_COMMUNITY): Payer: Self-pay | Admitting: Emergency Medicine

## 2013-12-11 ENCOUNTER — Inpatient Hospital Stay (HOSPITAL_COMMUNITY)
Admission: EM | Admit: 2013-12-11 | Discharge: 2013-12-12 | DRG: 640 | Discharge status: Against Medical Advice | Payer: Medicare Other | Attending: Internal Medicine | Admitting: Internal Medicine

## 2013-12-11 DIAGNOSIS — K7460 Unspecified cirrhosis of liver: Secondary | ICD-10-CM

## 2013-12-11 DIAGNOSIS — B182 Chronic viral hepatitis C: Secondary | ICD-10-CM | POA: Diagnosis present

## 2013-12-11 DIAGNOSIS — N186 End stage renal disease: Secondary | ICD-10-CM

## 2013-12-11 DIAGNOSIS — E875 Hyperkalemia: Secondary | ICD-10-CM | POA: Diagnosis present

## 2013-12-11 DIAGNOSIS — I480 Paroxysmal atrial fibrillation: Secondary | ICD-10-CM | POA: Diagnosis present

## 2013-12-11 DIAGNOSIS — I10 Essential (primary) hypertension: Secondary | ICD-10-CM | POA: Diagnosis present

## 2013-12-11 DIAGNOSIS — Z992 Dependence on renal dialysis: Secondary | ICD-10-CM

## 2013-12-11 DIAGNOSIS — J449 Chronic obstructive pulmonary disease, unspecified: Secondary | ICD-10-CM | POA: Diagnosis present

## 2013-12-11 DIAGNOSIS — R188 Other ascites: Secondary | ICD-10-CM | POA: Diagnosis present

## 2013-12-11 DIAGNOSIS — E8770 Fluid overload, unspecified: Secondary | ICD-10-CM | POA: Diagnosis present

## 2013-12-11 NOTE — ED Provider Notes (Signed)
CSN: [id number]     Arrival date & time 12/11/13  2239 History   First MD Initiated Contact with Patient 12/11/13 2256     Chief Complaint  Patient presents with  . Groin Swelling   (Consider location/radiation/quality/duration/timing/severity/associated sxs/prior Treatment) HPI  This is a 58 year old male who presents with abdominal and groin swelling. Patient has a history of COPD, hypertension, end-stage renal disease on dialysis Tuesday Thursday and Saturday who presents with abdominal pain and groin swelling. Patient has presented multiple times recently to the ER for similar complaints. Was last seen on January 30 and had an ultrasound-guided paracentesis at that time. Patient reports that he last dialyzed on Saturday. He states that when he has fluid acutely in his bili he sometimes gets groin swelling. He has no other complaints. He denies any abdominal pain, shortness of breath, chest pain, fevers. Patient does not urinate at baseline. Past Medical History  Diagnosis Date  . COPD (chronic obstructive pulmonary disease)   . Hypertension   . Hep C w/ coma, chronic   . Irregular heartbeat   . ESRD (end stage renal disease) on dialysis     /notes 11/11/2013  . Smoker unmotivated to quit   . Active smoker   . PTSD (post-traumatic stress disorder)    Past Surgical History  Procedure Laterality Date  . Paracentesis  ~ 10/2013    [name] 11/11/2013   No family history on file. History  Substance Use Topics  . Smoking status: Current Every Day Smoker -- 1.50 packs/day for 15 years    Types: Cigarettes  . Smokeless tobacco: Not on file  . Alcohol Use: No    Review of Systems  Constitutional: Negative.  Negative for fever.  Respiratory: Negative.  Negative for chest tightness and shortness of breath.   Cardiovascular: Negative.  Negative for chest pain.  Gastrointestinal: Positive for abdominal distention. Negative for abdominal pain.  Genitourinary: Positive for penile swelling and  scrotal swelling. Negative for dysuria.  Musculoskeletal: Negative for back pain.  Skin: Negative for rash.  Neurological: Negative for weakness and headaches.  Psychiatric/Behavioral: Negative for confusion.  All other systems reviewed and are negative.    Allergies  Review of patient's allergies indicates no known allergies.  Home Medications   Current Outpatient Rx  Name  Route  Sig  Dispense  Refill  . albuterol (PROVENTIL HFA;VENTOLIN HFA) 108 (90 BASE) MCG/ACT inhaler   Inhalation   Inhale 1-2 puffs into the lungs every 6 (six) hours as needed for wheezing or shortness of breath.         Marland Kitchen amLODipine (NORVASC) 10 MG tablet   Oral   Take 1 tablet (10 mg total) by mouth daily.   30 tablet   1   . aspirin 81 MG EC tablet   Oral   Take 1 tablet (81 mg total) by mouth daily.   30 tablet   3   . b complex-vitamin c-folic acid (NEPHRO-VITE) 0.8 MG TABS tablet   Oral   Take 1 tablet by mouth at bedtime.   30 tablet   3   . calcium acetate (PHOSLO) 667 MG capsule   Oral   Take 2 capsules (1,334 mg total) by mouth 3 (three) times daily with meals.   90 capsule   3   . calcium carbonate (TUMS EX) 750 MG chewable tablet   Oral   Chew 2 tablets by mouth daily as needed for heartburn.         Marland Kitchen  diphenhydrAMINE (BENADRYL) 25 MG tablet   Oral   Take 1 tablet (25 mg total) by mouth every 6 (six) hours.   20 tablet   0   . docusate sodium (COLACE) 100 MG capsule   Oral   Take 1 capsule (100 mg total) by mouth daily.   30 capsule   3   . folic acid (FOLVITE) 1 MG tablet   Oral   Take 1 tablet (1 mg total) by mouth daily.   30 tablet   3   . hydrOXYzine (ATARAX/VISTARIL) 25 MG tablet   Oral   Take 1 tablet (25 mg total) by mouth every 6 (six) hours.   30 tablet   0   . latanoprost (XALATAN) 0.005 % ophthalmic solution   Both Eyes   Place 1 drop into both eyes at bedtime. Affected eye   2.5 mL   12   . metoprolol (LOPRESSOR) 50 MG tablet   Oral    Take 1 tablet (50 mg total) by mouth 2 (two) times daily.   60 tablet   1   . prazosin (MINIPRESS) 2 MG capsule   Oral   Take 2 capsules (4 mg total) by mouth at bedtime.   30 capsule   3   . traZODone (DESYREL) 25 mg TABS tablet   Oral   Take 0.5 tablets (25 mg total) by mouth at bedtime.   30 tablet   1    BP 146/104  Pulse 92  Temp(Src) 97.9 F (36.6 C) (Oral)  Resp 14  SpO2 99% Physical Exam  Nursing note and vitals reviewed. Constitutional: He is oriented to person, place, and time. No distress.  Chronically ill-appearing, no acute distress  HENT:  Head: Normocephalic and atraumatic.  Mucous membranes dry  Eyes: Pupils are equal, round, and reactive to light.  Neck: No JVD present.  Cardiovascular: Normal rate, regular rhythm and normal heart sounds.   No murmur heard. Pulmonary/Chest: Effort normal and breath sounds normal. No respiratory distress. He has no wheezes.  Abdominal: Soft. Bowel sounds are normal. He exhibits distension. There is no tenderness. There is no rebound.  Distended abdomen, soft, no tenderness, positive fluid wave  Genitourinary:  Mild swelling of the mons pubis,shaft of penis, and testicles, no errythema or crepitus noted.  Musculoskeletal: He exhibits edema.  Lymphadenopathy:    He has no cervical adenopathy.  Neurological: He is alert and oriented to person, place, and time.  Skin: Skin is warm and dry.  Psychiatric: He has a normal mood and affect.    ED Course  Procedures (including critical care time) Labs Review Labs Reviewed  CBC WITH DIFFERENTIAL - Abnormal; Notable for the following:    RBC 4.03 (*)    Hemoglobin 11.1 (*)    HCT 30.5 (*)    MCV 75.7 (*)    MCHC 36.4 (*)    RDW 21.6 (*)    Platelets 110 (*)    All other components within normal limits  COMPREHENSIVE METABOLIC PANEL - Abnormal; Notable for the following:    Sodium 133 (*)    Potassium 5.9 (*)    Chloride 89 (*)    CO2 17 (*)    Glucose, Bld 103 (*)     BUN 70 (*)    Creatinine, Ser 8.33 (*)    Albumin 2.5 (*)    AST 124 (*)    ALT 65 (*)    Alkaline Phosphatase 204 (*)    Total Bilirubin 1.9 (*)  GFR calc non Af Amer 6 (*)    GFR calc Af Amer 7 (*)    All other components within normal limits  GLUCOSE, CAPILLARY - Abnormal; Notable for the following:    Glucose-Capillary 111 (*)    All other components within normal limits  RENAL FUNCTION PANEL  CBC  RENAL FUNCTION PANEL  PROTIME-INR  FERRITIN  IRON AND TIBC  APTT   Imaging Review X-ray Chest Pa And Lateral   12/12/2013   CLINICAL DATA:  Shortness of breath and constipation  EXAM: CHEST  2 VIEW  COMPARISON:  12/09/2013  FINDINGS: Streaky lower lung opacities, especially at the left base. The could be a trace left pleural effusion. Mild chronic cardiomegaly. No acute osseous findings.  IMPRESSION: Lower lung atelectasis.  No edema or consolidation.   Electronically Signed   By: Tiburcio PeaJonathan  Watts M.D.   On: 12/12/2013 05:52    EKG Interpretation    Date/Time:  Monday December 12 2013 01:54:57 EST Ventricular Rate:  92 PR Interval:  122 QRS Duration: 90 QT Interval:  392 QTC Calculation: 485 R Axis:   -51 Text Interpretation:  Sinus rhythm Right atrial enlargement Probable LVH with secondary repol abnrm Abnormal T, probable ischemia, lateral leads No significant change since last tracing Confirmed by HORTON  MD, COURTNEY ([id number]) on 12/12/2013 2:02:41 AM            MDM   1. Hyperkalemia   2. Volume overload    Patient presents with groin swelling. Last bilateral Saturday but states he only half the session. He has a distended abdomen but is soft and low suspicion for SBP. He does have swelling of the groin. Lab work is notable for hyperkalemia with a K. of 5.9. EKG is reassuring without QRS widening. Patient was given insulin, glucose, and Kayexalate.  Given hyperkalemia and volume overload, patient will need dialysis. Discussed with nephrology. They agree with holding  on calcium gluconate at this time. Patient will be admitted.    Shon Batonourtney F Horton, MD 12/12/13 [phone]

## 2013-12-11 NOTE — ED Notes (Signed)
Dr. Wilkie Aye at bedside. Pt given ice chips.

## 2013-12-11 NOTE — ED Notes (Signed)
PER EMS: pt from home, reports genital "swelling from dialysis. It happens all of the time." pt gets dialysis on Tue/Thus/Sat. Right arm restricted. BP-152 palpated, HR-100, RR-18. Ambulatory, complaining of no pain. EMS reports that the Pt stated he has not urinated in a year. A&Ox4, reports no other symptoms.

## 2013-12-12 ENCOUNTER — Emergency Department (HOSPITAL_COMMUNITY): Payer: Medicare Other

## 2013-12-12 ENCOUNTER — Encounter (HOSPITAL_COMMUNITY): Payer: Self-pay | Admitting: *Deleted

## 2013-12-12 ENCOUNTER — Ambulatory Visit (HOSPITAL_COMMUNITY): Admission: RE | Admit: 2013-12-12 | Payer: Medicare Other | Source: Ambulatory Visit

## 2013-12-12 DIAGNOSIS — F1910 Other psychoactive substance abuse, uncomplicated: Secondary | ICD-10-CM

## 2013-12-12 DIAGNOSIS — E8770 Fluid overload, unspecified: Secondary | ICD-10-CM | POA: Diagnosis present

## 2013-12-12 DIAGNOSIS — R188 Other ascites: Secondary | ICD-10-CM

## 2013-12-12 DIAGNOSIS — E875 Hyperkalemia: Secondary | ICD-10-CM

## 2013-12-12 DIAGNOSIS — I120 Hypertensive chronic kidney disease with stage 5 chronic kidney disease or end stage renal disease: Secondary | ICD-10-CM

## 2013-12-12 DIAGNOSIS — J449 Chronic obstructive pulmonary disease, unspecified: Secondary | ICD-10-CM

## 2013-12-12 DIAGNOSIS — D509 Iron deficiency anemia, unspecified: Secondary | ICD-10-CM

## 2013-12-12 DIAGNOSIS — I4891 Unspecified atrial fibrillation: Secondary | ICD-10-CM

## 2013-12-12 DIAGNOSIS — K7460 Unspecified cirrhosis of liver: Secondary | ICD-10-CM

## 2013-12-12 DIAGNOSIS — N186 End stage renal disease: Secondary | ICD-10-CM

## 2013-12-12 DIAGNOSIS — Z992 Dependence on renal dialysis: Secondary | ICD-10-CM

## 2013-12-12 DIAGNOSIS — D696 Thrombocytopenia, unspecified: Secondary | ICD-10-CM

## 2013-12-12 LAB — RENAL FUNCTION PANEL
Albumin: 2.7 g/dL — ABNORMAL LOW (ref 3.5–5.2)
BUN: 70 mg/dL — AB (ref 6–23)
CO2: 21 mEq/L (ref 19–32)
CREATININE: 8.61 mg/dL — AB (ref 0.50–1.35)
Calcium: 8.6 mg/dL (ref 8.4–10.5)
Chloride: 89 mEq/L — ABNORMAL LOW (ref 96–112)
GFR calc Af Amer: 7 mL/min — ABNORMAL LOW (ref >90)
GFR calc non Af Amer: 6 mL/min — ABNORMAL LOW (ref >90)
Glucose, Bld: 84 mg/dL (ref 70–99)
PHOSPHORUS: 11.7 mg/dL — AB (ref 2.3–4.6)
Potassium: 4.9 mEq/L (ref 3.7–5.3)
Sodium: 138 mEq/L (ref 137–147)

## 2013-12-12 LAB — CBC WITH DIFFERENTIAL/PLATELET
BASOS ABS: 0 10*3/uL (ref 0.0–0.1)
Basophils Relative: 0 % (ref 0–1)
Eosinophils Absolute: 0.1 10*3/uL (ref 0.0–0.7)
Eosinophils Relative: 1 % (ref 0–5)
HCT: 30.5 % — ABNORMAL LOW (ref 39.0–52.0)
Hemoglobin: 11.1 g/dL — ABNORMAL LOW (ref 13.0–17.0)
LYMPHS PCT: 16 % (ref 12–46)
Lymphs Abs: 1.3 10*3/uL (ref 0.7–4.0)
MCH: 27.5 pg (ref 26.0–34.0)
MCHC: 36.4 g/dL — ABNORMAL HIGH (ref 30.0–36.0)
MCV: 75.7 fL — ABNORMAL LOW (ref 78.0–100.0)
MONOS PCT: 11 % (ref 3–12)
Monocytes Absolute: 0.9 10*3/uL (ref 0.1–1.0)
Neutro Abs: 6 10*3/uL (ref 1.7–7.7)
Neutrophils Relative %: 72 % (ref 43–77)
PLATELETS: 110 10*3/uL — AB (ref 150–400)
RBC: 4.03 MIL/uL — ABNORMAL LOW (ref 4.22–5.81)
RDW: 21.6 % — AB (ref 11.5–15.5)
WBC: 8.3 10*3/uL (ref 4.0–10.5)

## 2013-12-12 LAB — PROTIME-INR
INR: 1.16 (ref 0.00–1.49)
Prothrombin Time: 14.6 seconds (ref 11.6–15.2)

## 2013-12-12 LAB — COMPREHENSIVE METABOLIC PANEL
ALK PHOS: 204 U/L — AB (ref 39–117)
ALT: 65 U/L — ABNORMAL HIGH (ref 0–53)
AST: 124 U/L — ABNORMAL HIGH (ref 0–37)
Albumin: 2.5 g/dL — ABNORMAL LOW (ref 3.5–5.2)
BUN: 70 mg/dL — ABNORMAL HIGH (ref 6–23)
CALCIUM: 8.5 mg/dL (ref 8.4–10.5)
CO2: 17 mEq/L — ABNORMAL LOW (ref 19–32)
Chloride: 89 mEq/L — ABNORMAL LOW (ref 96–112)
Creatinine, Ser: 8.33 mg/dL — ABNORMAL HIGH (ref 0.50–1.35)
GFR calc Af Amer: 7 mL/min — ABNORMAL LOW (ref >90)
GFR calc non Af Amer: 6 mL/min — ABNORMAL LOW (ref >90)
Glucose, Bld: 103 mg/dL — ABNORMAL HIGH (ref 70–99)
POTASSIUM: 5.9 meq/L — AB (ref 3.7–5.3)
SODIUM: 133 meq/L — AB (ref 137–147)
TOTAL PROTEIN: 7 g/dL (ref 6.0–8.3)
Total Bilirubin: 1.9 mg/dL — ABNORMAL HIGH (ref 0.3–1.2)

## 2013-12-12 LAB — CBC
HCT: 33.3 % — ABNORMAL LOW (ref 39.0–52.0)
Hemoglobin: 11.8 g/dL — ABNORMAL LOW (ref 13.0–17.0)
MCH: 27 pg (ref 26.0–34.0)
MCHC: 35.4 g/dL (ref 30.0–36.0)
MCV: 76.2 fL — AB (ref 78.0–100.0)
Platelets: 103 10*3/uL — ABNORMAL LOW (ref 150–400)
RBC: 4.37 MIL/uL (ref 4.22–5.81)
RDW: 21.6 % — ABNORMAL HIGH (ref 11.5–15.5)
WBC: 7.7 10*3/uL (ref 4.0–10.5)

## 2013-12-12 LAB — BODY FLUID CULTURE: Culture: NO GROWTH

## 2013-12-12 LAB — IRON AND TIBC
IRON: 79 ug/dL (ref 42–135)
Saturation Ratios: 22 % (ref 20–55)
TIBC: 354 ug/dL (ref 215–435)
UIBC: 275 ug/dL (ref 125–400)

## 2013-12-12 LAB — FERRITIN: Ferritin: 286 ng/mL (ref 22–322)

## 2013-12-12 LAB — APTT: APTT: 32 s (ref 24–37)

## 2013-12-12 LAB — GLUCOSE, CAPILLARY: GLUCOSE-CAPILLARY: 111 mg/dL — AB (ref 70–99)

## 2013-12-12 MED ORDER — METOPROLOL TARTRATE 50 MG PO TABS
50.0000 mg | ORAL_TABLET | Freq: Two times a day (BID) | ORAL | Status: DC
  Administered 2013-12-12: 50 mg via ORAL
  Filled 2013-12-12: qty 2
  Filled 2013-12-12: qty 1

## 2013-12-12 MED ORDER — OXYCODONE HCL 5 MG PO TABS
5.0000 mg | ORAL_TABLET | Freq: Once | ORAL | Status: AC
  Administered 2013-12-12: 5 mg via ORAL
  Filled 2013-12-12: qty 1

## 2013-12-12 MED ORDER — SODIUM CHLORIDE 0.9 % IJ SOLN
3.0000 mL | Freq: Two times a day (BID) | INTRAMUSCULAR | Status: DC
  Administered 2013-12-12: 3 mL via INTRAVENOUS

## 2013-12-12 MED ORDER — PRAZOSIN HCL 2 MG PO CAPS
4.0000 mg | ORAL_CAPSULE | Freq: Every day | ORAL | Status: DC
  Filled 2013-12-12: qty 2

## 2013-12-12 MED ORDER — INSULIN ASPART 100 UNIT/ML ~~LOC~~ SOLN
10.0000 [IU] | Freq: Once | SUBCUTANEOUS | Status: AC
  Administered 2013-12-12: 10 [IU] via INTRAVENOUS

## 2013-12-12 MED ORDER — HEPARIN SODIUM (PORCINE) 5000 UNIT/ML IJ SOLN
5000.0000 [IU] | Freq: Three times a day (TID) | INTRAMUSCULAR | Status: DC
  Filled 2013-12-12 (×3): qty 1

## 2013-12-12 MED ORDER — DOXERCALCIFEROL 4 MCG/2ML IV SOLN
2.0000 ug | Freq: Once | INTRAVENOUS | Status: AC
  Administered 2013-12-12: 2 ug via INTRAVENOUS
  Filled 2013-12-12: qty 2

## 2013-12-12 MED ORDER — ALBUTEROL SULFATE (2.5 MG/3ML) 0.083% IN NEBU: 3.0000 mL | INHALATION_SOLUTION | Freq: Four times a day (QID) | RESPIRATORY_TRACT | Status: DC | PRN

## 2013-12-12 MED ORDER — DIPHENHYDRAMINE HCL 25 MG PO TABS
25.0000 mg | ORAL_TABLET | Freq: Four times a day (QID) | ORAL | Status: DC | PRN
  Filled 2013-12-12: qty 1

## 2013-12-12 MED ORDER — DOCUSATE SODIUM 100 MG PO CAPS
100.0000 mg | ORAL_CAPSULE | Freq: Every day | ORAL | Status: DC
  Administered 2013-12-12: 100 mg via ORAL
  Filled 2013-12-12: qty 1

## 2013-12-12 MED ORDER — DOXERCALCIFEROL 4 MCG/2ML IV SOLN
INTRAVENOUS | Status: AC
  Administered 2013-12-12: 2 ug via INTRAVENOUS
  Filled 2013-12-12: qty 2

## 2013-12-12 MED ORDER — OXYCODONE HCL 5 MG PO TABS
5.0000 mg | ORAL_TABLET | ORAL | Status: DC | PRN
  Administered 2013-12-12: 5 mg via ORAL
  Filled 2013-12-12: qty 1

## 2013-12-12 MED ORDER — DEXTROSE 50 % IV SOLN
1.0000 | Freq: Once | INTRAVENOUS | Status: AC
  Administered 2013-12-12: 50 mL via INTRAVENOUS
  Filled 2013-12-12: qty 50

## 2013-12-12 MED ORDER — INSULIN ASPART 100 UNIT/ML ~~LOC~~ SOLN
10.0000 [IU] | Freq: Once | SUBCUTANEOUS | Status: DC
  Filled 2013-12-12: qty 1

## 2013-12-12 MED ORDER — ASPIRIN EC 81 MG PO TBEC
81.0000 mg | DELAYED_RELEASE_TABLET | Freq: Every day | ORAL | Status: DC
  Administered 2013-12-12: 81 mg via ORAL
  Filled 2013-12-12: qty 1

## 2013-12-12 MED ORDER — AMLODIPINE BESYLATE 10 MG PO TABS
10.0000 mg | ORAL_TABLET | Freq: Every day | ORAL | Status: DC
  Administered 2013-12-12: 10 mg via ORAL
  Filled 2013-12-12: qty 1

## 2013-12-12 MED ORDER — SODIUM CHLORIDE 0.9 % IV SOLN
1.0000 g | Freq: Once | INTRAVENOUS | Status: DC
  Filled 2013-12-12: qty 10

## 2013-12-12 MED ORDER — SODIUM POLYSTYRENE SULFONATE 15 GM/60ML PO SUSP
30.0000 g | Freq: Once | ORAL | Status: AC
  Administered 2013-12-12: 30 g via ORAL
  Filled 2013-12-12: qty 120

## 2013-12-12 MED ORDER — CALCIUM ACETATE 667 MG PO CAPS
1334.0000 mg | ORAL_CAPSULE | Freq: Three times a day (TID) | ORAL | Status: DC
  Administered 2013-12-12 (×2): 1334 mg via ORAL
  Filled 2013-12-12 (×3): qty 2

## 2013-12-12 MED ORDER — CALCIUM CARBONATE ANTACID 500 MG PO CHEW
2.0000 | CHEWABLE_TABLET | Freq: Every day | ORAL | Status: DC | PRN
  Filled 2013-12-12: qty 2

## 2013-12-12 MED ORDER — FOLIC ACID 1 MG PO TABS
1.0000 mg | ORAL_TABLET | Freq: Every day | ORAL | Status: DC
  Administered 2013-12-12: 1 mg via ORAL
  Filled 2013-12-12: qty 1

## 2013-12-12 MED ORDER — IPRATROPIUM-ALBUTEROL 0.5-2.5 (3) MG/3ML IN SOLN: 3.0000 mL | Freq: Four times a day (QID) | RESPIRATORY_TRACT | Status: DC | PRN

## 2013-12-12 NOTE — ED Notes (Signed)
Patient requesting more pain medication. Dr. Wilkie Aye aware.

## 2013-12-12 NOTE — H&P (Signed)
Internal Medicine Attending Admission Note Date: 12/12/2013  Patient name: Carl Benson Medical record number: [id number] Date of birth: [birth year] Age: 58 y.o. Gender: male  I saw and evaluated the patient. I reviewed the resident's note and I agree with the resident's findings and plan as documented in the resident's note.  Carl Benson is a 58 year old man with a history of end-stage renal disease requiring hemodialysis, chronic hepatitis C with cirrhosis, hypertension, chronic obstructive pulmonary disease, paroxysmal atrial fibrillation, and depression who presents with a two-day history of abdominal swelling and scrotal edema. Although he claims he has been compliant with all of his hemodialysis sessions, further review of the records demonstrate this may not be the case. It appears he frequently stops his hemodialysis sessions an hour before scheduled completion which may result in inadequate fluid removal. During this visit he told some physicians that his last hemodialysis was on Thursday, yet he told us it was on Saturday. Other than the edema and associated abdominal discomfort he is without new complaints. Exam reveals abdominal distention with tenderness as well as scrotal edema and penile shaft edema. He gets his hemodialysis at [facility] and states he has transportation arranged to pick him up and take him to dialysis eliminating at least that potential barrier. He is admitted to the internal medicine teaching service for hemodialysis to remove fluid and correct his electrolyte abnormalities. We have consulted nephrology to assist with the hemodialysis. We will defer to them the decision on how many hemodialysis sessions are required prior to discharge home. While he is here we will try to educate him on the importance of compliance with hemodialysis and completing each dialysis run as scheduled in order to help maintain fluid homeostasis. Otherwise, we will continue his chronic outpatient  medications.

## 2013-12-12 NOTE — Consult Note (Signed)
[facility] Renal Consultation Note  Indication for Consultation:  Management of ESRD/hemodialysis; anemia, hypertension/volume and secondary hyperparathyroidism  HPI: Carl Benson is a 58 y.o. male with chronic liver cirrhosis secondary to Hepatitis C, COPD, atrial fibrillation, and ESRD on dialysis on TTS at the [facility] after moving from [place] last month to be closer to his two sons who presented to the ED last night after one day of worsening dyspnea, orthopnea, and nausea secondary to missed dialysis on Saturday 1/31.  On Friday 1/30 he presented to the ED with generalized abdominal pain and distension, believed to be secondary to increased ascites, and received paracentesis with 1.2 liters removed.  His last paracentesis was on 1/2.  He is a disabled veteran and has a appointment with the [place] in [place] on 2/13, but he has been mostly noncompliant with dialysis and never runs more than three hours of his four-hour treatment.   Dialysis Orders:  TTS @ [facility] 58.3 kg      4 hrs       2K/2.25Ca      400/A1.5     Heparin 3000 U      AVG @ RUA   Hectorol 2 mcg        Epogen 0       Venofer 50 mg on Thurs  Past Medical History  Diagnosis Date  . COPD (chronic obstructive pulmonary disease)   . Hypertension   . Hep C w/ coma, chronic   . Irregular heartbeat   . ESRD (end stage renal disease) on dialysis     /notes 11/11/2013  . Smoker unmotivated to quit   . Active smoker   . PTSD (post-traumatic stress disorder)    Past Surgical History  Procedure Laterality Date  . Paracentesis  ~ 10/2013    Archie Endo 11/11/2013   No family history on file.  Social History  He has smoked a pack of cigarettes a day since he was 58 years old.  He states that he previously drank alcohol and used illicit drugs, but quit four months ago.  No Known Allergies Prior to Admission medications   Medication Sig Start Date End Date Taking? Authorizing Provider  albuterol  (PROVENTIL HFA;VENTOLIN HFA) 108 (90 BASE) MCG/ACT inhaler Inhale 1-2 puffs into the lungs every 6 (six) hours as needed for wheezing or shortness of breath.   Yes Historical Provider, MD  amLODipine (NORVASC) 10 MG tablet Take 1 tablet (10 mg total) by mouth daily. 11/26/13  Yes Marjan Rabbani, MD  aspirin 81 MG EC tablet Take 1 tablet (81 mg total) by mouth daily. 11/17/13  Yes Cresenciano Genre, MD  b complex-vitamin c-folic acid (NEPHRO-VITE) 0.8 MG TABS tablet Take 1 tablet by mouth at bedtime. 11/17/13  Yes Juluis Mire, MD  calcium acetate (PHOSLO) 667 MG capsule Take 2 capsules (1,334 mg total) by mouth 3 (three) times daily with meals. 11/17/13  Yes Cresenciano Genre, MD  calcium carbonate (TUMS EX) 750 MG chewable tablet Chew 2 tablets by mouth daily as needed for heartburn.   Yes Historical Provider, MD  diphenhydrAMINE (BENADRYL) 25 MG tablet Take 1 tablet (25 mg total) by mouth every 6 (six) hours. 12/02/13  Yes Kalman Drape, MD  docusate sodium (COLACE) 100 MG capsule Take 1 capsule (100 mg total) by mouth daily. 11/17/13  Yes Marjan Rabbani, MD  folic acid (FOLVITE) 1 MG tablet Take 1 tablet (1 mg total) by mouth daily. 11/17/13  Yes Marjan Rabbani,  MD  hydrOXYzine (ATARAX/VISTARIL) 25 MG tablet Take 1 tablet (25 mg total) by mouth every 6 (six) hours. 11/27/13  Yes Ankit Nanavati, MD  latanoprost (XALATAN) 0.005 % ophthalmic solution Place 1 drop into both eyes at bedtime. Affected eye 11/17/13  Yes Juluis Mire, MD  metoprolol (LOPRESSOR) 50 MG tablet Take 1 tablet (50 mg total) by mouth 2 (two) times daily. 11/17/13  Yes Cresenciano Genre, MD  prazosin (MINIPRESS) 2 MG capsule Take 2 capsules (4 mg total) by mouth at bedtime. 11/17/13  Yes Cresenciano Genre, MD  traZODone (DESYREL) 25 mg TABS tablet Take 0.5 tablets (25 mg total) by mouth at bedtime. 11/21/13  Yes Juluis Mire, MD   Labs:  Results for orders placed during the hospital encounter of 12/11/13 (from the past 48 hour(s))  CBC WITH DIFFERENTIAL      Status: Abnormal   Collection Time    12/12/13  1:03 AM      Result Value Range   WBC 8.3  4.0 - 10.5 K/uL   RBC 4.03 (*) 4.22 - 5.81 MIL/uL   Hemoglobin 11.1 (*) 13.0 - 17.0 g/dL   HCT 30.5 (*) 39.0 - 52.0 %   MCV 75.7 (*) 78.0 - 100.0 fL   MCH 27.5  26.0 - 34.0 pg   MCHC 36.4 (*) 30.0 - 36.0 g/dL   RDW 21.6 (*) 11.5 - 15.5 %   Platelets 110 (*) 150 - 400 K/uL   Comment: SPECIMEN CHECKED FOR CLOTS     REPEATED TO VERIFY     PLATELET COUNT CONFIRMED BY SMEAR   Neutrophils Relative % 72  43 - 77 %   Lymphocytes Relative 16  12 - 46 %   Monocytes Relative 11  3 - 12 %   Eosinophils Relative 1  0 - 5 %   Basophils Relative 0  0 - 1 %   Neutro Abs 6.0  1.7 - 7.7 K/uL   Lymphs Abs 1.3  0.7 - 4.0 K/uL   Monocytes Absolute 0.9  0.1 - 1.0 K/uL   Eosinophils Absolute 0.1  0.0 - 0.7 K/uL   Basophils Absolute 0.0  0.0 - 0.1 K/uL   RBC Morphology POLYCHROMASIA PRESENT     Comment: BURR CELLS     TARGET CELLS     SPHEROCYTES   WBC Morphology ATYPICAL LYMPHOCYTES    COMPREHENSIVE METABOLIC PANEL     Status: Abnormal   Collection Time    12/12/13  1:03 AM      Result Value Range   Sodium 133 (*) 137 - 147 mEq/L   Potassium 5.9 (*) 3.7 - 5.3 mEq/L   Chloride 89 (*) 96 - 112 mEq/L   CO2 17 (*) 19 - 32 mEq/L   Glucose, Bld 103 (*) 70 - 99 mg/dL   BUN 70 (*) 6 - 23 mg/dL   Creatinine, Ser 8.33 (*) 0.50 - 1.35 mg/dL   Calcium 8.5  8.4 - 10.5 mg/dL   Total Protein 7.0  6.0 - 8.3 g/dL   Albumin 2.5 (*) 3.5 - 5.2 g/dL   AST 124 (*) 0 - 37 U/L   ALT 65 (*) 0 - 53 U/L   Alkaline Phosphatase 204 (*) 39 - 117 U/L   Total Bilirubin 1.9 (*) 0.3 - 1.2 mg/dL   GFR calc non Af Amer 6 (*) >90 mL/min   GFR calc Af Amer 7 (*) >90 mL/min   Comment: (NOTE)     The eGFR has been calculated using  the CKD EPI equation.     This calculation has not been validated in all clinical situations.     eGFR's persistently <90 mL/min signify possible Chronic Kidney     Disease.  GLUCOSE, CAPILLARY      Status: Abnormal   Collection Time    12/12/13  2:21 AM      Result Value Range   Glucose-Capillary 111 (*) 70 - 99 mg/dL  CBC     Status: Abnormal   Collection Time    12/12/13  7:52 AM      Result Value Range   WBC 7.7  4.0 - 10.5 K/uL   RBC 4.37  4.22 - 5.81 MIL/uL   Hemoglobin 11.8 (*) 13.0 - 17.0 g/dL   HCT 33.3 (*) 39.0 - 52.0 %   MCV 76.2 (*) 78.0 - 100.0 fL   MCH 27.0  26.0 - 34.0 pg   MCHC 35.4  30.0 - 36.0 g/dL   RDW 21.6 (*) 11.5 - 15.5 %   Platelets 103 (*) 150 - 400 K/uL   Comment: CRITICAL VALUE NOTED.  VALUE IS CONSISTENT WITH PREVIOUSLY REPORTED AND CALLED VALUE.  RENAL FUNCTION PANEL     Status: Abnormal   Collection Time    12/12/13  7:52 AM      Result Value Range   Sodium 138  137 - 147 mEq/L   Potassium 4.9  3.7 - 5.3 mEq/L   Chloride 89 (*) 96 - 112 mEq/L   CO2 21  19 - 32 mEq/L   Glucose, Bld 84  70 - 99 mg/dL   BUN 70 (*) 6 - 23 mg/dL   Creatinine, Ser 8.61 (*) 0.50 - 1.35 mg/dL   Calcium 8.6  8.4 - 10.5 mg/dL   Phosphorus 11.7 (*) 2.3 - 4.6 mg/dL   Albumin 2.7 (*) 3.5 - 5.2 g/dL   GFR calc non Af Amer 6 (*) >90 mL/min   GFR calc Af Amer 7 (*) >90 mL/min   Comment: (NOTE)     The eGFR has been calculated using the CKD EPI equation.     This calculation has not been validated in all clinical situations.     eGFR's persistently <90 mL/min signify possible Chronic Kidney     Disease.  PROTIME-INR     Status: None   Collection Time    12/12/13  7:52 AM      Result Value Range   Prothrombin Time 14.6  11.6 - 15.2 seconds   INR 1.16  0.00 - 1.49  APTT     Status: None   Collection Time    12/12/13  7:52 AM      Result Value Range   aPTT 32  24 - 37 seconds   Constitutional: negative for chills, fatigue, fevers and sweats Ears, nose, mouth, throat, and face: negative for earaches, hoarseness, nasal congestion and sore throat Respiratory: positive for dyspnea on exertion, negative for cough, hemoptysis and sputum Cardiovascular: positive for  dyspnea, orthopnea and paroxysmal nocturnal dyspnea, negative for chest pain and palpitations Gastrointestinal: positive for abdominal pain and nausea, negative for change in bowel habits and vomiting Genitourinary:negative, anuric Musculoskeletal:negative for arthralgias, back pain, myalgias and neck pain Neurological: negative for dizziness, gait problems, headaches, paresthesia and speech problems  Physical Exam: Filed Vitals:   12/12/13 1012  BP: 142/97  Pulse: 91  Temp: 97.4 F (36.3 C)  Resp: 20     General appearance: alert, cooperative and no distress Head: Normocephalic, without obvious abnormality, atraumatic  Neck: no adenopathy, no carotid bruit, no JVD and supple, symmetrical, trachea midline Resp: Scattered rales with crackles at bases Cardio: regular rate & rhythm, S1, S2 normal, no murmur, click, rub or gallop GI: distended, tender , + BS Extremities: 1+ edema bilaterally in lower extremities Neurologic: Grossly normal Dialysis Access: AVG @ RUA with + bruit  Assessment/Plan: 1. Dyspnea - secondary to missed HD 1/31, but also with increased ascites. 2. Ascites - secondary to chronic liver cirrhosis due to Hepatitis C, s/p paracentesis with 1.2 L removed 1/30. 3. Hyperkalemia - K initially 5.9, now 4.9.  4. ESRD - HD on TTS @ [facility].  HD pending today. 5. Hypertension/volume - BP 142/97 on Amlodipine 10 mg qd, Metoprolol 50 mg bid; 65.8 kg today, 62.2 post last HD, uncertain gain sec to ascites.  Attempt 4 L today.  6. Anemia - Hgb 11.8, weekly Venofer, but no outpatient Epogen. 7. Metabolic bone disease - Ca 8.6 (9.6 corrected), P 11.7, last iPTH 507; Hectorol 2 mcg, Phoslo 2 with meals. 8. Nutrition - Alb 2.7, renal diet & vitamin. 9. PAF - currently normal sinus rhythm, BB & ASA, no Coumadin. 10. COPD - long smoking Hx  Carl Benson 12/12/2013, 10:27 AM   Attending Nephrologist: Edrick Oh, MD

## 2013-12-12 NOTE — ED Notes (Signed)
Pt reporting he is in severe pain. Offered patient oxycodone but patient refused and stated that it was not helping and he also revealed to me that he has a drug abuse history and does not want to take any narcotic pain medication.

## 2013-12-12 NOTE — Progress Notes (Signed)
12/12/2013 6:48 PM  Pt came back from hemodialysis very agitated that he was still having to stay in the hospital.  Pt quickly became verbally abusive, and threatening physical violence if we did not help him.  Security was called.  I informed the patient that the MD has not placed an order to discharge home, and he could either wait for the MD to come up and speak with him, he could sign an AMA form and leave, or he could wait until the doctor deemed it necessary for him to be discharged either tonight or tomorrow.  Pt was still very verbally abusive, physically threatening harm.  MD on call returned page, stated he was not clear for DC and would have to sign out AMA.  AMA form offered to patient by Mardelle Matte, RN, and patient signed.  Security present for RN to DC IV and telemetry.  No discharge instructions were offered by MD to give to the patient. Pt currently in room gathering belongings. Security to walk patient out of the building. Theadora Rama

## 2013-12-12 NOTE — ED Notes (Signed)
Pt reports pain and requesting to be dialysized. Informed him there are no beds available upstairs. Patient mad that the door was closed. Informed him that I was sorry and that I indeed had not lied to him about anything tonight.

## 2013-12-12 NOTE — ED Notes (Signed)
phlebotomy at bedside.  

## 2013-12-12 NOTE — Progress Notes (Signed)
Admission note:  Arrival Method: from ED via stretcher Mental Orientation: A&Ox4 Telemetry: Yes, box 20 applied Assessment: See docflowsheets Skin: Dry, dark patches to legs, cracked right heel IV: SL LFA Pain: 8/10 abdominal pain Tubes: N/A Safety Measures: Patient Handbook has been given, and discussed the Fall Prevention worksheet. Left at bedside  Admission: Completed and admission orders have been written  6E Orientation: Patient has been oriented to the unit, staff and to the room.  Family: None at the bedside

## 2013-12-12 NOTE — ED Provider Notes (Signed)
Medical screening examination/treatment/procedure(s) were performed by non-physician practitioner and as supervising physician I was immediately available for consultation/collaboration.    Vida Roller, MD 12/12/13 1150

## 2013-12-12 NOTE — Progress Notes (Signed)
Called by the nurse that the patient is leaving AMA. Went over to speak with the patient by found him in the hallway. He cursed me and refused to talk to me only saying that he his PTSD was flaring.

## 2013-12-12 NOTE — ED Notes (Signed)
Pt threatening to leave. sts "nobody is doing anything for me so I may as well just go home". Pt reminded we have done bld work and offered pain meds multiple time. Pt continues to refuse pain meds.

## 2013-12-12 NOTE — ED Notes (Signed)
Pt c/o pain to groin. sts we need to do something about it. Pt asked if he wanted pain medicine, pt refused.

## 2013-12-12 NOTE — ED Notes (Signed)
This RN pushed dextrose 50% solution in 90ml through IV slowly. No signs of infiltration noted.

## 2013-12-12 NOTE — Consult Note (Signed)
I have seen and examined this patient and agree with the plan of care   [facility] W 12/12/2013, 12:32 PM

## 2013-12-12 NOTE — Progress Notes (Signed)
Pt called me into his room and said he wanted to leave AMA. Pt said he was withdrawing from dope, and needed to leave now, and could feel himself getting more and more irritable, and didn't want to get to the point where we would have to call security. I told the pt that would be fine, that I just needed to notify the MD, and then he could leave. While I was in another room, the pt walked up to the front desk and became irate, and threatened staff. Security had to be called. Pt's IV removed. Tele removed. Pt signed AMA form. Pt wheeled down to emergency room exit, escorted by security.

## 2013-12-12 NOTE — ED Notes (Signed)
Pt requesting ice chips. Pt given 2 cups of ice.

## 2013-12-12 NOTE — H&P (Signed)
Date: 12/12/2013               Patient Name:  Carl Benson A Portier MRN: [id number]  DOB: Jun 19, 1956 Age / Sex: 58 y.o., male   PCP: No Pcp Per Patient         Medical Service: Internal Medicine Teaching Service         Attending Physician: Dr. Shon Batonourtney F Horton, MD    First Contact: Dr. Lars MassonEden Jones Pager: [phone]  Second Contact: Dr. Charlsie MerlesKathryn Glenn Pager: [phone]       After Hours (After 5p/  First Contact Pager: [phone]  weekends / holidays): Second Contact Pager: [phone]   Chief Complaint: Abdominal and Groin Swelling.  History of Present Illness: Mr Carl Benson is a 58 y o male with PMH of HTN, COPD, ESRD on HD- TTsat, Chronic hep C with Cirrhosis, depression, paroxysmal A fib. Pt presented today with complaints of abdominal aswelling present for the past 2days, recurrent, says it reccurs after his HD sessions and before his next one. No vomiting, last BM was 2 days ago. Patient also endorses groin swelling also reoccurring after dialysis and before his next one. Leg swelling is present and chronic. Last dialysis was probably on Thursday- pt thinks. He dosent know his dry weight and says during HD not enough fluid is pulled off and sometimes too much fluid is pulled off. Pt says he recently moved here from [place]- 1st of January.  Patinet can sleep flat, no chest pain. Endorses cough present for ocver 6 months, non productive. Smokes- 1 pack /day since he was 58 years old. Denies any alcohol intake. Pt has had several admissions this month for same complaints- 23, 25, 27, 30th for- abdominal pain and ascites. Pt gets dialysized at [facility]. Ha sbeen on HD for the past 1 year, and is anuric. As per chat review, pt had ultrasound guided paracentesis, on 11/11/2013, and again- 12/09/2013 -1.2 liters of ascitic fluid drained. 11/11/2013- Final results, No organisms seen and no growth of any orgs. Results of 1/30- Final results- Pending.  Meds: Current Facility-Administered Medications  Medication Dose  Route Frequency Provider Last Rate Last Dose  . oxyCODONE (Oxy IR/ROXICODONE) immediate release tablet 5 mg  5 mg Oral Q4H PRN Linward Headlandyan K Brown, MD   5 mg at 12/12/13 0424   Current Outpatient Prescriptions  Medication Sig Dispense Refill  . albuterol (PROVENTIL HFA;VENTOLIN HFA) 108 (90 BASE) MCG/ACT inhaler Inhale 1-2 puffs into the lungs every 6 (six) hours as needed for wheezing or shortness of breath.      Marland Kitchen. amLODipine (NORVASC) 10 MG tablet Take 1 tablet (10 mg total) by mouth daily.  30 tablet  1  . aspirin 81 MG EC tablet Take 1 tablet (81 mg total) by mouth daily.  30 tablet  3  . b complex-vitamin c-folic acid (NEPHRO-VITE) 0.8 MG TABS tablet Take 1 tablet by mouth at bedtime.  30 tablet  3  . calcium acetate (PHOSLO) 667 MG capsule Take 2 capsules (1,334 mg total) by mouth 3 (three) times daily with meals.  90 capsule  3  . calcium carbonate (TUMS EX) 750 MG chewable tablet Chew 2 tablets by mouth daily as needed for heartburn.      . diphenhydrAMINE (BENADRYL) 25 MG tablet Take 1 tablet (25 mg total) by mouth every 6 (six) hours.  20 tablet  0  . docusate sodium (COLACE) 100 MG capsule Take 1 capsule (100 mg total) by mouth daily.  30 capsule  3  . folic acid (FOLVITE) 1 MG tablet Take 1 tablet (1 mg total) by mouth daily.  30 tablet  3  . hydrOXYzine (ATARAX/VISTARIL) 25 MG tablet Take 1 tablet (25 mg total) by mouth every 6 (six) hours.  30 tablet  0  . latanoprost (XALATAN) 0.005 % ophthalmic solution Place 1 drop into both eyes at bedtime. Affected eye  2.5 mL  12  . metoprolol (LOPRESSOR) 50 MG tablet Take 1 tablet (50 mg total) by mouth 2 (two) times daily.  60 tablet  1  . prazosin (MINIPRESS) 2 MG capsule Take 2 capsules (4 mg total) by mouth at bedtime.  30 capsule  3  . traZODone (DESYREL) 25 mg TABS tablet Take 0.5 tablets (25 mg total) by mouth at bedtime.  30 tablet  1    Allergies: Allergies as of 12/11/2013  . (No Known Allergies)   Past Medical History  Diagnosis  Date  . COPD (chronic obstructive pulmonary disease)   . Hypertension   . Hep C w/ coma, chronic   . Irregular heartbeat   . ESRD (end stage renal disease) on dialysis     /notes 11/11/2013  . Smoker unmotivated to quit   . Active smoker   . PTSD (post-traumatic stress disorder)    Past Surgical History  Procedure Laterality Date  . Paracentesis  ~ 10/2013    [name] 11/11/2013   No family history on file. History   Social History  . Marital Status: Single    Spouse Name: N/A    Number of Children: N/A  . Years of Education: N/A   Occupational History  . Not on file.   Social History Main Topics  . Smoking status: Current Every Day Smoker -- 1.50 packs/day for 15 years    Types: Cigarettes  . Smokeless tobacco: Not on file  . Alcohol Use: No  . Drug Use: Yes    Special: "Crack" cocaine, Cocaine, Marijuana, Other-see comments     Comment: states he longer uses, but has long history of polysubstance  . Sexual Activity: Not Currently   Other Topics Concern  . Not on file   Social History Narrative  . No narrative on file    Review of Systems: CONSTITUTIONAL- No Fever. SKIN- No Rash, colour changes, itching. HEAD- No Headache,or  dizziness. RESPIRATORY- Cough- present, chronic, non productive, no SOB. CARDIAC- No Palpitations, DOE, PND, or chest pain. GI- No nausea, vomiting, diarrhoea, constipation, abd pain, no jaundice. URINARY- Anuric NEUROLOGIC- No Numbness  Physical Exam: Blood pressure 141/105, pulse 92, temperature 97.9 F (36.6 C), temperature source Oral, resp. rate 18, SpO2 99.00%. GENERAL- Chronically ill-appearing male, alert, co-operative, appears as stated age,lying in bed, not in any distress. HEENT- Atraumatic, normocephalic, PERRL, EOMI, oral mucosa appears moist,  CARDIAC- Regular rate and rhythm, no murmurs, rubs or gallops. RESP- Crackles present bilat lung bases, rhonchorus breath sounds- generalised, Moving equal volumes of air,  ABDOMEN-  tense, distended, nontender, abdominal organs not paplapble due to distension, bowel sounds present. GROIN- Swelling of penis and scrotum , no ulcer or lesions. BACK- Normal curvature of the spine, No tenderness along the vertebrae. NEURO- No obvious Cr n abnormality, strenght equal and present in all extremities. Extrem- +2 pitting pedal edema- lower extremities. SKIN- Warm, dry, No rash or lesion. PSYCH- Normal mood and affect, appropriate thought content and speech.  Lab results: Basic Metabolic Panel:  Recent Labs  16/10/96 0630 12/12/13 0103  NA 137 133*  K 4.2 5.9*  CL 90* 89*  CO2 25 17*  GLUCOSE 106* 103*  BUN 37* 70*  CREATININE 5.69* 8.33*  CALCIUM 9.3 8.5   Liver Function Tests:  Recent Labs  12/09/13 0630 12/12/13 0103  AST 122* 124*  ALT 63* 65*  ALKPHOS 229* 204*  BILITOT 2.2* 1.9*  PROT 7.7 7.0  ALBUMIN 2.8* 2.5*    Recent Labs  12/09/13 0630  LIPASE 137*   No results found for this basename: AMMONIA,  in the last 72 hours CBC:  Recent Labs  12/09/13 0630 12/12/13 0103  WBC 9.0 8.3  NEUTROABS  --  6.0  HGB 11.6* 11.1*  HCT 33.3* 30.5*  MCV 77.6* 75.7*  PLT 116* 110*   Cardiac Enzymes:  Recent Labs  12/09/13 0630 12/09/13 1042  TROPONINI <0.30 <0.30   BNP: No results found for this basename: PROBNP,  in the last 72 hours D-Dimer: No results found for this basename: DDIMER,  in the last 72 hours CBG:  Recent Labs  12/12/13 0221  GLUCAP 111*   Hemoglobin A1C: No results found for this basename: HGBA1C,  in the last 72 hours Fasting Lipid Panel: No results found for this basename: CHOL, HDL, LDLCALC, TRIG, CHOLHDL, LDLDIRECT,  in the last 72 hours Thyroid Function Tests: No results found for this basename: TSH, T4TOTAL, FREET4, T3FREE, THYROIDAB,  in the last 72 hours Anemia Panel: No results found for this basename: VITAMINB12, FOLATE, FERRITIN, TIBC, IRON, RETICCTPCT,  in the last 72 hours Coagulation:  Recent Labs   12/09/13 0630  LABPROT 14.8  INR 1.19   Urine Drug Screen: Drugs of Abuse  No results found for this basename: labopia,  cocainscrnur,  labbenz,  amphetmu,  thcu,  labbarb    Alcohol Level: No results found for this basename: ETH,  in the last 72 hours Urinalysis: No results found for this basename: COLORURINE, APPERANCEUR, LABSPEC, PHURINE, GLUCOSEU, HGBUR, BILIRUBINUR, KETONESUR, PROTEINUR, UROBILINOGEN, NITRITE, LEUKOCYTESUR,  in the last 72 hours Misc. Labs:  Imaging results:  No results found.  Other results: EKG: Rate- 92 bpm, regular, sinus rhythm, Qtc- 485, atrial enlargement, left axis deviation, old wave in versions- v4, v5, v6.   Assessment & Plan by Problem: Principal Problem:   Hyperkalemia Active Problems:   ESRD on hemodialysis   Ascites   HTN (hypertension)  Ascites-  Likely etiology fluid overload, in a pt with ESRD on HD, from missed dialysis sessions, with fluid retention. Last HD session ?thursday. Also consider liver pathology as pt has a hx of liver cirrhosis 2/2 chronic hep C, chronically ill appearing, without typical stimata, but with ascites and peripheral edema. LFTs- today, elevated- AST- 124, ALT- 65, ALP- 204, with Alb- red at 2.5. Last paracentesis- 1/30- Albumin-1.3,  But no documented Serum levels of same test done on the same day, to determine the SAAG- serum to ascites albumin gradient. Using todays albumin level however- SAAG- 1.2, supporting a portal HTN from cirrhosis as a possible cause of the ascites. No suspicion of SBP in this patient with Cirrhosis and ascitis, as he is afebrile, and absence of altered mental status. - Nephrology consult. - PT- INR, APTT - Abd Uss- access portal vein.  HyperKalemia- K- 5.9, EKG- without EKG changes.  - Nephrology consult- awaiting HD.  Microcytic Anemia- Hgb today- 11.1, with reduced MCV and increased RDW, suggestive of iron deficiency anemia, also Likely chronic, in the setting of ESRD.  - Iron  studies.  COPD- Signif smoking hx, rhonchorus breath sounds bilat. No acute  exacebations at this time. - Duonebs- Q6H PRN.  End Stage Renal Disease on HD - on TTsat schedule, at [facility].  -Daily wt  - Phosphorus level  - Continue home med- Phoslo  - Nephrology consulted.   Paroxysmal Atrial Fibrillation - currently in normal sinus.rhythm. Pt home medications metoprolol- 50mg  BID, not on anticoagulant.  -Cont metoprolol 50 mg BID  -Continue 81 mg aspirin daily   Hypertension - Mildly elevated since admission- Sys- 140, elevted diastolic- 100s . At home on amlodipine 10mg  daily and metoprolol.  - Continue home meds.  Chronic liver cirrhosis with ascites - Most likely due to chronic Hepatitis C virus. Last Diagnostic and therapeutic (1.2L) paracentesis performed on 1/30 , awaiting final culture results. - Might require paracentesis if ascites fails to resolve with HD, as pt is anuric. But increased risk for infection with SBP with repeated paracentesis.   Thrombocytopenia - stable, 110 today. With no active bleeding or bruising. Most likely due to chronic liver cirrhosis.  -Continue to monitor CBC   Substance Abuse - Pt with history of cocaine use. Also cigarette smoker (1 pack 50 yrs).  -Encourage drug cessation    Dispo: Disposition is deferred at this time, awaiting improvement of current medical problems.   The patient does not know have a current PCP (No Pcp Per Patient) and does not know need an [facility] hospital follow-up appointment after discharge.  The patient does not know have transportation limitations that hinder transportation to clinic appointments.  Signed: Kennis Carina, MD 12/12/2013, 4:42 AM

## 2013-12-13 NOTE — Discharge Summary (Signed)
**Patient left AMA**   Name: Carl Benson MRN: [id number] DOB: [birth year] 58 y.o. PCP: No Pcp Per Patient  Date of Admission: 12/11/2013 10:39 PM Date of Discharge: 12/13/2013 Attending Physician: No att. providers found  Discharge Diagnosis: Principal Problem:   Hyperkalemia Active Problems:   ESRD on hemodialysis   Ascites   HTN (hypertension)   Volume overload   Discharge Medications:   Medication List    ASK your doctor about these medications       albuterol 108 (90 BASE) MCG/ACT inhaler  Commonly known as:  PROVENTIL HFA;VENTOLIN HFA  Inhale 1-2 puffs into the lungs every 6 (six) hours as needed for wheezing or shortness of breath.     amLODipine 10 MG tablet  Commonly known as:  NORVASC  Take 1 tablet (10 mg total) by mouth daily.     aspirin 81 MG EC tablet  Take 1 tablet (81 mg total) by mouth daily.     b complex-vitamin c-folic acid 0.8 MG Tabs tablet  Take 1 tablet by mouth at bedtime.     calcium acetate 667 MG capsule  Commonly known as:  PHOSLO  Take 2 capsules (1,334 mg total) by mouth 3 (three) times daily with meals.     calcium carbonate 750 MG chewable tablet  Commonly known as:  TUMS EX  Chew 2 tablets by mouth daily as needed for heartburn.     diphenhydrAMINE 25 MG tablet  Commonly known as:  BENADRYL  Take 1 tablet (25 mg total) by mouth every 6 (six) hours.     docusate sodium 100 MG capsule  Commonly known as:  COLACE  Take 1 capsule (100 mg total) by mouth daily.     folic acid 1 MG tablet  Commonly known as:  FOLVITE  Take 1 tablet (1 mg total) by mouth daily.     hydrOXYzine 25 MG tablet  Commonly known as:  ATARAX/VISTARIL  Take 1 tablet (25 mg total) by mouth every 6 (six) hours.     latanoprost 0.005 % ophthalmic solution  Commonly known as:  XALATAN  Place 1 drop into both eyes at bedtime. Affected eye     metoprolol 50 MG tablet  Commonly known as:  LOPRESSOR  Take 1 tablet (50 mg  total) by mouth 2 (two) times daily.     prazosin 2 MG capsule  Commonly known as:  MINIPRESS  Take 2 capsules (4 mg total) by mouth at bedtime.     traZODone 25 mg Tabs tablet  Commonly known as:  DESYREL  Take 0.5 tablets (25 mg total) by mouth at bedtime.        Disposition and follow-up:   Carl Benson left AMA from [facility]. At the hospital follow up visit please address:  1.  His compliance with hemodialysis and his  medicaitons  2.  Labs / imaging needed at time of follow-up: His anemia and thrombocytopenia will need to be monitored as an outpatient   3.  Pending labs/ test needing follow-up: None  Follow-up Appointments:          **Pt left AMA before any appointments could be scheduled for him**  Consultations:  Nephrology  Procedures Performed:  X-ray Chest Pa And Lateral   12/12/2013   CLINICAL  DATA:  Shortness of breath and constipation  EXAM: CHEST  2 VIEW  COMPARISON:  12/09/2013  FINDINGS: Streaky lower lung opacities, especially at the left base. The could be a trace left pleural effusion. Mild chronic cardiomegaly. No acute osseous findings.  IMPRESSION: Lower lung atelectasis.  No edema or consolidation.   Electronically Signed   By: Tiburcio Pea M.D.   On: 12/12/2013 05:52   Dg Chest 2 View  12/09/2013   CLINICAL DATA:  Chest and abdomen pain.  COPD.  EXAM: CHEST  2 VIEW  COMPARISON:  11/27/2013 and 11/11/2013  FINDINGS: Persistent prominent cardiomegaly and pulmonary vascular congestion. Increased small bilateral pleural effusions.  IMPRESSION: Increased small effusions. Persistent cardiomegaly and pulmonary vascular congestion.   Electronically Signed   By: Geanie Cooley M.D.   On: 12/09/2013 07:05   Dg Chest 2 View  11/27/2013   CLINICAL DATA:  Chest pain.  History of smoking.  EXAM: CHEST  2 VIEW  COMPARISON:  Chest radiograph performed 09/19/2014  FINDINGS: The lungs are well-aerated and clear. There is no evidence of focal  opacification, pleural effusion or pneumothorax.  The heart is enlarged. Pulmonary vascularity is at the upper limits of normal. No acute osseous abnormalities are seen.  IMPRESSION: No acute cardiopulmonary process seen; cardiomegaly noted.   Electronically Signed   By: Roanna Raider M.D.   On: 11/27/2013 06:34   US Paracentesis  12/09/2013   CLINICAL DATA:  Ascites  EXAM: ULTRASOUND GUIDED PARACENTESIS  TECHNIQUE: Survey ultrasound of the abdomen was performed and an appropriate skin entry site in the LLQ abdomen was selected. Skin site was marked, prepped with Betadine, and draped in usual sterile fashion, and infiltrated locally with 1% lidocaine. A 5 French multisidehole Yueh sheath needle was advanced into the peritoneal space until fluid could be aspirated. The sheath was advanced and the needle removed. 1.2 liters of yellow ascites were aspirated. No immediate complication.  IMPRESSION: Technically successful ultrasound guided paracentesis, removing 1.2 liters of ascites.  Read By:  Pattricia Boss PA-C   Electronically Signed   By: Irish Lack M.D.   On: 12/09/2013 15:57   Dg Abd Acute W/chest  11/20/2013   CLINICAL DATA:  Abdominal pain  EXAM: ACUTE ABDOMEN SERIES (ABDOMEN 2 VIEW & CHEST 1 VIEW)  COMPARISON:  None.  FINDINGS: Cardiac shadow is enlarged.  The lungs are clear bilaterally.  Scattered large and small bowel gas is seen. The stomach is distended with air. No free air is noted. No abnormal mass or abnormal calcifications are noted. No bony abnormality is seen.  IMPRESSION: No acute abnormality noted   Electronically Signed   By: Alcide Clever M.D.   On: 11/20/2013 20:07   Dg Foot 2 Views Right  11/27/2013   CLINICAL DATA:  Right heel pain  EXAM: RIGHT FOOT - 2 VIEW  COMPARISON:  None available  FINDINGS: No acute fracture or dislocation. Joint spaces are maintained. Small corticated osseous density noted posterior to the calcaneus, chronic in appearance. No osseous erosions. No joint  effusion. Vascular calcifications are noted.  IMPRESSION: No acute osseous abnormality about the foot.   Electronically Signed   By: Rise Mu M.D.   On: 11/27/2013 06:30    Admission:  History of Present Illness: Carl Benson is a 58 y o male with PMH of HTN, COPD, ESRD on HD- TTsat, Chronic hep C with Cirrhosis, depression, paroxysmal A fib. Pt presented today with complaints of abdominal aswelling present for the past  2days, recurrent, says it reccurs after his HD sessions and before his next one. No vomiting, last BM was 2 days ago. Patient also endorses groin swelling also reoccurring after dialysis and before his next one. Leg swelling is present and chronic. Last dialysis was probably on Thursday- pt thinks. He dosent know his dry weight and says during HD not enough fluid is pulled off and sometimes too much fluid is pulled off. Pt says he recently moved here from [place]- 1st of January. Patinet can sleep flat, no chest pain. Endorses cough present for ocver 6 months, non productive. Smokes- 1 pack /day since he was 58 years old. Denies any alcohol intake. Pt has had several admissions this month for same complaints- 23, 25, 27, 30th for- abdominal pain and ascites. Pt gets dialysized at [facility]. Ha sbeen on HD for the past 1 year, and is anuric. As per chat review, pt had ultrasound guided paracentesis, on 11/11/2013, and again- 12/09/2013 -1.2 liters of ascitic fluid drained. 11/11/2013- Final results, No organisms seen and no growth of any orgs. Results of 1/30- Final results- Pending.  Review of Systems:  CONSTITUTIONAL- No Fever.  SKIN- No Rash, colour changes, itching.  HEAD- No Headache,or dizziness.  RESPIRATORY- Cough- present, chronic, non productive, no SOB.  CARDIAC- No Palpitations, DOE, PND, or chest pain.  GI- No nausea, vomiting, diarrhoea, constipation, abd pain, no jaundice.  URINARY- Anuric  NEUROLOGIC- No Numbness   Physical Exam:  Blood pressure 141/105,  pulse 92, temperature 97.9 F (36.6 C), temperature source Oral, resp. rate 18, SpO2 99.00%.  GENERAL- Chronically ill-appearing male, alert, co-operative, appears as stated age,lying in bed, not in any distress.  HEENT- Atraumatic, normocephalic, PERRL, EOMI, oral mucosa appears moist,  CARDIAC- Regular rate and rhythm, no murmurs, rubs or gallops.  RESP- Crackles present bilat lung bases, rhonchorus breath sounds- generalised, Moving equal volumes of air,  ABDOMEN- tense, distended, nontender, abdominal organs not paplapble due to distension, bowel sounds present.  GROIN- Swelling of penis and scrotum , no ulcer or lesions.  BACK- Normal curvature of the spine, No tenderness along the vertebrae.  NEURO- No obvious Cr n abnormality, strenght equal and present in all extremities.  Extrem- +2 pitting pedal edema- lower extremities.  SKIN- Warm, dry, No rash or lesion.  PSYCH- Normal mood and affect, appropriate thought content and speech.   Hospital Course by problem list:  Volume overload in the setting of ESRD:  He presented to the ED with fluid overload with ascites causing increased dyspnea, orthopnea, and nausea, likely 2/2 to missing HD on 1/31. Pt with a h/o ESRD on HD TThSa at [facility] with reportedly poor compliance. He also has a h/o Hepatitis C with cirrhosis requiring therapeutic paracenteses, most recently 11/29/13 where 1.2L of fluid were removed. LFTs elevated on admission, concerning for possible portal vein thrombosis, which could have contributed to his volume overload. An abdominal u/s was ordered to assess his portal vein, but the pt left AMA before the imaging could be performed. Spontaneous bacterial peritonitis was thought to be unlikely as the patient was asymptomatic. Nephrology was consulted as well, and the patient was dialyzed on 2/2. He decided to leave the hospital against medical advice after his dialysis.    Hyperkalemia: Pt presents with potassium of 5.9 on  admission (w/o EKG changes), which corrected to 4.9 prior to him leaving AMA.   HTN: Pt on CCB and BB at home. BPs mildly elevated on admission. Home Amlodipine 10mg  daily and  lopressor 50mg  BID were continued on admission.   Microcytic anemia: Hgb 11.1 with low MCV and increased RDW. Likely chronic, esp given his ESRD.  Paroxysmal atrial fibrillation: Pt in NSR this admission. His home metoprolol 50mg  BID and an ASA 81mg  were continued on admission.   COPD: Pt with a long smoking history. W/o acute exacerbation. Duonebs were started on admission q6h PRN.  Hepatitis C with cirrhiosis: Pt with ascites, requiring therapeutic paracenteses, pmost recently on 1/30 where 1.2L of ascites was removed, cultures negative.   Thrombocytopenia: Stable, 110 on admission. No active bleeding or bruising. Most likely due to chronic liver cirrhosis.    Discharge Vitals:   BP 157/89  Pulse 83  Temp(Src) 97.4 F (36.3 C) (Oral)  Resp 17  Ht 5\' 5"  (1.651 m)  Wt 139 lb 8.8 oz (63.3 kg)  BMI 23.22 kg/m2  SpO2 96%  Discharge Labs:  No results found for this or any previous visit (from the past 24 hour(s)).  Signed: Genelle Gather, MD Internal Medicine Resident, PGY II [facility] Internal Medicine Program Pager: [phone] 12/14/2013 1:56 PM    Time Spent on Discharge: 35 minutes Services Ordered on Discharge: None Equipment Ordered on Discharge: None

## 2013-12-19 ENCOUNTER — Emergency Department (HOSPITAL_COMMUNITY): Payer: Medicare Other

## 2013-12-19 ENCOUNTER — Emergency Department (HOSPITAL_COMMUNITY)
Admission: EM | Admit: 2013-12-19 | Discharge: 2013-12-19 | Disposition: A | Discharge status: Home / Self Care | Payer: Medicare Other | Source: Home / Self Care | Attending: Emergency Medicine | Admitting: Emergency Medicine

## 2013-12-19 ENCOUNTER — Encounter (HOSPITAL_COMMUNITY): Payer: Self-pay | Admitting: Emergency Medicine

## 2013-12-19 DIAGNOSIS — R142 Eructation: Secondary | ICD-10-CM | POA: Insufficient documentation

## 2013-12-19 DIAGNOSIS — R143 Flatulence: Secondary | ICD-10-CM

## 2013-12-19 DIAGNOSIS — R141 Gas pain: Secondary | ICD-10-CM | POA: Insufficient documentation

## 2013-12-19 DIAGNOSIS — R609 Edema, unspecified: Secondary | ICD-10-CM | POA: Insufficient documentation

## 2013-12-19 DIAGNOSIS — R188 Other ascites: Secondary | ICD-10-CM

## 2013-12-19 DIAGNOSIS — F17200 Nicotine dependence, unspecified, uncomplicated: Secondary | ICD-10-CM | POA: Insufficient documentation

## 2013-12-19 DIAGNOSIS — Z7982 Long term (current) use of aspirin: Secondary | ICD-10-CM | POA: Insufficient documentation

## 2013-12-19 DIAGNOSIS — Z79899 Other long term (current) drug therapy: Secondary | ICD-10-CM | POA: Insufficient documentation

## 2013-12-19 DIAGNOSIS — Z8719 Personal history of other diseases of the digestive system: Secondary | ICD-10-CM

## 2013-12-19 DIAGNOSIS — Z8619 Personal history of other infectious and parasitic diseases: Secondary | ICD-10-CM | POA: Insufficient documentation

## 2013-12-19 DIAGNOSIS — J441 Chronic obstructive pulmonary disease with (acute) exacerbation: Secondary | ICD-10-CM | POA: Insufficient documentation

## 2013-12-19 DIAGNOSIS — I120 Hypertensive chronic kidney disease with stage 5 chronic kidney disease or end stage renal disease: Secondary | ICD-10-CM | POA: Insufficient documentation

## 2013-12-19 DIAGNOSIS — F4310 Post-traumatic stress disorder, unspecified: Secondary | ICD-10-CM

## 2013-12-19 DIAGNOSIS — R1084 Generalized abdominal pain: Secondary | ICD-10-CM

## 2013-12-19 DIAGNOSIS — N186 End stage renal disease: Secondary | ICD-10-CM | POA: Insufficient documentation

## 2013-12-19 DIAGNOSIS — Z992 Dependence on renal dialysis: Secondary | ICD-10-CM | POA: Insufficient documentation

## 2013-12-19 LAB — POCT I-STAT, CHEM 8
BUN: 34 mg/dL — ABNORMAL HIGH (ref 6–23)
CHLORIDE: 95 meq/L — AB (ref 96–112)
CREATININE: 6.6 mg/dL — AB (ref 0.50–1.35)
Calcium, Ion: 1.02 mmol/L — ABNORMAL LOW (ref 1.12–1.23)
GLUCOSE: 157 mg/dL — AB (ref 70–99)
HCT: 40 % (ref 39.0–52.0)
HEMOGLOBIN: 13.6 g/dL (ref 13.0–17.0)
POTASSIUM: 3.5 meq/L — AB (ref 3.7–5.3)
SODIUM: 138 meq/L (ref 137–147)
TCO2: 29 mmol/L (ref 0–100)

## 2013-12-19 MED ORDER — HYDROXYZINE HCL 25 MG PO TABS
50.0000 mg | ORAL_TABLET | Freq: Once | ORAL | Status: AC
  Administered 2013-12-19: 50 mg via ORAL
  Filled 2013-12-19: qty 2

## 2013-12-19 MED ORDER — DIPHENHYDRAMINE HCL 25 MG PO CAPS
50.0000 mg | ORAL_CAPSULE | Freq: Once | ORAL | Status: AC
  Administered 2013-12-19: 50 mg via ORAL
  Filled 2013-12-19: qty 2

## 2013-12-19 MED ORDER — OXYCODONE-ACETAMINOPHEN 5-325 MG PO TABS
1.0000 | ORAL_TABLET | Freq: Once | ORAL | Status: AC
  Administered 2013-12-19: 1 via ORAL
  Filled 2013-12-19: qty 1

## 2013-12-19 NOTE — ED Notes (Signed)
Hx of renal/liver disease, presents with ascites and sob, per EMS patient has frequent visits requiring dialysis or paracentesis, EMS states patient is noncompliant with medications and problems with dialysis, patient states he doesn't eat much and cant take his meds and he cant afford them as well

## 2013-12-19 NOTE — ED Notes (Addendum)
Anitra Lauth, MD remains at bedside.

## 2013-12-19 NOTE — ED Provider Notes (Addendum)
CSN: [id number]     Arrival date & time 12/19/13  1439 History   First MD Initiated Contact with Patient 12/19/13 1524     Chief Complaint  Patient presents with  . Shortness of Breath    ascites     (Consider location/radiation/quality/duration/timing/severity/associated sxs/prior Treatment) HPI Comments: Patient has a history of end-stage renal disease on dialysis and cirrhosis with ascites. He states that he feels like his abdomen is too distended and needs fluid taken off. His last paracentesis was 6 days ago. He last had dialysis on Saturday and his next scheduled dialysis is tomorrow. He states he is always short of breath but it's about the same as it normally is. He denies chest pain but has noted some increased swelling in his lower extremities.  Patient states his abdomen is slightly tender because it stretched tight. He states he is not eating right now because he cannot afford food intolerance Monday when he gets his check.  He denies fever and has been having regular soft bowel movements daily. He produces no urine.  The history is provided by the patient.    Past Medical History  Diagnosis Date  . COPD (chronic obstructive pulmonary disease)   . Hypertension   . Hep C w/ coma, chronic   . Irregular heartbeat   . ESRD (end stage renal disease) on dialysis     /notes 11/11/2013  . Smoker unmotivated to quit   . Active smoker   . PTSD (post-traumatic stress disorder)    Past Surgical History  Procedure Laterality Date  . Paracentesis  ~ 10/2013    [name] 11/11/2013   No family history on file. History  Substance Use Topics  . Smoking status: Current Every Day Smoker -- 1.50 packs/day for 15 years    Types: Cigarettes  . Smokeless tobacco: Not on file  . Alcohol Use: No    Review of Systems  All other systems reviewed and are negative.      Allergies  Review of patient's allergies indicates no known allergies.  Home Medications   Current Outpatient Rx  Name   Route  Sig  Dispense  Refill  . albuterol (PROVENTIL HFA;VENTOLIN HFA) 108 (90 BASE) MCG/ACT inhaler   Inhalation   Inhale 1-2 puffs into the lungs every 6 (six) hours as needed for wheezing or shortness of breath.         Marland Kitchen amLODipine (NORVASC) 10 MG tablet   Oral   Take 1 tablet (10 mg total) by mouth daily.   30 tablet   1   . aspirin 81 MG EC tablet   Oral   Take 1 tablet (81 mg total) by mouth daily.   30 tablet   3   . b complex-vitamin c-folic acid (NEPHRO-VITE) 0.8 MG TABS tablet   Oral   Take 1 tablet by mouth at bedtime.   30 tablet   3   . calcium acetate (PHOSLO) 667 MG capsule   Oral   Take 2 capsules (1,334 mg total) by mouth 3 (three) times daily with meals.   90 capsule   3   . calcium carbonate (TUMS EX) 750 MG chewable tablet   Oral   Chew 2 tablets by mouth daily as needed for heartburn.         . diphenhydrAMINE (BENADRYL) 25 MG tablet   Oral   Take 1 tablet (25 mg total) by mouth every 6 (six) hours.   20 tablet   0   .  docusate sodium (COLACE) 100 MG capsule   Oral   Take 1 capsule (100 mg total) by mouth daily.   30 capsule   3   . folic acid (FOLVITE) 1 MG tablet   Oral   Take 1 tablet (1 mg total) by mouth daily.   30 tablet   3   . hydrOXYzine (ATARAX/VISTARIL) 25 MG tablet   Oral   Take 1 tablet (25 mg total) by mouth every 6 (six) hours.   30 tablet   0   . latanoprost (XALATAN) 0.005 % ophthalmic solution   Both Eyes   Place 1 drop into both eyes at bedtime. Affected eye   2.5 mL   12   . metoprolol (LOPRESSOR) 50 MG tablet   Oral   Take 1 tablet (50 mg total) by mouth 2 (two) times daily.   60 tablet   1   . prazosin (MINIPRESS) 2 MG capsule   Oral   Take 2 capsules (4 mg total) by mouth at bedtime.   30 capsule   3   . traZODone (DESYREL) 25 mg TABS tablet   Oral   Take 0.5 tablets (25 mg total) by mouth at bedtime.   30 tablet   1    BP 152/108  Pulse 84  Temp(Src) 98.1 F (36.7 C) (Oral)  Resp  24  Ht 5\' 5"  (1.651 m)  Wt 140 lb (63.504 kg)  BMI 23.30 kg/m2  SpO2 99% Physical Exam  Nursing note and vitals reviewed. Constitutional: He is oriented to person, place, and time. He appears well-developed. He appears cachectic. No distress.  HENT:  Head: Normocephalic and atraumatic.  Mouth/Throat: Oropharynx is clear and moist.  Eyes: Conjunctivae and EOM are normal. Pupils are equal, round, and reactive to light.  Neck: Normal range of motion. Neck supple.  Cardiovascular: Normal rate, regular rhythm and intact distal pulses.   No murmur heard. Pulmonary/Chest: Effort normal and breath sounds normal. No respiratory distress. He has no wheezes. He has no rales.  Abdominal: Soft. He exhibits distension and ascites. There is generalized tenderness. There is no rebound and no guarding.  Mild generalized tenderness  Musculoskeletal: Normal range of motion. He exhibits edema. He exhibits no tenderness.  1+ edema bilaterally  Neurological: He is alert and oriented to person, place, and time.  Skin: Skin is warm and dry. No rash noted. No erythema.  Psychiatric: He has a normal mood and affect. His behavior is normal.    ED Course  PARACENTESIS Date/Time: 12/19/2013 7:31 PM Performed by: Gwyneth Sprout Authorized by: Gwyneth Sprout Consent: Verbal consent obtained. written consent obtained. Risks and benefits: risks, benefits and alternatives were discussed Consent given by: patient Patient understanding: patient states understanding of the procedure being performed Patient consent: the patient's understanding of the procedure matches consent given Procedure consent: procedure consent matches procedure scheduled Relevant documents: relevant documents present and verified Site marked: the operative site was marked Imaging studies: imaging studies available Patient identity confirmed: verbally with patient Time out: Immediately prior to procedure a "time out" was called to  verify the correct patient, procedure, equipment, support staff and site/side marked as required. Initial or subsequent exam: initial Procedure purpose: therapeutic Indications: abdominal discomfort secondary to ascites Anesthesia: local infiltration Local anesthetic: lidocaine 1% without epinephrine Anesthetic total: 3 ml Patient sedated: no Preparation: Patient was prepped and draped in the usual sterile fashion. Needle gauge: 18 Ultrasound guidance: yes Puncture site: right lower quadrant Fluid removed: 1000(ml) Fluid appearance: clear  and serous Dressing: 4x4 sterile gauze Patient tolerance: Patient tolerated the procedure well with no immediate complications.   (including critical care time) Labs Review Labs Reviewed - No data to display Imaging Review [facility]  12/19/2013   CLINICAL DATA:  Shortness of breath  EXAM: PORTABLE CHEST - 1 VIEW  COMPARISON:  December 12, 2013  FINDINGS: There is no edema or consolidation. Heart is enlarged with normal pulmonary vascularity. No adenopathy. No bone lesions.  IMPRESSION: Cardiomegaly.  No edema or consolidation.   Electronically Signed   By: Bretta BangWilliam  Woodruff M.D.   On: 12/19/2013 16:07    EKG Interpretation   None       Date: 12/19/2013  Rate: 83  Rhythm: normal sinus rhythm  QRS Axis: normal  Intervals: QT prolonged  ST/T Wave abnormalities: nonspecific ST/T changes  Conduction Disutrbances:lvh  Narrative Interpretation:   Old EKG Reviewed: unchanged    MDM   Final diagnoses:  Ascites    Patient with multiple medical problems including end-stage renal disease cirrhosis and ascites who presents today because he states they're still much fluid on his abdomen and he needed to take it off. His last dialysis was on Saturday and his next one is tomorrow. He states his breathing is about the same as it always is and he appears in no acute distress. The patient states he has not eaten in 2 days because he did not get  his check ~Monday and he can afford to buy anything to eat. Patient has mild diffuse tenderness over his abdomen with presence of ascites however bedside ultrasound shows no large pockets of fluid were paracentesis can be done. His lungs are clear. He denies any infectious etiology and is having bowel movements daily. Patient's shows no signs of encephalopathy.  Will ensure normal K and CXR is clear and feel pt can be safely d/ced home for dialysis tomorrow.  7:30 PM Repeat u/s showed fluid pocket and paracentesis done with drainage of 1L of clear straw ascitic fluid.  Pt ready for d/c  Gwyneth SproutWhitney Janice Seales, MD 12/19/13 1932  Gwyneth SproutWhitney Ramiyah Mcclenahan, MD 12/19/13 [id number]

## 2013-12-19 NOTE — Discharge Instructions (Signed)
Ascites °Ascites is a gathering of fluid in the belly (abdomen). This is most often caused by liver disease. It may also be caused by a number of other less common problems. It causes a ballooning out (distension) of the abdomen. °CAUSES  °Scarring of the liver (cirrhosis) is the most common cause of ascites. Other causes include: °· Infection or inflammation in the abdomen. °· Cancer in the abdomen. °· Heart failure. °· Certain forms of kidney failure (nephritic syndrome). °· Inflammation of the pancreas. °· Clots in the veins of the liver. °SYMPTOMS  °In the early stages of ascites, you may not have any symptoms. The main symptom of ascites is a sense of abdominal bloating. This is due to the presence of fluid. This may also cause an increase in abdominal or waist size. People with this condition can develop swelling in the legs, and men can develop a swollen scrotum. When there is a lot of fluid, it may be hard to breath. Stretching of the abdomen by fluid can be painful. °DIAGNOSIS  °Certain features of your medical history, such as a history of liver disease and of an enlarging abdomen, can suggest the presence of ascites. The diagnosis of ascites can be made on physical exam by your caregiver. An abdominal ultrasound examination can confirm that ascites is present, and estimate the amount of fluid. °Once ascites is confirmed, it is important to determine its cause. Again, a history of one of the conditions listed in "CAUSES" provides a strong clue. A physical exam is important, and blood and X-ray tests may be needed. During a procedure called paracentesis, a sample of fluid is removed from the abdomen. This can determine certain key features about the fluid, such as whether or not infection or cancer is present. Your caregiver will determine if a paracentesis is necessary. They will describe the procedure to you. °PREVENTION  °Ascites is a complication of other conditions. Therefore to prevent ascites, you  must seek treatment for any significant health conditions you have. Once ascites is present, careful attention to fluid and salt intake may help prevent it from getting worse. If you have ascites, you should not drink alcohol. °PROGNOSIS  °The prognosis of ascites depends on the underlying disease. If the disease is reversible, such as with certain infections or with heart failure, then ascites may improve or disappear. When ascites is caused by cirrhosis, then it indicates that the liver disease has worsened, and further evaluation and treatment of the liver disease is needed. If your ascites is caused by cancer, then the success or failure of the cancer treatment will determine whether your ascites will improve or worsen. °RISKS AND COMPLICATIONS  °Ascites is likely to worsen if it is not properly diagnosed and treated. A large amount of ascites can cause pain and difficulty breathing. The main complication, besides worsening, is infection (called spontaneous bacterial peritonitis). This requires prompt treatment. °TREATMENT  °The treatment of ascites depends on its cause. When liver disease is your cause, medical management using water pills (diuretics) and decreasing salt intake is often effective. Ascites due to peritoneal inflammation or malignancy (cancer) alone does not respond to salt restriction and diuretics. Hospitalization is sometimes required. °If the treatment of ascites cannot be managed with medications, a number of other treatments are available. Your caregivers will help you decide which will work best for you. Some of these are: °· Removal of fluid from the abdomen (paracentesis). °· Fluid from the abdomen is passed into a vein (peritoneovenous shunting). °·   Liver transplantation. °· Transjugular intrahepatic portosystemic stent shunt. °HOME CARE INSTRUCTIONS  °It is important to monitor body weight and the intake and output of fluids. Weigh yourself at the same time every day. Record your  weights. Fluid restriction may be necessary. It is also important to know your salt intake. The more salt you take in, the more fluid you will retain. Ninety percent of people with ascites respond to this approach. °· Follow any directions for medicines carefully. °· Follow up with your caregiver, as directed. °· Report any changes in your health, especially any new or worsening symptoms. °· If your ascites is from liver disease, avoid alcohol and other substances toxic to the liver. °SEEK MEDICAL CARE IF:  °· Your weight increases more than a few pounds in a few days. °· Your abdominal or waist size increases. °· You develop swelling in your legs. °· You had swelling and it worsens. °SEEK IMMEDIATE MEDICAL CARE IF:  °· You develop a fever. °· You develop new abdominal pain. °· You develop difficulty breathing. °· You develop confusion. °· You have bleeding from the mouth, stomach, or rectum. °MAKE SURE YOU:  °· Understand these instructions. °· Will watch your condition. °· Will get help right away if you are not doing well or get worse. °Document Released: 10/27/2005 Document Revised: 01/19/2012 Document Reviewed: 05/28/2007 °ExitCare® Patient Information ©2014 [employer]. ° °

## 2013-12-19 NOTE — ED Notes (Addendum)
Nurse tech came out of room needing supplies for paracentesis. She informed this RN that Anitra Lauth, MD was at bedside and had already started the paracentesis.

## 2013-12-21 ENCOUNTER — Encounter (HOSPITAL_COMMUNITY): Payer: Self-pay | Admitting: Internal Medicine

## 2013-12-21 ENCOUNTER — Inpatient Hospital Stay (HOSPITAL_COMMUNITY)
Admission: EM | Admit: 2013-12-21 | Discharge: 2013-12-25 | DRG: 682 | Disposition: A | Discharge status: Home / Self Care | Payer: Medicare Other | Attending: Internal Medicine | Admitting: Internal Medicine

## 2013-12-21 ENCOUNTER — Emergency Department (HOSPITAL_COMMUNITY): Payer: Medicare Other

## 2013-12-21 DIAGNOSIS — B1920 Unspecified viral hepatitis C without hepatic coma: Secondary | ICD-10-CM | POA: Diagnosis present

## 2013-12-21 DIAGNOSIS — Z91158 Patient's noncompliance with renal dialysis for other reason: Secondary | ICD-10-CM

## 2013-12-21 DIAGNOSIS — Z5900 Homelessness unspecified: Secondary | ICD-10-CM

## 2013-12-21 DIAGNOSIS — E8770 Fluid overload, unspecified: Secondary | ICD-10-CM | POA: Diagnosis present

## 2013-12-21 DIAGNOSIS — Z638 Other specified problems related to primary support group: Secondary | ICD-10-CM

## 2013-12-21 DIAGNOSIS — D696 Thrombocytopenia, unspecified: Secondary | ICD-10-CM | POA: Diagnosis present

## 2013-12-21 DIAGNOSIS — F17200 Nicotine dependence, unspecified, uncomplicated: Secondary | ICD-10-CM | POA: Diagnosis present

## 2013-12-21 DIAGNOSIS — Z9115 Patient's noncompliance with renal dialysis: Secondary | ICD-10-CM

## 2013-12-21 DIAGNOSIS — Z833 Family history of diabetes mellitus: Secondary | ICD-10-CM

## 2013-12-21 DIAGNOSIS — I4891 Unspecified atrial fibrillation: Secondary | ICD-10-CM | POA: Diagnosis present

## 2013-12-21 DIAGNOSIS — I10 Essential (primary) hypertension: Secondary | ICD-10-CM | POA: Diagnosis present

## 2013-12-21 DIAGNOSIS — N186 End stage renal disease: Secondary | ICD-10-CM | POA: Diagnosis present

## 2013-12-21 DIAGNOSIS — R0902 Hypoxemia: Secondary | ICD-10-CM

## 2013-12-21 DIAGNOSIS — Z992 Dependence on renal dialysis: Secondary | ICD-10-CM

## 2013-12-21 DIAGNOSIS — R0602 Shortness of breath: Secondary | ICD-10-CM | POA: Diagnosis present

## 2013-12-21 DIAGNOSIS — K5900 Constipation, unspecified: Secondary | ICD-10-CM | POA: Diagnosis present

## 2013-12-21 DIAGNOSIS — N2581 Secondary hyperparathyroidism of renal origin: Secondary | ICD-10-CM | POA: Diagnosis present

## 2013-12-21 DIAGNOSIS — R142 Eructation: Secondary | ICD-10-CM

## 2013-12-21 DIAGNOSIS — B182 Chronic viral hepatitis C: Secondary | ICD-10-CM | POA: Diagnosis present

## 2013-12-21 DIAGNOSIS — I480 Paroxysmal atrial fibrillation: Secondary | ICD-10-CM | POA: Diagnosis present

## 2013-12-21 DIAGNOSIS — K7460 Unspecified cirrhosis of liver: Secondary | ICD-10-CM | POA: Diagnosis present

## 2013-12-21 DIAGNOSIS — N251 Nephrogenic diabetes insipidus: Secondary | ICD-10-CM | POA: Diagnosis present

## 2013-12-21 DIAGNOSIS — J4489 Other specified chronic obstructive pulmonary disease: Secondary | ICD-10-CM | POA: Diagnosis present

## 2013-12-21 DIAGNOSIS — R143 Flatulence: Secondary | ICD-10-CM

## 2013-12-21 DIAGNOSIS — E889 Metabolic disorder, unspecified: Secondary | ICD-10-CM | POA: Diagnosis present

## 2013-12-21 DIAGNOSIS — J449 Chronic obstructive pulmonary disease, unspecified: Secondary | ICD-10-CM | POA: Diagnosis present

## 2013-12-21 DIAGNOSIS — Z8249 Family history of ischemic heart disease and other diseases of the circulatory system: Secondary | ICD-10-CM

## 2013-12-21 DIAGNOSIS — J189 Pneumonia, unspecified organism: Secondary | ICD-10-CM | POA: Diagnosis present

## 2013-12-21 DIAGNOSIS — I120 Hypertensive chronic kidney disease with stage 5 chronic kidney disease or end stage renal disease: Principal | ICD-10-CM | POA: Diagnosis present

## 2013-12-21 DIAGNOSIS — R141 Gas pain: Secondary | ICD-10-CM

## 2013-12-21 DIAGNOSIS — D509 Iron deficiency anemia, unspecified: Secondary | ICD-10-CM | POA: Diagnosis present

## 2013-12-21 HISTORY — DX: Shortness of breath: R06.02

## 2013-12-21 LAB — CBC WITH DIFFERENTIAL/PLATELET
Basophils Absolute: 0 10*3/uL (ref 0.0–0.1)
Basophils Relative: 0 % (ref 0–1)
EOS ABS: 0.1 10*3/uL (ref 0.0–0.7)
Eosinophils Relative: 1 % (ref 0–5)
HCT: 31.6 % — ABNORMAL LOW (ref 39.0–52.0)
HEMOGLOBIN: 11 g/dL — AB (ref 13.0–17.0)
LYMPHS ABS: 2.5 10*3/uL (ref 0.7–4.0)
LYMPHS PCT: 27 % (ref 12–46)
MCH: 26.2 pg (ref 26.0–34.0)
MCHC: 34.8 g/dL (ref 30.0–36.0)
MCV: 75.2 fL — AB (ref 78.0–100.0)
MONOS PCT: 8 % (ref 3–12)
Monocytes Absolute: 0.7 10*3/uL (ref 0.1–1.0)
Neutro Abs: 5.7 10*3/uL (ref 1.7–7.7)
Neutrophils Relative %: 63 % (ref 43–77)
PLATELETS: 135 10*3/uL — AB (ref 150–400)
RBC: 4.2 MIL/uL — AB (ref 4.22–5.81)
RDW: 19.3 % — ABNORMAL HIGH (ref 11.5–15.5)
WBC: 9 10*3/uL (ref 4.0–10.5)

## 2013-12-21 LAB — POCT I-STAT TROPONIN I: TROPONIN I, POC: 0.08 ng/mL (ref 0.00–0.08)

## 2013-12-21 LAB — COMPREHENSIVE METABOLIC PANEL
ALK PHOS: 226 U/L — AB (ref 39–117)
ALT: 49 U/L (ref 0–53)
AST: 91 U/L — ABNORMAL HIGH (ref 0–37)
Albumin: 2.9 g/dL — ABNORMAL LOW (ref 3.5–5.2)
BUN: 48 mg/dL — ABNORMAL HIGH (ref 6–23)
CO2: 23 meq/L (ref 19–32)
Calcium: 8.8 mg/dL (ref 8.4–10.5)
Chloride: 91 mEq/L — ABNORMAL LOW (ref 96–112)
Creatinine, Ser: 7.74 mg/dL — ABNORMAL HIGH (ref 0.50–1.35)
GFR calc Af Amer: 8 mL/min — ABNORMAL LOW (ref >90)
GFR, EST NON AFRICAN AMERICAN: 7 mL/min — AB (ref >90)
Glucose, Bld: 105 mg/dL — ABNORMAL HIGH (ref 70–99)
POTASSIUM: 4.3 meq/L (ref 3.7–5.3)
SODIUM: 138 meq/L (ref 137–147)
Total Bilirubin: 1.8 mg/dL — ABNORMAL HIGH (ref 0.3–1.2)
Total Protein: 7.7 g/dL (ref 6.0–8.3)

## 2013-12-21 LAB — HEPATITIS B SURFACE ANTIGEN: Hepatitis B Surface Ag: NEGATIVE

## 2013-12-21 LAB — PRO B NATRIURETIC PEPTIDE: Pro B Natriuretic peptide (BNP): >70000 pg/mL — ABNORMAL HIGH (ref 0.0–100.0)

## 2013-12-21 MED ORDER — LEVOFLOXACIN IN D5W 750 MG/150ML IV SOLN
750.0000 mg | Freq: Once | INTRAVENOUS | Status: AC
  Administered 2013-12-21: 750 mg via INTRAVENOUS
  Filled 2013-12-21: qty 150

## 2013-12-21 MED ORDER — LATANOPROST 0.005 % OP SOLN
1.0000 [drp] | Freq: Every day | OPHTHALMIC | Status: DC
  Administered 2013-12-22: 1 [drp] via OPHTHALMIC
  Filled 2013-12-21 (×2): qty 2.5

## 2013-12-21 MED ORDER — SODIUM CHLORIDE 0.9 % IV SOLN
125.0000 mg | INTRAVENOUS | Status: DC
  Administered 2013-12-21 – 2013-12-24 (×3): 125 mg via INTRAVENOUS
  Filled 2013-12-21 (×6): qty 10

## 2013-12-21 MED ORDER — RENA-VITE PO TABS
1.0000 | ORAL_TABLET | Freq: Every day | ORAL | Status: DC
  Administered 2013-12-21: 23:00:00 via ORAL
  Administered 2013-12-22 – 2013-12-24 (×3): 1 via ORAL
  Filled 2013-12-21 (×6): qty 1

## 2013-12-21 MED ORDER — DOCUSATE SODIUM 100 MG PO CAPS
100.0000 mg | ORAL_CAPSULE | Freq: Every day | ORAL | Status: DC
  Administered 2013-12-21 – 2013-12-23 (×3): 100 mg via ORAL
  Filled 2013-12-21 (×3): qty 1

## 2013-12-21 MED ORDER — PRAZOSIN HCL 2 MG PO CAPS
4.0000 mg | ORAL_CAPSULE | Freq: Every day | ORAL | Status: DC
  Administered 2013-12-21 – 2013-12-24 (×4): 4 mg via ORAL
  Filled 2013-12-21 (×6): qty 2

## 2013-12-21 MED ORDER — ASPIRIN 81 MG PO TBEC
81.0000 mg | DELAYED_RELEASE_TABLET | Freq: Every day | ORAL | Status: DC
Start: 2013-12-21 — End: 2013-12-21

## 2013-12-21 MED ORDER — CALCIUM ACETATE 667 MG PO CAPS
1334.0000 mg | ORAL_CAPSULE | Freq: Three times a day (TID) | ORAL | Status: DC
  Administered 2013-12-21 – 2013-12-25 (×10): 1334 mg via ORAL
  Filled 2013-12-21 (×14): qty 2

## 2013-12-21 MED ORDER — ONDANSETRON 4 MG PO TBDP
4.0000 mg | ORAL_TABLET | Freq: Once | ORAL | Status: AC
  Administered 2013-12-21: 4 mg via ORAL

## 2013-12-21 MED ORDER — NEPRO/CARBSTEADY PO LIQD
237.0000 mL | Freq: Two times a day (BID) | ORAL | Status: DC
  Administered 2013-12-21 – 2013-12-24 (×6): 237 mL via ORAL
  Filled 2013-12-21 (×12): qty 237

## 2013-12-21 MED ORDER — HEPARIN SODIUM (PORCINE) 5000 UNIT/ML IJ SOLN
5000.0000 [IU] | Freq: Three times a day (TID) | INTRAMUSCULAR | Status: DC
  Administered 2013-12-21 – 2013-12-25 (×9): 5000 [IU] via SUBCUTANEOUS
  Filled 2013-12-21 (×16): qty 1

## 2013-12-21 MED ORDER — ALBUTEROL SULFATE (2.5 MG/3ML) 0.083% IN NEBU: 2.5000 mg | INHALATION_SOLUTION | Freq: Four times a day (QID) | RESPIRATORY_TRACT | Status: DC | PRN

## 2013-12-21 MED ORDER — DOXERCALCIFEROL 4 MCG/2ML IV SOLN
INTRAVENOUS | Status: AC
Start: 2013-12-21 — End: 2013-12-21
  Filled 2013-12-21: qty 2

## 2013-12-21 MED ORDER — CALCIUM CARBONATE ANTACID 500 MG PO CHEW: 3.0000 | CHEWABLE_TABLET | Freq: Every day | ORAL | Status: DC | PRN

## 2013-12-21 MED ORDER — METOPROLOL TARTRATE 50 MG PO TABS
50.0000 mg | ORAL_TABLET | Freq: Two times a day (BID) | ORAL | Status: DC
  Administered 2013-12-21 (×2): 50 mg via ORAL
  Filled 2013-12-21 (×4): qty 1

## 2013-12-21 MED ORDER — AMLODIPINE BESYLATE 10 MG PO TABS
10.0000 mg | ORAL_TABLET | Freq: Every day | ORAL | Status: DC
  Administered 2013-12-21: 10 mg via ORAL
  Filled 2013-12-21 (×3): qty 1

## 2013-12-21 MED ORDER — TRAZODONE 25 MG HALF TABLET
25.0000 mg | ORAL_TABLET | Freq: Every day | ORAL | Status: DC
  Administered 2013-12-21 – 2013-12-23 (×3): 25 mg via ORAL
  Filled 2013-12-21 (×5): qty 1

## 2013-12-21 MED ORDER — HYDROXYZINE HCL 25 MG PO TABS
25.0000 mg | ORAL_TABLET | Freq: Once | ORAL | Status: AC
  Administered 2013-12-21: 25 mg via ORAL
  Filled 2013-12-21: qty 1

## 2013-12-21 MED ORDER — FOLIC ACID 1 MG PO TABS
1.0000 mg | ORAL_TABLET | Freq: Every day | ORAL | Status: DC
  Administered 2013-12-21 – 2013-12-23 (×3): 1 mg via ORAL
  Filled 2013-12-21 (×5): qty 1

## 2013-12-21 MED ORDER — HYDROXYZINE HCL 25 MG PO TABS
25.0000 mg | ORAL_TABLET | Freq: Four times a day (QID) | ORAL | Status: DC | PRN
Start: 2013-12-21 — End: 2013-12-25
  Administered 2013-12-21 – 2013-12-24 (×5): 25 mg via ORAL
  Filled 2013-12-21 (×5): qty 1

## 2013-12-21 MED ORDER — ALBUTEROL SULFATE HFA 108 (90 BASE) MCG/ACT IN AERS: 1.0000 | INHALATION_SPRAY | Freq: Four times a day (QID) | RESPIRATORY_TRACT | Status: DC | PRN

## 2013-12-21 MED ORDER — DOXERCALCIFEROL 4 MCG/2ML IV SOLN
2.0000 ug | INTRAVENOUS | Status: DC
  Administered 2013-12-21 – 2013-12-24 (×3): 2 ug via INTRAVENOUS
  Filled 2013-12-21 (×2): qty 2

## 2013-12-21 MED ORDER — ASPIRIN EC 81 MG PO TBEC
81.0000 mg | DELAYED_RELEASE_TABLET | Freq: Every day | ORAL | Status: DC
  Administered 2013-12-21 – 2013-12-25 (×4): 81 mg via ORAL
  Filled 2013-12-21 (×5): qty 1

## 2013-12-21 MED ORDER — CALCIUM CARBONATE ANTACID 750 MG PO CHEW: 2.0000 | CHEWABLE_TABLET | Freq: Every day | ORAL | Status: DC | PRN

## 2013-12-21 MED ORDER — ONDANSETRON 4 MG PO TBDP
ORAL_TABLET | ORAL | Status: AC
  Filled 2013-12-21: qty 1

## 2013-12-21 NOTE — H&P (Signed)
Internal Medicine Attending Admission Note Date: 12/21/2013  Patient name: Carl Benson Medical record number: [id number] Date of birth: [birth year] Age: 58 y.o. Gender: male  I saw and evaluated the patient. I reviewed the resident's note and I agree with the resident's findings and plan as documented in the resident's note.  Carl Benson is a 58 year old man with a history of end-stage renal disease requiring hemodialysis, hepatitis C, hypertension, and COPD who presents with gradually worsening shortness of breath for the last 24 hours. He admits to missing his hemodialysis appointment on Tuesday because no one came to take him to the session. He is unsure when his last hemodialysis session was prior to that. He has a history of noncompliance with his hemodialysis and has had frequent readmissions to be dialyzed after missing a session. Of note, he denies fevers, shakes, chills, chest pain, palpitations, or hemoptysis but does admit to a nonproductive cough. He was admitted to the internal medicine teaching service for further evaluation and care.  On rounds this morning he was lying flat without complaints. Physical exam revealed clear lungs without any rhonchi or rales. Abdominal exam demonstrated mild tenderness around the recent paracentesis site. There is no surrounding cellulitis. His abdomen was soft and without rebound. Chest x-ray was concerning for a possible right basilar infiltrate.  Carl Benson is a 58 year old man who presents with increasing dyspnea exertion likely related to both a possible new healthcare associated pneumonia and volume overload secondary to noncompliance with his hemodialysis. We will treat his pneumonia with oral Levaquin and provide hemodialysis per the nephrology service. If he does well after this intervention I suspect he may be ready for discharge once we are able to make sure he is plugged in to the necessary services required so that he maintains his compliance with  hemodialysis.
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
## 2013-12-21 NOTE — ED Notes (Signed)
Called lab and spoke with French Ana, bnp is being done at this time.

## 2013-12-21 NOTE — Progress Notes (Signed)
Patient not in room or on department. Call received to nursing station that patient downstairs at [place].

## 2013-12-21 NOTE — ED Notes (Signed)
See downtime charting for triage.

## 2013-12-21 NOTE — Progress Notes (Signed)
Patient has not returned to room. Security notified.

## 2013-12-21 NOTE — Consult Note (Signed)
[facility] Renal Consultation Note    Indication for Consultation:  Management of ESRD/hemodialysis; anemia, hypertension/volume and secondary hyperparathyroidism  HPI: Carl KYSER is a 58 y.o. male ESRD patient (TTS HD) with multiple medical issues including COPD, cirrhosis, chronic ascites and medical non-compliance stemming in part from his social situation (homelessness, substance abuse). He presented to the ED with complaints of abdominal pain and requesting to be dialyzed. He states that he missed his outpatient appointment yesterday because the transportation service did not come.  Notes indicate that his oxygen saturations dipped to the 80's in the ED, requiring supplemental oxygen. At the time of this encounter, he has just arrived on the floor and is minimally cooperative with ROS and physical exam.   Of note, he was seen in the ED on 12/19/13 for abdominal distention; paracentesis was performed yielding 1L of fluid and he was discharged. He was admitted 2/1-2/3 for abdominal distention but left AMA as he has done in the past.  Past Medical History  Diagnosis Date  . COPD (chronic obstructive pulmonary disease)   . Hypertension   . Hep C w/ coma, chronic   . Irregular heartbeat   . ESRD (end stage renal disease) on dialysis     /notes 11/11/2013  . Smoker unmotivated to quit   . Active smoker   . PTSD (post-traumatic stress disorder)    Past Surgical History  Procedure Laterality Date  . Paracentesis  ~ 10/2013    [name] 11/11/2013   Family History  Problem Relation Age of Onset  . Heart disease Mother   . Hypertension Mother   . Diabetes Mother    Social History:  reports that he has been smoking Cigarettes.  He has a 22.5 pack-year smoking history. He does not have any smokeless tobacco history on file. He reports that he uses illicit drugs ("Crack" cocaine, Cocaine, Marijuana, and Other-see comments). He reports that he does not drink alcohol. No Known  Allergies Prior to Admission medications   Medication Sig Start Date End Date Taking? Authorizing Provider  albuterol (PROVENTIL HFA;VENTOLIN HFA) 108 (90 BASE) MCG/ACT inhaler Inhale 1-2 puffs into the lungs every 6 (six) hours as needed for wheezing or shortness of breath.   Yes Historical Provider, MD  amLODipine (NORVASC) 10 MG tablet Take 1 tablet (10 mg total) by mouth daily. 11/26/13  Yes Marjan Rabbani, MD  aspirin 81 MG EC tablet Take 1 tablet (81 mg total) by mouth daily. 11/17/13  Yes Annett Gula, MD  b complex-vitamin c-folic acid (NEPHRO-VITE) 0.8 MG TABS tablet Take 1 tablet by mouth at bedtime. 11/17/13  Yes Otis Brace, MD  calcium acetate (PHOSLO) 667 MG capsule Take 2 capsules (1,334 mg total) by mouth 3 (three) times daily with meals. 11/17/13  Yes Annett Gula, MD  calcium carbonate (TUMS EX) 750 MG chewable tablet Chew 2 tablets by mouth daily as needed for heartburn.   Yes Historical Provider, MD  diphenhydrAMINE (BENADRYL) 25 MG tablet Take 1 tablet (25 mg total) by mouth every 6 (six) hours. 12/02/13  Yes Olivia Mackie, MD  docusate sodium (COLACE) 100 MG capsule Take 1 capsule (100 mg total) by mouth daily. 11/17/13  Yes Marjan Rabbani, MD  folic acid (FOLVITE) 1 MG tablet Take 1 tablet (1 mg total) by mouth daily. 11/17/13  Yes Otis Brace, MD  hydrOXYzine (ATARAX/VISTARIL) 25 MG tablet Take 1 tablet (25 mg total) by mouth every 6 (six) hours. 11/27/13  Yes Derwood Kaplan, MD  latanoprost (XALATAN) 0.005 % ophthalmic solution Place 1 drop into both eyes at bedtime. Affected eye 11/17/13  Yes Otis Brace, MD  metoprolol (LOPRESSOR) 50 MG tablet Take 1 tablet (50 mg total) by mouth 2 (two) times daily. 11/17/13  Yes Annett Gula, MD  prazosin (MINIPRESS) 2 MG capsule Take 2 capsules (4 mg total) by mouth at bedtime. 11/17/13  Yes Annett Gula, MD  traZODone (DESYREL) 25 mg TABS tablet Take 0.5 tablets (25 mg total) by mouth at bedtime. 11/21/13  Yes Otis Brace, MD   No  current facility-administered medications for this encounter.   Current Outpatient Prescriptions  Medication Sig Dispense Refill  . albuterol (PROVENTIL HFA;VENTOLIN HFA) 108 (90 BASE) MCG/ACT inhaler Inhale 1-2 puffs into the lungs every 6 (six) hours as needed for wheezing or shortness of breath.      Marland Kitchen amLODipine (NORVASC) 10 MG tablet Take 1 tablet (10 mg total) by mouth daily.  30 tablet  1  . aspirin 81 MG EC tablet Take 1 tablet (81 mg total) by mouth daily.  30 tablet  3  . b complex-vitamin c-folic acid (NEPHRO-VITE) 0.8 MG TABS tablet Take 1 tablet by mouth at bedtime.  30 tablet  3  . calcium acetate (PHOSLO) 667 MG capsule Take 2 capsules (1,334 mg total) by mouth 3 (three) times daily with meals.  90 capsule  3  . calcium carbonate (TUMS EX) 750 MG chewable tablet Chew 2 tablets by mouth daily as needed for heartburn.      . diphenhydrAMINE (BENADRYL) 25 MG tablet Take 1 tablet (25 mg total) by mouth every 6 (six) hours.  20 tablet  0  . docusate sodium (COLACE) 100 MG capsule Take 1 capsule (100 mg total) by mouth daily.  30 capsule  3  . folic acid (FOLVITE) 1 MG tablet Take 1 tablet (1 mg total) by mouth daily.  30 tablet  3  . hydrOXYzine (ATARAX/VISTARIL) 25 MG tablet Take 1 tablet (25 mg total) by mouth every 6 (six) hours.  30 tablet  0  . latanoprost (XALATAN) 0.005 % ophthalmic solution Place 1 drop into both eyes at bedtime. Affected eye  2.5 mL  12  . metoprolol (LOPRESSOR) 50 MG tablet Take 1 tablet (50 mg total) by mouth 2 (two) times daily.  60 tablet  1  . prazosin (MINIPRESS) 2 MG capsule Take 2 capsules (4 mg total) by mouth at bedtime.  30 capsule  3  . traZODone (DESYREL) 25 mg TABS tablet Take 0.5 tablets (25 mg total) by mouth at bedtime.  30 tablet  1   Labs: Basic Metabolic Panel:  Recent Labs Lab 12/19/13 1728 12/21/13 0255  NA 138 138  K 3.5* 4.3  CL 95* 91*  CO2  --  23  GLUCOSE 157* 105*  BUN 34* 48*  CREATININE 6.60* 7.74*  CALCIUM  --  8.8    Liver Function Tests:  Recent Labs Lab 12/21/13 0255  AST 91*  ALT 49  ALKPHOS 226*  BILITOT 1.8*  PROT 7.7  ALBUMIN 2.9*   No results found for this basename: LIPASE, AMYLASE,  in the last 168 hours No results found for this basename: AMMONIA,  in the last 168 hours CBC:  Recent Labs Lab 12/19/13 1728 12/21/13 0255  WBC  --  9.0  NEUTROABS  --  5.7  HGB 13.6 11.0*  HCT 40.0 31.6*  MCV  --  75.2*  PLT  --  135*   Studies/Results: Dg  Chest 2 View  12/21/2013   CLINICAL DATA:  Shortness of breath, cough  EXAM: CHEST  2 VIEW  COMPARISON:  December 19, 2013  FINDINGS: The heart size and mediastinal contours are stable. The heart size is enlarged. There is no pulmonary edema. Patchy consolidation of right lung base is noted. There is a small posterior pleural effusion. The visualized skeletal structures are stable.  IMPRESSION: Right lung base pneumonia.  Cardiomegaly.   Electronically Signed   By: Sherian ReinWei-Chen  Lin M.D.   On: 12/21/2013 02:42   Dg Chest Port 1 View  12/19/2013   CLINICAL DATA:  Shortness of breath  EXAM: PORTABLE CHEST - 1 VIEW  COMPARISON:  December 12, 2013  FINDINGS: There is no edema or consolidation. Heart is enlarged with normal pulmonary vascularity. No adenopathy. No bone lesions.  IMPRESSION: Cardiomegaly.  No edema or consolidation.   Electronically Signed   By: Bretta BangWilliam  Woodruff M.D.   On: 12/19/2013 16:07    ROS: 10 pt ROS attempted, but unable to complete. Patient uncooperative.   Physical Exam: Filed Vitals:   12/21/13 0515 12/21/13 0629 12/21/13 0700 12/21/13 0804  BP: 135/90 131/90 138/95 144/80  Pulse: 92 92 96 93  Temp:    97.9 F (36.6 C)  TempSrc:    Oral  Resp: 14 17  16   SpO2: 96% 96% 96% 98%     General: Malodorous, ill-appearing, in no acute distress. Head: Normocephalic, atraumatic, sclera non-icteric, mucus membranes are moist Neck: Supple. JVD noted Lungs: Bilateral rhonchi, diminished at bases. Heart: RRR with S1 S2. No  murmurs, rubs, or gallops appreciated. Abdomen: Soft, +++distention, normoactive bowel sounds. No rebound/guarding. No obvious abdominal masses. M-S:  Strength and tone appear normal for age. Lower extremities: Dry, cracked LE's with +edema. Fissure rt heel without overt signs of infection  Neuro: Moves all extremities spontaneously. Psych:  Unable to eval,  Dialysis Access: R AVG + bruit  Dialysis: TTS [facility] EDW 58.3 kg HD Bath 2K/2.25Ca  Time 4:00 Heparin 3000. Access R AVG BFR 400 DFR A1.5    Hectorol 2 mcg IV/HD Epogen 3800   Units IV/HD  Venofer  100 mg IV/HD x 10 doses ordered 2/3 (8 remaining)  Recent labs: Hgb 10.5, Tsat 22%, Ferr 312, P 10.1, PTH 507  Assessment/Plan: 1. Shortness of Breath - Mgmt per primary. CXR suggestive on RLL PNA. Low suspicion for COPD exacerbation. On levaquin 2. Abdominal Distention - Multifactorial due to cirrhosis, ascites requiring, and frequent sign-offs with HD. ^LFTs. Per admit, considering abd U/S to assess portal vein thrombosis +/- repeat therapeutic paracentesis. 3. ESRD -  TTS, No HD since 2/7. HD today for vol xs. K+ 4.3 4. Hypertension/volume  - SBPs 130s-140s. Home meds are norvasc 10 and metoprolol 50 BID. Up ~5.2kg here with rales, JVD and LE edema. Prominent ascites and frequent sign-offs complicating true eval of his ideal dry weight. Try for 4-5L today and again tomorrow to get back on schedule.  5. Anemia  - Hgb 11 on op Epo. Add ESA if hgb drops < 11. IV fe load in progress with 8 doses remaining. Will continue. 6. Metabolic bone disease -  Corr Ca 9.7. Phos poorly controlled on Phoslo. Last PTH 507. Cont vit d and binders. 7. Nutrition - Alb 2.9. Renal diet, multivitamin, ONS 8. Hx PAF - metoprolol, ASA per primary 9. Medical non-compliance /Difficult social situation - Homeless, estranged from family, moved from [place] without making appropriate arrangements and has been without a means  of managing his multiple medical issues  for quite some time. Efforts have been made to get him connected with the [facility] in [place]. He has an appointment on 2/13.  Claud Kelp, PA-C [facility] Pager [phone] 12/21/2013, 9:00 AM   I have seen and examined patient, discussed with PA and agree with assessment and plan as outlined above.  ESRD patient with difficult social situation, missed dialysis, here with SOB and volume excess. CXR shows questionable infiltrate, does not look much different than cxr on last admit. Agree with no antibiotics. Plan HD today and again tomorrow, decrease maximum volume as tolerated. Will follow [name] MD pager [phone]    cell [phone] 12/21/2013, 12:21 PM

## 2013-12-21 NOTE — Procedures (Signed)
I was present at this dialysis session, have reviewed the session itself and made  appropriate changes  [name] MD (pgr) [phone]    [phone] 12/21/2013, 1:58 PM

## 2013-12-21 NOTE — ED Notes (Signed)
Attempted report. Waiting for charge nurse to return call.

## 2013-12-21 NOTE — Progress Notes (Addendum)
Patient returned to room. Smells of cigarettes. Reminded of smoke-free policy. Encouraged to ambulate on department only. Dr. Yetta Barre notified of patient's return.

## 2013-12-21 NOTE — ED Notes (Signed)
Pt was sleeping and his SpO2 dropped to 83%, pt placed on nasal canula at 2L. Pts SpO2 is at 96%at this time. Dr. Ranae Palms aware.

## 2013-12-21 NOTE — Progress Notes (Signed)
Patient returned from dialysis.

## 2013-12-21 NOTE — ED Provider Notes (Signed)
CSN: [id number]     Arrival date & time 12/21/13  0022 History   First MD Initiated Contact with Patient 12/21/13 0207     Chief Complaint  Patient presents with  . Abdominal Pain     (Consider location/radiation/quality/duration/timing/severity/associated sxs/prior Treatment) HPI Patient is a very poor historian with history of COPD, end-stage renal disease, and cirrhosis. He states he normally is dialyzed on Tuesday Thursday and Saturday. He may since Tuesday dialysis. He is unable to verbalize why he decided at 1 AM to call EMS. He has had abdominal swelling but this is unchanged recently. He has no abdominal pain. Past Medical History  Diagnosis Date  . COPD (chronic obstructive pulmonary disease)   . Hypertension   . Hep C w/ coma, chronic   . Irregular heartbeat   . ESRD (end stage renal disease) on dialysis     /notes 11/11/2013  . Smoker unmotivated to quit   . Active smoker   . PTSD (post-traumatic stress disorder)    Past Surgical History  Procedure Laterality Date  . Paracentesis  ~ 10/2013    [name]/notes 11/11/2013   No family history on file. History  Substance Use Topics  . Smoking status: Current Every Day Smoker -- 1.50 packs/day for 15 years    Types: Cigarettes  . Smokeless tobacco: Not on file  . Alcohol Use: No    Review of Systems  Constitutional: Negative for fever and chills.  Respiratory: Positive for shortness of breath. Negative for cough and wheezing.   Cardiovascular: Negative for chest pain, palpitations and leg swelling.  Gastrointestinal: Positive for abdominal distention. Negative for nausea, vomiting, abdominal pain, diarrhea and constipation.  Musculoskeletal: Negative for back pain, neck pain and neck stiffness.  Skin: Negative for rash and wound.  Neurological: Negative for dizziness, weakness, light-headedness, numbness and headaches.  All other systems reviewed and are negative.      Allergies  Review of patient's allergies indicates  no known allergies.  Home Medications   Current Outpatient Rx  Name  Route  Sig  Dispense  Refill  . albuterol (PROVENTIL HFA;VENTOLIN HFA) 108 (90 BASE) MCG/ACT inhaler   Inhalation   Inhale 1-2 puffs into the lungs every 6 (six) hours as needed for wheezing or shortness of breath.         Marland Kitchen. amLODipine (NORVASC) 10 MG tablet   Oral   Take 1 tablet (10 mg total) by mouth daily.   30 tablet   1   . aspirin 81 MG EC tablet   Oral   Take 1 tablet (81 mg total) by mouth daily.   30 tablet   3   . b complex-vitamin c-folic acid (NEPHRO-VITE) 0.8 MG TABS tablet   Oral   Take 1 tablet by mouth at bedtime.   30 tablet   3   . calcium acetate (PHOSLO) 667 MG capsule   Oral   Take 2 capsules (1,334 mg total) by mouth 3 (three) times daily with meals.   90 capsule   3   . calcium carbonate (TUMS EX) 750 MG chewable tablet   Oral   Chew 2 tablets by mouth daily as needed for heartburn.         . diphenhydrAMINE (BENADRYL) 25 MG tablet   Oral   Take 1 tablet (25 mg total) by mouth every 6 (six) hours.   20 tablet   0   . docusate sodium (COLACE) 100 MG capsule   Oral   Take 1  capsule (100 mg total) by mouth daily.   30 capsule   3   . folic acid (FOLVITE) 1 MG tablet   Oral   Take 1 tablet (1 mg total) by mouth daily.   30 tablet   3   . hydrOXYzine (ATARAX/VISTARIL) 25 MG tablet   Oral   Take 1 tablet (25 mg total) by mouth every 6 (six) hours.   30 tablet   0   . latanoprost (XALATAN) 0.005 % ophthalmic solution   Both Eyes   Place 1 drop into both eyes at bedtime. Affected eye   2.5 mL   12   . metoprolol (LOPRESSOR) 50 MG tablet   Oral   Take 1 tablet (50 mg total) by mouth 2 (two) times daily.   60 tablet   1   . prazosin (MINIPRESS) 2 MG capsule   Oral   Take 2 capsules (4 mg total) by mouth at bedtime.   30 capsule   3   . traZODone (DESYREL) 25 mg TABS tablet   Oral   Take 0.5 tablets (25 mg total) by mouth at bedtime.   30 tablet    1    BP 147/101  Pulse 92  Resp 22  SpO2 95% Physical Exam  Nursing note and vitals reviewed. Constitutional: He is oriented to person, place, and time. He appears well-developed and well-nourished. No distress.  HENT:  Head: Normocephalic and atraumatic.  Mouth/Throat: Oropharynx is clear and moist.  Eyes: EOM are normal. Pupils are equal, round, and reactive to light.  Neck: Normal range of motion. Neck supple.  Cardiovascular: Normal rate and regular rhythm.   Pulmonary/Chest: Effort normal. No respiratory distress. He has no wheezes. He has rales (scattered crackles). He exhibits no tenderness.  Abdominal: Soft. Bowel sounds are normal. He exhibits distension. There is no tenderness. There is no rebound and no guarding.  Musculoskeletal: Normal range of motion. He exhibits no edema and no tenderness.  Neurological: He is alert and oriented to person, place, and time.  Patient is alert and oriented x3 with clear, goal oriented speech. Patient has 5/5 motor in all extremities. Sensation is intact to light touch.   Skin: Skin is warm and dry. No rash noted. No erythema.  Psychiatric: He has a normal mood and affect. His behavior is normal.    ED Course  Procedures (including critical care time) Labs Review Labs Reviewed  PRO B NATRIURETIC PEPTIDE  CBC WITH DIFFERENTIAL  COMPREHENSIVE METABOLIC PANEL  POCT I-STAT TROPONIN I   Imaging Review Dg Chest 2 View  12/21/2013   CLINICAL DATA:  Shortness of breath, cough  EXAM: CHEST  2 VIEW  COMPARISON:  December 19, 2013  FINDINGS: The heart size and mediastinal contours are stable. The heart size is enlarged. There is no pulmonary edema. Patchy consolidation of right lung base is noted. There is a small posterior pleural effusion. The visualized skeletal structures are stable.  IMPRESSION: Right lung base pneumonia.  Cardiomegaly.   Electronically Signed   By: Sherian Rein M.D.   On: 12/21/2013 02:42   Dg Chest Port 1 View  12/19/2013    CLINICAL DATA:  Shortness of breath  EXAM: PORTABLE CHEST - 1 VIEW  COMPARISON:  December 12, 2013  FINDINGS: There is no edema or consolidation. Heart is enlarged with normal pulmonary vascularity. No adenopathy. No bone lesions.  IMPRESSION: Cardiomegaly.  No edema or consolidation.   Electronically Signed   By: Bretta Bang M.D.  On: 12/19/2013 16:07    EKG Interpretation   None      Date: 12/21/2013  Rate:93  Rhythm: normal sinus rhythm  QRS Axis: normal  Intervals: normal  ST/T Wave abnormalities: nonspecific T wave changes  Conduction Disutrbances:none  Narrative Interpretation:   Old EKG Reviewed: none available    MDM   Final diagnoses:  None    Patient is resting comfortably in no distress. His oxygen saturation drop into the 80s requiring supplemental oxygen. I have very low suspicion for pneumonia in this patient. I discussed with the teaching service and they will with patient patient will be dialyzed while in house.    Loren Racer, MD 12/21/13 [phone]

## 2013-12-21 NOTE — ED Notes (Signed)
Per EMS, pt missed dialysis today and wants to be dialyzed. Pt also reports generalized abd pain. NAD at this time. Pt goes to dialysis on Tuesday, Thursday, and Saturday.

## 2013-12-21 NOTE — ED Notes (Signed)
See downtime charting. 

## 2013-12-22 ENCOUNTER — Encounter (HOSPITAL_COMMUNITY): Payer: Self-pay | Admitting: General Practice

## 2013-12-22 ENCOUNTER — Inpatient Hospital Stay (HOSPITAL_COMMUNITY): Payer: Medicare Other

## 2013-12-22 LAB — CBC
HCT: 27.1 % — ABNORMAL LOW (ref 39.0–52.0)
Hemoglobin: 9.7 g/dL — ABNORMAL LOW (ref 13.0–17.0)
MCH: 26.9 pg (ref 26.0–34.0)
MCHC: 35.8 g/dL (ref 30.0–36.0)
MCV: 75.1 fL — AB (ref 78.0–100.0)
PLATELETS: 89 10*3/uL — AB (ref 150–400)
RBC: 3.61 MIL/uL — ABNORMAL LOW (ref 4.22–5.81)
RDW: 19.2 % — ABNORMAL HIGH (ref 11.5–15.5)
WBC: 8.9 10*3/uL (ref 4.0–10.5)

## 2013-12-22 LAB — COMPREHENSIVE METABOLIC PANEL
ALT: 39 U/L (ref 0–53)
AST: 76 U/L — AB (ref 0–37)
Albumin: 2.5 g/dL — ABNORMAL LOW (ref 3.5–5.2)
Alkaline Phosphatase: 206 U/L — ABNORMAL HIGH (ref 39–117)
BUN: 35 mg/dL — ABNORMAL HIGH (ref 6–23)
CALCIUM: 8.3 mg/dL — AB (ref 8.4–10.5)
CO2: 27 mEq/L (ref 19–32)
Chloride: 94 mEq/L — ABNORMAL LOW (ref 96–112)
Creatinine, Ser: 6.03 mg/dL — ABNORMAL HIGH (ref 0.50–1.35)
GFR calc non Af Amer: 9 mL/min — ABNORMAL LOW (ref >90)
GFR, EST AFRICAN AMERICAN: 11 mL/min — AB (ref >90)
Glucose, Bld: 127 mg/dL — ABNORMAL HIGH (ref 70–99)
Potassium: 4.2 mEq/L (ref 3.7–5.3)
Sodium: 140 mEq/L (ref 137–147)
Total Bilirubin: 1.4 mg/dL — ABNORMAL HIGH (ref 0.3–1.2)
Total Protein: 6.7 g/dL (ref 6.0–8.3)

## 2013-12-22 LAB — HIV ANTIBODY (ROUTINE TESTING W REFLEX): HIV: NONREACTIVE

## 2013-12-22 LAB — MAGNESIUM: MAGNESIUM: 2.3 mg/dL (ref 1.5–2.5)

## 2013-12-22 MED ORDER — VANCOMYCIN HCL 500 MG IV SOLR: 500.0000 mg | INTRAVENOUS | Status: DC

## 2013-12-22 MED ORDER — VANCOMYCIN HCL 10 G IV SOLR
1250.0000 mg | Freq: Once | INTRAVENOUS | Status: AC
  Administered 2013-12-22: 1250 mg via INTRAVENOUS
  Filled 2013-12-22: qty 1250

## 2013-12-22 MED ORDER — DOXERCALCIFEROL 4 MCG/2ML IV SOLN
INTRAVENOUS | Status: AC
  Administered 2013-12-22: 2 ug via INTRAVENOUS
  Filled 2013-12-22: qty 2

## 2013-12-22 MED ORDER — AMLODIPINE BESYLATE 5 MG PO TABS
5.0000 mg | ORAL_TABLET | Freq: Every day | ORAL | Status: DC
Start: 2013-12-22 — End: 2013-12-24
  Administered 2013-12-22 – 2013-12-23 (×2): 5 mg via ORAL
  Filled 2013-12-22 (×3): qty 1

## 2013-12-22 MED ORDER — METOPROLOL TARTRATE 25 MG PO TABS
25.0000 mg | ORAL_TABLET | Freq: Two times a day (BID) | ORAL | Status: DC
  Administered 2013-12-22 – 2013-12-25 (×6): 25 mg via ORAL
  Filled 2013-12-22 (×11): qty 1

## 2013-12-22 MED ORDER — DEXTROSE 5 % IV SOLN
1.0000 g | INTRAVENOUS | Status: DC
  Administered 2013-12-22: 1 g via INTRAVENOUS
  Filled 2013-12-22 (×2): qty 1

## 2013-12-22 MED ORDER — BENZONATATE 100 MG PO CAPS
100.0000 mg | ORAL_CAPSULE | Freq: Two times a day (BID) | ORAL | Status: DC | PRN
  Filled 2013-12-22: qty 1

## 2013-12-22 MED ORDER — DEXTROSE 5 % IV SOLN: 1.0000 g | Freq: Three times a day (TID) | INTRAVENOUS | Status: DC

## 2013-12-22 NOTE — Progress Notes (Signed)
Internal Medicine Attending  Date: 12/22/2013  Patient name: Carl Benson Medical record number: [id number] Date of birth: [birth year] Age: 58 y.o. Gender: male  I saw and evaluated the patient. I reviewed the resident's note by Dr. Yetta Barre and I agree with the resident's findings and plans as documented in his progress note.  [name] notes the development of a yellow sputum with his cough.  CXR reveals persistent right basilar infiltrate despite 2 days of HD which suggests this is likely a health-care associate pneumonia.  I anticipate discharge soon once barriers to compliance with making HD sessions has been addressed with SW/CM.  Will therefore empirically treat with a course of Levaquin dosed to GFR = 0 (250 mg PO Q48H for 14 days (7 doses)).

## 2013-12-22 NOTE — H&P (Signed)
Patient very agitated and verbally aggressive ,staes he has PTS and is ready to "flip out"   History will be completed at another time as to not upset the patient.
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
## 2013-12-22 NOTE — Procedures (Signed)
I was present at this dialysis session, have reviewed the session itself and made  appropriate changes  [name] MD (pgr) [phone]    ([phone] 12/22/2013, 9:51 AM

## 2013-12-22 NOTE — Progress Notes (Signed)
ANTIBIOTIC CONSULT NOTE - INITIAL  Pharmacy Consult for Vancomycin  Indication: pneumonia  No Known Allergies  Patient Measurements: Height: 5\' 5"  (165.1 cm) Weight: 124 lb 9 oz (56.5 kg) (standing weight) IBW/kg (Calculated) : 61.5 Adjusted Body Weight:   Vital Signs: Temp: 98.4 F (36.9 C) (02/12 1415) Temp src: Oral (02/12 1415) BP: 129/74 mmHg (02/12 1415) Pulse Rate: 83 (02/12 1415) Intake/Output from previous day: 02/11 0701 - 02/12 0700 In: 660 [P.O.:420; NG/GT:240] Out: 1769  Intake/Output from this shift: Total I/O In: -  Out: 3500 [Other:3500]  Labs:  Recent Labs  12/19/13 1728 12/21/13 0255 12/22/13 0327  WBC  --  9.0 8.9  HGB 13.6 11.0* 9.7*  PLT  --  135* 89*  CREATININE 6.60* 7.74* 6.03*   Estimated Creatinine Clearance: 10.5 ml/min (by C-G formula based on Cr of 6.03). No results found for this basename: VANCOTROUGH, Leodis Binet, VANCORANDOM, GENTTROUGH, GENTPEAK, GENTRANDOM, TOBRATROUGH, TOBRAPEAK, TOBRARND, AMIKACINPEAK, AMIKACINTROU, AMIKACIN,  in the last 72 hours   Microbiology: Recent Results (from the past 720 hour(s))  BODY FLUID CULTURE     Status: None   Collection Time    12/09/13  9:36 AM      Result Value Ref Range Status   Specimen Description ASCITIC ABDOMEN FLUID   Final   Special Requests FLUID   Final   Gram Stain     Final   Value: RARE WBC PRESENT,BOTH PMN AND MONONUCLEAR     NO ORGANISMS SEEN     Performed at [facility]   Culture     Final   Value: NO GROWTH 3 DAYS     Performed at [facility]   Report Status 12/12/2013 FINAL   Final    Medical History: Past Medical History  Diagnosis Date  . COPD (chronic obstructive pulmonary disease)   . Hypertension   . Hep C w/ coma, chronic   . Irregular heartbeat   . ESRD (end stage renal disease) on dialysis     /notes 11/11/2013  . Smoker unmotivated to quit   . Active smoker   . PTSD (post-traumatic stress disorder)   . Shortness of breath      Medications:  Scheduled:  . amLODipine  5 mg Oral Daily  . aspirin EC  81 mg Oral Daily  . calcium acetate  1,334 mg Oral TID WC  . ceFEPime (MAXIPIME) IV  1 g Intravenous Q24H  . docusate sodium  100 mg Oral Daily  . doxercalciferol  2 mcg Intravenous Q T,Th,Sa-HD  . feeding supplement (NEPRO CARB STEADY)  237 mL Oral BID BM  . ferric gluconate (FERRLECIT/NULECIT) IV  125 mg Intravenous Q T,Th,Sa-HD  . folic acid  1 mg Oral Daily  . heparin  5,000 Units Subcutaneous 3 times per day  . latanoprost  1 drop Both Eyes QHS  . metoprolol  25 mg Oral BID  . multivitamin  1 tablet Oral QHS  . prazosin  4 mg Oral QHS  . traZODone  25 mg Oral QHS  . vancomycin  1,250 mg Intravenous Once  . [START ON 12/24/2013] vancomycin  500 mg Intravenous Q T,Th,Sa-HD   Assessment: 58 yr old male, ESRD on HD, Homeless and non-compliant, came to the ED for HD due to missing his last HD when his ride did not show up. The pt was complaining of SOB which did not improve after HD. His CXR today was consistent with pneumonia. The pt has been started on Maxipime and  Vancomycin (per RX).    Goal of Therapy:  Vancomycin trough level 15-20 mcg/ml  Plan:  Pt has had HD today. Will order a loading dose of 1250 mg IV once, and then 500 mg Vancomycin after each HD. Levels when appropriate.  Eugene Garnetotter, Dawan Farney Sue 12/22/2013,4:24 PM

## 2013-12-22 NOTE — Progress Notes (Signed)
  [facility] Progress Note   Subjective: 2.5kg off w HD yest, no complaints today  Exam  Blood pressure 136/82, pulse 87, temperature 97.5 F (36.4 C), temperature source Oral, resp. rate 18, height 5\' 5"  (1.651 m), weight 60.4 kg (133 lb 2.5 oz), SpO2 96.00%. Exam Alert, no distress +jvd R chest faint rales at base, L clear RRR no MRG Abd soft, mild ascites, nontender +1 pitting pretib edema bilat Neuro is nf, ox3 RUE AVG patent  Dialysis: TTS [facility] 4h   58.5kg    2K/2.25[place]ath   Heparin 3000  RUE AVG  Hectorol 2     Epo 3800     Venofer 100/HD x 10, started 2/3 Last labs: Hb 10.5 tsat 22% ferritin 312 phos 10.1  pth 507   Assessment: 1 Dyspnea- vol overload, possible PNA 2 ESRD 3 Hep C / cirrhosis / ascites 4 HTN/volume- decrease meds slightly w vol removal, challenge (try to lower) dry wt 5 Hx PAF, on MTP 6 Anemia, cont IV Fe 7 2HPTH- no change in Rx 8 Medical nonadherence   Plan- HD again today, maximum UF as tol, lower BP meds    [name] MD  pager [phone]    cell [phone]  12/22/2013, 9:43 AM     Recent Labs Lab 12/19/13 1728 12/21/13 0255 12/22/13 0327  NA 138 138 140  K 3.5* 4.3 4.2  CL 95* 91* 94*  CO2  --  23 27  GLUCOSE 157* 105* 127*  BUN 34* 48* 35*  CREATININE 6.60* 7.74* 6.03*  CALCIUM  --  8.8 8.3*    Recent Labs Lab 12/21/13 0255 12/22/13 0327  AST 91* 76*  ALT 49 39  ALKPHOS 226* 206*  BILITOT 1.8* 1.4*  PROT 7.7 6.7  ALBUMIN 2.9* 2.5*    Recent Labs Lab 12/19/13 1728 12/21/13 0255 12/22/13 0327  WBC  --  9.0 8.9  NEUTROABS  --  5.7  --   HGB 13.6 11.0* 9.7*  HCT 40.0 31.6* 27.1*  MCV  --  75.2* 75.1*  PLT  --  135* 89*   . amLODipine  10 mg Oral Daily  . aspirin EC  81 mg Oral Daily  . calcium acetate  1,334 mg Oral TID WC  . docusate sodium  100 mg Oral Daily  . doxercalciferol  2 mcg Intravenous Q T,Th,Sa-HD  . feeding supplement (NEPRO CARB STEADY)  237 mL Oral BID BM  . ferric  gluconate (FERRLECIT/NULECIT) IV  125 mg Intravenous Q T,Th,Sa-HD  . folic acid  1 mg Oral Daily  . heparin  5,000 Units Subcutaneous 3 times per day  . latanoprost  1 drop Both Eyes QHS  . metoprolol  50 mg Oral BID  . multivitamin  1 tablet Oral QHS  . prazosin  4 mg Oral QHS  . traZODone  25 mg Oral QHS     albuterol, calcium carbonate, hydrOXYzine

## 2013-12-22 NOTE — Progress Notes (Signed)
Pt. became hostile when move was initiated for 6E, stated he never said he wanted to transfer.Pt. again reminded to stay on unit,he agreed.                    r

## 2013-12-23 DIAGNOSIS — J189 Pneumonia, unspecified organism: Secondary | ICD-10-CM

## 2013-12-23 LAB — COMPREHENSIVE METABOLIC PANEL
ALBUMIN: 2.3 g/dL — AB (ref 3.5–5.2)
ALT: 37 U/L (ref 0–53)
AST: 83 U/L — ABNORMAL HIGH (ref 0–37)
Alkaline Phosphatase: 195 U/L — ABNORMAL HIGH (ref 39–117)
BUN: 22 mg/dL (ref 6–23)
CALCIUM: 8.2 mg/dL — AB (ref 8.4–10.5)
CO2: 22 mEq/L (ref 19–32)
Chloride: 95 mEq/L — ABNORMAL LOW (ref 96–112)
Creatinine, Ser: 4.18 mg/dL — ABNORMAL HIGH (ref 0.50–1.35)
GFR calc Af Amer: 17 mL/min — ABNORMAL LOW (ref >90)
GFR calc non Af Amer: 14 mL/min — ABNORMAL LOW (ref >90)
GLUCOSE: 96 mg/dL (ref 70–99)
Potassium: 4.5 mEq/L (ref 3.7–5.3)
SODIUM: 134 meq/L — AB (ref 137–147)
TOTAL PROTEIN: 6.5 g/dL (ref 6.0–8.3)
Total Bilirubin: 1.3 mg/dL — ABNORMAL HIGH (ref 0.3–1.2)

## 2013-12-23 LAB — CBC
HCT: 29 % — ABNORMAL LOW (ref 39.0–52.0)
HEMOGLOBIN: 9.7 g/dL — AB (ref 13.0–17.0)
MCH: 26 pg (ref 26.0–34.0)
MCHC: 33.4 g/dL (ref 30.0–36.0)
MCV: 77.7 fL — ABNORMAL LOW (ref 78.0–100.0)
Platelets: 89 10*3/uL — ABNORMAL LOW (ref 150–400)
RBC: 3.73 MIL/uL — ABNORMAL LOW (ref 4.22–5.81)
RDW: 19.8 % — AB (ref 11.5–15.5)
WBC: 6.9 10*3/uL (ref 4.0–10.5)

## 2013-12-23 MED ORDER — LEVOFLOXACIN 750 MG PO TABS
750.0000 mg | ORAL_TABLET | Freq: Once | ORAL | Status: AC
  Administered 2013-12-23: 750 mg via ORAL
  Filled 2013-12-23: qty 1

## 2013-12-23 MED ORDER — VANCOMYCIN HCL 500 MG IV SOLR
500.0000 mg | INTRAVENOUS | Status: DC
  Administered 2013-12-24: 500 mg via INTRAVENOUS
  Filled 2013-12-23 (×2): qty 500

## 2013-12-23 MED ORDER — LEVOFLOXACIN 500 MG PO TABS
500.0000 mg | ORAL_TABLET | ORAL | Status: DC
  Filled 2013-12-23: qty 1

## 2013-12-23 MED ORDER — DARBEPOETIN ALFA-POLYSORBATE 40 MCG/0.4ML IJ SOLN
40.0000 ug | INTRAMUSCULAR | Status: DC
  Administered 2013-12-24: 40 ug via INTRAVENOUS
  Filled 2013-12-23: qty 0.4

## 2013-12-23 MED ORDER — LEVOFLOXACIN 250 MG PO TABS
250.0000 mg | ORAL_TABLET | ORAL | Status: DC
  Filled 2013-12-23: qty 1
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
## 2013-12-23 NOTE — Progress Notes (Signed)
  Date: 12/23/2013  Patient name: Carl Benson  Medical record number: [id number]  Date of birth: 07/04/1956   This patient has been seen and the plan of care was discussed with the house staff. Please see their note for complete details. I concur with their findings with the following additions/corrections: [name] was lying in bed. He feels tired but is subjectively better in regards to breathing. No ABD pain, decreased distension. Able to ambulate yesterday. Appetite not great. On exam, he has crackles R base with decreased air flow. Plan is to change ABX to PO, HD per renal is tomorrow. Follow plts - has been this low previously. Needs soc work consult as living in hotel, trouble affording meds (check comes the 23rd), needs [facility], med compliance. Possible D/C 15th or 16th.   Burns Spain, MD 12/23/2013, 11:21 AM

## 2013-12-23 NOTE — Progress Notes (Signed)
ANTIBIOTIC CONSULT NOTE - INITIAL  Pharmacy Consult for Levofloxacin and Vancomycin Indication: pneumonia  No Known Allergies  Patient Measurements: Height: 5\' 5"  (165.1 cm) Weight: 130 lb 12.8 oz (59.33 kg) IBW/kg (Calculated) : 61.5 Adjusted Body Weight:   Vital Signs: Temp: 97.9 F (36.6 C) (02/13 0650) Temp src: Oral (02/13 0650) BP: 130/79 mmHg (02/13 0905) Pulse Rate: 83 (02/13 0905) Intake/Output from previous day: 02/12 0701 - 02/13 0700 In: 960 [P.O.:960] Out: 3500  Intake/Output from this shift: Total I/O In: 477 [P.O.:477] Out: -   Labs:  Recent Labs  12/21/13 0255 12/22/13 0327 12/23/13 0620  WBC 9.0 8.9 6.9  HGB 11.0* 9.7* 9.7*  PLT 135* 89* 89*  CREATININE 7.74* 6.03* 4.18*   Estimated Creatinine Clearance: 16 ml/min (by C-G formula based on Cr of 4.18). No results found for this basename: VANCOTROUGH, Leodis Binet, VANCORANDOM, GENTTROUGH, GENTPEAK, GENTRANDOM, TOBRATROUGH, TOBRAPEAK, TOBRARND, AMIKACINPEAK, AMIKACINTROU, AMIKACIN,  in the last 72 hours   Microbiology: Recent Results (from the past 720 hour(s))  BODY FLUID CULTURE     Status: None   Collection Time    12/09/13  9:36 AM      Result Value Ref Range Status   Specimen Description ASCITIC ABDOMEN FLUID   Final   Special Requests FLUID   Final   Gram Stain     Final   Value: RARE WBC PRESENT,BOTH PMN AND MONONUCLEAR     NO ORGANISMS SEEN     Performed at [facility]   Culture     Final   Value: NO GROWTH 3 DAYS     Performed at [facility]   Report Status 12/12/2013 FINAL   Final  CULTURE, BLOOD (ROUTINE X 2)     Status: None   Collection Time    12/22/13  5:15 PM      Result Value Ref Range Status   Specimen Description BLOOD LEFT HAND   Final   Special Requests BOTTLES DRAWN AEROBIC ONLY 2 CC   Final   Culture  Setup Time     Final   Value: 12/22/2013 20:38     Performed at [facility]   Culture     Final   Value:        BLOOD CULTURE  RECEIVED NO GROWTH TO DATE CULTURE WILL BE HELD FOR 5 DAYS BEFORE ISSUING A FINAL NEGATIVE REPORT     Performed at [facility]   Report Status PENDING   Incomplete    Medical History: Past Medical History  Diagnosis Date  . COPD (chronic obstructive pulmonary disease)   . Hypertension   . Hep C w/ coma, chronic   . Irregular heartbeat   . ESRD (end stage renal disease) on dialysis     /notes 11/11/2013  . Smoker unmotivated to quit   . Active smoker   . PTSD (post-traumatic stress disorder)   . Shortness of breath     Medications:  Scheduled:  . amLODipine  5 mg Oral Daily  . aspirin EC  81 mg Oral Daily  . calcium acetate  1,334 mg Oral TID WC  . [START ON 12/24/2013] darbepoetin (ARANESP) injection - DIALYSIS  40 mcg Intravenous Q Sat-HD  . docusate sodium  100 mg Oral Daily  . doxercalciferol  2 mcg Intravenous Q T,Th,Sa-HD  . feeding supplement (NEPRO CARB STEADY)  237 mL Oral BID BM  . ferric gluconate (FERRLECIT/NULECIT) IV  125 mg Intravenous Q T,Th,Sa-HD  . folic acid  1 mg Oral Daily  . heparin  5,000 Units Subcutaneous 3 times per day  . latanoprost  1 drop Both Eyes QHS  . [START ON 12/25/2013] levofloxacin  500 mg Oral Q48H  . levofloxacin  750 mg Oral Once  . metoprolol  25 mg Oral BID  . multivitamin  1 tablet Oral QHS  . prazosin  4 mg Oral QHS  . traZODone  25 mg Oral QHS  . [START ON 12/24/2013] vancomycin  500 mg Intravenous Q T,Th,Sa-HD   Assessment: 58yo male with pneumonia, ESRD-TTS HD.  He received a loading dose of Vancomycin last PM, to add Levofloxacin (250mg  po q48 ordered).  Goal of Therapy:  pre-HD level 15-25  Plan:  1-  Vancomycin 500mg  IV qHD, next 2/14 2-  Change Levofloxacin to 750mg  po x 1, then 500mg  po q48 3-  F/U cx results 4-  SS Vancomycin level when appropriate  Marisue HumbleKendra Prestyn Mahn, PharmD Clinical Pharmacist [facility]

## 2013-12-23 NOTE — Clinical Social Work Note (Signed)
CSW received consult for medication management. CSW notified RNCM of consult due to [facility] working with pt's for medication management. CSW signing off. Please re consult if needed.  Darlyn Chamber, LCSWA Clinical Social Worker [phone]

## 2013-12-23 NOTE — Care Management Note (Signed)
    Page 1 of 1   12/23/2013     3:42:43 PM   CARE MANAGEMENT NOTE 12/23/2013  Patient:  Carl Benson, Carl Benson   Account Number:  [id number]  Date Initiated:  12/23/2013  Documentation initiated by:  Ronny Flurry  Subjective/Objective Assessment:     Action/Plan:   Anticipated DC Date:     Anticipated DC Plan:           Choice offered to / List presented to:             Status of service:   Medicare Important Message given?   (If response is "NO", the following Medicare IM given date fields will be blank) Date Medicare IM given:   Date Additional Medicare IM given:    Discharge Disposition:    Per UR Regulation:    If discussed at Long Length of Stay Meetings, dates discussed:    Comments:  12-23-13 Referral for medication assistance . Patient has medicare and [facility] .  [facility] at [facility] to [facility] them [facility] . Unable to provide further assistance . Ronny Flurry RN BSN [phone]

## 2013-12-23 NOTE — Progress Notes (Signed)
Subjective:  Sitting on side of bed, breathing slightly better, but weak  Objective: Vital signs in last 24 hours: Temp:  [97.9 F (36.6 C)-99.2 F (37.3 C)] 97.9 F (36.6 C) (02/13 0650) Pulse Rate:  [78-89] 78 (02/13 0650) Resp:  [18-20] 20 (02/13 0650) BP: (106-140)/(67-89) 124/80 mmHg (02/13 0650) SpO2:  [93 %-98 %] 95 % (02/13 0650) Weight:  [56.5 kg (124 lb 9 oz)-59.33 kg (130 lb 12.8 oz)] 59.33 kg (130 lb 12.8 oz) (02/13 0650) Weight change: -2.559 kg (-5 lb 10.3 oz)  Intake/Output from previous day: 02/12 0701 - 02/13 0700 In: 960 [P.O.:960] Out: 3500    Lab Results:  Recent Labs  12/22/13 0327 12/23/13 0620  WBC 8.9 6.9  HGB 9.7* 9.7*  HCT 27.1* 29.0*  PLT 89* 89*   BMET:  Recent Labs  12/22/13 0327 12/23/13 0620  NA 140 134*  K 4.2 4.5  CL 94* 95*  CO2 27 22  GLUCOSE 127* 96  BUN 35* 22  CREATININE 6.03* 4.18*  CALCIUM 8.3* 8.2*  ALBUMIN 2.5* 2.3*   No results found for this basename: PTH,  in the last 72 hours Iron Studies: No results found for this basename: IRON, TIBC, TRANSFERRIN, FERRITIN,  in the last 72 hours  EXAM: General appearance:  Alert, in no apparent distress Resp:  Mild rales at bases, but mostly clear Cardio:  RRR without murmur or rub GI:  + BS, distended, but soft and nontender Extremities:  Trace edema bilaterally Access:  AVG @ RUA with + bruit  Dialysis: TTS [facility]  4h 58.5kg 2K/2.25Bath Heparin 3000 RUE AVG  Hectorol 2 Epo 3800 Venofer 100/HD x 10, started 2/3  Last labs: Hb 10.5 tsat 22% ferritin 312 phos 10.1 pth 507  Assessment/Plan: 1. Dyspnea - RLL pneumonia with small effusion per CXR, on Cefepime & Vancomycin; volume overload resolved with HD. 2. ESRD - HD on TTS @ [facility], K 4.5.  Next HD tomorrow. 3. HTN/Volume - BP 124/80 on Metoprolol 25 mg bid; wt 59.3 kg s/p net UF 3.5 L yesterday. 4. Anemia - Hgb down to 9.7, Fe series (x 10) started 2/3.  Start Aranesp tomorrow. 5. 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 6. Nutrition - Alb 2.5 sec to liver disease, 7. Liver cirrhosis - ascites, sec to Hepatitis C. 8. Hx PAF - on BB. 9. Medical non adherence - living in hotel, [facility] [place] [facility].  [facility] to [facility] 3/6 @ 9 AM.   LOS: 2 days   LYLES,CHARLES 12/23/2013,8:43 AM  I have seen and examined patient, discussed with PA and agree with assessment and plan as outlined above.  Pt back to baseline. No vol excess, 1kg over dry wt.  Moderate ascites, not severe.  He has transportation arranged for outpatient HD ([facility] bus picking him up at [facility]) on TTS schedule, I gave him the schedule and the times he needs to be ready and the phone number of [employer] transportation, who are open 7 days a week.  Instructed that he can if he would like call [employer] the day prior to each HD to confirm he has a ride, at least until a stable pattern has been established.  [name] MD pager [phone]    cell [phone] 12/23/2013, 1:03 PM

## 2013-12-24 LAB — RENAL FUNCTION PANEL
Albumin: 2.4 g/dL — ABNORMAL LOW (ref 3.5–5.2)
BUN: 37 mg/dL — AB (ref 6–23)
CALCIUM: 8.8 mg/dL (ref 8.4–10.5)
CO2: 24 mEq/L (ref 19–32)
Chloride: 96 mEq/L (ref 96–112)
Creatinine, Ser: 6.02 mg/dL — ABNORMAL HIGH (ref 0.50–1.35)
GFR calc Af Amer: 11 mL/min — ABNORMAL LOW (ref >90)
GFR calc non Af Amer: 9 mL/min — ABNORMAL LOW (ref >90)
Glucose, Bld: 123 mg/dL — ABNORMAL HIGH (ref 70–99)
PHOSPHORUS: 5.1 mg/dL — AB (ref 2.3–4.6)
Potassium: 4.2 mEq/L (ref 3.7–5.3)
Sodium: 137 mEq/L (ref 137–147)

## 2013-12-24 LAB — CBC
HEMATOCRIT: 28.3 % — AB (ref 39.0–52.0)
Hemoglobin: 9.6 g/dL — ABNORMAL LOW (ref 13.0–17.0)
MCH: 26.2 pg (ref 26.0–34.0)
MCHC: 33.9 g/dL (ref 30.0–36.0)
MCV: 77.3 fL — AB (ref 78.0–100.0)
Platelets: 108 10*3/uL — ABNORMAL LOW (ref 150–400)
RBC: 3.66 MIL/uL — ABNORMAL LOW (ref 4.22–5.81)
RDW: 19 % — AB (ref 11.5–15.5)
WBC: 7.1 10*3/uL (ref 4.0–10.5)

## 2013-12-24 MED ORDER — PENTAFLUOROPROP-TETRAFLUOROETH EX AERO: 1.0000 "application " | INHALATION_SPRAY | CUTANEOUS | Status: DC | PRN

## 2013-12-24 MED ORDER — SODIUM CHLORIDE 0.9 % IV SOLN: 100.0000 mL | INTRAVENOUS | Status: DC | PRN

## 2013-12-24 MED ORDER — NEPRO/CARBSTEADY PO LIQD: 237.0000 mL | ORAL | Status: DC | PRN

## 2013-12-24 MED ORDER — TRAZODONE HCL 50 MG PO TABS
50.0000 mg | ORAL_TABLET | Freq: Every day | ORAL | Status: DC
  Administered 2013-12-24: 50 mg via ORAL
  Filled 2013-12-24 (×2): qty 1

## 2013-12-24 MED ORDER — HEPARIN SODIUM (PORCINE) 1000 UNIT/ML DIALYSIS
3000.0000 [IU] | Freq: Once | INTRAMUSCULAR | Status: AC
  Administered 2013-12-24: 3000 [IU] via INTRAVENOUS_CENTRAL

## 2013-12-24 MED ORDER — HEPARIN SODIUM (PORCINE) 1000 UNIT/ML DIALYSIS: 1000.0000 [IU] | INTRAMUSCULAR | Status: DC | PRN

## 2013-12-24 MED ORDER — DOXERCALCIFEROL 4 MCG/2ML IV SOLN
INTRAVENOUS | Status: AC
  Administered 2013-12-24: 2 ug via INTRAVENOUS
  Filled 2013-12-24: qty 2

## 2013-12-24 MED ORDER — AMLODIPINE BESYLATE 10 MG PO TABS
10.0000 mg | ORAL_TABLET | Freq: Every day | ORAL | Status: DC
  Administered 2013-12-25: 10 mg via ORAL
  Filled 2013-12-24: qty 1

## 2013-12-24 MED ORDER — DARBEPOETIN ALFA-POLYSORBATE 40 MCG/0.4ML IJ SOLN
INTRAMUSCULAR | Status: AC
  Administered 2013-12-24: 40 ug via INTRAVENOUS
  Filled 2013-12-24: qty 0.4

## 2013-12-24 MED ORDER — LIDOCAINE-PRILOCAINE 2.5-2.5 % EX CREA: 1.0000 "application " | TOPICAL_CREAM | CUTANEOUS | Status: DC | PRN

## 2013-12-24 MED ORDER — LIDOCAINE HCL (PF) 1 % IJ SOLN: 5.0000 mL | INTRAMUSCULAR | Status: DC | PRN

## 2013-12-24 MED ORDER — ZOLPIDEM TARTRATE 5 MG PO TABS
5.0000 mg | ORAL_TABLET | Freq: Every evening | ORAL | Status: DC | PRN
  Administered 2013-12-24: 5 mg via ORAL
  Filled 2013-12-24: qty 1

## 2013-12-24 MED ORDER — ALTEPLASE 2 MG IJ SOLR: 2.0000 mg | Freq: Once | INTRAMUSCULAR | Status: DC | PRN

## 2013-12-24 NOTE — Progress Notes (Signed)
Subjective:  Seen on dialysis, no complaints, hoping to go home  Objective: Vital signs in last 24 hours: Temp:  [97.5 F (36.4 C)-98.6 F (37 C)] 98.6 F (37 C) (02/14 0704) Pulse Rate:  [77-95] 79 (02/14 0734) Resp:  [15-20] 18 (02/14 0734) BP: (117-150)/(69-100) 143/90 mmHg (02/14 0734) SpO2:  [98 %-100 %] 98 % (02/14 0704) Weight:  [56.654 kg (124 lb 14.4 oz)-58.3 kg (128 lb 8.5 oz)] 58.3 kg (128 lb 8.5 oz) (02/14 0704) Weight change: -3.746 kg (-8 lb 4.1 oz)  Intake/Output from previous day: 02/13 0701 - 02/14 0700 In: 833 [P.O.:833] Out: -    Lab Results:  Recent Labs  12/22/13 0327 12/23/13 0620  WBC 8.9 6.9  HGB 9.7* 9.7*  HCT 27.1* 29.0*  PLT 89* 89*   BMET:  Recent Labs  12/22/13 0327 12/23/13 0620  NA 140 134*  K 4.2 4.5  CL 94* 95*  CO2 27 22  GLUCOSE 127* 96  BUN 35* 22  CREATININE 6.03* 4.18*  CALCIUM 8.3* 8.2*  ALBUMIN 2.5* 2.3*   No results found for this basename: PTH,  in the last 72 hours Iron Studies: No results found for this basename: IRON, TIBC, TRANSFERRIN, FERRITIN,  in the last 72 hours  EXAM:  General appearance: Alert, in no apparent distress  Resp: Mild rales at bases, but mostly clear  Cardio: RRR without murmur or rub  GI: + BS, distended, but soft and nontender  Extremities: Trace edema bilaterally  Access: AVG @ RUA with + bruit   Dialysis: TTS [facility]  4h 58.5kg 2K/2.25Bath Heparin 3000 RUE AVG  Hectorol 2 Epo 3800 Venofer 100/HD x 10, started 2/3  Last labs: Hb 10.5 tsat 22% ferritin 312 phos 10.1 pth 507  Assessment/Plan: 1. Dyspnea - RLL pneumonia with small effusion per CXR, on Vancomycin, started Levaquin; volume overload resolved with HD.  2. ESRD - HD on TTS @ [facility], K 4.5.  HD today.  3. HTN/Volume - BP 143/90 on Metoprolol 25 mg bid; wt 58.3 kg with UF goal 3 L today. Lower dry wt at discharge 4. Anemia - Hgb 9.7, Fe series (x 10) started 2/3, Aranesp 40 mcg today.  5. Sec HPT - Ca 8.2 (9.6 corrected);  Hectorol 2 mcg, Phoslo 2 with meals.  6. Nutrition - Alb 2.3 sec to liver disease.  7. Liver cirrhosis - ascites, sec to Hepatitis C.  8. Hx PAF - on BB.  9. Medical non adherence - living in hotel, [facility] [place] to [facility] rescheduled for 3/6 @ 9 AM; info provided for [employer] transportation to outpatient dialysis.     LOS: 3 days   Carl Benson,Carl Benson 12/24/2013,7:47 AM  I have seen and examined patient, discussed with PA and agree with assessment and plan as outlined above with additions as indicated. [name] MD pager [phone]    cell [phone] 12/24/2013, 11:19 AM

## 2013-12-24 NOTE — Progress Notes (Signed)
Pt labile in mood, agitated, irritable. Pt called nurse in and stated that he doesn't want to be in the hospital any longer, stated that he wants to see the MD now and if he hasn't seen one by 5 pm that he will leave AMA. Pt raised his voice and seemed aggressive at the time of conversation. Pt was easily de-escalated but seems very on edge.
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
## 2013-12-24 NOTE — Procedures (Signed)
I was present at this dialysis session, have reviewed the session itself and made  appropriate changes  [name] MD (pgr) [phone]    [phone] 12/24/2013, 11:20 AM

## 2013-12-24 NOTE — Progress Notes (Signed)
Dr. Mikey Bussing came by to see pt around 1525 and pt was not in his room. When pt returned to his room he demanded that he be discharged. 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 I am going home soon with or without the doctor."  Dr. Mikey Bussing was notified and pt will be signing out AMA.
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
## 2013-12-25 MED ORDER — LEVOFLOXACIN 500 MG PO TABS: 500.0000 mg | ORAL_TABLET | ORAL | Status: DC

## 2013-12-25 NOTE — Progress Notes (Signed)
   CARE MANAGEMENT NOTE 12/25/2013  Patient:  Carl Benson, Carl Benson   Account Number:  [id number]  Date Initiated:  12/23/2013  Documentation initiated by:  Ronny Flurry  Subjective/Objective Assessment:   adm: presents with a two-day history of abdominal swelling and scrotal edema     Action/Plan:   discharge planning   Anticipated DC Date:  12/25/2013   Anticipated DC Plan:  HOME/SELF CARE      DC Planning Services  CM consult      Choice offered to / List presented to:             Status of service:  Completed, signed off Medicare Important Message given?   (If response is "NO", the following Medicare IM given date fields will be blank) Date Medicare IM given:   Date Additional Medicare IM given:    Discharge Disposition:  HOME/SELF CARE  Per UR Regulation:    If discussed at Long Length of Stay Meetings, dates discussed:    Comments:  2/15 15 13:40 CM spoke with pt in room who states the only medication he doesnt have or can't afford is Levaquin.  Pt states he gets paid Tuesday 12/27/13 and can pay for it at that time.  CM explains his Levaquin is every other day. He has his med today; he skips tomorrow; and he can pay for it on Tuesday 12/27/13 when gets paid.  No other CM needs were communicated.  Freddy Jaksch, BSN, [place] [phone].  12-23-13 Referral for medication assistance . Patient [facility] and [facility] .  [facility] at [facility] to have them filled . Unable to provide further assistance . Ronny Flurry RN BSN [phone]

## 2013-12-25 NOTE — Progress Notes (Signed)
Attempted to give patient medication.  He asked if I had seen his doctor, as the doctor stated he could go home.  I stated that I had not spoken to the physician as of yet, and that the order had not yet been written, but that I would check on it.  Patient immediately became belligerent, shouting at me and stating that I "couldn't speak to him like that because (I) was white".  He continued to shout at me, pointing his finger close to my face.  I stated to him that I would return when he felt calm enough to be able to speak with me.  He followed me out of the room, shouting expletives, wanting to engage in an argument, which did not ensue.  Addressed with charge nurse.

## 2013-12-25 NOTE — Discharge Instructions (Signed)
1. Please schedule a follow up appointment for [facility]. Please continue to go to hemodialysis every Tuesday, Thursday, and Saturday.  2. Please take all medications as prescribed. Please take Levaquin every other day until none left (end date 01/03/14).  3. If you have worsening of your symptoms or new symptoms arise, please call the clinic [phone]), or go to the ER immediately if symptoms are severe.  You have done a great job in taking all your medications. I appreciate it very much. Please continue doing that.   Pneumonia, Adult Pneumonia is an infection of the lungs.  CAUSES Pneumonia may be caused by bacteria or a virus. Usually, these infections are caused by breathing infectious particles into the lungs (respiratory tract). SYMPTOMS   Cough.  Fever.  Chest pain.  Increased rate of breathing.  Wheezing.  Mucus production. DIAGNOSIS  If you have the common symptoms of pneumonia, your caregiver will typically confirm the diagnosis with a chest X-ray. The X-ray will show an abnormality in the lung (pulmonary infiltrate) if you have pneumonia. Other tests of your blood, urine, or sputum may be done to find the specific cause of your pneumonia. Your caregiver may also do tests (blood gases or pulse oximetry) to see how well your lungs are working. TREATMENT  Some forms of pneumonia may be spread to other people when you cough or sneeze. You may be asked to wear a mask before and during your exam. Pneumonia that is caused by bacteria is treated with antibiotic medicine. Pneumonia that is caused by the influenza virus may be treated with an antiviral medicine. Most other viral infections must run their course. These infections will not respond to antibiotics.  PREVENTION A pneumococcal shot (vaccine) is available to prevent a common bacterial cause of pneumonia. This is usually suggested for:  People over 60 years old.  Patients on chemotherapy.  People with  chronic lung problems, such as bronchitis or emphysema.  People with immune system problems. If you are over 65 or have a high risk condition, you may receive the pneumococcal vaccine if you have not received it before. In some countries, a routine influenza vaccine is also recommended. This vaccine can help prevent some cases of pneumonia.You may be offered the influenza vaccine as part of your care. If you smoke, it is time to quit. You may receive instructions on how to stop smoking. Your caregiver can provide medicines and counseling to help you quit. HOME CARE INSTRUCTIONS   Cough suppressants may be used if you are losing too much rest. However, coughing protects you by clearing your lungs. You should avoid using cough suppressants if you can.  Your caregiver may have prescribed medicine if he or she thinks your pneumonia is caused by a bacteria or influenza. Finish your medicine even if you start to feel better.  Your caregiver may also prescribe an expectorant. This loosens the mucus to be coughed up.  Only take over-the-counter or prescription medicines for pain, discomfort, or fever as directed by your caregiver.  Do not smoke. Smoking is a common cause of bronchitis and can contribute to pneumonia. If you are a smoker and continue to smoke, your cough may last several weeks after your pneumonia has cleared.  A cold steam vaporizer or humidifier in your room or home may help loosen mucus.  Coughing is often worse at night. Sleeping in a semi-upright position in a recliner or using a couple pillows under your head will help with  this.  Get rest as you feel it is needed. Your body will usually let you know when you need to rest. SEEK IMMEDIATE MEDICAL CARE IF:   Your illness becomes worse. This is especially true if you are elderly or weakened from any other disease.  You cannot control your cough with suppressants and are losing sleep.  You begin coughing up blood.  You  develop pain which is getting worse or is uncontrolled with medicines.  You have a fever.  Any of the symptoms which initially brought you in for treatment are getting worse rather than better.  You develop shortness of breath or chest pain. MAKE SURE YOU:   Understand these instructions.  Will watch your condition.  Will get help right away if you are not doing well or get worse. Document Released: 10/27/2005 Document Revised: 01/19/2012 Document Reviewed: 01/16/2011 [facility] Patient Information 2014 [place], [place].
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
## 2013-12-25 NOTE — Progress Notes (Signed)
Subjective:  Feeling "great", no complaints, ready to go home  Objective: Vital signs in last 24 hours: Temp:  [97.9 F (36.6 C)-98.4 F (36.9 C)] 97.9 F (36.6 C) (02/15 0530) Pulse Rate:  [82-98] 98 (02/15 0530) Resp:  [16-20] 16 (02/15 0530) BP: (147-169)/(93-106) 148/96 mmHg (02/15 0530) SpO2:  [96 %-97 %] 97 % (02/15 0530) Weight:  [55.3 kg (121 lb 14.6 oz)-55.4 kg (122 lb 2.2 oz)] 55.3 kg (121 lb 14.6 oz) (02/15 0530) Weight change: 1.646 kg (3 lb 10.1 oz)  Intake/Output from previous day: 02/14 0701 - 02/15 0700 In: 245 [P.O.:245] Out: 2990    Lab Results:  Recent Labs  12/23/13 0620 12/24/13 0727  WBC 6.9 7.1  HGB 9.7* 9.6*  HCT 29.0* 28.3*  PLT 89* 108*   BMET:  Recent Labs  12/23/13 0620 12/24/13 0726  NA 134* 137  K 4.5 4.2  CL 95* 96  CO2 22 24  GLUCOSE 96 123*  BUN 22 37*  CREATININE 4.18* 6.02*  CALCIUM 8.2* 8.8  ALBUMIN 2.3* 2.4*   No results found for this basename: PTH,  in the last 72 hours Iron Studies: No results found for this basename: IRON, TIBC, TRANSFERRIN, FERRITIN,  in the last 72 hours  EXAM:  General appearance: Alert, in no apparent distress  Resp: CTA without rales, rhonchi, or wheezes Cardio: RRR without murmur or rub  GI: + BS, distended, but soft and nontender  Extremities:  No edema  Access: AVG @ RUA with + bruit   Dialysis: TTS [facility]  4h 58.5kg 2K/2.25[place] Heparin 3000 RUE AVG  Hectorol 2 Epo 3800 Venofer 100/HD x 10, started 2/3  Last labs: Hb 10.5 tsat 22% ferritin 312 phos 10.1 pth 507  Assessment/Plan: 1. Dyspnea - RLL pneumonia with small effusion per CXR 2/12, started Levaquin PO; volume overload resolved with HD.  2. ESRD - HD on TTS @ [facility], K 4.2.  Next HD 2/17.  3. HTN/Volume - BP 148/96 on Metoprolol 25 mg bid; wt 55.3 kg s/p net UF 3 L yesterday. Lower dry wt at discharge.  4. Anemia - Hgb 9.6, Fe series (x 10) started 2/3, Aranesp 40 mcg today.  5. Sec HPT - Ca 8.8 (10.1 corrected), P 5.1; Hectorol  2 mcg, Phoslo 2 with meals.  6. Nutrition - Alb 2.4 sec to liver disease.  7. Liver cirrhosis - ascites, sec to Hepatitis C.  8. Hx PAF - on BB.  9. Medical non-adherence - living in hotel, [facility] [place] [facility] rescheduled for 3/6 @ 9 AM; info provided for [facility] transportation to outpatient dialysis     LOS: 4 days   Carl Benson,Carl Benson 12/25/2013,9:39 AM   I have seen and examined patient, discussed with PA and agree with assessment and plan as outlined above.  Pt ok for discharge from renal standpoint. If he is not discharged will plan on extra HD tomorrow to get dry weight down further.  Carl Moselle MD pager [phone]    cell [phone] 12/25/2013, 1:17 PM

## 2013-12-25 NOTE — Progress Notes (Signed)
MD informed of patient's behavior.  Pt. left floor - unwitnessed departure.  Security present to maintain.  Whereabouts of patient unknown.  Will attempt to call Care Management for discharge issues with medication

## 2013-12-25 NOTE — Discharge Summary (Signed)
Name: Carl Benson MRN: [id number] DOB: May 18, 1956 58 y.o. PCP: No Pcp Per Patient  Date of Admission: 12/21/2013 12:22 AM Date of Discharge: 12/25/2013 Attending Physician: Josem Kaufmann  Discharge Diagnosis: 1. HCAP 2. ESRD on HD (TTS) 3. HTN 4. Microcytic Anemia 5. Thrombocytopenia 6. Paroxysmal A-fib  Discharge Medications:   Medication List         albuterol 108 (90 BASE) MCG/ACT inhaler  Commonly known as:  PROVENTIL HFA;VENTOLIN HFA  Inhale 1-2 puffs into the lungs every 6 (six) hours as needed for wheezing or shortness of breath.     amLODipine 10 MG tablet  Commonly known as:  NORVASC  Take 1 tablet (10 mg total) by mouth daily.     aspirin 81 MG EC tablet  Take 1 tablet (81 mg total) by mouth daily.     b complex-vitamin c-folic acid 0.8 MG Tabs tablet  Take 1 tablet by mouth at bedtime.     calcium acetate 667 MG capsule  Commonly known as:  PHOSLO  Take 2 capsules (1,334 mg total) by mouth 3 (three) times daily with meals.     calcium carbonate 750 MG chewable tablet  Commonly known as:  TUMS EX  Chew 2 tablets by mouth daily as needed for heartburn.     diphenhydrAMINE 25 MG tablet  Commonly known as:  BENADRYL  Take 1 tablet (25 mg total) by mouth every 6 (six) hours.     docusate sodium 100 MG capsule  Commonly known as:  COLACE  Take 1 capsule (100 mg total) by mouth daily.     folic acid 1 MG tablet  Commonly known as:  FOLVITE  Take 1 tablet (1 mg total) by mouth daily.     hydrOXYzine 25 MG tablet  Commonly known as:  ATARAX/VISTARIL  Take 1 tablet (25 mg total) by mouth every 6 (six) hours.     latanoprost 0.005 % ophthalmic solution  Commonly known as:  XALATAN  Place 1 drop into both eyes at bedtime. Affected eye     levofloxacin 500 MG tablet  Commonly known as:  LEVAQUIN  Take 1 tablet (500 mg total) by mouth every other day.     metoprolol 50 MG tablet  Commonly known as:  LOPRESSOR  Take 1 tablet (50 mg total) by mouth 2 (two)  times daily.     prazosin 2 MG capsule  Commonly known as:  MINIPRESS  Take 2 capsules (4 mg total) by mouth at bedtime.     traZODone 25 mg Tabs tablet  Commonly known as:  DESYREL  Take 0.5 tablets (25 mg total) by mouth at bedtime.       Disposition and follow-up:   Carl Benson was discharged from [facility] in Stable condition.  At the hospital follow up visit please address:  1.  SOB, cough, chest pain, abdominal distension, compliance w/ HD as well as full course of ABx. Has patient found stable living situation? [facility]?   2.  Labs / imaging needed at time of follow-up: CBC, BMP, CXR in 4-6 weeks for assessment of consolidation previously seen in RLL. Patient should have outpatient colonoscopy for evaluation of chronic microcytic anemia.   3.  Pending labs/ test needing follow-up: none  Follow-up Appointments:     Follow-up Information   Follow up with [facility].   Specialty:  General Practice   Contact information:   [address] [place] [address] [phone]  Discharge Instructions: Discharge Orders   Future Orders Complete By Expires   Call MD for:  difficulty breathing, headache or visual disturbances  As directed    Call MD for:  temperature >100.4  As directed       Consultations: Treatment Team:  [name] Schertz, MD  Procedures Performed:  Dg Chest 2 View  12/22/2013   CLINICAL DATA:  Re-evaluate pneumonia  EXAM: CHEST  2 VIEW  COMPARISON:  DG CHEST 2 VIEW dated 12/21/2013  FINDINGS: Stable cardiomegaly. Vascular pattern normal. Left lung clear. On the right, there is expanded and more conspicuous consolidation with small new effusion. Area of consolidation measures approximately 5 cm in greatest diameter.  IMPRESSION: Right lower lobe pneumonia with small associated effusion worse when compared to the prior study.   Electronically Signed   By: Esperanza Heiraymond  Rubner M.D.   On: 12/22/2013 15:08    Dg Chest 2 View  12/21/2013   CLINICAL DATA:  Shortness of breath, cough  EXAM: CHEST  2 VIEW  COMPARISON:  December 19, 2013  FINDINGS: The heart size and mediastinal contours are stable. The heart size is enlarged. There is no pulmonary edema. Patchy consolidation of right lung base is noted. There is a small posterior pleural effusion. The visualized skeletal structures are stable.  IMPRESSION: Right lung base pneumonia.  Cardiomegaly.   Electronically Signed   By: Sherian ReinWei-Chen  Lin M.D.   On: 12/21/2013 02:42   Admission HPI:  Carl Benson is a 58 yo man with history of HTN, Hep C, COPD & ESRD on HD who presents with gradually worsening SOB since the day prior to admission - he reports symptoms worsened around 1AM and he called EMS. Symptoms are similar to when he has missed HD in the past - he dialyzes on TThS, but missed HD Tuesday prior to admission, reporting no one picked him up. He is a poor historian, and told the EDP he was here with abdominal pain. He does report abdominal swelling. Patient very irritable, refusing to answer several questions, but does deny chest pain, palpitations, fever and chills. He reports a dry cough. He was eager to eat. Reports constipation, and last BM day prior.  To note, in the ED around 5:15a, it was noted that his SpO2 dropped to 83% and he was placed on 2L [place], and rebounded to 96%. He was subsequently treated with Levofloxacin. Prior to this, he was treated with zofran & hydroxyzine.  He most recently presented to the ED no 12/19/13 and underwent therapeutic paracentesis (drainage of 1L of clear straw ascitic fluid). He was most recently admitted 12/11/13-12/13/13 with abdominal & groin swelling, during which admission he underwent HD and left AMA. During that admission, abdominal US was ordered for concern of portal vein thrombosis, but patient left before this could be done. He also left AMA at 11/20/13 admission.  Hospital Course by problem list:   1. HCAP- Patient w/ SOB  2-3 days prior to admission, w/ associated dry cough and abdominal distension. Receives HD TTS, missed his HD on Tuesday prior to admission because no one came to pick him up, according to the patient. CXR on admission suggestive for RLL infiltrate vs effusion. Patient had decrease in SpO2 on RA in the ED to 80's, but rebounded on O2 via . Given one dose of Levaquin on admission. No leukocytosis initially, WBC's 9.0. Given non-compliance with HD and previous h/o of volume overlaod, this was primary diagnosis initially, given absence of fever, leukocytosis, or productive  cough. No wheezing on exam, unlikely COPD exacerbation, Well's score 0 w/ low probability of PE. On 12/22/13, patient received HD, and CXR was repeated after HD, which showed worsened RLL infiltrate w/ associated pleural effusion. D/t recent hospitalization, patient was started on Vancomycin and Cefepime on 12/22/13, changed to Levaquin 250 mg po q48h given h/o leaving AMA. Blood cultures found to be negative, HIV also negative. Continued to be w/out leukocytosis, SOB and cough improved throughout admission. Discharged w/ Levaquin as dose described above, for total of 14 day course.   2. ESRD on HD (TTS)- Patient presented w/ abdominal distension, likely 2/2 volume overload due to ESRD and poor compliance w/ HD. Has HD at [facility], however, missed HD Tuesday prior to admission. Continued renavite, calcium carbonate, and hydroxyzine prn for pruritis.  3. HTN- Stable during most of admission, continued home meds; Amlodipine, Metoprolol, and Prazosin (patient unable to identify if for BPH vs HTN). After HD on 12/24/13, BP found to be elevated, increased Amlodipine from 5 to 10 mg po qd, continued on discharge as BP remained stable on this dose.   4. Microcytic Anemia- Hb at baseline throughout admission, anemia likely 2/2 ESRD. Previous Iron studies wnl. Recommend outpatient colonoscopy.  5. Thrombocytopenia- Baseline range from 94-172, likely  due to cirrhosis. On Heparin [place] for DVT ppx. No signs of bleeding and no concern for HIT.  6. Paroxysmal A-fib- Remained in NSR during admission. A-fib w/ RVR during January admission, requiring Dilt gtt. CHADS score of 1, based on known history. Not on Coumadin, poor candidate d/t compliance issues. Continued Metoprolol and ASA.  Discharge Vitals:   BP 148/96  Pulse 98  Temp(Src) 97.9 F (36.6 C) (Axillary)  Resp 16  Ht 5\' 5"  (1.651 m)  Wt 121 lb 14.6 oz (55.3 kg)  BMI 20.29 kg/m2  SpO2 97%  Discharge Labs:  No results found for this or any previous visit (from the past 24 hour(s)).  Signed: Courtney Paris, MD 12/25/2013, 2:02 PM   Time Spent on Discharge: 45 minutes Services Ordered on Discharge: none Equipment Ordered on Discharge: none

## 2013-12-28 ENCOUNTER — Encounter (HOSPITAL_COMMUNITY): Payer: Self-pay | Admitting: Emergency Medicine

## 2013-12-28 ENCOUNTER — Emergency Department (HOSPITAL_COMMUNITY)
Admission: EM | Admit: 2013-12-28 | Discharge: 2013-12-28 | Disposition: A | Discharge status: Home / Self Care | Payer: Medicare Other | Source: Home / Self Care | Attending: Emergency Medicine | Admitting: Emergency Medicine

## 2013-12-28 DIAGNOSIS — Z79899 Other long term (current) drug therapy: Secondary | ICD-10-CM | POA: Insufficient documentation

## 2013-12-28 DIAGNOSIS — I120 Hypertensive chronic kidney disease with stage 5 chronic kidney disease or end stage renal disease: Secondary | ICD-10-CM | POA: Insufficient documentation

## 2013-12-28 DIAGNOSIS — F17200 Nicotine dependence, unspecified, uncomplicated: Secondary | ICD-10-CM | POA: Insufficient documentation

## 2013-12-28 DIAGNOSIS — Z8719 Personal history of other diseases of the digestive system: Secondary | ICD-10-CM | POA: Insufficient documentation

## 2013-12-28 DIAGNOSIS — R188 Other ascites: Secondary | ICD-10-CM

## 2013-12-28 DIAGNOSIS — Z7982 Long term (current) use of aspirin: Secondary | ICD-10-CM | POA: Insufficient documentation

## 2013-12-28 DIAGNOSIS — Z8619 Personal history of other infectious and parasitic diseases: Secondary | ICD-10-CM | POA: Insufficient documentation

## 2013-12-28 DIAGNOSIS — Z91199 Patient's noncompliance with other medical treatment and regimen due to unspecified reason: Secondary | ICD-10-CM | POA: Insufficient documentation

## 2013-12-28 DIAGNOSIS — R142 Eructation: Secondary | ICD-10-CM | POA: Insufficient documentation

## 2013-12-28 DIAGNOSIS — Z8659 Personal history of other mental and behavioral disorders: Secondary | ICD-10-CM | POA: Insufficient documentation

## 2013-12-28 DIAGNOSIS — J4489 Other specified chronic obstructive pulmonary disease: Secondary | ICD-10-CM | POA: Insufficient documentation

## 2013-12-28 DIAGNOSIS — R143 Flatulence: Secondary | ICD-10-CM

## 2013-12-28 DIAGNOSIS — Z992 Dependence on renal dialysis: Secondary | ICD-10-CM | POA: Insufficient documentation

## 2013-12-28 DIAGNOSIS — N186 End stage renal disease: Secondary | ICD-10-CM | POA: Insufficient documentation

## 2013-12-28 DIAGNOSIS — R141 Gas pain: Secondary | ICD-10-CM | POA: Insufficient documentation

## 2013-12-28 DIAGNOSIS — Z9119 Patient's noncompliance with other medical treatment and regimen: Secondary | ICD-10-CM | POA: Insufficient documentation

## 2013-12-28 DIAGNOSIS — J449 Chronic obstructive pulmonary disease, unspecified: Secondary | ICD-10-CM | POA: Insufficient documentation

## 2013-12-28 LAB — CULTURE, BLOOD (ROUTINE X 2): Culture: NO GROWTH

## 2013-12-28 MED ORDER — DIPHENHYDRAMINE HCL 25 MG PO CAPS
25.0000 mg | ORAL_CAPSULE | Freq: Once | ORAL | Status: AC
Start: 2013-12-28 — End: 2013-12-28
  Administered 2013-12-28: 25 mg via ORAL
  Filled 2013-12-28: qty 1

## 2013-12-28 NOTE — Discharge Instructions (Signed)
Ascites °Ascites is a gathering of fluid in the belly (abdomen). This is most often caused by liver disease. It may also be caused by a number of other less common problems. It causes a ballooning out (distension) of the abdomen. °CAUSES  °Scarring of the liver (cirrhosis) is the most common cause of ascites. Other causes include: °· Infection or inflammation in the abdomen. °· Cancer in the abdomen. °· Heart failure. °· Certain forms of kidney failure (nephritic syndrome). °· Inflammation of the pancreas. °· Clots in the veins of the liver. °SYMPTOMS  °In the early stages of ascites, you may not have any symptoms. The main symptom of ascites is a sense of abdominal bloating. This is due to the presence of fluid. This may also cause an increase in abdominal or waist size. People with this condition can develop swelling in the legs, and men can develop a swollen scrotum. When there is a lot of fluid, it may be hard to breath. Stretching of the abdomen by fluid can be painful. °DIAGNOSIS  °Certain features of your medical history, such as a history of liver disease and of an enlarging abdomen, can suggest the presence of ascites. The diagnosis of ascites can be made on physical exam by your caregiver. An abdominal ultrasound examination can confirm that ascites is present, and estimate the amount of fluid. °Once ascites is confirmed, it is important to determine its cause. Again, a history of one of the conditions listed in "CAUSES" provides a strong clue. A physical exam is important, and blood and X-ray tests may be needed. During a procedure called paracentesis, a sample of fluid is removed from the abdomen. This can determine certain key features about the fluid, such as whether or not infection or cancer is present. Your caregiver will determine if a paracentesis is necessary. They will describe the procedure to you. °PREVENTION  °Ascites is a complication of other conditions. Therefore to prevent ascites, you  must seek treatment for any significant health conditions you have. Once ascites is present, careful attention to fluid and salt intake may help prevent it from getting worse. If you have ascites, you should not drink alcohol. °PROGNOSIS  °The prognosis of ascites depends on the underlying disease. If the disease is reversible, such as with certain infections or with heart failure, then ascites may improve or disappear. When ascites is caused by cirrhosis, then it indicates that the liver disease has worsened, and further evaluation and treatment of the liver disease is needed. If your ascites is caused by cancer, then the success or failure of the cancer treatment will determine whether your ascites will improve or worsen. °RISKS AND COMPLICATIONS  °Ascites is likely to worsen if it is not properly diagnosed and treated. A large amount of ascites can cause pain and difficulty breathing. The main complication, besides worsening, is infection (called spontaneous bacterial peritonitis). This requires prompt treatment. °TREATMENT  °The treatment of ascites depends on its cause. When liver disease is your cause, medical management using water pills (diuretics) and decreasing salt intake is often effective. Ascites due to peritoneal inflammation or malignancy (cancer) alone does not respond to salt restriction and diuretics. Hospitalization is sometimes required. °If the treatment of ascites cannot be managed with medications, a number of other treatments are available. Your caregivers will help you decide which will work best for you. Some of these are: °· Removal of fluid from the abdomen (paracentesis). °· Fluid from the abdomen is passed into a vein (peritoneovenous shunting). °·   Liver transplantation. °· Transjugular intrahepatic portosystemic stent shunt. °HOME CARE INSTRUCTIONS  °It is important to monitor body weight and the intake and output of fluids. Weigh yourself at the same time every day. Record your  weights. Fluid restriction may be necessary. It is also important to know your salt intake. The more salt you take in, the more fluid you will retain. Ninety percent of people with ascites respond to this approach. °· Follow any directions for medicines carefully. °· Follow up with your caregiver, as directed. °· Report any changes in your health, especially any new or worsening symptoms. °· If your ascites is from liver disease, avoid alcohol and other substances toxic to the liver. °SEEK MEDICAL CARE IF:  °· Your weight increases more than a few pounds in a few days. °· Your abdominal or waist size increases. °· You develop swelling in your legs. °· You had swelling and it worsens. °SEEK IMMEDIATE MEDICAL CARE IF:  °· You develop a fever. °· You develop new abdominal pain. °· You develop difficulty breathing. °· You develop confusion. °· You have bleeding from the mouth, stomach, or rectum. °MAKE SURE YOU:  °· Understand these instructions. °· Will watch your condition. °· Will get help right away if you are not doing well or get worse. °Document Released: 10/27/2005 Document Revised: 01/19/2012 Document Reviewed: 05/28/2007 °ExitCare® Patient Information ©2014 [employer]. ° °

## 2013-12-28 NOTE — ED Notes (Signed)
Pt states that he is itchy, which he reports always happens after dialysis.

## 2013-12-28 NOTE — ED Provider Notes (Signed)
CSN: [id number]     Arrival date & time 12/28/13  1919 History   First MD Initiated Contact with Patient 12/28/13 2035     Chief Complaint  Patient presents with  . Abdominal Pain   HPI Comments: 58 yo M hx of COPD, HTN, Hep C w/ cirrhosis, ESRD on HD TRS, recent admission for HCAP 2/11 presents with CC of abdominal distension.  Pt states symptoms started today.  He reports he has recurrent abdominal distension 2/2 ascites, and pt has had multiple ED visits for this in the past year.  Pt states abdomen is diffusely tight with mild pain 2/2 distention.  He denies fever, chills, CP, SOB, nausea, vomiting, diarrhea, constipation, myalgias, rash, or any other symptoms.  Pt states symptoms typically improve with paracentesis, which was last done on 2/9 in ED with 1 L ascitic fluid removed at that time.  Pt also had recent admission for HCAP 2/11 discharged 3 days ago.  He states his breathing has significantly improved since d/c.  Pt has hx of medical noncompliance, and poor follow up as outpt, as suggestive by multiple ED visits.  He has [facility], however, he states he has not been there "in a while".  Pt does not have a primary care provider here in [place].  Pt does get dialysis on a regular basis, last occurrence yesterday, and he received full dialysis at that time.    Patient is a 58 y.o. male presenting with abdominal pain.  Abdominal Pain Pain location:  Generalized Pain quality: pressure   Pain radiates to:  Does not radiate Pain severity:  Mild Onset quality:  Gradual Duration:  1 day Timing:  Constant Progression:  Unchanged Chronicity:  Recurrent Context: recent illness   Relieved by:  Nothing Worsened by:  Nothing tried Ineffective treatments:  None tried Associated symptoms: no chest pain, no chills, no constipation, no cough, no diarrhea, no dysuria, no fever, no flatus, no hematemesis, no hematochezia, no hematuria, no melena, no nausea, no shortness of  breath and no vomiting   Risk factors: recent hospitalization   Risk factors: no alcohol abuse, no aspirin use, not elderly, has not had multiple surgeries, no NSAID use, not obese and not pregnant   Risk factors comment:  Cirrhosis   Past Medical History  Diagnosis Date  . COPD (chronic obstructive pulmonary disease)   . Hypertension   . Hep C w/ coma, chronic   . Irregular heartbeat   . ESRD (end stage renal disease) on dialysis     /notes 11/11/2013  . Smoker unmotivated to quit   . Active smoker   . PTSD (post-traumatic stress disorder)   . Shortness of breath    Past Surgical History  Procedure Laterality Date  . Paracentesis  ~ 10/2013    [name]/notes 11/11/2013   Family History  Problem Relation Age of Onset  . Heart disease Mother   . Hypertension Mother   . Diabetes Mother    History  Substance Use Topics  . Smoking status: Current Every Day Smoker -- 0.50 packs/day for 15 years    Types: Cigarettes  . Smokeless tobacco: Never Used  . Alcohol Use: No     Comment: Becomes agitated when asked about EtOH use    Review of Systems  Constitutional: Negative for fever and chills.  Respiratory: Negative for cough and shortness of breath.   Cardiovascular: Negative for chest pain.  Gastrointestinal: Positive for abdominal pain and abdominal distention. Negative for  nausea, vomiting, diarrhea, constipation, melena, hematochezia, flatus and hematemesis.  Genitourinary: Negative for dysuria, hematuria, flank pain and testicular pain.  Musculoskeletal: Negative for myalgias.  Skin: Negative for rash.  Neurological: Negative for dizziness, weakness, light-headedness, numbness and headaches.  All other systems reviewed and are negative.      Allergies  Review of patient's allergies indicates no known allergies.  Home Medications   Current Outpatient Rx  Name  Route  Sig  Dispense  Refill  . albuterol (PROVENTIL HFA;VENTOLIN HFA) 108 (90 BASE) MCG/ACT inhaler    Inhalation   Inhale 1-2 puffs into the lungs every 6 (six) hours as needed for wheezing or shortness of breath.         Marland Kitchen amLODipine (NORVASC) 10 MG tablet   Oral   Take 10 mg by mouth daily.         Marland Kitchen aspirin EC 81 MG tablet   Oral   Take 81 mg by mouth daily.         Marland Kitchen b complex-vitamin c-folic acid (NEPHRO-VITE) 0.8 MG TABS tablet   Oral   Take 1 tablet by mouth daily.         . calcium acetate (PHOSLO) 667 MG capsule   Oral   Take 1,334 mg by mouth 3 (three) times daily with meals.         . folic acid (FOLVITE) 1 MG tablet   Oral   Take 1 mg by mouth daily.         . hydrOXYzine (ATARAX/VISTARIL) 25 MG tablet   Oral   Take 25 mg by mouth every 6 (six) hours.         . metoprolol (LOPRESSOR) 50 MG tablet   Oral   Take 50 mg by mouth 2 (two) times daily.         . prazosin (MINIPRESS) 2 MG capsule   Oral   Take 4 mg by mouth at bedtime.         . traZODone (DESYREL) 50 MG tablet   Oral   Take 50 mg by mouth at bedtime.          BP 159/112  Pulse 106  Temp(Src) 98 F (36.7 C) (Oral)  Resp 26  SpO2 98% Physical Exam  Nursing note and vitals reviewed. Constitutional: He is oriented to person, place, and time. He appears well-developed and well-nourished.  HENT:  Head: Normocephalic and atraumatic.  Right Ear: External ear normal.  Left Ear: External ear normal.  Mouth/Throat: Oropharynx is clear and moist.  Eyes: Conjunctivae and EOM are normal. Pupils are equal, round, and reactive to light.  Neck: Normal range of motion. Neck supple.  Cardiovascular: Normal rate, regular rhythm, normal heart sounds and intact distal pulses.   Pulmonary/Chest: Effort normal and breath sounds normal. No respiratory distress. He has no wheezes. He has no rales. He exhibits no tenderness.  Abdominal: Soft. Bowel sounds are normal. He exhibits distension. He exhibits no mass. There is tenderness. There is no rebound and no guarding.  Pt with distended  abdomen, with fluid wave.  Abdomen is mildly TTP, but no evidence of peritonitis as abdomen is soft, no guarding, no rebound.    Musculoskeletal: Normal range of motion.  Neurological: He is alert and oriented to person, place, and time.  Skin: Skin is warm and dry.    ED Course  Procedures (including critical care time) Labs Review Labs Reviewed - No data to display Imaging Review No results found.  EKG  Interpretation   None       MDM   Final diagnoses:  Ascites   58 yo M hx of COPD, HTN, Hep C w/ cirrhosis, ESRD on HD TRS, recent admission for HCAP 2/11 presents with CC of abdominal distension.  Filed Vitals:   12/28/13 2204  BP: 159/112  Pulse: 106  Temp:   Resp: 26   Physical exam as above.  Pt with abdominal distension with fluid wave, with no signs of peritonitis, and only mild diffuse TTP which pt states is typical for him.  Pt is afebrile here.  No other concerning features suggestive of SBP by history or exam.  And low suspicion for any other abdominal processes at this time.     Pt asking for therapeutic paracentesis.  It was related to pt that we would not be performing that today as it is not an emergent procedure.  Internal medicine consulted, and evaluated pt in ED.  They have spoken with pt, and myself about follow up options for pt.  They have discovered that pt has appointment with [facility] on 3/6, and also suggest that pt may f/u with [facility] here as well.  At time of f/u, pt may schedule therapeutic paracentesis as outpt.    Pt understands and agrees with plan.  Pt given benadryl for c/o "itching" before d/c.    Pt to be d/c home in good condition.  Encouraged to continue supportive care.  [facility], and [facility] as [facility].  Return precautions given.  Pt understands and agrees with plan.  I have discussed pt's care plan with Dr. Bebe Shaggy.  Jon Gills, MD    Jon Gills, MD 12/29/13 [phone]

## 2013-12-28 NOTE — ED Notes (Signed)
Pt denies n/v/d 

## 2013-12-28 NOTE — ED Notes (Signed)
Pt states he feels like he is neglected and that the doctor is avoiding him. Pt states he wants to go home. Pt reassured and MD notified.

## 2013-12-28 NOTE — ED Notes (Signed)
Pt BIB EMS. Pt had dialysis today, and afterwards, reported abdominal pain and distension. Pt states that this always happens after dialysis, and for treatment, he usually has more dialysis or a paracentesis. Pt states that the cause is unknown. Pt rates pain to be 10/10. Last BP with EMS was 160/100. Pt with hx of hypertension, and is non-compliant with medications. Fistula is in right arm.

## 2013-12-29 LAB — CULTURE, BLOOD (ROUTINE X 2): CULTURE: NO GROWTH

## 2013-12-30 ENCOUNTER — Inpatient Hospital Stay (HOSPITAL_COMMUNITY)
Admission: EM | Admit: 2013-12-30 | Discharge: 2014-01-02 | DRG: 388 | Disposition: A | Discharge status: Home / Self Care | Payer: Medicare Other | Attending: Internal Medicine | Admitting: Internal Medicine

## 2013-12-30 ENCOUNTER — Other Ambulatory Visit: Payer: Self-pay

## 2013-12-30 ENCOUNTER — Encounter (HOSPITAL_COMMUNITY): Payer: Self-pay | Admitting: Emergency Medicine

## 2013-12-30 ENCOUNTER — Emergency Department (HOSPITAL_COMMUNITY): Payer: Medicare Other

## 2013-12-30 ENCOUNTER — Emergency Department (HOSPITAL_COMMUNITY)
Admission: EM | Admit: 2013-12-30 | Discharge: 2013-12-30 | Disposition: A | Discharge status: Home / Self Care | Payer: Medicare Other | Attending: Emergency Medicine | Admitting: Emergency Medicine

## 2013-12-30 DIAGNOSIS — R238 Other skin changes: Secondary | ICD-10-CM | POA: Insufficient documentation

## 2013-12-30 DIAGNOSIS — Z79899 Other long term (current) drug therapy: Secondary | ICD-10-CM

## 2013-12-30 DIAGNOSIS — E162 Hypoglycemia, unspecified: Secondary | ICD-10-CM | POA: Diagnosis present

## 2013-12-30 DIAGNOSIS — M949 Disorder of cartilage, unspecified: Secondary | ICD-10-CM

## 2013-12-30 DIAGNOSIS — Z7982 Long term (current) use of aspirin: Secondary | ICD-10-CM | POA: Insufficient documentation

## 2013-12-30 DIAGNOSIS — F4310 Post-traumatic stress disorder, unspecified: Secondary | ICD-10-CM | POA: Diagnosis present

## 2013-12-30 DIAGNOSIS — K56600 Partial intestinal obstruction, unspecified as to cause: Secondary | ICD-10-CM | POA: Diagnosis present

## 2013-12-30 DIAGNOSIS — Z8619 Personal history of other infectious and parasitic diseases: Secondary | ICD-10-CM | POA: Insufficient documentation

## 2013-12-30 DIAGNOSIS — J4489 Other specified chronic obstructive pulmonary disease: Secondary | ICD-10-CM | POA: Diagnosis present

## 2013-12-30 DIAGNOSIS — K56609 Unspecified intestinal obstruction, unspecified as to partial versus complete obstruction: Principal | ICD-10-CM | POA: Diagnosis present

## 2013-12-30 DIAGNOSIS — D696 Thrombocytopenia, unspecified: Secondary | ICD-10-CM | POA: Diagnosis present

## 2013-12-30 DIAGNOSIS — I120 Hypertensive chronic kidney disease with stage 5 chronic kidney disease or end stage renal disease: Secondary | ICD-10-CM | POA: Diagnosis present

## 2013-12-30 DIAGNOSIS — R1110 Vomiting, unspecified: Secondary | ICD-10-CM

## 2013-12-30 DIAGNOSIS — Z5900 Homelessness unspecified: Secondary | ICD-10-CM

## 2013-12-30 DIAGNOSIS — Z9119 Patient's noncompliance with other medical treatment and regimen: Secondary | ICD-10-CM

## 2013-12-30 DIAGNOSIS — R188 Other ascites: Secondary | ICD-10-CM | POA: Diagnosis present

## 2013-12-30 DIAGNOSIS — R109 Unspecified abdominal pain: Secondary | ICD-10-CM

## 2013-12-30 DIAGNOSIS — Z992 Dependence on renal dialysis: Secondary | ICD-10-CM | POA: Insufficient documentation

## 2013-12-30 DIAGNOSIS — N186 End stage renal disease: Secondary | ICD-10-CM

## 2013-12-30 DIAGNOSIS — K5900 Constipation, unspecified: Secondary | ICD-10-CM | POA: Diagnosis present

## 2013-12-30 DIAGNOSIS — R112 Nausea with vomiting, unspecified: Secondary | ICD-10-CM | POA: Diagnosis present

## 2013-12-30 DIAGNOSIS — F17200 Nicotine dependence, unspecified, uncomplicated: Secondary | ICD-10-CM | POA: Insufficient documentation

## 2013-12-30 DIAGNOSIS — D509 Iron deficiency anemia, unspecified: Secondary | ICD-10-CM | POA: Diagnosis present

## 2013-12-30 DIAGNOSIS — N2581 Secondary hyperparathyroidism of renal origin: Secondary | ICD-10-CM | POA: Diagnosis present

## 2013-12-30 DIAGNOSIS — K7460 Unspecified cirrhosis of liver: Secondary | ICD-10-CM

## 2013-12-30 DIAGNOSIS — Z91199 Patient's noncompliance with other medical treatment and regimen due to unspecified reason: Secondary | ICD-10-CM

## 2013-12-30 DIAGNOSIS — I4891 Unspecified atrial fibrillation: Secondary | ICD-10-CM | POA: Diagnosis present

## 2013-12-30 DIAGNOSIS — J189 Pneumonia, unspecified organism: Secondary | ICD-10-CM | POA: Diagnosis present

## 2013-12-30 DIAGNOSIS — J449 Chronic obstructive pulmonary disease, unspecified: Secondary | ICD-10-CM | POA: Diagnosis present

## 2013-12-30 DIAGNOSIS — E872 Acidosis, unspecified: Secondary | ICD-10-CM | POA: Diagnosis present

## 2013-12-30 DIAGNOSIS — B182 Chronic viral hepatitis C: Secondary | ICD-10-CM | POA: Diagnosis present

## 2013-12-30 DIAGNOSIS — K7030 Alcoholic cirrhosis of liver without ascites: Secondary | ICD-10-CM | POA: Diagnosis present

## 2013-12-30 DIAGNOSIS — R748 Abnormal levels of other serum enzymes: Secondary | ICD-10-CM

## 2013-12-30 DIAGNOSIS — R9431 Abnormal electrocardiogram [ECG] [EKG]: Secondary | ICD-10-CM | POA: Diagnosis present

## 2013-12-30 DIAGNOSIS — M899 Disorder of bone, unspecified: Secondary | ICD-10-CM | POA: Diagnosis present

## 2013-12-30 DIAGNOSIS — E875 Hyperkalemia: Secondary | ICD-10-CM | POA: Diagnosis present

## 2013-12-30 LAB — CBC WITH DIFFERENTIAL/PLATELET
Basophils Absolute: 0 10*3/uL (ref 0.0–0.1)
Basophils Relative: 0 % (ref 0–1)
Eosinophils Absolute: 0 10*3/uL (ref 0.0–0.7)
Eosinophils Relative: 0 % (ref 0–5)
HCT: 38.3 % — ABNORMAL LOW (ref 39.0–52.0)
Hemoglobin: 12.9 g/dL — ABNORMAL LOW (ref 13.0–17.0)
Lymphocytes Relative: 14 % (ref 12–46)
Lymphs Abs: 1.5 10*3/uL (ref 0.7–4.0)
MCH: 27.2 pg (ref 26.0–34.0)
MCHC: 33.7 g/dL (ref 30.0–36.0)
MCV: 80.6 fL (ref 78.0–100.0)
MONO ABS: 1.1 10*3/uL — AB (ref 0.1–1.0)
MONOS PCT: 10 % (ref 3–12)
NEUTROS PCT: 76 % (ref 43–77)
Neutro Abs: 8.2 10*3/uL — ABNORMAL HIGH (ref 1.7–7.7)
PLATELETS: 170 10*3/uL (ref 150–400)
RBC: 4.75 MIL/uL (ref 4.22–5.81)
RDW: 20.8 % — AB (ref 11.5–15.5)
WBC: 10.8 10*3/uL — AB (ref 4.0–10.5)

## 2013-12-30 LAB — COMPREHENSIVE METABOLIC PANEL
ALBUMIN: 3.5 g/dL (ref 3.5–5.2)
ALT: 118 U/L — ABNORMAL HIGH (ref 0–53)
AST: 347 U/L — AB (ref 0–37)
Alkaline Phosphatase: 256 U/L — ABNORMAL HIGH (ref 39–117)
BUN: 49 mg/dL — ABNORMAL HIGH (ref 6–23)
CALCIUM: 10.7 mg/dL — AB (ref 8.4–10.5)
CHLORIDE: 82 meq/L — AB (ref 96–112)
CO2: 19 mEq/L (ref 19–32)
CREATININE: 6.06 mg/dL — AB (ref 0.50–1.35)
GFR calc Af Amer: 11 mL/min — ABNORMAL LOW (ref >90)
GFR calc non Af Amer: 9 mL/min — ABNORMAL LOW (ref >90)
Glucose, Bld: 39 mg/dL — CL (ref 70–99)
Potassium: 5.8 mEq/L — ABNORMAL HIGH (ref 3.7–5.3)
SODIUM: 136 meq/L — AB (ref 137–147)
Total Bilirubin: 4.6 mg/dL — ABNORMAL HIGH (ref 0.3–1.2)
Total Protein: 9 g/dL — ABNORMAL HIGH (ref 6.0–8.3)

## 2013-12-30 LAB — CBG MONITORING, ED
GLUCOSE-CAPILLARY: 55 mg/dL — AB (ref 70–99)
GLUCOSE-CAPILLARY: 66 mg/dL — AB (ref 70–99)

## 2013-12-30 LAB — LIPASE, BLOOD: Lipase: 37 U/L (ref 11–59)

## 2013-12-30 MED ORDER — LACTULOSE 10 G PO PACK: 10.0000 g | PACK | Freq: Three times a day (TID) | ORAL | Status: DC

## 2013-12-30 NOTE — ED Provider Notes (Signed)
I have personally seen and examined the patient and discussed plan of care with the resident.  I was present for entire procedure.  I have reviewed the appropriate documentation on PMH/FH/Soc. History.  I have reviewed the documentation of the resident and agree.    Pt in no distress, abd soft without peritoneal signs, stable for outpatient management  Joya Gaskins, MD 12/30/13 [phone]

## 2013-12-30 NOTE — ED Provider Notes (Signed)
Medical screening examination/treatment/procedure(s) were performed by non-physician practitioner and as supervising physician I was immediately available for consultation/collaboration.  EKG Interpretation   None        Aniella Wandrey K Sheyla Zaffino-Rasch, MD 12/30/13 0611 

## 2013-12-30 NOTE — Discharge Instructions (Signed)
Please keep your scheduled follow up primary care doctor appointment for march 6. Please take lactulose as prescribed. Please take all other home medications as prescribed. Please read all discharge instructions and return precautions.    Ascites Ascites is a gathering of fluid in the belly (abdomen). This is most often caused by liver disease. It may also be caused by a number of other less common problems. It causes a ballooning out (distension) of the abdomen. CAUSES  Scarring of the liver (cirrhosis) is the most common cause of ascites. Other causes include:  Infection or inflammation in the abdomen.  Cancer in the abdomen.  Heart failure.  Certain forms of kidney failure (nephritic syndrome).  Inflammation of the pancreas.  Clots in the veins of the liver. SYMPTOMS  In the early stages of ascites, you may not have any symptoms. The main symptom of ascites is a sense of abdominal bloating. This is due to the presence of fluid. This may also cause an increase in abdominal or waist size. People with this condition can develop swelling in the legs, and men can develop a swollen scrotum. When there is a lot of fluid, it may be hard to breath. Stretching of the abdomen by fluid can be painful. DIAGNOSIS  Certain features of your medical history, such as a history of liver disease and of an enlarging abdomen, can suggest the presence of ascites. The diagnosis of ascites can be made on physical exam by your caregiver. An abdominal ultrasound examination can confirm that ascites is present, and estimate the amount of fluid. Once ascites is confirmed, it is important to determine its cause. Again, a history of one of the conditions listed in "CAUSES" provides a strong clue. A physical exam is important, and blood and X-ray tests may be needed. During a procedure called paracentesis, a sample of fluid is removed from the abdomen. This can determine certain key features about the fluid, such as  whether or not infection or cancer is present. Your caregiver will determine if a paracentesis is necessary. They will describe the procedure to you. PREVENTION  Ascites is a complication of other conditions. Therefore to prevent ascites, you must seek treatment for any significant health conditions you have. Once ascites is present, careful attention to fluid and salt intake may help prevent it from getting worse. If you have ascites, you should not drink alcohol. PROGNOSIS  The prognosis of ascites depends on the underlying disease. If the disease is reversible, such as with certain infections or with heart failure, then ascites may improve or disappear. When ascites is caused by cirrhosis, then it indicates that the liver disease has worsened, and further evaluation and treatment of the liver disease is needed. If your ascites is caused by cancer, then the success or failure of the cancer treatment will determine whether your ascites will improve or worsen. RISKS AND COMPLICATIONS  Ascites is likely to worsen if it is not properly diagnosed and treated. A large amount of ascites can cause pain and difficulty breathing. The main complication, besides worsening, is infection (called spontaneous bacterial peritonitis). This requires prompt treatment. TREATMENT  The treatment of ascites depends on its cause. When liver disease is your cause, medical management using water pills (diuretics) and decreasing salt intake is often effective. Ascites due to peritoneal inflammation or malignancy (cancer) alone does not respond to salt restriction and diuretics. Hospitalization is sometimes required. If the treatment of ascites cannot be managed with medications, a number of other treatments are  available. Your caregivers will help you decide which will work best for you. Some of these are:  Removal of fluid from the abdomen (paracentesis).  Fluid from the abdomen is passed into a vein (peritoneovenous  shunting).  Liver transplantation.  Transjugular intrahepatic portosystemic stent shunt. HOME CARE INSTRUCTIONS  It is important to monitor body weight and the intake and output of fluids. Weigh yourself at the same time every day. Record your weights. Fluid restriction may be necessary. It is also important to know your salt intake. The more salt you take in, the more fluid you will retain. Ninety percent of people with ascites respond to this approach.  Follow any directions for medicines carefully.  Follow up with your caregiver, as directed.  Report any changes in your health, especially any new or worsening symptoms.  If your ascites is from liver disease, avoid alcohol and other substances toxic to the liver. SEEK MEDICAL CARE IF:   Your weight increases more than a few pounds in a few days.  Your abdominal or waist size increases.  You develop swelling in your legs.  You had swelling and it worsens. SEEK IMMEDIATE MEDICAL CARE IF:   You develop a fever.  You develop new abdominal pain.  You develop difficulty breathing.  You develop confusion.  You have bleeding from the mouth, stomach, or rectum. MAKE SURE YOU:   Understand these instructions.  Will watch your condition.  Will get help right away if you are not doing well or get worse. Document Released: 10/27/2005 Document Revised: 01/19/2012 Document Reviewed: 05/28/2007 [facility] Patient Information 2014 [place], [place].

## 2013-12-30 NOTE — ED Notes (Signed)
CBG result is 55 mg/dL

## 2013-12-30 NOTE — ED Notes (Signed)
Patient states his last BM was today (12/29/13), states "my belly blew up and I feel like I need to go but I can't". Rates abd pain 9.5/10

## 2013-12-30 NOTE — ED Notes (Signed)
Bed: GE36 Expected date:  Expected time:  Means of arrival:  Comments: EMS 62M VSS

## 2013-12-30 NOTE — ED Notes (Signed)
CBG result is 66.

## 2013-12-30 NOTE — ED Notes (Signed)
[facility] called for patient transport 

## 2013-12-30 NOTE — ED Notes (Signed)
Patient transported to X-ray 

## 2013-12-30 NOTE — ED Provider Notes (Signed)
CSN: [id number]     Arrival date & time 12/30/13  1713 History   First MD Initiated Contact with Patient 12/30/13 1932     Chief Complaint  Patient presents with  . Constipation  . Medication Refill     (Consider location/radiation/quality/duration/timing/severity/associated sxs/prior Treatment) HPI Comments: Patient with history of COPD, hypertension, hepatitis C, and ESRD on HD TRS, presents to the ED with a chief complaint of constipation. He states that he was seen last night, and was told that he would be given a medication for his constipation. States that he never received it. He states that he has chronic abdominal pain, and this is unchanged. He states that yesterday he was able to have a bowel movement once today, but that he tried to go again, and was unsuccessful. He denies any melena or hematemesis. He is only requesting a constipation medication.  The history is provided by the patient. No language interpreter was used.    Past Medical History  Diagnosis Date  . COPD (chronic obstructive pulmonary disease)   . Hypertension   . Hep C w/ coma, chronic   . Irregular heartbeat   . ESRD (end stage renal disease) on dialysis     /notes 11/11/2013  . Smoker unmotivated to quit   . Active smoker   . PTSD (post-traumatic stress disorder)   . Shortness of breath    Past Surgical History  Procedure Laterality Date  . Paracentesis  ~ 10/2013    [name] 11/11/2013   Family History  Problem Relation Age of Onset  . Heart disease Mother   . Hypertension Mother   . Diabetes Mother    History  Substance Use Topics  . Smoking status: Current Every Day Smoker -- 0.50 packs/day for 15 years    Types: Cigarettes  . Smokeless tobacco: Never Used  . Alcohol Use: No     Comment: DENIES (clean 6 mo) Becomes agitated when asked about EtOH use    Review of Systems  All other systems reviewed and are negative.      Allergies  Review of patient's allergies indicates no known  allergies.  Home Medications   Current Outpatient Rx  Name  Route  Sig  Dispense  Refill  . albuterol (PROVENTIL HFA;VENTOLIN HFA) 108 (90 BASE) MCG/ACT inhaler   Inhalation   Inhale 1-2 puffs into the lungs every 6 (six) hours as needed for wheezing or shortness of breath.         Marland Kitchen amLODipine (NORVASC) 10 MG tablet   Oral   Take 10 mg by mouth daily.         Marland Kitchen aspirin EC 81 MG tablet   Oral   Take 81 mg by mouth daily.         Marland Kitchen b complex-vitamin c-folic acid (NEPHRO-VITE) 0.8 MG TABS tablet   Oral   Take 1 tablet by mouth daily.         . calcium acetate (PHOSLO) 667 MG capsule   Oral   Take 1,334 mg by mouth 3 (three) times daily with meals.         . folic acid (FOLVITE) 1 MG tablet   Oral   Take 1 mg by mouth daily.         . hydrOXYzine (ATARAX/VISTARIL) 25 MG tablet   Oral   Take 25 mg by mouth every 6 (six) hours.         Marland Kitchen lactulose (CEPHULAC) 10 G packet   Oral  Take 1 packet (10 g total) by mouth 3 (three) times daily.   30 each   0   . metoprolol (LOPRESSOR) 50 MG tablet   Oral   Take 50 mg by mouth 2 (two) times daily.         . prazosin (MINIPRESS) 2 MG capsule   Oral   Take 4 mg by mouth at bedtime.         . traZODone (DESYREL) 50 MG tablet   Oral   Take 50 mg by mouth at bedtime.          BP 155/97  Pulse 67  Temp(Src) 97.5 F (36.4 C) (Oral)  Resp 18  SpO2 99% Physical Exam  Nursing note and vitals reviewed. Constitutional: He is oriented to person, place, and time. He appears well-developed and well-nourished.  HENT:  Head: Normocephalic and atraumatic.  Eyes: Conjunctivae and EOM are normal. Pupils are equal, round, and reactive to light. Right eye exhibits no discharge. Left eye exhibits no discharge. No scleral icterus.  Neck: Normal range of motion. Neck supple. No JVD present.  Cardiovascular: Normal rate, regular rhythm and normal heart sounds.  Exam reveals no gallop and no friction rub.   No murmur  heard. Pulmonary/Chest: Effort normal and breath sounds normal. No respiratory distress. He has no wheezes. He has no rales. He exhibits no tenderness.  Abdominal: Soft. He exhibits distension. He exhibits no mass. There is no tenderness. There is no rebound and no guarding.  Abdomen is distended, and there is no obvious fluid wave, it is diffusely uncomfortable with palpation, but there is no focal tenderness, no signs of surgical or acute abdomen  Musculoskeletal: Normal range of motion. He exhibits no edema and no tenderness.  Neurological: He is alert and oriented to person, place, and time.  Skin: Skin is warm and dry.  Psychiatric: He has a normal mood and affect. His behavior is normal. Judgment and thought content normal.    ED Course  Procedures (including critical care time) Labs Review Labs Reviewed  CBC WITH DIFFERENTIAL  COMPREHENSIVE METABOLIC PANEL  LIPASE, BLOOD   Imaging Review Dg Abd 1 View  12/30/2013   CLINICAL DATA:  Constipation.  Mid abdominal pain.  EXAM: ABDOMEN - 1 VIEW  COMPARISON:  US PARACENTESIS dated 12/09/2013; DG ABD ACUTE W/CHEST dated 11/20/2013  FINDINGS: Centralized mildly prominent bowel loops, predominately small bowel loops. This centralization likely related to ascites. Mild gaseous distention may reflect mild ileus. No organomegaly. No free air.  IMPRESSION: Centralized, mildly prominent small bowel loops. Suspect ascites and ileus.   Electronically Signed   By: Charlett Nose M.D.   On: 12/30/2013 04:06   Dg Abd Acute W/chest  12/30/2013   CLINICAL DATA:  One day history of nausea and vomiting. Constipation.  EXAM: ACUTE ABDOMEN SERIES (ABDOMEN 2 VIEW & CHEST 1 VIEW) 12/30/2013 2049 hr:  COMPARISON:  DG ABDOMEN 1V dated 12/30/2013 0349 hr; DG CHEST 2 VIEW dated 12/22/2013; DG CHEST 2 VIEW dated 12/21/2013; DG CHEST 1V PORT dated 12/19/2013; DG ABD ACUTE W/CHEST dated 11/20/2013  FINDINGS: Mild gaseous distention of a solitary loop of small bowel in the left mid  abdomen, with improvement in the bowel gas pattern since the examination earlier today. Gas in normal caliber colon. Scattered colonic and small bowel air-fluid levels on the decubitus image. No free intraperitoneal air. Phleboliths low in both sides of the pelvis.  Stable marked cardiomegaly. Pulmonary venous hypertension without overt edema. Scarring at the right  lung base at the site of the prior pneumonia. Lungs otherwise clear. No localized airspace consolidation. No pleural effusions. No pneumothorax.  IMPRESSION: 1. Improving partial small bowel obstruction or ileus, with fewer distended loops of small bowel in the abdomen when compared to the examination earlier same date. No free intraperitoneal air. 2. Stable marked cardiomegaly. Pulmonary venous hypertension without overt edema. Scarring at the right lung base. No acute cardiopulmonary disease.   Electronically Signed   By: Hulan Saashomas  Lawrence M.D.   On: 12/30/2013 21:13    EKG Interpretation   None       MDM   Final diagnoses:  Abdominal pain  Vomiting  Elevated liver enzymes    Patient with chronic abdominal pain.  He tells me that he only wants his medication for constipation.  He is scheduled to have HD tomorrow morning.    LFTs are acutely elevated.  Discussed the patient with Dr. Fredderick PhenixBelfi.  Will admit to internal medicine.  Hyperkalemic, will give some kayexalate and zofran.    Patient discussed with Dr. Manson PasseyBrown from internal medicine.  [facility] and further evaluation and care.     Roxy Horsemanobert Keaton Stirewalt, PA-C 12/31/13 [phone]

## 2013-12-30 NOTE — ED Notes (Signed)
Patient has a fistula to the right upper arm. Thrill and bruit present. Patient has dialysis on Tuesday, Thursday and Saturdays.

## 2013-12-30 NOTE — ED Notes (Signed)
Restricted extremity bracelet on R wrist from most recent hospital visit.

## 2013-12-30 NOTE — ED Notes (Addendum)
Per EMS, Pt, from home, c/o constipation x 1 day.  Pt was seen at [facility] this morning and [facility] 2/18 for same and diagnosed w/ Ascites.  Pt, also, sts that he has been out of his medications for 1 week.

## 2013-12-30 NOTE — ED Notes (Signed)
Patient taken by [facility] at this time. Patient discharge instructions and rx given to [facility].

## 2013-12-30 NOTE — ED Provider Notes (Signed)
CSN: [id number]     Arrival date & time 12/30/13  8453 History   First MD Initiated Contact with Patient 12/30/13 0400     Chief Complaint  Patient presents with  . Constipation    last BM 12/29/13     (Consider location/radiation/quality/duration/timing/severity/associated sxs/prior Treatment) HPI Comments: 58 yo M hx of COPD, HTN, Hep C w/ cirrhosis, ESRD on HD TRS, recent admission for HCAP 2/11 presented to the emergency department for abdominal discomfort with concern for constipation. He states his last bowel movement was today.  He denies that the bowel movement was melenic or had any bright red blood per Pt states his abdominal discomfort started one day ago.  He reports he has recurrent abdominal distension due to his ascites Pt has had multiple ED visits for this in the past year, including one on 12/29/2013.  Pt states abdomen is diffusely tight with mild pain distention, he states he has had previous episodes of this in the past. Patient denies any changes from his abdominal pain from his visit on 12/29/13.  Pt states symptoms typically improve with paracentesis, which was last done on 12/19/13 in ED with 1 L ascitic fluid removed at that time. He denies fever, chills, CP, SOB, nausea, vomiting, diarrhea, constipation, myalgias, rash, or any other symptoms. Although he is complaining of dry cracked heels. Pt does get dialysis on a regular basis, last occurrence on 12/28/13, and he received full dialysis at that time.   Patient is a 58 y.o. male presenting with constipation.  Constipation Associated symptoms: abdominal pain   Associated symptoms: no fever, no nausea and no vomiting     Past Medical History  Diagnosis Date  . COPD (chronic obstructive pulmonary disease)   . Hypertension   . Hep C w/ coma, chronic   . Irregular heartbeat   . ESRD (end stage renal disease) on dialysis     /notes 11/11/2013  . Smoker unmotivated to quit   . Active smoker   . PTSD (post-traumatic stress  disorder)   . Shortness of breath    Past Surgical History  Procedure Laterality Date  . Paracentesis  ~ 10/2013    [name] 11/11/2013   Family History  Problem Relation Age of Onset  . Heart disease Mother   . Hypertension Mother   . Diabetes Mother    History  Substance Use Topics  . Smoking status: Current Every Day Smoker -- 0.50 packs/day for 15 years    Types: Cigarettes  . Smokeless tobacco: Never Used  . Alcohol Use: No     Comment: DENIES (clean 6 mo) Becomes agitated when asked about EtOH use    Review of Systems  Constitutional: Negative for fever and chills.  Respiratory: Negative for shortness of breath.   Cardiovascular: Negative for chest pain.  Gastrointestinal: Positive for abdominal pain and constipation. Negative for nausea and vomiting.  All other systems reviewed and are negative.      Allergies  Review of patient's allergies indicates no known allergies.  Home Medications   Current Outpatient Rx  Name  Route  Sig  Dispense  Refill  . albuterol (PROVENTIL HFA;VENTOLIN HFA) 108 (90 BASE) MCG/ACT inhaler   Inhalation   Inhale 1-2 puffs into the lungs every 6 (six) hours as needed for wheezing or shortness of breath.         Marland Kitchen amLODipine (NORVASC) 10 MG tablet   Oral   Take 10 mg by mouth daily.         Marland Kitchen  aspirin EC 81 MG tablet   Oral   Take 81 mg by mouth daily.         Marland Kitchen. b complex-vitamin c-folic acid (NEPHRO-VITE) 0.8 MG TABS tablet   Oral   Take 1 tablet by mouth daily.         . calcium acetate (PHOSLO) 667 MG capsule   Oral   Take 1,334 mg by mouth 3 (three) times daily with meals.         . folic acid (FOLVITE) 1 MG tablet   Oral   Take 1 mg by mouth daily.         . hydrOXYzine (ATARAX/VISTARIL) 25 MG tablet   Oral   Take 25 mg by mouth every 6 (six) hours.         . metoprolol (LOPRESSOR) 50 MG tablet   Oral   Take 50 mg by mouth 2 (two) times daily.         . prazosin (MINIPRESS) 2 MG capsule   Oral    Take 4 mg by mouth at bedtime.         . traZODone (DESYREL) 50 MG tablet   Oral   Take 50 mg by mouth at bedtime.          BP 144/101  Pulse 77  Temp(Src) 98.1 F (36.7 C) (Oral)  Resp 16  SpO2 99% Physical Exam  Constitutional: He is oriented to person, place, and time. He appears well-developed and well-nourished. No distress.  HENT:  Head: Normocephalic and atraumatic.  Right Ear: External ear normal.  Left Ear: External ear normal.  Nose: Nose normal.  Mouth/Throat: Oropharynx is clear and moist. No oropharyngeal exudate.  Eyes: Conjunctivae are normal.  Neck: Neck supple.  Cardiovascular: Normal rate, regular rhythm and normal heart sounds.   Pulmonary/Chest: Effort normal and breath sounds normal. No respiratory distress.  Abdominal: Soft. Bowel sounds are normal. He exhibits distension and ascites. There is generalized tenderness. There is no rigidity, no rebound and no guarding.  No peritoneal signs  Musculoskeletal: Normal range of motion.  Neurological: He is alert and oriented to person, place, and time.  Skin: Skin is warm and dry. No rash noted. He is not diaphoretic.  Dry skin    ED Course  Procedures (including critical care time) Labs Review Labs Reviewed - No data to display Imaging Review Dg Abd 1 View  12/30/2013   CLINICAL DATA:  Constipation.  Mid abdominal pain.  EXAM: ABDOMEN - 1 VIEW  COMPARISON:  US PARACENTESIS dated 12/09/2013; DG ABD ACUTE W/CHEST dated 11/20/2013  FINDINGS: Centralized mildly prominent bowel loops, predominately small bowel loops. This centralization likely related to ascites. Mild gaseous distention may reflect mild ileus. No organomegaly. No free air.  IMPRESSION: Centralized, mildly prominent small bowel loops. Suspect ascites and ileus.   Electronically Signed   By: Charlett NoseKevin  Dover M.D.   On: 12/30/2013 04:06    EKG Interpretation   None       MDM   Final diagnoses:  None    Filed Vitals:   12/30/13 0319  BP:  144/101  Pulse: 77  Temp: 98.1 F (36.7 C)  Resp: 16     Afebrile, NAD, non-toxic appearing, AAOx4. Abdominal distended with fluid wave, with no signs of peritonitis, and only mild diffusely tender which is typical for patient. No other concerning features suggestive of SBP by history or exam. And low suspicion for any other abdominal processes at this time. Abdominal x-ray revealed. Noted  to ascites. No stool burden. Advised patient to keep his followup appointment on March 6. Will prescribe lactulose. Return precautions discussed. Patient agreeable to plan. Patient is stable at time of discharge. Patient d/w with Dr. Nicanor Alcon, agrees with plan.        Jeannetta Ellis, PA-C 12/30/13 [phone]

## 2013-12-30 NOTE — ED Notes (Signed)
Per EMS, patient from home, c/o constipation. Last BM 12/28/13.

## 2013-12-31 ENCOUNTER — Encounter (HOSPITAL_COMMUNITY): Payer: Self-pay | Admitting: *Deleted

## 2013-12-31 DIAGNOSIS — E872 Acidosis, unspecified: Secondary | ICD-10-CM

## 2013-12-31 DIAGNOSIS — K5900 Constipation, unspecified: Secondary | ICD-10-CM

## 2013-12-31 DIAGNOSIS — D509 Iron deficiency anemia, unspecified: Secondary | ICD-10-CM

## 2013-12-31 DIAGNOSIS — R112 Nausea with vomiting, unspecified: Secondary | ICD-10-CM | POA: Diagnosis present

## 2013-12-31 DIAGNOSIS — R7402 Elevation of levels of lactic acid dehydrogenase (LDH): Secondary | ICD-10-CM

## 2013-12-31 DIAGNOSIS — R740 Nonspecific elevation of levels of transaminase and lactic acid dehydrogenase [LDH]: Secondary | ICD-10-CM

## 2013-12-31 DIAGNOSIS — K56600 Partial intestinal obstruction, unspecified as to cause: Secondary | ICD-10-CM | POA: Diagnosis present

## 2013-12-31 DIAGNOSIS — J449 Chronic obstructive pulmonary disease, unspecified: Secondary | ICD-10-CM

## 2013-12-31 DIAGNOSIS — I4891 Unspecified atrial fibrillation: Secondary | ICD-10-CM

## 2013-12-31 DIAGNOSIS — I120 Hypertensive chronic kidney disease with stage 5 chronic kidney disease or end stage renal disease: Secondary | ICD-10-CM

## 2013-12-31 DIAGNOSIS — E162 Hypoglycemia, unspecified: Secondary | ICD-10-CM

## 2013-12-31 DIAGNOSIS — D696 Thrombocytopenia, unspecified: Secondary | ICD-10-CM

## 2013-12-31 LAB — CBC
HCT: 34.2 % — ABNORMAL LOW (ref 39.0–52.0)
Hemoglobin: 11.5 g/dL — ABNORMAL LOW (ref 13.0–17.0)
MCH: 26.7 pg (ref 26.0–34.0)
MCHC: 33.6 g/dL (ref 30.0–36.0)
MCV: 79.5 fL (ref 78.0–100.0)
PLATELETS: 151 10*3/uL (ref 150–400)
RBC: 4.3 MIL/uL (ref 4.22–5.81)
RDW: 20.2 % — ABNORMAL HIGH (ref 11.5–15.5)
WBC: 9.5 10*3/uL (ref 4.0–10.5)

## 2013-12-31 LAB — RENAL FUNCTION PANEL
ALBUMIN: 2.8 g/dL — AB (ref 3.5–5.2)
BUN: 62 mg/dL — ABNORMAL HIGH (ref 6–23)
CALCIUM: 9.8 mg/dL (ref 8.4–10.5)
CO2: 24 mEq/L (ref 19–32)
Chloride: 85 mEq/L — ABNORMAL LOW (ref 96–112)
Creatinine, Ser: 6.66 mg/dL — ABNORMAL HIGH (ref 0.50–1.35)
GFR calc Af Amer: 9 mL/min — ABNORMAL LOW (ref >90)
GFR, EST NON AFRICAN AMERICAN: 8 mL/min — AB (ref >90)
GLUCOSE: 197 mg/dL — AB (ref 70–99)
PHOSPHORUS: 10.6 mg/dL — AB (ref 2.3–4.6)
Potassium: 5.1 mEq/L (ref 3.7–5.3)
SODIUM: 138 meq/L (ref 137–147)

## 2013-12-31 LAB — GLUCOSE, CAPILLARY: GLUCOSE-CAPILLARY: 151 mg/dL — AB (ref 70–99)

## 2013-12-31 LAB — BILIRUBIN, FRACTIONATED(TOT/DIR/INDIR)
BILIRUBIN DIRECT: 2.8 mg/dL — AB (ref 0.0–0.3)
Indirect Bilirubin: 0.9 mg/dL (ref 0.3–0.9)
Total Bilirubin: 3.7 mg/dL — ABNORMAL HIGH (ref 0.3–1.2)

## 2013-12-31 LAB — PROTIME-INR
INR: 1.92 — ABNORMAL HIGH (ref 0.00–1.49)
PROTHROMBIN TIME: 21.4 s — AB (ref 11.6–15.2)

## 2013-12-31 LAB — HEPATIC FUNCTION PANEL
ALT: 103 U/L — ABNORMAL HIGH (ref 0–53)
AST: 280 U/L — ABNORMAL HIGH (ref 0–37)
Albumin: 2.8 g/dL — ABNORMAL LOW (ref 3.5–5.2)
Alkaline Phosphatase: 201 U/L — ABNORMAL HIGH (ref 39–117)
BILIRUBIN INDIRECT: 1 mg/dL — AB (ref 0.3–0.9)
BILIRUBIN TOTAL: 2.9 mg/dL — AB (ref 0.3–1.2)
Bilirubin, Direct: 1.9 mg/dL — ABNORMAL HIGH (ref 0.0–0.3)
Total Protein: 7.4 g/dL (ref 6.0–8.3)

## 2013-12-31 LAB — ACETAMINOPHEN LEVEL: Acetaminophen (Tylenol), Serum: <15 ug/mL (ref 10–30)

## 2013-12-31 LAB — MRSA PCR SCREENING: MRSA BY PCR: NEGATIVE

## 2013-12-31 LAB — ETHANOL: Alcohol, Ethyl (B): <11 mg/dL (ref 0–11)

## 2013-12-31 LAB — CBG MONITORING, ED: Glucose-Capillary: 67 mg/dL — ABNORMAL LOW (ref 70–99)

## 2013-12-31 LAB — MAGNESIUM: Magnesium: 2.7 mg/dL — ABNORMAL HIGH (ref 1.5–2.5)

## 2013-12-31 LAB — LACTIC ACID, PLASMA: Lactic Acid, Venous: 6.3 mmol/L — ABNORMAL HIGH (ref 0.5–2.2)

## 2013-12-31 MED ORDER — HEPARIN SODIUM (PORCINE) 5000 UNIT/ML IJ SOLN
5000.0000 [IU] | Freq: Three times a day (TID) | INTRAMUSCULAR | Status: DC
  Administered 2013-12-31 – 2014-01-02 (×5): 5000 [IU] via SUBCUTANEOUS
  Filled 2013-12-31 (×10): qty 1

## 2013-12-31 MED ORDER — BOOST / RESOURCE BREEZE PO LIQD
1.0000 | Freq: Three times a day (TID) | ORAL | Status: DC
  Administered 2014-01-01 – 2014-01-02 (×4): 1 via ORAL

## 2013-12-31 MED ORDER — METOPROLOL TARTRATE 25 MG PO TABS
25.0000 mg | ORAL_TABLET | Freq: Two times a day (BID) | ORAL | Status: DC
  Administered 2013-12-31 – 2014-01-02 (×4): 25 mg via ORAL
  Filled 2013-12-31 (×6): qty 1

## 2013-12-31 MED ORDER — LACTULOSE 10 GM/15ML PO SOLN
10.0000 g | Freq: Three times a day (TID) | ORAL | Status: DC
  Administered 2013-12-31 – 2014-01-02 (×7): 10 g via ORAL
  Filled 2013-12-31 (×10): qty 15

## 2013-12-31 MED ORDER — ALBUTEROL SULFATE (2.5 MG/3ML) 0.083% IN NEBU: 2.5000 mg | INHALATION_SOLUTION | Freq: Four times a day (QID) | RESPIRATORY_TRACT | Status: DC | PRN

## 2013-12-31 MED ORDER — BOOST / RESOURCE BREEZE PO LIQD: 1.0000 | Freq: Three times a day (TID) | ORAL | Status: DC

## 2013-12-31 MED ORDER — ASPIRIN EC 81 MG PO TBEC
81.0000 mg | DELAYED_RELEASE_TABLET | Freq: Every day | ORAL | Status: DC
  Administered 2013-12-31 – 2014-01-02 (×3): 81 mg via ORAL
  Filled 2013-12-31 (×3): qty 1

## 2013-12-31 MED ORDER — RENA-VITE PO TABS
1.0000 | ORAL_TABLET | Freq: Every day | ORAL | Status: DC
  Administered 2013-12-31 – 2014-01-01 (×2): 1 via ORAL
  Filled 2013-12-31 (×4): qty 1

## 2013-12-31 MED ORDER — SODIUM CHLORIDE 0.9 % IJ SOLN
3.0000 mL | Freq: Two times a day (BID) | INTRAMUSCULAR | Status: DC
  Administered 2013-12-31 – 2014-01-01 (×5): 3 mL via INTRAVENOUS

## 2013-12-31 MED ORDER — DEXTROSE 50 % IV SOLN
1.0000 | Freq: Once | INTRAVENOUS | Status: AC
  Administered 2013-12-31: 50 mL via INTRAVENOUS
  Filled 2013-12-31: qty 50

## 2013-12-31 MED ORDER — LEVOFLOXACIN 250 MG PO TABS: 250.0000 mg | ORAL_TABLET | ORAL | Status: DC

## 2013-12-31 MED ORDER — TRAZODONE HCL 50 MG PO TABS
50.0000 mg | ORAL_TABLET | Freq: Every day | ORAL | Status: DC
  Administered 2013-12-31 – 2014-01-01 (×3): 50 mg via ORAL
  Filled 2013-12-31 (×5): qty 1

## 2013-12-31 MED ORDER — PRAZOSIN HCL 2 MG PO CAPS
4.0000 mg | ORAL_CAPSULE | Freq: Every day | ORAL | Status: DC
  Administered 2013-12-31 – 2014-01-01 (×3): 4 mg via ORAL
  Filled 2013-12-31 (×5): qty 2

## 2013-12-31 MED ORDER — FOLIC ACID 1 MG PO TABS
1.0000 mg | ORAL_TABLET | Freq: Every day | ORAL | Status: DC
  Administered 2013-12-31 – 2014-01-02 (×3): 1 mg via ORAL
  Filled 2013-12-31 (×3): qty 1

## 2013-12-31 MED ORDER — PROMETHAZINE HCL 25 MG PO TABS: 12.5000 mg | ORAL_TABLET | Freq: Four times a day (QID) | ORAL | Status: DC | PRN

## 2013-12-31 MED ORDER — AMOXICILLIN-POT CLAVULANATE 250-125 MG PO TABS
1.0000 | ORAL_TABLET | Freq: Every day | ORAL | Status: DC
  Administered 2013-12-31 – 2014-01-02 (×3): 1 via ORAL
  Filled 2013-12-31 (×3): qty 1

## 2013-12-31 MED ORDER — METOPROLOL TARTRATE 50 MG PO TABS
50.0000 mg | ORAL_TABLET | Freq: Two times a day (BID) | ORAL | Status: DC
  Administered 2013-12-31: 50 mg via ORAL
  Filled 2013-12-31 (×3): qty 1

## 2013-12-31 MED ORDER — SODIUM CHLORIDE 0.9 % IV SOLN
125.0000 mg | INTRAVENOUS | Status: DC
  Administered 2013-12-31: 125 mg via INTRAVENOUS
  Filled 2013-12-31 (×2): qty 10

## 2013-12-31 MED ORDER — PRO-STAT SUGAR FREE PO LIQD
30.0000 mL | Freq: Two times a day (BID) | ORAL | Status: DC
  Administered 2013-12-31 – 2014-01-02 (×4): 30 mL via ORAL
  Filled 2013-12-31 (×5): qty 30

## 2013-12-31 MED ORDER — HYDROXYZINE HCL 25 MG PO TABS
25.0000 mg | ORAL_TABLET | Freq: Four times a day (QID) | ORAL | Status: DC | PRN
  Administered 2014-01-02: 25 mg via ORAL
  Filled 2013-12-31: qty 1

## 2013-12-31 MED ORDER — DEXTROSE 50 % IV SOLN: 1.0000 | INTRAVENOUS | Status: DC | PRN

## 2013-12-31 MED ORDER — ONDANSETRON HCL 4 MG/2ML IJ SOLN
4.0000 mg | Freq: Once | INTRAMUSCULAR | Status: AC
  Administered 2013-12-31: 4 mg via INTRAVENOUS
  Filled 2013-12-31: qty 2

## 2013-12-31 MED ORDER — SODIUM POLYSTYRENE SULFONATE 15 GM/60ML PO SUSP
15.0000 g | Freq: Once | ORAL | Status: AC
  Administered 2013-12-31: 15 g via ORAL
  Filled 2013-12-31: qty 60

## 2013-12-31 MED ORDER — CALCIUM ACETATE 667 MG PO CAPS
1334.0000 mg | ORAL_CAPSULE | Freq: Three times a day (TID) | ORAL | Status: DC
  Filled 2013-12-31 (×4): qty 2

## 2013-12-31 MED ORDER — AMLODIPINE BESYLATE 10 MG PO TABS
10.0000 mg | ORAL_TABLET | Freq: Every day | ORAL | Status: DC
  Administered 2013-12-31 – 2014-01-02 (×3): 10 mg via ORAL
  Filled 2013-12-31 (×3): qty 1

## 2013-12-31 NOTE — H&P (Signed)
  Date: 12/31/2013  Patient name: Carl Benson  Medical record number: [id number]  Date of birth: 02/17/1956   I have seen and evaluated Carl Benson and discussed their care with the Residency Team.   Assessment and Plan: I have seen and evaluated the patient as outlined above. I agree with the formulated Assessment and Plan as detailed in the residents' admission note, with the following changes:   1. Patient to get dialysis.  SBO but moving bowels.  Hopefully [facility] services will help in the future.    Gardiner Barefoot, MD 2/21/201511:52 AM

## 2013-12-31 NOTE — ED Provider Notes (Signed)
Medical screening examination/treatment/procedure(s) were conducted as a shared visit with non-physician practitioner(s) and myself.  I personally evaluated the patient during the encounter.    Pt with abd pain, vomiting in ED, ESRD with elevated potassium, elevated LFTS more than baseline.  This is third recent visit.  Will admit.  Rolan Bucco, MD 12/31/13 [phone]

## 2013-12-31 NOTE — Progress Notes (Signed)
New Admission Note:  Arrival Method: Via stretcher with care link RN Mental Orientation: alert and oriented Telemetry: placed on box 25, notified CCMD Assessment: Completed Skin: intact, cracking heel (right leg) Pain: denies any pain Safety Measures: Safety Fall Prevention Plan was given, discussed and signed. Admission: Completed [address] [place] Orientation: Patient has been orientated to the room, unit and the staff. Orders have been reviewed and implemented. Will continue to monitor the patient. Call light as been placed within reach and bed alarm has been activated.   Tempie Donning BSN, RN  Phone Number: [phone] [facility] [address] [place] Med/Surg-Renal Unit

## 2013-12-31 NOTE — Discharge Summary (Signed)
Name: Carl Benson MRN: [id number] DOB: 08/27/1956 58 y.o. PCP: No Pcp Per Patient  Date of Admission: 12/30/2013  7:16 PM Date of Discharge: 01/02/2014 Attending Physician: Oval Linsey, MD  Discharge Diagnosis: Principal Problem:   Partial small bowel obstruction vs ileus, resolved Active Problems:   ESRD on hemodialysis   Hyperkalemia, resolved   Hepatic cirrhosis due to chronic hepatitis C infection   PTSD (post-traumatic stress disorder)   Ascites   Nausea with vomiting, resolved   Discharge Medications:   Medication List         albuterol 108 (90 BASE) MCG/ACT inhaler  Commonly known as:  PROVENTIL HFA;VENTOLIN HFA  Inhale 1-2 puffs into the lungs every 6 (six) hours as needed for wheezing or shortness of breath.     amLODipine 10 MG tablet  Commonly known as:  NORVASC  Take 10 mg by mouth daily.     amoxicillin-clavulanate 250-125 MG per tablet  Commonly known as:  AUGMENTIN  Take 1 tablet by mouth daily.     aspirin EC 81 MG tablet  Take 81 mg by mouth daily.     b complex-vitamin c-folic acid 0.8 MG Tabs tablet  Take 1 tablet by mouth daily.     calcium acetate 667 MG capsule  Commonly known as:  PHOSLO  Take 1,334 mg by mouth 3 (three) times daily with meals.     folic acid 1 MG tablet  Commonly known as:  FOLVITE  Take 1 mg by mouth daily.     hydrOXYzine 25 MG tablet  Commonly known as:  ATARAX/VISTARIL  Take 25 mg by mouth every 6 (six) hours.     lactulose 10 G packet  Commonly known as:  CEPHULAC  Take 1 packet (10 g total) by mouth 3 (three) times daily.     metoprolol 50 MG tablet  Commonly known as:  LOPRESSOR  Take 50 mg by mouth 2 (two) times daily.     prazosin 2 MG capsule  Commonly known as:  MINIPRESS  Take 4 mg by mouth at bedtime.     traZODone 50 MG tablet  Commonly known as:  DESYREL  Take 50 mg by mouth at bedtime.        Disposition and follow-up:   Mr.Carl Benson was discharged from [facility] in Stable condition.  At the hospital follow up visit please address:  1.   - Medication compliance with antibiotics for HCAP and with other medications  - Compliance with HD - If he followed up with the [facility] in [place]  2.  Labs / imaging needed at time of follow-up: CMP to check potassium, anion gap, and hepatic function 2/2 transaminitis.   3.  Pending labs/ test needing follow-up: None  Follow-up Appointments: F/u at [facility] in [place] at appt on March 6th  Discharge Instructions: Discharge Orders   Future Orders Complete By Expires   Call MD for:  persistant nausea and vomiting  As directed    Call MD for:  temperature >100.4  As directed       Consultations: Treatment Team:  Donetta Potts, MD  Procedures Performed:  Dg Chest 2 View  12/22/2013   CLINICAL DATA:  Re-evaluate pneumonia  EXAM: CHEST  2 VIEW  COMPARISON:  DG CHEST 2 VIEW dated 12/21/2013  FINDINGS: Stable cardiomegaly. Vascular pattern normal. Left lung clear. On the right, there is expanded and more conspicuous consolidation with small new effusion. Area of consolidation measures approximately  5 cm in greatest diameter.  IMPRESSION: Right lower lobe pneumonia with small associated effusion worse when compared to the prior study.   Electronically Signed   By: Skipper Cliche M.D.   On: 12/22/2013 15:08   Dg Chest 2 View  12/21/2013   CLINICAL DATA:  Shortness of breath, cough  EXAM: CHEST  2 VIEW  COMPARISON:  December 19, 2013  FINDINGS: The heart size and mediastinal contours are stable. The heart size is enlarged. There is no pulmonary edema. Patchy consolidation of right lung base is noted. There is a small posterior pleural effusion. The visualized skeletal structures are stable.  IMPRESSION: Right lung base pneumonia.  Cardiomegaly.   Electronically Signed   By: Abelardo Diesel M.D.   On: 12/21/2013 02:42   X-ray Chest Pa And Lateral   12/12/2013   CLINICAL DATA:  Shortness of breath and constipation   EXAM: CHEST  2 VIEW  COMPARISON:  12/09/2013  FINDINGS: Streaky lower lung opacities, especially at the left base. The could be a trace left pleural effusion. Mild chronic cardiomegaly. No acute osseous findings.  IMPRESSION: Lower lung atelectasis.  No edema or consolidation.   Electronically Signed   By: Jorje Guild M.D.   On: 12/12/2013 05:52   Dg Chest 2 View  12/09/2013   CLINICAL DATA:  Chest and abdomen pain.  COPD.  EXAM: CHEST  2 VIEW  COMPARISON:  11/27/2013 and 11/11/2013  FINDINGS: Persistent prominent cardiomegaly and pulmonary vascular congestion. Increased small bilateral pleural effusions.  IMPRESSION: Increased small effusions. Persistent cardiomegaly and pulmonary vascular congestion.   Electronically Signed   By: Rozetta Nunnery M.D.   On: 12/09/2013 07:05   Dg Abd 1 View  12/30/2013   CLINICAL DATA:  Constipation.  Mid abdominal pain.  EXAM: ABDOMEN - 1 VIEW  COMPARISON:  US PARACENTESIS dated 12/09/2013; DG ABD ACUTE W/CHEST dated 11/20/2013  FINDINGS: Centralized mildly prominent bowel loops, predominately small bowel loops. This centralization likely related to ascites. Mild gaseous distention may reflect mild ileus. No organomegaly. No free air.  IMPRESSION: Centralized, mildly prominent small bowel loops. Suspect ascites and ileus.   Electronically Signed   By: Rolm Baptise M.D.   On: 12/30/2013 04:06   US Paracentesis  12/09/2013   CLINICAL DATA:  Ascites  EXAM: ULTRASOUND GUIDED PARACENTESIS  TECHNIQUE: Survey ultrasound of the abdomen was performed and an appropriate skin entry site in the LLQ abdomen was selected. Skin site was marked, prepped with Betadine, and draped in usual sterile fashion, and infiltrated locally with 1% lidocaine. A 5 French multisidehole Yueh sheath needle was advanced into the peritoneal space until fluid could be aspirated. The sheath was advanced and the needle removed. 1.2 liters of yellow ascites were aspirated. No immediate complication.  IMPRESSION:  Technically successful ultrasound guided paracentesis, removing 1.2 liters of ascites.  Read By:  Tsosie Billing PA-C   Electronically Signed   By: Aletta Edouard M.D.   On: 12/09/2013 15:57   Dg Chest Port 1 View  12/19/2013   CLINICAL DATA:  Shortness of breath  EXAM: PORTABLE CHEST - 1 VIEW  COMPARISON:  December 12, 2013  FINDINGS: There is no edema or consolidation. Heart is enlarged with normal pulmonary vascularity. No adenopathy. No bone lesions.  IMPRESSION: Cardiomegaly.  No edema or consolidation.   Electronically Signed   By: Lowella Grip M.D.   On: 12/19/2013 16:07   Dg Abd Acute W/chest  12/30/2013   CLINICAL DATA:  One day history  of nausea and vomiting. Constipation.  EXAM: ACUTE ABDOMEN SERIES (ABDOMEN 2 VIEW & CHEST 1 VIEW) 12/30/2013 2049 hr:  COMPARISON:  DG ABDOMEN 1V dated 12/30/2013 0349 hr; DG CHEST 2 VIEW dated 12/22/2013; DG CHEST 2 VIEW dated 12/21/2013; DG CHEST 1V PORT dated 12/19/2013; DG ABD ACUTE W/CHEST dated 11/20/2013  FINDINGS: Mild gaseous distention of a solitary loop of small bowel in the left mid abdomen, with improvement in the bowel gas pattern since the examination earlier today. Gas in normal caliber colon. Scattered colonic and small bowel air-fluid levels on the decubitus image. No free intraperitoneal air. Phleboliths low in both sides of the pelvis.  Stable marked cardiomegaly. Pulmonary venous hypertension without overt edema. Scarring at the right lung base at the site of the prior pneumonia. Lungs otherwise clear. No localized airspace consolidation. No pleural effusions. No pneumothorax.  IMPRESSION: 1. Improving partial small bowel obstruction or ileus, with fewer distended loops of small bowel in the abdomen when compared to the examination earlier same date. No free intraperitoneal air. 2. Stable marked cardiomegaly. Pulmonary venous hypertension without overt edema. Scarring at the right lung base. No acute cardiopulmonary disease.   Electronically Signed    By: Evangeline Dakin M.D.   On: 12/30/2013 21:13   Admission History of Present Illness:  Carl Benson is a 58 year old man with past medical history of of ESRD on TTS HD, PTSD, chronic liver cirrhosis secondary to HCV, HTN, PAF, and COPD presenting with vomiting. The patient notes a 1-day history of vomiting, with 4-5 episodes of non-bloody vomiting on the day of admission. He notes associated "soft" bowel movements, 2-3/day for the last 2 days with chronic diffuse abdominal pain and distension (unclear if worse than baseline), as well as poor PO intake during this time period. The patient also notes increased "urge" to defecate during this time period, but no fevers, chills, sick contacts, rhinorrhea, or change in his chronic non-productive cough. The patient notes mildly increased DOE and LE edema since his last HD session 2/19. The patient has chronic ascites, secondary to cirrhosis, with last paracentesis 2/9, and chronic abdominal pain. The patient was recently discharged 2/15 after an admission for HCAP, and he has been non-compliant with outpatient antibiotics. In the ED, the patient was found to have a potassium of 5.9, and elevated LFT's, prompting admission Review of Systems:  General: no fevers, chills  Skin: no rash  HEENT: no blurry vision, hearing changes, sore throat  Pulm: no dyspnea, wheezing  CV: no chest pain, palpitations  Abd: see HPI  GU: anuric at baseline  Ext: no arthralgias, myalgias  Neuro: no weakness, numbness, or tingling  Physical Exam:  Blood pressure 155/92, pulse 78, temperature 97.2 F (36.2 C), temperature source Oral, resp. rate 17, weight 55.384 kg (122 lb 1.6 oz), SpO2 100.00%.  General: alert, moderately cooperative, though asked [place] to leave the room at one point HEENT: PERRL, EOMI, oropharynx non-erythematous Neck: supple Lungs: clear to ascultation bilaterally, normal work of respiration, no wheezes, rales, ronchi Heart: regular rate and  rhythm Abdomen: soft, moderately distended, mildly diffusely ttp though with no focal tenderness, no involuntary guarding or rebound tenderness, normoactive bowel sounds.  Extremities: 1+ pitting edema bilaterally Neurologic: alert & oriented X3, cranial nerves II-XII intact, strength grossly intact, sensation intact to light touch     Hospital Course by problem list: 58 year old male with ESRD on HD, chronic liver cirrhosis who presents with constipation, emesis, abdominal pain and found to have hyperkalemia, worsening transaminits,  with pSBO.   Partial SBO vs ileus- Resolved. Pt presented with abdominal distension, diffuse abdominal pain, emesis, constipation, but normal passage of gas. Abdominal xray on 2/20 with ileus, repeat w/ improving partial small bowel obstruction vs ileus. He began having bowel movements and continued flatus. His diet was slowly advanced until he was tolerating a regular diet and he was ready for d/c home.  Recent HCAP - Pt was recently hospitalized and treated for HCAP from 2/11-2/15 and was to take Levaquin 250 mg Q 48 hr for 14 days however not able to afford medication. Pt afebrile on admission with mild leukocytosis. Cough and dyspnea at baseline. Pulmonary exam w/ some coarse breath sounds, however, improved since previous. He was started on Augmentin this admission; Levaquin was avoided due to his prolonged QTc -Continue Augmentin 250-125mg  daily for 7 days total (end date 2/29/15).   Anion Gap Metabolic Acidosis- Resolving. Likely baseline AG d/t ESRD. Pt with AG of 35 on admission with low-normal bicarbonate 19. AG 17 prior to discharge, HCO3 25. Lactic acid improved to 2.9 yesterday.  -Monitor BMP on outpatient basis.   Acute on Chronic Transaminitis in setting of decompensated liver cirrhosis - Improving. Pt with history of chronic hepatitis C infection. presented with elevated LFT's from baseline AST 347, ALT 118, Alk Phos 256, total bilirubin was elevated at  4.6 (direct 2.8). Etiology most likely due to decompensating liver cirrhosis. No fever, encephalopathy, significant leukocytosis, or worsening ascites to suggest SBP. Pt received paracentesis on 2/9 with 1L of clear fluid due to worsening abdominal distension.  -Check hepatic function panel at hospital f/u  Hyperkalemia - Resolved with HD. Pt presented with K of 5.8 on admission without EKG changes. Etiology most likely due to ESRD and possible dietary indiscretion.  -Check BMP as an outpatient -Continue HD on TTS schedule   Hypoglycemia - Resolved. Etiology most likely due to poor PO intake in the setting of his pSBO and inability to afford food (receives next payment on 2/23).   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 Etiology most likely due to secondary hyperparathyroidism from ESRD. Phos elevated to 7.0 prior to discharge; pt on Phoslo.  -Continue phoslo  -Continue weekly hectoral with HD   ESRD - Pt on TTS schedule at [facility].  -Continue renavite and phoslo   Prolonged QT - Pt with QTc of 643 on admission, worse from prior 466 on 2/11. Pt received levaquin on previous admission  -Avoiding QT prolonging medications (zofran, phenergan, levaquin, etc)   Hypertension - Normotensive today.  -Continue home metoprolol 50 mg qd + amlodipine 10 mg qd   Paroxysmal Atrial Fibrillation - Stable. Pt rate and rhythm controlled. CHADS score of 1 (HTN).  -Cont home daily 81 mg aspirin  -Cont home metoprolol 50 mg daily   Chronic Thrombocytopenia - At baseline of 100K.  -Monitor for bleeding  -SQ heparin for DVT prophylaxis   Chronic Microcytic Anemia - Stable. At baseline 10-11. Pt hemodynamically stable without active bleeding. Etiology most likely due to ESRD.   COPD - Stable. Currently w/o acute exacerbation.  -Albuterol inhaler PRN bronchospasm  -Consider resuming daily inhalers (records were previously obtained from [facility]  in [place])  PTSD - currently with stable mood.  -Continue home prazosin 4 mg daily for nightmares  -Continue home trazadone 50 mg daily at bedtime   Social Issues - Pt reports not being able to afford his medications and consequently on none of his home  medications. He reported that he was receive his monthly [facility] check 2/23 and would then be able to afford his medicaitons. He has an appointment at the [facility] in [place] on 3/6 to establish care there.     Discharge Vitals:   BP 139/84  Pulse 75  Temp(Src) 97.5 F (36.4 C) (Oral)  Resp 19  Wt 118 lb (53.524 kg)  SpO2 95%  Discharge Labs:  No results found for this or any previous visit (from the past 24 hour(s)).  Signed: Otho Bellows, MD 01/05/2014, 11:13 AM    Time Spent on Discharge: 35 minutes Services Ordered on Discharge: None Equipment Ordered on Discharge: None

## 2013-12-31 NOTE — Consult Note (Signed)
[facility] Renal Consultation Note    Indication for Consultation:  Management of ESRD/hemodialysis; anemia, hypertension/volume and secondary hyperparathyroidism  HPI: Carl Benson is a 58 y.o. male ESRD patient (TTS HD) with past medical history significant for COPD, atrial fibrillation, cirrhosis and chronic ascites secondary to Hepatitis C, medical non-compliance with hemodialysis, and frequent ED visits/admissions, most recently 2/11-2/15 for HCAP,  2/18 for abdominal distention, and again last night for constipation where he was discharged from the ED but returned stating that he did not receive his constipation medication. He was subsequently admitted this morning possible small bowel obstruction vs ileus in the setting of mild hyperkalemia. Kayexalate produced bowel movement. At the time of this encounter, he is resting in bed, somnolent, and minimally cooperative with ROS and physical exam. Much of this history is taken from notes.  Past Medical History  Diagnosis Date  . COPD (chronic obstructive pulmonary disease)   . Hypertension   . Hep C w/ coma, chronic   . Irregular heartbeat   . ESRD (end stage renal disease) on dialysis     /notes 11/11/2013  . Smoker unmotivated to quit   . Active smoker   . PTSD (post-traumatic stress disorder)   . Shortness of breath    Past Surgical History  Procedure Laterality Date  . Paracentesis  ~ 10/2013    [name]/notes 11/11/2013   Family History  Problem Relation Age of Onset  . Heart disease Mother   . Hypertension Mother   . Diabetes Mother    Social History:  reports that he has been smoking Cigarettes.  He has a 7.5 pack-year smoking history. He has never used smokeless tobacco. He reports that he uses illicit drugs ("Crack" cocaine, Cocaine, Marijuana, and Other-see comments). He reports that he does not drink alcohol. No Known Allergies Prior to Admission medications   Medication Sig Start Date End Date Taking?  Authorizing Provider  albuterol (PROVENTIL HFA;VENTOLIN HFA) 108 (90 BASE) MCG/ACT inhaler Inhale 1-2 puffs into the lungs every 6 (six) hours as needed for wheezing or shortness of breath.   Yes Historical Provider, MD  amLODipine (NORVASC) 10 MG tablet Take 10 mg by mouth daily.   Yes Historical Provider, MD  aspirin EC 81 MG tablet Take 81 mg by mouth daily.   Yes Historical Provider, MD  b complex-vitamin c-folic acid (NEPHRO-VITE) 0.8 MG TABS tablet Take 1 tablet by mouth daily.   Yes Historical Provider, MD  calcium acetate (PHOSLO) 667 MG capsule Take 1,334 mg by mouth 3 (three) times daily with meals.   Yes Historical Provider, MD  folic acid (FOLVITE) 1 MG tablet Take 1 mg by mouth daily.   Yes Historical Provider, MD  hydrOXYzine (ATARAX/VISTARIL) 25 MG tablet Take 25 mg by mouth every 6 (six) hours.   Yes Historical Provider, MD  lactulose (CEPHULAC) 10 G packet Take 1 packet (10 g total) by mouth 3 (three) times daily. 12/30/13  Yes Jennifer L Piepenbrink, PA-C  metoprolol (LOPRESSOR) 50 MG tablet Take 50 mg by mouth 2 (two) times daily.   Yes Historical Provider, MD  prazosin (MINIPRESS) 2 MG capsule Take 4 mg by mouth at bedtime.   Yes Historical Provider, MD  traZODone (DESYREL) 50 MG tablet Take 50 mg by mouth at bedtime.   Yes Historical Provider, MD   Current Facility-Administered Medications  Medication Dose Route Frequency Provider Last Rate Last Dose  . albuterol (PROVENTIL) (2.5 MG/3ML) 0.083% nebulizer solution 2.5 mg  2.5 mg Inhalation Q6H  PRN Otis Brace, MD      . amLODipine (NORVASC) tablet 10 mg  10 mg Oral Daily Marjan Rabbani, MD      . amoxicillin-clavulanate (AUGMENTIN) 250-125 MG per tablet 1 tablet  1 tablet Oral Daily Marjan Rabbani, MD      . aspirin EC tablet 81 mg  81 mg Oral Daily Marjan Rabbani, MD      . calcium acetate (PHOSLO) capsule 1,334 mg  1,334 mg Oral TID WC Marjan Rabbani, MD      . dextrose 50 % solution 50 mL  1 ampule Intravenous PRN Marjan  Rabbani, MD      . folic acid (FOLVITE) tablet 1 mg  1 mg Oral Daily Marjan Rabbani, MD      . heparin injection 5,000 Units  5,000 Units Subcutaneous 3 times per day Otis Brace, MD      . hydrOXYzine (ATARAX/VISTARIL) tablet 25 mg  25 mg Oral Q6H PRN Marjan Rabbani, MD      . lactulose (CHRONULAC) 10 GM/15ML solution 10 g  10 g Oral TID Marjan Rabbani, MD      . metoprolol (LOPRESSOR) tablet 50 mg  50 mg Oral BID Otis Brace, MD   50 mg at 12/31/13 0326  . multivitamin (RENA-VIT) tablet 1 tablet  1 tablet Oral QHS Marjan Rabbani, MD      . prazosin (MINIPRESS) capsule 4 mg  4 mg Oral QHS Marjan Rabbani, MD   4 mg at 12/31/13 0326  . promethazine (PHENERGAN) tablet 12.5 mg  12.5 mg Oral Q6H PRN Marjan Rabbani, MD      . sodium chloride 0.9 % injection 3 mL  3 mL Intravenous Q12H Marjan Rabbani, MD   3 mL at 12/31/13 0339  . traZODone (DESYREL) tablet 50 mg  50 mg Oral QHS Otis Brace, MD   50 mg at 12/31/13 0327   Labs: Basic Metabolic Panel:  Recent Labs Lab 12/30/13 2225 12/31/13 0350  NA 136* 138  K 5.8* 5.1  CL 82* 85*  CO2 19 24  GLUCOSE 39* 197*  BUN 49* 62*  CREATININE 6.06* 6.66*  CALCIUM 10.7* 9.8  PHOS  --  10.6*   Liver Function Tests:  Recent Labs Lab 12/30/13 2225 12/31/13 0052 12/31/13 0350  AST 347*  --  280*  ALT 118*  --  103*  ALKPHOS 256*  --  201*  BILITOT 4.6* 3.7* 2.9*  PROT 9.0*  --  7.4  ALBUMIN 3.5  --  2.8*  2.8*    Recent Labs Lab 12/30/13 2225  LIPASE 37   No results found for this basename: AMMONIA,  in the last 168 hours CBC:  Recent Labs Lab 12/30/13 2050 12/31/13 0350  WBC 10.8* 9.5  NEUTROABS 8.2*  --   HGB 12.9* 11.5*  HCT 38.3* 34.2*  MCV 80.6 79.5  PLT 170 151   Cardiac Enzymes: No results found for this basename: CKTOTAL, CKMB, CKMBINDEX, TROPONINI,  in the last 168 hours CBG:  Recent Labs Lab 12/30/13 2321 12/30/13 2343 12/31/13 0045 12/31/13 0803  GLUCAP 55* 66* 67* 151*    INR: @labcntip (inr:3)@ Iron Studies: No results found for this basename: IRON, TIBC, TRANSFERRIN, FERRITIN,  in the last 72 hours Studies/Results: Dg Abd 1 View  12/30/2013   CLINICAL DATA:  Constipation.  Mid abdominal pain.  EXAM: ABDOMEN - 1 VIEW  COMPARISON:  US PARACENTESIS dated 12/09/2013; DG ABD ACUTE W/CHEST dated 11/20/2013  FINDINGS: Centralized mildly prominent bowel loops, predominately small bowel loops.  This centralization likely related to ascites. Mild gaseous distention may reflect mild ileus. No organomegaly. No free air.  IMPRESSION: Centralized, mildly prominent small bowel loops. Suspect ascites and ileus.   Electronically Signed   By: Charlett Nose M.D.   On: 12/30/2013 04:06   Dg Abd Acute W/chest  12/30/2013   CLINICAL DATA:  One day history of nausea and vomiting. Constipation.  EXAM: ACUTE ABDOMEN SERIES (ABDOMEN 2 VIEW & CHEST 1 VIEW) 12/30/2013 2049 hr:  COMPARISON:  DG ABDOMEN 1V dated 12/30/2013 0349 hr; DG CHEST 2 VIEW dated 12/22/2013; DG CHEST 2 VIEW dated 12/21/2013; DG CHEST 1V PORT dated 12/19/2013; DG ABD ACUTE W/CHEST dated 11/20/2013  FINDINGS: Mild gaseous distention of a solitary loop of small bowel in the left mid abdomen, with improvement in the bowel gas pattern since the examination earlier today. Gas in normal caliber colon. Scattered colonic and small bowel air-fluid levels on the decubitus image. No free intraperitoneal air. Phleboliths low in both sides of the pelvis.  Stable marked cardiomegaly. Pulmonary venous hypertension without overt edema. Scarring at the right lung base at the site of the prior pneumonia. Lungs otherwise clear. No localized airspace consolidation. No pleural effusions. No pneumothorax.  IMPRESSION: 1. Improving partial small bowel obstruction or ileus, with fewer distended loops of small bowel in the abdomen when compared to the examination earlier same date. No free intraperitoneal air. 2. Stable marked cardiomegaly. Pulmonary venous  hypertension without overt edema. Scarring at the right lung base. No acute cardiopulmonary disease.   Electronically Signed   By: Hulan Saas M.D.   On: 12/30/2013 21:13    ROS: Unable to complete, patient uncooperative   Physical Exam: Filed Vitals:   12/31/13 0119 12/31/13 0225 12/31/13 0534 12/31/13 0831  BP: 161/107 155/92 123/69 104/65  Pulse: 75 78 73 74  Temp:  97.2 F (36.2 C) 97.5 F (36.4 C) 97.9 F (36.6 C)  TempSrc:  Oral Oral Oral  Resp: 20 17 17 18   Weight:  55.384 kg (122 lb 1.6 oz)    SpO2: 97% 100% 95% 97%     General: Chronically ill-appearing,  no acute distress. Head: Normocephalic, atraumatic, sclera non-icteric, mucus membranes are moist Neck: Supple. JVD not elevated. Lungs: Mostly clear. Decreased at bases. Breathing is unlabored. Poor effort/exam limited Heart: RRR , 2/6 murmur,  No rubs, or gallops appreciated. Abdomen: Distended with normoactive bowel sounds.  M-S:  Strength and tone appear normal for age. Lower extremities:without edema or ischemic changes, dry cracked heels  Neuro: Somnolent, Moves all extremities spontaneously. Psych:  Somnolent, minimally cooperative Dialysis Access: R AVG + bruit  Dialysis Orders: Center: AF on TTS. EDW 55.5 kg HD Bath 2K/2.25Ca  Time 4:00 Heparin 3000. Access R AVG BFR 400 DFR 800    Hectorol 2 mcg IV/HD Epogen 3800   Units IV/HD  Venofer  100 mg IV/HD thru 2/24   Recent labs - Hgb 11, Tsat 22%, Phos 10.1, PTH 507 in Jan, Alb 3.3 in Jan  Assessment/Plan: 1. Hyperkalemia - K+ 5.8 on admit, now 5.1. No EKG changes noted. Given kayexalate in the ED, would not give more. For hemodialysis today per op sched. 2. Constipation/ sbo vs ileus - Mgmt per primary. +BM this admit. 3. ESRD -  TTS, HD today 4. Hertension/volume  - SBPs 110s-120s on amlodipine10 and metoprolol 50 BID which I will lower to 25 BID for now given soft BPs. Try for  5. Metabolic bone disease -  Ca 9.8 (10.8 corrected).  Phos poorly  controlled at 10.6, consistent with op labs. Hold hectorol 1 and Phoslo 2 ac for hypercalcemia. Try fosrenol once eating given GI issues. Use low Ca bath and follow labs.  6. Anemia - Hgb 11.5 consistent with op labs. Last Tsat 22%. IV Fe through 2/24 per op schedule 7. Malnutrition - Alb 2.8. On clears. Renal/carb mod diet when appropriate. Multivitamin, prostat BID 8. DM - Mgmt per primary. Hypoglycemia this admit likely due to poor PO intake. 9. Cirrhosis/Transaminitis - Chronic ascites requiring frequent paracentesis, most recently on 2/9. 10. PAF - SR this admit. On BB, ASA 11. Medical non-compliance - due in part to # 10 below. Rarely runs full HD treatment 12. Difficult social situation - Disabled veteran who recently relocated from [place]. Estranged from family. Homeless with hx of substance abuse. Resides in extended stay hotel, no money for food at times. Multiple medical problems that have gone unmanaged as a result. Has appt on 3/6 at [facility] to hopefully establish care.    Claud Kelp, PA-C [facility] Pager [phone] 12/31/2013, 9:44 AM  Renal Attending: Details as articulated above in the note by Ms. Broadus John.  This appears to be a complex situation with the predominant piece being social.  Will assist with hemodialysis treatments. Niralya Ohanian C

## 2013-12-31 NOTE — Progress Notes (Signed)
Tolerating hemodialysis treatment.  No hemodynamic issues. Carl Benson C

## 2013-12-31 NOTE — H&P (Signed)
Date: 12/31/2013               Patient Name:  Carl Benson MRN: [id number]  DOB: Sep 07, 1956 Age / Sex: 58 y.o., male   PCP: No Pcp Per Patient         Medical Service: Internal Medicine Teaching Service         Attending Physician: Dr. Thayer Headings, MD    First Contact: Dr. Natasha Bence Pager: [phone]  Second Contact: Dr. 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 The patient notes a 1-day history of vomiting, with 4-5 episodes of non-bloody vomiting on the day of admission. He notes associated "soft" bowel movements, 2-3/day for the last 2 days with chronic diffuse abdominal pain and distension (unclear if worse than baseline), as well as poor PO intake during this time period. The patient also notes increased "urge" to defecate during this time period, but no fevers, chills, sick contacts, rhinorrhea, or change in his chronic non-productive cough. The patient notes mildly increased DOE and LE edema since his last HD session 2/19. The patient has chronic ascites, secondary to cirrhosis, with last paracentesis 2/9, and chronic abdominal pain. The patient was recently discharged 2/15 after an admission for HCAP, and he has been non-compliant with outpatient antibiotics. In the ED, the patient was found to have a potassium of 5.9, and elevated LFT's, prompting admission    Meds: Current Facility-Administered Medications  Medication Dose Route Frequency Provider Last Rate Last Dose  . albuterol (PROVENTIL) (2.5 MG/3ML) 0.083% nebulizer solution 2.5 mg  2.5 mg Inhalation Q6H PRN Sosha Shepherd, MD      . amLODipine (NORVASC) tablet 10 mg  10 mg Oral  Daily Hanan Mcwilliams, MD      . amoxicillin-clavulanate (AUGMENTIN) 250-125 MG per tablet 1 tablet  1 tablet Oral Daily Hancel Ion, MD      . aspirin EC tablet 81 mg  81 mg Oral Daily Tymon Nemetz, MD      . calcium acetate (PHOSLO) capsule 1,334 mg  1,334 mg Oral TID WC Svara Twyman, MD      . folic acid (FOLVITE) tablet 1 mg  1 mg Oral Daily Micah Barnier, MD      . heparin injection 5,000 Units  5,000 Units Subcutaneous 3 times per day Juluis Mire, MD      . hydrOXYzine (ATARAX/VISTARIL) tablet 25 mg  25 mg Oral Q6H PRN Bhakti Labella, MD      . lactulose (CHRONULAC) 10 GM/15ML solution 10 g  10 g Oral TID Ricarda Frame Videl Nobrega, MD      . metoprolol (LOPRESSOR) tablet 50 mg  50 mg Oral BID Tarrance Januszewski, MD      . multivitamin (RENA-VIT) tablet 1 tablet  1 tablet Oral QHS Kortlyn Koltz, MD      . ondansetron (ZOFRAN) injection 4 mg  4 mg Intravenous Once Montine Circle, PA-C      . prazosin (MINIPRESS) capsule 4 mg  4 mg Oral QHS Jonuel Butterfield, MD      . promethazine (PHENERGAN) tablet 12.5 mg  12.5 mg Oral Q6H PRN Juluis Mire, MD      .  sodium chloride 0.9 % injection 3 mL  3 mL Intravenous Q12H Lydon Vansickle, MD      . traZODone (DESYREL) tablet 50 mg  50 mg Oral QHS Juluis Mire, MD        Allergies: Allergies as of 12/30/2013  . (No Known Allergies)   Past Medical History  Diagnosis Date  . COPD (chronic obstructive pulmonary disease)   . Hypertension   . Hep C w/ coma, chronic   . Irregular heartbeat   . ESRD (end stage renal disease) on dialysis     /notes 11/11/2013  . Smoker unmotivated to quit   . Active smoker   . PTSD (post-traumatic stress disorder)   . Shortness of breath    Past Surgical History  Procedure Laterality Date  . Paracentesis  ~ 10/2013    Archie Endo 11/11/2013   Family History  Problem Relation Age of Onset  . Heart disease Mother   . Hypertension Mother   . Diabetes Mother    History   Social History  . Marital Status: Single     Spouse Name: N/A    Number of Children: N/A  . Years of Education: GED   Occupational History  . Not on file.   Social History Main Topics  . Smoking status: Current Every Day Smoker -- 0.50 packs/day for 15 years    Types: Cigarettes  . Smokeless tobacco: Never Used  . Alcohol Use: No     Comment: DENIES (clean 6 mo) Becomes agitated when asked about EtOH use  . Drug Use: Yes    Special: "Crack" cocaine, Cocaine, Marijuana, Other-see comments     Comment: states he has not had any illegal substances in 6 months  . Sexual Activity: Not Currently   Other Topics Concern  . Not on file   Social History Narrative   Patient receives income through the [employer] [place], plugged in due to service in [place]. He is estranged from his sons. He does not have a HCPOA.    Review of Systems: General: no fevers, chills  Skin: no rash  HEENT: no blurry vision, hearing changes, sore throat  Pulm: no dyspnea, wheezing  CV: no chest pain, palpitations  Abd: see HPI  GU: anuric at baseline  Ext: no arthralgias, myalgias  Neuro: no weakness, numbness, or tingling   Physical Exam: Blood pressure 155/92, pulse 78, temperature 97.2 F (36.2 C), temperature source Oral, resp. rate 17, weight 55.384 kg (122 lb 1.6 oz), SpO2 100.00%.  General: alert, moderately cooperative, though asked [place] to leave the room at one point HEENT: PERRL, EOMI, oropharynx non-erythematous Neck: supple Lungs: clear to ascultation bilaterally, normal work of respiration, no wheezes, rales, ronchi Heart: regular rate and rhythm Abdomen: soft, moderately distended, mildly diffusely ttp though with no focal tenderness, no involuntary guarding or rebound tenderness, normoactive bowel sounds.  Extremities: 1+ pitting edema bilaterally Neurologic: alert & oriented X3, cranial nerves II-XII intact, strength grossly intact, sensation intact to light touch   Lab results: Basic Metabolic Panel:  Recent Labs  12/30/13 2225  NA 136*    K 5.8*  CL 82*  CO2 19  GLUCOSE 39*  BUN 49*  CREATININE 6.06*  CALCIUM 10.7*   Liver Function Tests:  Recent Labs  12/30/13 2225 12/31/13 0052  AST 347*  --   ALT 118*  --   ALKPHOS 256*  --   BILITOT 4.6* 3.7*  PROT 9.0*  --   ALBUMIN 3.5  --     Recent  Labs  12/30/13 2225  LIPASE 37   CBC:  Recent Labs  12/30/13 2050  WBC 10.8*  NEUTROABS 8.2*  HGB 12.9*  HCT 38.3*  MCV 80.6  PLT 170   CBG:  Recent Labs  12/30/13 2321 12/30/13 2343 12/31/13 0045  GLUCAP 55* 66* 67*    Alcohol Level:  Recent Labs  12/31/13 0052  ETH <11    Imaging results:  Dg Abd 1 View  12/30/2013   CLINICAL DATA:  Constipation.  Mid abdominal pain.  EXAM: ABDOMEN - 1 VIEW  COMPARISON:  US PARACENTESIS dated 12/09/2013; DG ABD ACUTE W/CHEST dated 11/20/2013  FINDINGS: Centralized mildly prominent bowel loops, predominately small bowel loops. This centralization likely related to ascites. Mild gaseous distention may reflect mild ileus. No organomegaly. No free air.  IMPRESSION: Centralized, mildly prominent small bowel loops. Suspect ascites and ileus.   Electronically Signed   By: Rolm Baptise M.D.   On: 12/30/2013 04:06   Dg Abd Acute W/chest  12/30/2013   CLINICAL DATA:  One day history of nausea and vomiting. Constipation.  EXAM: ACUTE ABDOMEN SERIES (ABDOMEN 2 VIEW & CHEST 1 VIEW) 12/30/2013 2049 hr:  COMPARISON:  DG ABDOMEN 1V dated 12/30/2013 0349 hr; DG CHEST 2 VIEW dated 12/22/2013; DG CHEST 2 VIEW dated 12/21/2013; DG CHEST 1V PORT dated 12/19/2013; DG ABD ACUTE W/CHEST dated 11/20/2013  FINDINGS: Mild gaseous distention of a solitary loop of small bowel in the left mid abdomen, with improvement in the bowel gas pattern since the examination earlier today. Gas in normal caliber colon. Scattered colonic and small bowel air-fluid levels on the decubitus image. No free intraperitoneal air. Phleboliths low in both sides of the pelvis.  Stable marked cardiomegaly. Pulmonary venous  hypertension without overt edema. Scarring at the right lung base at the site of the prior pneumonia. Lungs otherwise clear. No localized airspace consolidation. No pleural effusions. No pneumothorax.  IMPRESSION: 1. Improving partial small bowel obstruction or ileus, with fewer distended loops of small bowel in the abdomen when compared to the examination earlier same date. No free intraperitoneal air. 2. Stable marked cardiomegaly. Pulmonary venous hypertension without overt edema. Scarring at the right lung base. No acute cardiopulmonary disease.   Electronically Signed   By: Evangeline Dakin M.D.   On: 12/30/2013 21:13    Other results:  EKG:  Rate: 78, sinus rhythm  PR interval: 134 QRS duration 98 QTc 643 (prolonged QT more than prior EKG)   Assessment: 58 year old male with ESRD on HD, chronic liver cirrhosis who presents with constipation, emesis, abdominal pain and found to have hyperkalemia, worsening transaminits, with improving SBO vs ileus.   Plan:    Hyperkalemia - Pt presented with K of 5.8 on admission without EKG changes. Pt received kayexalate 15 g with resulting  BM . Etiology most likely due to ESRD and possible dietary indiscretion.   -Monitor BMP  -Obtain Mg levels -Administer kayexalate as needed  -Continue HD on TTS schedule  Hypoglycemia - Pt presented with glucose of 39 on admission and received 1 amp D50 with improvement to 67.  Pt with no history of DM. Etiology most likely due to poor PO intake due to inability to afford food (receives payment on 2/23). -Administer D50 as needed for CBG<70  -Monitor CBG   Acute on Chronic Transaminitis in setting of decompensated liver cirrhosis - Pt presented with elevated LFT's from baseline AST 347, ALT 118, Alk Phos 256, total bilirubin was elevated at 4.6 (direct 2.8). Pt  with history of chronic hepatitis C infection. Etiology most likely due to decompensating liver cirrhosis. No fever, encephalopathy, significant  leukocytosis, or worsening ascites to suggest SBP. Pt received paracentesis on 2/9 with 1L of clear fluid due to worsening abdominal distension.  -Obtain ethanol (wnl), acetaminophen level, UDS -Obtain INR, liver function panel in AM -If worsening ascites/abdominal pain/distension, consider paracentesis   Anion Gap Metabolic Acidosis- Pt with AG of 35 on admission with low-normal bicarbonate 19. Etiology most likely due to hyperphosphatemia in setting of ESRD vs starvation ketoacidosis (poor PO intake as unable to afford food). Ethanol levels were normal. Glucose was below normal. Pt does not appear uremic. No signs of infection to suggest lactic acidosis.  -Monitor BMP  -Obtain renal function panel  -Obtain lactic acid levels   Constipation in setting of SBO vs Ileus most likely due to hyperkalemia - Pt with abdominal distension, diffuse abdominal  pain, emesis, constipation, and normal passage of gas. Abdominal xray on 2/20 with improving partial small bowel obstruction or ileus, with fewer distended loops of small bowel in the abdomen when compared to the  examination earlier same date. No free intraperitoneal air. Etiology most likely due to hyperkalemia. Pt received kayexalate and had BM. 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 Due to concomitant hyperkalemia and hypoglycemia adrenal insufficieny possible however pt not hypotensive or hyponatremic.  -Obtain phosphorus levels  -Continue phoslo once able to tolerate diet  -Continue weekly hectoral with HD -Continue to monitor  ESRD - Pt on TTS schedule at [facility]. Pt reports attending session on 7/74 without complication. Cr of 6.06 on  admission. Hyperkalemia on admission without EKG changes and no pulmonary edema seen on abdomen imaging to warrant emergent HD. -Nephrology consult in AM  -HD on 2/21 (TTS schedule)  -Continue renavite and phoslo once able to tolerate diet   Recent HCAP - Pt was recently hospitalized and treated for HCAP from 2/11-2/15 and was to take Levaquin 250 mg Q 48 hr for 14 days however not able to afford medication. Pt afebrile on admission with mild leukocytosis. Cough and dyspnea at baseline.  -Avoid Levaquin in setting of prolonged QT -Augmentin 250-$RemoveBeforeDEI'125mg'ExUuHMbWmhpLeIDO$  daily after HD (pt with h/o of leaving AMA) -Consider 2-view chest xray  -Albuterol inhaler PRN bronchospasm  Prolonged QT  - Pt with QTc of 643 on admission, worse from prior 466 on 2/11. Pt received levaquin on previous admission  -Avoid QT prolonging medications (zofran, levaquin, etc)  Hypertension - Pt hypertensive on admission.  -Resume home metoprolol 50 mg daily  -Resume home amlodipine 10 mg daily   Paroxysmal Atrial Fibrillation - Pt rate and rhythm controlled. Pt on AP therapy.  CHADS score of 1 (HTN).  -Resume home daily 81 mg aspirin  -Resume home metoprolol 50 mg daily  -Monitor on telemetry  Chronic Thrombocytopenia - Pt with platelet count of 170K on admission, above baseline of 100K.  -Monitor for bleeding  -SQ heparin for DVT prophylaxis   Chronic Microcytic Anemia -  Pt with Hg of 12.9 on admission, above baseline 10-11. Pt hemodynamically stable without active bleeding. Etiology most likely due to CKD. Pt receives ESA and erythropoetin weekly at HD. -Monitor for bleeding -Transfuse if Hg <7  COPD -  currently with no acute exacerbation. Pt not on home oxygen. Previously on daily inhalers. -Albuterol inhaler PRN bronchospasm -Consider resuming daily inhalers (records were previously obtained from [place] in [place])  PTSD - currently with stable mood. -Resume home prazosin 4 mg daily for nightmares -Resume home  trazadone 50 mg daily at bedtime  Social Issues - Pt reports not being able to afford his medications and consequently on none of his home medications. He reports he will receive his monthly [facility] check on 2/23.  -Obtain CM/SW consults  Diet: Clear liquid DVT Ppx: SQ heparin  Code: Full   Dispo: Disposition is deferred at this time, awaiting improvement of current medical problems. Anticipated discharge in approximately 2-3 day(s).   The patient does not have a current PCP (No Pcp Per Patient) and does need an [facility] hospital follow-up appointment after discharge.  The patient does have transportation limitations that hinder transportation to clinic appointments.  Signed: Juluis Mire, MD 12/31/2013, 3:08 AM

## 2013-12-31 NOTE — Progress Notes (Addendum)
INITIAL NUTRITION ASSESSMENT  DOCUMENTATION CODES Per approved criteria  -Not Applicable   INTERVENTION: Resource Breeze po TID, each supplement provides 250 kcal and 9 grams of protein Continue Prostat liquid protein po 30 ml BID with meals, each supplement provides 100 kcal, 15 grams protein  RD to follow for nutrition care plan  NUTRITION DIAGNOSIS: Increased nutrient needs related to ESRD on HD as evidenced by estimated nutrition needs  Goal: Pt to meet >/= 90% of their estimated nutrition needs   Monitor:  PO & supplemental intake, weight, labs, I/O's  Reason for Assessment: Malnutrition Screening Tool Report  58 y.o. 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  Difficult social situation - Disabled veteran who recently relocated from [place]. Estranged from family. Homeless with hx of substance abuse. Resides in extended stay hotel, no money for food at times.  Patient seen during HD session, however, sleeping soundly -- RD did not wake; per admission nutrition screen pt reported eating poorly because of a decreased appetite; noted nausea & vomiting PTA; weight highly variable per readings; noted patient with chronic ascites & suspect pt's wt fluctuates; would benefit from addition of oral nutrition supplement -- will order.  RD unable to complete Nutrition Focused Physical Exam at this time.  RD suspects some level of malnutrition, however, unable to identify at this time.  Height: Ht Readings from Last 1 Encounters:  12/21/13 5\' 5"  (1.651 m)    Weight: Wt Readings from Last 1 Encounters:  12/31/13 120 lb 5.9 oz (54.6 kg)    Ideal Body Weight: 154 lb  %  Ideal Body Weight: 77%  Wt Readings from Last 20 Encounters:  12/31/13 120 lb 5.9 oz (54.6 kg)  12/25/13 121 lb 14.6 oz (55.3 kg)  12/19/13 140 lb (63.504 kg)  12/12/13 139 lb 8.8 oz (63.3 kg)  12/04/13 138 lb (62.596 kg)  11/29/13 138 lb 14.2 oz (63 kg)  11/21/13 122 lb 6.4 oz (55.52 kg)  11/17/13 130 lb 8.2 oz (59.2 kg)    Usual Body Weight: unable to obtain  % Usual Body Weight: ---  BMI:  Body mass index is 20.03 kg/(m^2).  Estimated Nutritional Needs: Kcal: 1700-1900 Protein: 80-90 gm Fluid: 1200 ml  Skin: Intact  Diet Order: Clear Liquid  EDUCATION NEEDS: -No education needs identified at this time   Intake/Output Summary (Last 24 hours) at 12/31/13 1405 Last data filed at 12/31/13 1346  Gross per 24 hour  Intake    120 ml  Output      2 ml  Net    118 ml    Labs:   Recent Labs Lab 12/30/13 2225 12/31/13 0350  NA 136* 138  K 5.8* 5.1  CL 82* 85*  CO2 19 24  BUN 49* 62*  CREATININE 6.06* 6.66*  CALCIUM 10.7* 9.8  MG  --  2.7*  PHOS  --  10.6*  GLUCOSE 39* 197*    CBG (last 3)   Recent Labs  12/30/13 2343 12/31/13 0045 12/31/13 0803  GLUCAP 66* 67* 151*    Scheduled Meds: . amLODipine  10 mg Oral Daily  . amoxicillin-clavulanate  1 tablet Oral Daily  . aspirin EC  81 mg Oral Daily  .  feeding supplement (PRO-STAT SUGAR FREE 64)  30 mL Oral BID  . ferric gluconate (FERRLECIT/NULECIT) IV  125 mg Intravenous Q T,Th,Sa-HD  . folic acid  1 mg Oral Daily  . heparin  5,000 Units Subcutaneous 3 times per day  . lactulose  10 g Oral TID  . metoprolol  25 mg Oral BID  . multivitamin  1 tablet Oral QHS  . prazosin  4 mg Oral QHS  . sodium chloride  3 mL Intravenous Q12H  . traZODone  50 mg Oral QHS    Continuous Infusions:   Past Medical History  Diagnosis Date  . COPD (chronic obstructive pulmonary disease)   . Hypertension   . Hep C w/ coma, chronic   . Irregular heartbeat   . ESRD (end stage renal disease) on dialysis     /notes  11/11/2013  . Smoker unmotivated to quit   . Active smoker   . PTSD (post-traumatic stress disorder)   . Shortness of breath     Past Surgical History  Procedure Laterality Date  . Paracentesis  ~ 10/2013    [name] 11/11/2013    Maureen Chatters, RD, LDN Pager #: [phone] After-Hours Pager #: [phone]

## 2014-01-01 LAB — COMPREHENSIVE METABOLIC PANEL
ALBUMIN: 2.3 g/dL — AB (ref 3.5–5.2)
ALT: 67 U/L — ABNORMAL HIGH (ref 0–53)
AST: 134 U/L — AB (ref 0–37)
Alkaline Phosphatase: 163 U/L — ABNORMAL HIGH (ref 39–117)
BILIRUBIN TOTAL: 1.7 mg/dL — AB (ref 0.3–1.2)
BUN: 35 mg/dL — AB (ref 6–23)
CALCIUM: 9 mg/dL (ref 8.4–10.5)
CHLORIDE: 96 meq/L (ref 96–112)
CO2: 26 mEq/L (ref 19–32)
CREATININE: 4.51 mg/dL — AB (ref 0.50–1.35)
GFR calc Af Amer: 15 mL/min — ABNORMAL LOW (ref >90)
GFR calc non Af Amer: 13 mL/min — ABNORMAL LOW (ref >90)
Glucose, Bld: 84 mg/dL (ref 70–99)
Potassium: 3.7 mEq/L (ref 3.7–5.3)
Sodium: 140 mEq/L (ref 137–147)
TOTAL PROTEIN: 6.3 g/dL (ref 6.0–8.3)

## 2014-01-01 LAB — LACTIC ACID, PLASMA: LACTIC ACID, VENOUS: 2.9 mmol/L — AB (ref 0.5–2.2)

## 2014-01-01 LAB — GLUCOSE, CAPILLARY: GLUCOSE-CAPILLARY: 100 mg/dL — AB (ref 70–99)

## 2014-01-01 MED ORDER — OXYCODONE HCL 5 MG PO TABS
5.0000 mg | ORAL_TABLET | Freq: Once | ORAL | Status: AC
  Administered 2014-01-02: 5 mg via ORAL
  Filled 2014-01-01: qty 1

## 2014-01-01 MED ORDER — CALCIUM ACETATE 667 MG PO CAPS
1334.0000 mg | ORAL_CAPSULE | Freq: Three times a day (TID) | ORAL | Status: DC
  Administered 2014-01-01 – 2014-01-02 (×3): 1334 mg via ORAL
  Filled 2014-01-01 (×6): qty 2

## 2014-01-01 NOTE — Progress Notes (Signed)
Subjective: Pt now having bowel movements and passing gas from below. No vomiting, but he endorsed some nausea this morning. Still with minimal po intake. Pt had HD yesterday and denies SOB or chest pain.   Objective: Vital signs in last 24 hours: Filed Vitals:   12/31/13 2128 01/01/14 0441 01/01/14 0500 01/01/14 0929  BP: 122/81 110/78  113/74  Pulse: 78 70  81  Temp: 97.4 F (36.3 C) 97.2 F (36.2 C)  97.5 F (36.4 C)  TempSrc: Axillary Axillary  Oral  Resp: $Remo'18 16  16  'yWVFc$ Weight: 116 lb 12.8 oz (52.98 kg)  115 lb 5 oz (52.305 kg)   SpO2: 98% 95%  94%   Weight change: -1 lb 11.7 oz (-0.784 kg)  Intake/Output Summary (Last 24 hours) at 01/01/14 1133 Last data filed at 01/01/14 0944  Gross per 24 hour  Intake    240 ml  Output   1905 ml  Net  -1665 ml   Vitals reviewed. General: Cachetic male. Resting in bed, NAD HEENT: PERRL, EOMI, no scleral icterus Cardiac: RRR, no rubs, murmurs or gallops Pulm: Diffuse wheezing, with rhonchi at the right base Abd: soft, nontender. +Distension, BS present Ext: warm and well perfused, no peripheral edema Neuro: alert and oriented X3, cranial nerves II-XII grossly intact, nonfocal  Lab Results: Basic Metabolic Panel:  Recent Labs Lab 12/31/13 0350 01/01/14 0835  NA 138 140  K 5.1 3.7  CL 85* 96  CO2 24 26  GLUCOSE 197* 84  BUN 62* 35*  CREATININE 6.66* 4.51*  CALCIUM 9.8 9.0  MG 2.7*  --   PHOS 10.6*  --    Liver Function Tests:  Recent Labs Lab 12/31/13 0350 01/01/14 0835  AST 280* 134*  ALT 103* 67*  ALKPHOS 201* 163*  BILITOT 2.9* 1.7*  PROT 7.4 6.3  ALBUMIN 2.8*  2.8* 2.3*    Recent Labs Lab 12/30/13 2225  LIPASE 37   CBC:  Recent Labs Lab 12/30/13 2050 12/31/13 0350  WBC 10.8* 9.5  NEUTROABS 8.2*  --   HGB 12.9* 11.5*  HCT 38.3* 34.2*  MCV 80.6 79.5  PLT 170 151   CBG:  Recent Labs Lab 12/30/13 2321 12/30/13 2343 12/31/13 0045 12/31/13 0803 01/01/14 0802  GLUCAP 55* 66* 67* 151*  100*    Coagulation:  Recent Labs Lab 12/31/13 0350  LABPROT 21.4*  INR 1.92*    Alcohol Level:  Recent Labs Lab 12/31/13 0052  ETH <11    Micro Results: Recent Results (from the past 240 hour(s))  CULTURE, BLOOD (ROUTINE X 2)     Status: None   Collection Time    12/22/13  5:15 PM      Result Value Ref Range Status   Specimen Description BLOOD LEFT HAND   Final   Special Requests BOTTLES DRAWN AEROBIC ONLY 2 CC   Final   Culture  Setup Time     Final   Value: 12/22/2013 20:38     Performed at [facility]   Culture     Final   Value: NO GROWTH 5 DAYS     Performed at [facility]   Report Status 12/28/2013 FINAL   Final  CULTURE, BLOOD (ROUTINE X 2)     Status: None   Collection Time    12/22/13  6:11 PM      Result Value Ref Range Status   Specimen Description BLOOD LEFT ARM   Final   Special Requests  Final   Value: BOTTLES DRAWN AEROBIC AND ANAEROBIC RED 2CC BLUE 10CC   Culture  Setup Time     Final   Value: 12/23/2013 00:41     Performed at [facility]   Culture     Final   Value: NO GROWTH 5 DAYS     Performed at [facility]   Report Status 12/29/2013 FINAL   Final  MRSA PCR SCREENING     Status: None   Collection Time    12/31/13  3:03 AM      Result Value Ref Range Status   MRSA by PCR NEGATIVE  NEGATIVE Final   Comment:            The GeneXpert MRSA Assay (FDA     approved for NASAL specimens     only), is one component of a     comprehensive MRSA colonization     surveillance program. It is not     intended to diagnose MRSA     infection nor to guide or     monitor treatment for     MRSA infections.   Studies/Results: Dg Abd Acute W/chest  12/30/2013   CLINICAL DATA:  One day history of nausea and vomiting. Constipation.  EXAM: ACUTE ABDOMEN SERIES (ABDOMEN 2 VIEW & CHEST 1 VIEW) 12/30/2013 2049 hr:  COMPARISON:  DG ABDOMEN 1V dated 12/30/2013 0349 hr; DG CHEST 2 VIEW dated 12/22/2013; DG CHEST 2 VIEW dated  12/21/2013; DG CHEST 1V PORT dated 12/19/2013; DG ABD ACUTE W/CHEST dated 11/20/2013  FINDINGS: Mild gaseous distention of a solitary loop of small bowel in the left mid abdomen, with improvement in the bowel gas pattern since the examination earlier today. Gas in normal caliber colon. Scattered colonic and small bowel air-fluid levels on the decubitus image. No free intraperitoneal air. Phleboliths low in both sides of the pelvis.  Stable marked cardiomegaly. Pulmonary venous hypertension without overt edema. Scarring at the right lung base at the site of the prior pneumonia. Lungs otherwise clear. No localized airspace consolidation. No pleural effusions. No pneumothorax.  IMPRESSION: 1. Improving partial small bowel obstruction or ileus, with fewer distended loops of small bowel in the abdomen when compared to the examination earlier same date. No free intraperitoneal air. 2. Stable marked cardiomegaly. Pulmonary venous hypertension without overt edema. Scarring at the right lung base. No acute cardiopulmonary disease.   Electronically Signed   By: Evangeline Dakin M.D.   On: 12/30/2013 21:13   Medications: I have reviewed the patient's current medications. Scheduled Meds: . amLODipine  10 mg Oral Daily  . amoxicillin-clavulanate  1 tablet Oral Daily  . aspirin EC  81 mg Oral Daily  . feeding supplement (PRO-STAT SUGAR FREE 64)  30 mL Oral BID  . feeding supplement (RESOURCE BREEZE)  1 Container Oral TID WC  . ferric gluconate (FERRLECIT/NULECIT) IV  125 mg Intravenous Q T,Th,Sa-HD  . folic acid  1 mg Oral Daily  . heparin  5,000 Units Subcutaneous 3 times per day  . lactulose  10 g Oral TID  . metoprolol  25 mg Oral BID  . multivitamin  1 tablet Oral QHS  . prazosin  4 mg Oral QHS  . sodium chloride  3 mL Intravenous Q12H  . traZODone  50 mg Oral QHS   Continuous Infusions:  PRN Meds:.albuterol, dextrose, hydrOXYzine  Assessment/Plan: 58 year old male with ESRD on HD, chronic liver cirrhosis  who presents with constipation, emesis, abdominal pain and found to have  hyperkalemia, worsening transaminits, with improving pSBO.   Constipation in setting of pSBO most likely due to hyperkalemia - Pt with abdominal distension, diffuse abdominal pain, emesis, constipation, and normal passage of gas. Abdominal xray on 2/20 with improving partial small bowel obstruction, with fewer distended loops of small bowel in the abdomen when compared to the  examination earlier same date. No free intraperitoneal air. Possibly from hyperkalemia. Poor po intake but pt having bowel movements now and flatus, advancing diet, with plans to d/c once tolerating regular diet.  -Cont home lactulose 10g TID  -Advance as tolerated   Anion Gap Metabolic Acidosis- Improving. Pt with AG of 35 on admission with low-normal bicarbonate 19. Etiology most likely due to hyperphosphatemia in setting of ESRD vs starvation ketoacidosis (poor PO intake as unable to afford food). Ethanol levels were normal. Glucose was below normal. Pt does not appear uremic. No signs of infection to suggest lactic acidosis; however lactic acid 6.3 on admission, likely from SBO. He continues to have bowel movements without pain, so ischemic bowel is unlikely. AG 18 this morning with improved bicarb, appears to be around his baseline, rechecking lactic acid.  -Monitor BMP  -Rechecking lactic acid levels  Acute on Chronic Transaminitis in setting of decompensated liver cirrhosis - Improving. Pt presented with elevated LFT's from baseline AST 347, ALT 118, Alk Phos 256, total bilirubin was elevated at 4.6 (direct 2.8). Pt with history of chronic hepatitis C infection. Etiology most likely due to decompensating liver cirrhosis. No fever, encephalopathy, significant leukocytosis, or worsening ascites to suggest SBP. Pt received paracentesis on 2/9 with 1L of clear fluid due to worsening abdominal distension. Will continue to monitor for now.  Hyperkalemia -  Resolved after HD. Pt presented with K of 5.8 on admission without EKG changes. Pt received kayexalate 15 g in the ED with resulting BM . Etiology most likely due to ESRD and possible dietary indiscretion.  -Monitor BMP  -Continue HD on TTS schedule   Hypoglycemia - Resolved. Pt presented with glucose of 39 on admission and received 1 amp D50 with improvement to 67. Pt with no history of DM. Etiology most likely due to poor PO intake in teh setting of his pSBO and inability to afford food (receives next payment on 2/23).  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 Due to concomitant hyperkalemia and hypoglycemia adrenal insufficieny possible however pt not hypotensive or hyponatremic. Phos elevated to 10.6, pt on Phoslo. -Continue phoslo once able to tolerate diet  -Continue weekly hectoral with HD   ESRD - Pt on TTS schedule at [facility]. Pt reports attending session on 6/71 without complication. Cr of 6.06 on admission. Hyperkalemia on admission without EKG changes and no pulmonary edema seen on abdomen imaging to warrant emergent HD. Nephrology dialyzed yesterday and pt does not appear volume overloaded.   -Continue renavite and phoslo once able to tolerate diet   Recent HCAP - Pt was recently hospitalized and treated for HCAP from 2/11-2/15 and was to take Levaquin 250 mg Q 48 hr for 14 days however not able to afford medication. Pt afebrile on admission with mild leukocytosis. Cough and dyspnea at baseline. Pt with rales at right base and diffuse wheezing on exam today. Continuing abx.   -Avoid Levaquin in setting of prolonged QT  -Augmentin 250-$RemoveBeforeD'125mg'iVostTigXUzDEf$  daily after HD (pt with h/o of leaving AMA)  -Albuterol  nebs for wheezing  Prolonged QT - Pt with QTc of 643 on admission, worse from prior 466 on 2/11. Pt received levaquin on previous admission    -Avoiding QT prolonging medications (zofran, phenergan, levaquin, etc)   Hypertension - Pt hypertensive on admission.  -Resume home metoprolol 50 mg daily  -Resume home amlodipine 10 mg daily   Paroxysmal Atrial Fibrillation - Stable. Pt rate and rhythm controlled. Pt on AP therapy. CHADS score of 1 (HTN).  -Cont home daily 81 mg aspirin  -Cont home metoprolol 50 mg daily  -D/c telemetry   Chronic Thrombocytopenia - Pt with platelet count of 170K on admission, above baseline of 100K.  -Monitor for bleeding  -SQ heparin for DVT prophylaxis   Chronic Microcytic Anemia - Stable. Pt with Hg of 12.9 on admission, above baseline 10-11. Pt hemodynamically stable without active bleeding. Etiology most likely due to CKD. Pt receives ESA and erythropoetin weekly at HD.   COPD - Stable. Currently w/o acute exacerbation. Pt not on home oxygen. Previously on daily inhalers.  -Albuterol inhaler PRN bronchospasm  -Consider resuming daily inhalers (records were previously obtained from [place] in [place])  PTSD - currently with stable mood.  -Continue home prazosin 4 mg daily for nightmares  -Continue home trazadone 50 mg daily at bedtime   Social Issues - Pt reports not being able to afford his medications and consequently on none of his home medications. He reports he will [facility] on 2/23.   DVT PPx: [place] heparin    Dispo: D/c to home once tolerating regular diet.    The patient does not have a current PCP (No Pcp Per Patient) and Will be [facility] with [facility] to [facility] care as his PCP need an [facility] hospital follow-up appointment after discharge.  The patient does not have transportation limitations that hinder transportation to clinic appointments.  .Services Needed at time of discharge: Y = Yes, Blank = No PT:   OT:   RN:   Equipment:   Other:     LOS: 2 days   Otho Bellows, MD 01/01/2014, 11:33 AM

## 2014-01-01 NOTE — Progress Notes (Signed)
[facility] Progress Note  Subjective:   No c/o's.   Objective Filed Vitals:   12/31/13 2128 01/01/14 0441 01/01/14 0500 01/01/14 0929  BP: 122/81 110/78  113/74  Pulse: 78 70  81  Temp: 97.4 F (36.3 C) 97.2 F (36.2 C)  97.5 F (36.4 C)  TempSrc: Axillary Axillary  Oral  Resp: 18 16  16   Weight: 52.98 kg (116 lb 12.8 oz)  52.305 kg (115 lb 5 oz)   SpO2: 98% 95%  94%   Physical Exam General: Minimally cooperative, NAD Heart: RRR, no murmur or rub Lungs: Coarse breath sounds, no appreciable wheezes/ rhonchi Abdomen: Distended, NT, + BS Extremities: No LE edema Dialysis Access: R AVG + bruit  Dialysis Orders: Center: [facility] on TTS.  EDW 55.5 kg HD Bath 2K/2.25Ca Time 4:00 Heparin 3000. Access R AVG BFR 400 DFR 800  Hectorol 2 mcg IV/HD Epogen 3800 Units IV/HD Venofer 100 mg IV/HD thru 2/24  Recent labs - Hgb 11, Tsat 22%, Phos 10.1, PTH 507 in Jan, Alb 3.3 in Jan  Assessment/Plan: 1. Hyperkalemia - Resolved with HD 2. Constipation/ sbo vs ileus - Mgmt per primary. Moving bowels this admit 3. Recent HCAP - mgmt per primary. Was unable to afford Levaquin at discharge. On Augmentin here 4. ESRD - TTS, Next HD 2/24 5. Hertension/volume - SBPs 110s-120s on Norvasc 10 and metoprolol 25 BID (lowered this admit). +ascites. Under edw.  6. Metabolic bone disease - Ca 9 (92.4 corrected). Low Ca bath. Continue holding hectorol 1 and Phoslo 2 ac for hypercalcemia. Phos poorly controlled here and op.Try fosrenol once eating given GI issues.   7. Anemia - Hgb 11.5 consistent with op labs. Last Tsat 22%. IV Fe through 2/24 per op schedule 8. Malnutrition - Alb 2.8. Full liquids, Multivitamin, prostat BID 9. DM - Mgmt per primary. Hypoglycemia this admit likely due to poor PO intake. 10. Cirrhosis /Chronic ascites  - requiring frequent paracentesis, most recently on 2/9. 11. Medical non-compliance - due in part to # 10 below. Rarely runs full HD treatment 12. Difficult social  situation - Disabled veteran who recently relocated from [place]. Estranged from family. Homeless with hx of substance abuse. Resides in extended stay hotel, no money for food at times. Multiple medical problems that have gone unmanaged as a result. Has appt on 3/6 at [facility] to hopefully establish care.   Scot Jun. Thad Ranger [facility] Pager [phone] 01/01/2014,12:32 PM  LOS: 2 days   Renal Attending: Tolerating clear liquids and diet advancing. Hx and eval as above.  It appears social issues are prominent. A.C> Lowell Guitar, MD  Additional Objective Labs: Basic Metabolic Panel:  Recent Labs Lab 12/30/13 2225 12/31/13 0350 01/01/14 0835  NA 136* 138 140  K 5.8* 5.1 3.7  CL 82* 85* 96  CO2 19 24 26   GLUCOSE 39* 197* 84  BUN 49* 62* 35*  CREATININE 6.06* 6.66* 4.51*  CALCIUM 10.7* 9.8 9.0  PHOS  --  10.6*  --    Liver Function Tests:  Recent Labs Lab 12/30/13 2225 12/31/13 0052 12/31/13 0350 01/01/14 0835  AST 347*  --  280* 134*  ALT 118*  --  103* 67*  ALKPHOS 256*  --  201* 163*  BILITOT 4.6* 3.7* 2.9* 1.7*  PROT 9.0*  --  7.4 6.3  ALBUMIN 3.5  --  2.8*  2.8* 2.3*    Recent Labs Lab 12/30/13 2225  LIPASE 37   CBC:  Recent Labs Lab  12/30/13 2050 12/31/13 0350  WBC 10.8* 9.5  NEUTROABS 8.2*  --   HGB 12.9* 11.5*  HCT 38.3* 34.2*  MCV 80.6 79.5  PLT 170 151   Blood Culture    Component Value Date/Time   SDES BLOOD LEFT ARM 12/22/2013 1811   SPECREQUEST BOTTLES DRAWN AEROBIC AND ANAEROBIC RED [facility]2CC BLUE 10CC 12/22/2013 1811   CULT  Value: NO GROWTH 5 DAYS [facility] 12/22/2013 1811   REPTSTATUS 12/29/2013 FINAL 12/22/2013 1811    CBG:  Recent Labs Lab 12/30/13 2321 12/30/13 2343 12/31/13 0045 12/31/13 0803 01/01/14 0802  GLUCAP 55* 66* 67* 151* 100*   Studies/Results: Dg Abd Acute W/chest  12/30/2013   CLINICAL DATA:  One day history of nausea and vomiting. Constipation.  EXAM: ACUTE ABDOMEN SERIES  (ABDOMEN 2 VIEW & CHEST 1 VIEW) 12/30/2013 2049 hr:  COMPARISON:  DG ABDOMEN 1V dated 12/30/2013 0349 hr; DG CHEST 2 VIEW dated 12/22/2013; DG CHEST 2 VIEW dated 12/21/2013; DG CHEST 1V PORT dated 12/19/2013; DG ABD ACUTE W/CHEST dated 11/20/2013  FINDINGS: Mild gaseous distention of a solitary loop of small bowel in the left mid abdomen, with improvement in the bowel gas pattern since the examination earlier today. Gas in normal caliber colon. Scattered colonic and small bowel air-fluid levels on the decubitus image. No free intraperitoneal air. Phleboliths low in both sides of the pelvis.  Stable marked cardiomegaly. Pulmonary venous hypertension without overt edema. Scarring at the right lung base at the site of the prior pneumonia. Lungs otherwise clear. No localized airspace consolidation. No pleural effusions. No pneumothorax.  IMPRESSION: 1. Improving partial small bowel obstruction or ileus, with fewer distended loops of small bowel in the abdomen when compared to the examination earlier same date. No free intraperitoneal air. 2. Stable marked cardiomegaly. Pulmonary venous hypertension without overt edema. Scarring at the right lung base. No acute cardiopulmonary disease.   Electronically Signed   By: Hulan Saashomas  Lawrence M.D.   On: 12/30/2013 21:13   Medications:   . amLODipine  10 mg Oral Daily  . amoxicillin-clavulanate  1 tablet Oral Daily  . aspirin EC  81 mg Oral Daily  . calcium acetate  1,334 mg Oral TID WC  . feeding supplement (PRO-STAT SUGAR FREE 64)  30 mL Oral BID  . feeding supplement (RESOURCE BREEZE)  1 Container Oral TID WC  . ferric gluconate (FERRLECIT/NULECIT) IV  125 mg Intravenous Q T,Th,Sa-HD  . folic acid  1 mg Oral Daily  . heparin  5,000 Units Subcutaneous 3 times per day  . lactulose  10 g Oral TID  . metoprolol  25 mg Oral BID  . multivitamin  1 tablet Oral QHS  . prazosin  4 mg Oral QHS  . sodium chloride  3 mL Intravenous Q12H  . traZODone  50 mg Oral QHS

## 2014-01-02 DIAGNOSIS — K56609 Unspecified intestinal obstruction, unspecified as to partial versus complete obstruction: Principal | ICD-10-CM

## 2014-01-02 DIAGNOSIS — R188 Other ascites: Secondary | ICD-10-CM

## 2014-01-02 DIAGNOSIS — B182 Chronic viral hepatitis C: Secondary | ICD-10-CM

## 2014-01-02 DIAGNOSIS — K7460 Unspecified cirrhosis of liver: Secondary | ICD-10-CM

## 2014-01-02 DIAGNOSIS — Z992 Dependence on renal dialysis: Secondary | ICD-10-CM

## 2014-01-02 DIAGNOSIS — F4310 Post-traumatic stress disorder, unspecified: Secondary | ICD-10-CM

## 2014-01-02 DIAGNOSIS — E875 Hyperkalemia: Secondary | ICD-10-CM

## 2014-01-02 DIAGNOSIS — N186 End stage renal disease: Secondary | ICD-10-CM

## 2014-01-02 LAB — GLUCOSE, CAPILLARY: Glucose-Capillary: 112 mg/dL — ABNORMAL HIGH (ref 70–99)

## 2014-01-02 LAB — CBC
HCT: 31.2 % — ABNORMAL LOW (ref 39.0–52.0)
HEMOGLOBIN: 10.9 g/dL — AB (ref 13.0–17.0)
MCH: 26.9 pg (ref 26.0–34.0)
MCHC: 34.9 g/dL (ref 30.0–36.0)
MCV: 77 fL — AB (ref 78.0–100.0)
Platelets: 101 10*3/uL — ABNORMAL LOW (ref 150–400)
RBC: 4.05 MIL/uL — ABNORMAL LOW (ref 4.22–5.81)
RDW: 21.2 % — ABNORMAL HIGH (ref 11.5–15.5)
WBC: 7 10*3/uL (ref 4.0–10.5)

## 2014-01-02 LAB — RENAL FUNCTION PANEL
Albumin: 2.3 g/dL — ABNORMAL LOW (ref 3.5–5.2)
BUN: 50 mg/dL — ABNORMAL HIGH (ref 6–23)
CO2: 25 meq/L (ref 19–32)
CREATININE: 5.67 mg/dL — AB (ref 0.50–1.35)
Calcium: 9 mg/dL (ref 8.4–10.5)
Chloride: 97 mEq/L (ref 96–112)
GFR calc Af Amer: 11 mL/min — ABNORMAL LOW (ref >90)
GFR calc non Af Amer: 10 mL/min — ABNORMAL LOW (ref >90)
Glucose, Bld: 124 mg/dL — ABNORMAL HIGH (ref 70–99)
Phosphorus: 7 mg/dL — ABNORMAL HIGH (ref 2.3–4.6)
Potassium: 3.5 mEq/L — ABNORMAL LOW (ref 3.7–5.3)
Sodium: 139 mEq/L (ref 137–147)

## 2014-01-02 LAB — CBG MONITORING, ED: GLUCOSE-CAPILLARY: 186 mg/dL — AB (ref 70–99)

## 2014-01-02 MED ORDER — AMOXICILLIN-POT CLAVULANATE 250-125 MG PO TABS: 1.0000 | ORAL_TABLET | Freq: Every day | ORAL | Status: DC

## 2014-01-02 NOTE — Progress Notes (Signed)
Subjective: Patient seen at bedside this AM, no new complaints. Says his appetite is back to normal, passing flatus, having bowel movements. No fever, chills, nausea, or vomiting. Still with mild intermittent cough, some coarse breath sounds on exam, unchanged from before.   Objective: Vital signs in last 24 hours: Filed Vitals:   01/01/14 2039 01/02/14 0458 01/02/14 0500 01/02/14 0801  BP: 136/70 120/79  139/84  Pulse: 77 66  75  Temp: 97.3 F (36.3 C) 97.8 F (36.6 C)  97.5 F (36.4 C)  TempSrc: Oral Oral  Oral  Resp: 20 18  19   Weight: 120 lb 9.6 oz (54.704 kg)  118 lb (53.524 kg)   SpO2: 100% 100%  95%   Weight change: 3.7 oz (0.104 kg)  Intake/Output Summary (Last 24 hours) at 01/02/14 0852 Last data filed at 01/02/14 0826  Gross per 24 hour  Intake    540 ml  Output      0 ml  Net    540 ml   Vitals reviewed. General: Cachetic male. Resting in bed, NAD HEENT: PERRL, EOMI, no scleral icterus Cardiac: RRR, no rubs, murmurs or gallops Pulm: Air entry equal bilaterally, no wheezing, mildly coarse breath sounds bilaterally.  Abd: soft, nontender. +Distension, BS present Ext: warm and well perfused, no peripheral edema Neuro: alert and oriented X3, cranial nerves II-XII grossly intact, nonfocal  Lab Results: Basic Metabolic Panel:  Recent Labs Lab 12/31/13 0350 01/01/14 0835 01/02/14 0650  NA 138 140 139  K 5.1 3.7 3.5*  CL 85* 96 97  CO2 24 26 25   GLUCOSE 197* 84 124*  BUN 62* 35* 50*  CREATININE 6.66* 4.51* 5.67*  CALCIUM 9.8 9.0 9.0  MG 2.7*  --   --   PHOS 10.6*  --  7.0*   Liver Function Tests:  Recent Labs Lab 12/31/13 0350 01/01/14 0835 01/02/14 0650  AST 280* 134*  --   ALT 103* 67*  --   ALKPHOS 201* 163*  --   BILITOT 2.9* 1.7*  --   PROT 7.4 6.3  --   ALBUMIN 2.8*  2.8* 2.3* 2.3*    Recent Labs Lab 12/30/13 2225  LIPASE 37   CBC:  Recent Labs Lab 12/30/13 2050 12/31/13 0350 01/02/14 0650  WBC 10.8* 9.5 7.0  NEUTROABS  8.2*  --   --   HGB 12.9* 11.5* 10.9*  HCT 38.3* 34.2* 31.2*  MCV 80.6 79.5 77.0*  PLT 170 151 101*   CBG:  Recent Labs Lab 12/30/13 2321 12/30/13 2343 12/31/13 0045 12/31/13 0803 01/01/14 0802 01/02/14 0800  GLUCAP 55* 66* 67* 151* 100* 112*   Coagulation:  Recent Labs Lab 12/31/13 0350  LABPROT 21.4*  INR 1.92*    Alcohol Level:  Recent Labs Lab 12/31/13 0052  ETH <11   Micro Results: Recent Results (from the past 240 hour(s))  MRSA PCR SCREENING     Status: None   Collection Time    12/31/13  3:03 AM      Result Value Ref Range Status   MRSA by PCR NEGATIVE  NEGATIVE Final   Comment:            The GeneXpert MRSA Assay (FDA     approved for NASAL specimens     only), is one component of a     comprehensive MRSA colonization     surveillance program. It is not     intended to diagnose MRSA     infection nor to guide  or     monitor treatment for     MRSA infections.   Medications: I have reviewed the patient's current medications. Scheduled Meds: . amLODipine  10 mg Oral Daily  . amoxicillin-clavulanate  1 tablet Oral Daily  . aspirin EC  81 mg Oral Daily  . calcium acetate  1,334 mg Oral TID WC  . feeding supplement (PRO-STAT SUGAR FREE 64)  30 mL Oral BID  . feeding supplement (RESOURCE BREEZE)  1 Container Oral TID WC  . ferric gluconate (FERRLECIT/NULECIT) IV  125 mg Intravenous Q T,Th,Sa-HD  . folic acid  1 mg Oral Daily  . heparin  5,000 Units Subcutaneous 3 times per day  . lactulose  10 g Oral TID  . metoprolol  25 mg Oral BID  . multivitamin  1 tablet Oral QHS  . prazosin  4 mg Oral QHS  . sodium chloride  3 mL Intravenous Q12H  . traZODone  50 mg Oral QHS   Continuous Infusions:  PRN Meds:.albuterol, dextrose, hydrOXYzine  Assessment/Plan: 58 year old male with ESRD on HD, chronic liver cirrhosis who presents with constipation, emesis, abdominal pain and found to have hyperkalemia, worsening transaminits, with improving pSBO.    Constipation in setting of pSBO- Resolved at this time. Patient passing flatus and normal bowel movements. Appetite is at baseline, able to eat breakfast without any issues.  -Cont home lactulose 10g TID  -Patient stable for discharge.  Recent HCAP - Pt was recently hospitalized and treated for HCAP from 2/11-2/15 and was to take Levaquin 250 mg Q 48 hr for 14 days however not able to afford medication. Pt afebrile on admission with mild leukocytosis. Cough and dyspnea at baseline. Pulmonary exam w/ some coarse breath sounds, however, improved since previous.  -Avoid Levaquin in setting of prolonged QT  -Continue Augmentin 250-125mg  daily after HD for 6 more days (end date 2/29/15).  Anion Gap Metabolic Acidosis- Resolving. Likely baseline AG d/t ESRD. Pt with AG of 35 on admission with low-normal bicarbonate 19. AG 17 this AM, HCO3 25. Lactic acid 2.9 yesterday. -Monitor BMP on outpatient basis.  Transaminitis- Improving. Pt with history of chronic hepatitis C infection. Etiology most likely due to decompensating liver cirrhosis. No fever, encephalopathy, significant leukocytosis, or worsening ascites to suggest SBP. Pt received paracentesis on 2/9 with 1L of clear fluid due to worsening abdominal distension. Will continue to monitor for now. -No further workup at this time.  Hyperkalemia - Resolved. Pt presented with K of 5.8 on admission without EKG changes. Etiology most likely due to ESRD and possible dietary indiscretion.  -Monitor BMP  -Continue HD on TTS schedule   Hypoglycemia - Resolved. Etiology most likely due to poor PO intake in teh setting of his pSBO and inability to afford food (receives next payment on 2/23).  -Monitor CBG   Hypercalcemia in setting of Secondary Hyperparathyroidism - Pt with corrected calcium 11.1 on admission. Etiology most likely due to secondary hyperparathyroidism from ESRD. Phos elevated to 7.0 today, pt on Phoslo. -Continue phoslo -Continue weekly  hectoral with HD   ESRD - Pt on TTS schedule at [facility]. -Continue renavite and phoslo   Prolonged QT - Pt with QTc of 643 on admission, worse from prior 466 on 2/11. Pt received levaquin on previous admission  -Avoiding QT prolonging medications (zofran, phenergan, levaquin, etc)   Hypertension - Normotensive today. -Continue home metoprolol 50 mg qd + amlodipine 10 mg qd  Paroxysmal Atrial Fibrillation - Stable. Pt rate and rhythm controlled. CHADS  score of 1 (HTN).  -Cont home daily 81 mg aspirin  -Cont home metoprolol 50 mg daily   Chronic Thrombocytopenia - At baseline of 100K.  -Monitor for bleeding  -SQ heparin for DVT prophylaxis   Chronic Microcytic Anemia - Stable. At baseline 10-11. Pt hemodynamically stable without active bleeding. Etiology most likely due to ESRD.   COPD - Stable. Currently w/o acute exacerbation.  -Albuterol inhaler PRN bronchospasm  -Consider resuming daily inhalers ([facility] [place]  PTSD - currently with stable mood.  -Continue home prazosin 4 mg daily for nightmares  -Continue home trazadone 50 mg daily at bedtime   Social Issues - Pt reports not being able to afford his medications and consequently on none of his home medications. He reports he will receive his monthly [facility] check on 2/23.   DVT PPx: [place] heparin   Dispo: Discharge today   The patient does not have a current PCP (No Pcp Per Patient) and Will be following up with the [facility] to [facility] care as [facility].  The patient does not have transportation limitations that hinder transportation to clinic appointments.  .Services Needed at time of discharge: Y = Yes, Blank = No PT:   OT:   RN:   Equipment:   Other:     LOS: 3 days   Courtney ParisEden W Tamarcus Condie, MD 01/02/2014, 8:52 AM

## 2014-01-02 NOTE — Progress Notes (Signed)
Internal Medicine Attending  Date: 01/02/2014  Patient name: Carl Benson Medical record number: [id number] Date of birth: 11-May-1956 Age: 58 y.o. Gender: male  I saw and evaluated the patient. I reviewed the resident's note by Dr. Yetta Barre and I agree with the resident's findings and plans as documented in his progress note.  Carl Benson was tolerating a full diet and passing gas when seen on rounds this AM.  Feels markedly improved.  Partial small bowel obstruction has resolved.  Agree with discharge home today.

## 2014-01-02 NOTE — Progress Notes (Signed)
Patient was discharged home (lives in a motel) by cab. Patient was given cab voucher. Patient was given discharge instructions and prescriptions. Patient was educated on small bowl obstruction and when to contact the doctor. Patient was discharged with all belongings. Patient was stable upon discharge.
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
## 2014-01-02 NOTE — Discharge Instructions (Signed)
1. Please schedule a follow up [facility] for [facility]  2. Please take all medications as prescribed. Please take Augmentin 250-125 mg daily for another 6 days.  3. If you have worsening of your symptoms or new symptoms arise, please call the clinic ([phone]), or go to the ER immediately if symptoms are severe.  You have done a great job in taking all your medications. I appreciate it very much. Please continue doing that.  Small Bowel Obstruction A small bowel obstruction is a blockage (obstruction) of the small intestine (small bowel). The small bowel is a long, slender tube that connects the stomach to the colon. Its job is to absorb nutrients from the fluids and foods you consume into the bloodstream.  CAUSES  There are many causes of intestinal blockage. The most common ones include:  Hernias. This is a more common cause in children than adults.  Inflammatory bowel disease (enteritis and colitis).  Twisting of the bowel (volvulus).  Tumors.  Scar tissue (adhesions) from previous surgery or radiation treatment.  Recent surgery. This may cause an acute small bowel obstruction called an ileus. SYMPTOMS   Abdominal pain. This may be dull cramps or sharp pain. It may occur in one area or may be present in the entire abdomen. Pain can range from mild to severe, depending on the degree of obstruction.  Nausea and vomiting. Vomit may be greenish or yellow bile color.  Distended or swollen stomach. Abdominal bloating is a common symptom.  Constipation.  Lack of passing gas.  Frequent belching.  Diarrhea. This may occur if runny stool is able to leak around the obstruction. DIAGNOSIS  Your caregiver can usually diagnose small bowel obstruction by taking a history, doing a physical exam, and taking X-rays. If the cause is unclear, a CT scan (computerized tomography) of your abdomen and pelvis may be needed. TREATMENT  Treatment of the blockage depends on  the cause and how bad the problem is.   Sometimes, the obstruction improves with bed rest and intravenous (IV) fluids.  Resting the bowel is very important. This means following a simple diet. Sometimes, a clear liquid diet may be required for several days.  Sometimes, a small tube (nasogastric tube) is placed into the stomach to decompress the bowel. When the bowel is blocked, it usually swells up like a balloon filled with air and fluids. Decompression means that the air and fluids are removed by suction through that tube. This can help with pain, discomfort, and nausea. It can also help the obstruction resolve faster.  Surgery may be required if other treatments do not work. Bowel obstruction from a hernia may require early surgery and can be an emergency procedure. Adhesions that cause frequent or severe obstructions may also require surgery. HOME CARE INSTRUCTIONS If your bowel obstruction is only partial or incomplete, you may be allowed to go home.  Get plenty of rest.  Follow your diet as directed by your caregiver.  Only consume clear liquids until your condition improves.  Avoid solid foods as instructed. SEEK IMMEDIATE MEDICAL CARE IF:  You have increased pain or cramping.  You vomit blood.  You have uncontrolled vomiting or nausea.  You cannot drink fluids due to vomiting or pain.  You develop confusion.  You begin feeling very dry or thirsty (dehydrated).  You have severe bloating.  You have chills.  You have a fever.  You feel extremely weak or you faint. MAKE SURE YOU:  Understand these instructions.  Will watch your condition.  Will get help right away if you are not doing well or get worse. Document Released: 01/13/2006 Document Revised: 01/19/2012 Document Reviewed: 01/10/2011 [facility] Patient Information 2014 [place], [place].

## 2014-01-02 NOTE — Progress Notes (Signed)
   CARE MANAGEMENT NOTE 01/02/2014  Patient:  Carl Benson, Carl Benson   Account Number:  [id number]  Date Initiated:  01/02/2014  Documentation initiated by:  Lizabeth Leyden  Subjective/Objective Assessment:   admitted with abdominal pain, vomiting  medical hx: ESRD/HD on TTS, COPD,  cirrhosis     Action/Plan:   medication assistance   Anticipated DC Date:  01/02/2014   Anticipated DC Plan:  [place]  CM consult      Choice offered to / List presented to:             Status of service:  Completed, signed off Medicare Important Message given?   (If response is "NO", the following Medicare IM given date fields will be blank) Date Medicare IM given:   Date Additional Medicare IM given:    Discharge Disposition:  HOME/SELF CARE  Per UR Regulation:    If discussed at Long Length of Stay Meetings, dates discussed:    Comments:  01/02/2014  [place], [place] [phone] CM referral: medication costs  Met with patient regarding medication assistance. He has Medicare A&B and VA benefits. He recently moved to [place] from [place].  [place] clinic call center [phone] spoke with Eddie. The patient is scheduled on March 6th @ 9:00 am  Spoke with Dr Ronnald Ramp regarding the patient is not eligible for medication assistance.  He will discharge the patient on a medication on the $4 list at [facility].

## 2014-01-03 LAB — DRUG SCREEN PANEL (SERUM)

## 2014-01-05 ENCOUNTER — Emergency Department (HOSPITAL_COMMUNITY): Payer: Medicare Other

## 2014-01-05 ENCOUNTER — Observation Stay (HOSPITAL_COMMUNITY)
Admission: EM | Admit: 2014-01-05 | Discharge: 2014-01-06 | Disposition: A | Discharge status: Home / Self Care | Payer: Medicare Other | Attending: Internal Medicine | Admitting: Internal Medicine

## 2014-01-05 ENCOUNTER — Encounter (HOSPITAL_COMMUNITY): Payer: Self-pay | Admitting: Emergency Medicine

## 2014-01-05 DIAGNOSIS — K7460 Unspecified cirrhosis of liver: Secondary | ICD-10-CM | POA: Insufficient documentation

## 2014-01-05 DIAGNOSIS — Z992 Dependence on renal dialysis: Secondary | ICD-10-CM | POA: Insufficient documentation

## 2014-01-05 DIAGNOSIS — E8779 Other fluid overload: Principal | ICD-10-CM | POA: Insufficient documentation

## 2014-01-05 DIAGNOSIS — E8770 Fluid overload, unspecified: Secondary | ICD-10-CM | POA: Diagnosis present

## 2014-01-05 DIAGNOSIS — N186 End stage renal disease: Secondary | ICD-10-CM | POA: Insufficient documentation

## 2014-01-05 DIAGNOSIS — E872 Acidosis, unspecified: Secondary | ICD-10-CM | POA: Insufficient documentation

## 2014-01-05 DIAGNOSIS — IMO0002 Reserved for concepts with insufficient information to code with codable children: Secondary | ICD-10-CM | POA: Insufficient documentation

## 2014-01-05 DIAGNOSIS — R9431 Abnormal electrocardiogram [ECG] [EKG]: Secondary | ICD-10-CM | POA: Insufficient documentation

## 2014-01-05 DIAGNOSIS — F411 Generalized anxiety disorder: Secondary | ICD-10-CM | POA: Insufficient documentation

## 2014-01-05 DIAGNOSIS — I509 Heart failure, unspecified: Secondary | ICD-10-CM | POA: Insufficient documentation

## 2014-01-05 DIAGNOSIS — D509 Iron deficiency anemia, unspecified: Secondary | ICD-10-CM | POA: Insufficient documentation

## 2014-01-05 DIAGNOSIS — I10 Essential (primary) hypertension: Secondary | ICD-10-CM | POA: Diagnosis present

## 2014-01-05 DIAGNOSIS — I120 Hypertensive chronic kidney disease with stage 5 chronic kidney disease or end stage renal disease: Secondary | ICD-10-CM | POA: Insufficient documentation

## 2014-01-05 DIAGNOSIS — J4489 Other specified chronic obstructive pulmonary disease: Secondary | ICD-10-CM | POA: Insufficient documentation

## 2014-01-05 DIAGNOSIS — I252 Old myocardial infarction: Secondary | ICD-10-CM | POA: Insufficient documentation

## 2014-01-05 DIAGNOSIS — I4891 Unspecified atrial fibrillation: Secondary | ICD-10-CM | POA: Insufficient documentation

## 2014-01-05 DIAGNOSIS — Z7982 Long term (current) use of aspirin: Secondary | ICD-10-CM | POA: Insufficient documentation

## 2014-01-05 DIAGNOSIS — Z79899 Other long term (current) drug therapy: Secondary | ICD-10-CM | POA: Insufficient documentation

## 2014-01-05 DIAGNOSIS — E871 Hypo-osmolality and hyponatremia: Secondary | ICD-10-CM | POA: Insufficient documentation

## 2014-01-05 DIAGNOSIS — F4310 Post-traumatic stress disorder, unspecified: Secondary | ICD-10-CM | POA: Insufficient documentation

## 2014-01-05 DIAGNOSIS — N2581 Secondary hyperparathyroidism of renal origin: Secondary | ICD-10-CM | POA: Insufficient documentation

## 2014-01-05 DIAGNOSIS — F17200 Nicotine dependence, unspecified, uncomplicated: Secondary | ICD-10-CM | POA: Insufficient documentation

## 2014-01-05 DIAGNOSIS — R0600 Dyspnea, unspecified: Secondary | ICD-10-CM | POA: Diagnosis present

## 2014-01-05 DIAGNOSIS — F332 Major depressive disorder, recurrent severe without psychotic features: Secondary | ICD-10-CM | POA: Insufficient documentation

## 2014-01-05 DIAGNOSIS — D696 Thrombocytopenia, unspecified: Secondary | ICD-10-CM | POA: Insufficient documentation

## 2014-01-05 DIAGNOSIS — I2510 Atherosclerotic heart disease of native coronary artery without angina pectoris: Secondary | ICD-10-CM | POA: Insufficient documentation

## 2014-01-05 DIAGNOSIS — E43 Unspecified severe protein-calorie malnutrition: Secondary | ICD-10-CM | POA: Insufficient documentation

## 2014-01-05 DIAGNOSIS — B182 Chronic viral hepatitis C: Secondary | ICD-10-CM | POA: Insufficient documentation

## 2014-01-05 DIAGNOSIS — R0602 Shortness of breath: Secondary | ICD-10-CM | POA: Insufficient documentation

## 2014-01-05 DIAGNOSIS — J449 Chronic obstructive pulmonary disease, unspecified: Secondary | ICD-10-CM | POA: Diagnosis present

## 2014-01-05 LAB — I-STAT CG4 LACTIC ACID, ED: LACTIC ACID, VENOUS: 2.28 mmol/L — AB (ref 0.5–2.2)

## 2014-01-05 MED ORDER — VANCOMYCIN HCL IN DEXTROSE 1-5 GM/200ML-% IV SOLN: 1000.0000 mg | Freq: Once | INTRAVENOUS | Status: DC

## 2014-01-05 MED ORDER — VANCOMYCIN HCL 10 G IV SOLR
1250.0000 mg | Freq: Once | INTRAVENOUS | Status: DC
  Filled 2014-01-05: qty 1250

## 2014-01-05 MED ORDER — DEXTROSE 5 % IV SOLN
2.0000 g | Freq: Once | INTRAVENOUS | Status: AC
  Administered 2014-01-06: 2 g via INTRAVENOUS

## 2014-01-05 NOTE — ED Notes (Signed)
Pt from extended stay hotel- recently tx for pneumonia. Pt is dialysis pt had dialysis today for half tx. (shunt in R arm). Pt reports sob. Pt non compliant with medications. Pt having episodes where he will dose off for a few seconds and when he would come back to, he would be stating "Carl Benson isn't here, he went to work". Pt htn 180/114 hr-113 97% on 4L [place]. cbg- 113. Denies pain.

## 2014-01-05 NOTE — ED Provider Notes (Signed)
CSN: [id number]     Arrival date & time 01/05/14  2217 History   First MD Initiated Contact with Patient 01/05/14 2244     Chief Complaint  Patient presents with  . Shortness of Breath     (Consider location/radiation/quality/duration/timing/severity/associated sxs/prior Treatment) HPI Comments: Patient is a 58 year old male past medical history significant for COPD, hypertension, hep C, ESRD on hemodialysis, tobacco user presented to the emergency department for shortness of breath that began around 3 hours ago waking him from sleep. He endorses associated chest tightness and nonproductive cough. Patient states he was to dialysis and only received half a days worth of getting dialyzed. Patient states he has a history of similar symptoms in past when not completing his dialysis. He endorses subjective fevers and chills. He was recently discharged from the hospital on February 16 for history of present illness and on February 23 for a partial bowel obstruction. Patient is historically noncompliant with his medications.  Patient is a 58 y.o. male presenting with shortness of breath.  Shortness of Breath Associated symptoms: cough and fever (subjective)   Associated symptoms: no abdominal pain and no vomiting     Past Medical History  Diagnosis Date  . COPD (chronic obstructive pulmonary disease)   . Hypertension   . Hep C w/ coma, chronic   . Irregular heartbeat   . ESRD (end stage renal disease) on dialysis     /notes 11/11/2013  . Smoker unmotivated to quit   . Active smoker   . PTSD (post-traumatic stress disorder)   . Shortness of breath    Past Surgical History  Procedure Laterality Date  . Paracentesis  ~ 10/2013    [name] 11/11/2013   Family History  Problem Relation Age of Onset  . Heart disease Mother   . Hypertension Mother   . Diabetes Mother    History  Substance Use Topics  . Smoking status: Current Every Day Smoker -- 0.50 packs/day for 15 years    Types:  Cigarettes  . Smokeless tobacco: Never Used  . Alcohol Use: No     Comment: DENIES (clean 6 mo) Becomes agitated when asked about EtOH use    Review of Systems  Constitutional: Positive for fever (subjective) and chills (subjective).  Respiratory: Positive for cough, chest tightness and shortness of breath.   Gastrointestinal: Negative for nausea, vomiting and abdominal pain.  All other systems reviewed and are negative.      Allergies  Review of patient's allergies indicates no known allergies.  Home Medications  No current outpatient prescriptions on file. BP 182/99  Pulse 122  Temp(Src) 98.4 F (36.9 C) (Oral)  Resp 16  SpO2 93% Physical Exam  Constitutional: He is oriented to person, place, and time. He appears well-developed and well-nourished. No distress.  HENT:  Head: Normocephalic and atraumatic.  Right Ear: External ear normal.  Left Ear: External ear normal.  Nose: Nose normal.  Eyes: Conjunctivae are normal.  Neck: Neck supple.  Cardiovascular: Regular rhythm, normal heart sounds and intact distal pulses.  Tachycardia present.   Pulmonary/Chest: Accessory muscle usage present. Tachypnea noted. He is in respiratory distress. He has wheezes.  Abdominal: Soft. There is tenderness.  Musculoskeletal: Normal range of motion. He exhibits no edema.  Neurological: He is alert and oriented to person, place, and time.  Oriented to self, situation, and place, but not time.   Skin: Skin is warm and dry. He is not diaphoretic.  AV fistula w/ palpable thrill.  ED Course  Procedures (including critical care time) Medications  vancomycin (VANCOCIN) 1,250 mg in sodium chloride 0.9 % 250 mL IVPB (not administered)  ceFEPIme (MAXIPIME) 2 g in dextrose 5 % 50 mL IVPB (2 g Intravenous New Bag/Given 01/06/14 0010)    Labs Review Labs Reviewed  CBC WITH DIFFERENTIAL - Abnormal; Notable for the following:    WBC 11.3 (*)    Hemoglobin 12.6 (*)    HCT 35.4 (*)    MCV  76.0 (*)    RDW 23.4 (*)    Platelets 99 (*)    All other components within normal limits  COMPREHENSIVE METABOLIC PANEL - Abnormal; Notable for the following:    Sodium 136 (*)    Chloride 90 (*)    Glucose, Bld 111 (*)    BUN 35 (*)    Creatinine, Ser 4.91 (*)    Total Protein 8.6 (*)    Albumin 3.2 (*)    AST 109 (*)    ALT 64 (*)    Alkaline Phosphatase 300 (*)    Total Bilirubin 2.0 (*)    GFR calc non Af Amer 12 (*)    GFR calc Af Amer 14 (*)    All other components within normal limits  I-STAT CG4 LACTIC ACID, ED - Abnormal; Notable for the following:    Lactic Acid, Venous 2.28 (*)    All other components within normal limits  TROPONIN I  PRO B NATRIURETIC PEPTIDE   Imaging Review Dg Chest Portable 1 View  01/05/2014   CLINICAL DATA:  58 year old male with shortness of breath. History of COPD and end-stage renal disease.  EXAM: PORTABLE CHEST - 1 VIEW  COMPARISON:  12/30/2013 and prior chest radiographs  FINDINGS: Cardiomegaly and interstitial pulmonary edema noted.  There is no evidence of focal airspace disease, suspicious pulmonary nodule/mass, pleural effusion, or pneumothorax. No acute bony abnormalities are identified. Is  IMPRESSION: Cardiomegaly with interstitial pulmonary edema.   Electronically Signed   By: Laveda AbbeJeff  Hu M.D.   On: 01/05/2014 23:29    EKG Interpretation   None       MDM   Final diagnoses:  Shortness of breath  ESRD (end stage renal disease)    Filed Vitals:   01/06/14 0012  BP: 182/99  Pulse: 122  Temp:   Resp: 16    The patient is acute onset shortness of breath became from 3 hours ago. He has associated cough and endorses subjective fevers and chills. Patient is afebrile, tachycardiac, hypertensive, initially hypoxic but responding on 4 L of oxygen. He does show signs of respiratory distress, accessory muscle use and decreased lung sounds. Patient was recently discharged from the hospital for healthcare acquired pneumonia on February  15, patient was also discharged on February 23 for partial bowel obstruction. Concern for possible recurrent pneumonia and/or fluid overload. Started Abx just in case to cover for HCAP. Chest x-ray reveals pulmonary edema. Labs reviewed. Will admit patient to internal medicine service for further evaluation and management of symptoms. Patient d/w with Dr. Dierdre Highmanpitz, agrees with plan.    Jeannetta EllisJennifer L Latrena Benegas, PA-C 01/06/14 [phone]

## 2014-01-05 NOTE — ED Notes (Signed)
Pt ambulated to bathroom 

## 2014-01-05 NOTE — ED Provider Notes (Signed)
Date: 01/05/2014  Rate: 78  Rhythm: normal sinus rhythm  QRS Axis: normal  Intervals: QT prolonged  ST/T Wave abnormalities: nonspecific ST/T changes  Conduction Disutrbances:none  Narrative Interpretation: sinus LVH with NSSTT changes similar to prior 12/30/13  Old EKG Reviewed: unchanged  Medical screening examination/treatment/procedure(s) were conducted as a shared visit with non-physician practitioner(s) and myself.  I personally evaluated the patient during the encounter.   Evaluated for shortness of breath, felt like he did not get his full dialysis and by report only received half of his dialysis session today due to showing up late.  Recent pneumonia. Denies fevers. On exam, be tachycardic, is tachypnea, decreased bilateral breath sounds but no obvious rales and no wheezes.   patient admitted to medicine service  Results for orders placed during the hospital encounter of 01/05/14  TROPONIN I      Result Value Ref Range   Troponin I <0.30  <0.30 ng/mL  CBC WITH DIFFERENTIAL      Result Value Ref Range   WBC 11.3 (*) 4.0 - 10.5 K/uL   RBC 4.66  4.22 - 5.81 MIL/uL   Hemoglobin 12.6 (*) 13.0 - 17.0 g/dL   HCT 07.6 (*) 80.8 - 81.1 %   MCV 76.0 (*) 78.0 - 100.0 fL   MCH 27.0  26.0 - 34.0 pg   MCHC 35.6  30.0 - 36.0 g/dL   RDW 03.1 (*) 59.4 - 58.5 %   Platelets 99 (*) 150 - 400 K/uL   Neutrophils Relative % 67  43 - 77 %   Lymphocytes Relative 23  12 - 46 %   Monocytes Relative 9  3 - 12 %   Eosinophils Relative 1  0 - 5 %   Basophils Relative 0  0 - 1 %   Neutro Abs 7.6  1.7 - 7.7 K/uL   Lymphs Abs 2.6  0.7 - 4.0 K/uL   Monocytes Absolute 1.0  0.1 - 1.0 K/uL   Eosinophils Absolute 0.1  0.0 - 0.7 K/uL   Basophils Absolute 0.0  0.0 - 0.1 K/uL   RBC Morphology ELLIPTOCYTES     WBC Morphology ATYPICAL LYMPHOCYTES     Smear Review LARGE PLATELETS PRESENT    COMPREHENSIVE METABOLIC PANEL      Result Value Ref Range   Sodium 136 (*) 137 - 147 mEq/L   Potassium 3.9  3.7 -  5.3 mEq/L   Chloride 90 (*) 96 - 112 mEq/L   CO2 26  19 - 32 mEq/L   Glucose, Bld 111 (*) 70 - 99 mg/dL   BUN 35 (*) 6 - 23 mg/dL   Creatinine, Ser 9.29 (*) 0.50 - 1.35 mg/dL   Calcium 9.4  8.4 - 24.4 mg/dL   Total Protein 8.6 (*) 6.0 - 8.3 g/dL   Albumin 3.2 (*) 3.5 - 5.2 g/dL   AST 628 (*) 0 - 37 U/L   ALT 64 (*) 0 - 53 U/L   Alkaline Phosphatase 300 (*) 39 - 117 U/L   Total Bilirubin 2.0 (*) 0.3 - 1.2 mg/dL   GFR calc non Af Amer 12 (*) >90 mL/min   GFR calc Af Amer 14 (*) >90 mL/min  PRO B NATRIURETIC PEPTIDE      Result Value Ref Range   Pro B Natriuretic peptide (BNP) >70000.0 (*) 0 - 125 pg/mL  I-STAT CG4 LACTIC ACID, ED      Result Value Ref Range   Lactic Acid, Venous 2.28 (*) 0.5 - 2.2 mmol/L  Sunnie NielsenBrian Escher Harr, MD 01/06/14 [phone]

## 2014-01-06 ENCOUNTER — Encounter (HOSPITAL_COMMUNITY): Payer: Self-pay | Admitting: Emergency Medicine

## 2014-01-06 ENCOUNTER — Emergency Department (HOSPITAL_COMMUNITY): Payer: Medicare Other

## 2014-01-06 ENCOUNTER — Observation Stay (HOSPITAL_BASED_OUTPATIENT_CLINIC_OR_DEPARTMENT_OTHER)
Admission: EM | Admit: 2014-01-06 | Discharge: 2014-01-11 | Discharge status: Against Medical Advice | Payer: Medicare Other | Source: Home / Self Care | Attending: Emergency Medicine | Admitting: Emergency Medicine

## 2014-01-06 DIAGNOSIS — I120 Hypertensive chronic kidney disease with stage 5 chronic kidney disease or end stage renal disease: Secondary | ICD-10-CM | POA: Insufficient documentation

## 2014-01-06 DIAGNOSIS — Z992 Dependence on renal dialysis: Secondary | ICD-10-CM | POA: Insufficient documentation

## 2014-01-06 DIAGNOSIS — D696 Thrombocytopenia, unspecified: Secondary | ICD-10-CM | POA: Insufficient documentation

## 2014-01-06 DIAGNOSIS — Z7982 Long term (current) use of aspirin: Secondary | ICD-10-CM

## 2014-01-06 DIAGNOSIS — I4891 Unspecified atrial fibrillation: Secondary | ICD-10-CM

## 2014-01-06 DIAGNOSIS — I252 Old myocardial infarction: Secondary | ICD-10-CM

## 2014-01-06 DIAGNOSIS — F329 Major depressive disorder, single episode, unspecified: Secondary | ICD-10-CM

## 2014-01-06 DIAGNOSIS — D509 Iron deficiency anemia, unspecified: Secondary | ICD-10-CM

## 2014-01-06 DIAGNOSIS — IMO0002 Reserved for concepts with insufficient information to code with codable children: Secondary | ICD-10-CM | POA: Insufficient documentation

## 2014-01-06 DIAGNOSIS — I10 Essential (primary) hypertension: Secondary | ICD-10-CM

## 2014-01-06 DIAGNOSIS — F411 Generalized anxiety disorder: Secondary | ICD-10-CM

## 2014-01-06 DIAGNOSIS — F4310 Post-traumatic stress disorder, unspecified: Secondary | ICD-10-CM | POA: Insufficient documentation

## 2014-01-06 DIAGNOSIS — I2510 Atherosclerotic heart disease of native coronary artery without angina pectoris: Secondary | ICD-10-CM

## 2014-01-06 DIAGNOSIS — N186 End stage renal disease: Secondary | ICD-10-CM

## 2014-01-06 DIAGNOSIS — K7460 Unspecified cirrhosis of liver: Secondary | ICD-10-CM

## 2014-01-06 DIAGNOSIS — E872 Acidosis, unspecified: Secondary | ICD-10-CM | POA: Insufficient documentation

## 2014-01-06 DIAGNOSIS — F32A Depression, unspecified: Secondary | ICD-10-CM

## 2014-01-06 DIAGNOSIS — J449 Chronic obstructive pulmonary disease, unspecified: Secondary | ICD-10-CM

## 2014-01-06 DIAGNOSIS — B182 Chronic viral hepatitis C: Secondary | ICD-10-CM | POA: Insufficient documentation

## 2014-01-06 DIAGNOSIS — N2581 Secondary hyperparathyroidism of renal origin: Secondary | ICD-10-CM

## 2014-01-06 DIAGNOSIS — E43 Unspecified severe protein-calorie malnutrition: Secondary | ICD-10-CM

## 2014-01-06 DIAGNOSIS — J4489 Other specified chronic obstructive pulmonary disease: Secondary | ICD-10-CM | POA: Insufficient documentation

## 2014-01-06 DIAGNOSIS — F332 Major depressive disorder, recurrent severe without psychotic features: Secondary | ICD-10-CM | POA: Insufficient documentation

## 2014-01-06 DIAGNOSIS — R9431 Abnormal electrocardiogram [ECG] [EKG]: Secondary | ICD-10-CM

## 2014-01-06 DIAGNOSIS — R0600 Dyspnea, unspecified: Secondary | ICD-10-CM | POA: Diagnosis present

## 2014-01-06 DIAGNOSIS — I509 Heart failure, unspecified: Secondary | ICD-10-CM

## 2014-01-06 DIAGNOSIS — Z8659 Personal history of other mental and behavioral disorders: Secondary | ICD-10-CM

## 2014-01-06 DIAGNOSIS — Z8719 Personal history of other diseases of the digestive system: Secondary | ICD-10-CM

## 2014-01-06 DIAGNOSIS — E8779 Other fluid overload: Principal | ICD-10-CM

## 2014-01-06 DIAGNOSIS — E871 Hypo-osmolality and hyponatremia: Secondary | ICD-10-CM | POA: Insufficient documentation

## 2014-01-06 DIAGNOSIS — F17200 Nicotine dependence, unspecified, uncomplicated: Secondary | ICD-10-CM

## 2014-01-06 DIAGNOSIS — E213 Hyperparathyroidism, unspecified: Secondary | ICD-10-CM

## 2014-01-06 LAB — COMPREHENSIVE METABOLIC PANEL
ALBUMIN: 3.2 g/dL — AB (ref 3.5–5.2)
ALT: 64 U/L — ABNORMAL HIGH (ref 0–53)
ALT: 57 U/L — ABNORMAL HIGH (ref 0–53)
AST: 109 U/L — AB (ref 0–37)
AST: 100 U/L — ABNORMAL HIGH (ref 0–37)
Alkaline Phosphatase: 300 U/L — ABNORMAL HIGH (ref 39–117)
Alkaline Phosphatase: 254 U/L — ABNORMAL HIGH (ref 39–117)
BILIRUBIN TOTAL: 2 mg/dL — AB (ref 0.3–1.2)
BUN: 35 mg/dL — ABNORMAL HIGH (ref 6–23)
CALCIUM: 9.4 mg/dL (ref 8.4–10.5)
CO2: 26 mEq/L (ref 19–32)
CO2: 26 mEq/L (ref 19–32)
CREATININE: 4.91 mg/dL — AB (ref 0.50–1.35)
Calcium: 9.2 mg/dL (ref 8.4–10.5)
Chloride: 90 mEq/L — ABNORMAL LOW (ref 96–112)
Creatinine, Ser: 3.31 mg/dL — ABNORMAL HIGH (ref 0.50–1.35)
GFR calc Af Amer: 14 mL/min — ABNORMAL LOW (ref >90)
GFR calc Af Amer: 22 mL/min — ABNORMAL LOW (ref >90)
GFR calc non Af Amer: 12 mL/min — ABNORMAL LOW (ref >90)
GFR calc non Af Amer: 19 mL/min — ABNORMAL LOW (ref >90)
Glucose, Bld: 111 mg/dL — ABNORMAL HIGH (ref 70–99)
Glucose, Bld: 107 mg/dL — ABNORMAL HIGH (ref 70–99)
Potassium: 3.9 mEq/L (ref 3.7–5.3)
Potassium: 5 mEq/L (ref 3.7–5.3)
Sodium: 136 mEq/L — ABNORMAL LOW (ref 137–147)
Sodium: 137 mEq/L (ref 137–147)
TOTAL PROTEIN: 8.6 g/dL — AB (ref 6.0–8.3)
Total Protein: 8.1 g/dL (ref 6.0–8.3)

## 2014-01-06 LAB — CBC WITH DIFFERENTIAL/PLATELET
BASOS PCT: 0 % (ref 0–1)
Basophils Absolute: 0 10*3/uL (ref 0.0–0.1)
EOS ABS: 0.1 10*3/uL (ref 0.0–0.7)
Eosinophils Relative: 1 % (ref 0–5)
HCT: 35.4 % — ABNORMAL LOW (ref 39.0–52.0)
HEMOGLOBIN: 12.6 g/dL — AB (ref 13.0–17.0)
LYMPHS PCT: 23 % (ref 12–46)
Lymphs Abs: 2.6 10*3/uL (ref 0.7–4.0)
MCH: 27 pg (ref 26.0–34.0)
MCHC: 35.6 g/dL (ref 30.0–36.0)
MCV: 76 fL — ABNORMAL LOW (ref 78.0–100.0)
Monocytes Absolute: 1 10*3/uL (ref 0.1–1.0)
Monocytes Relative: 9 % (ref 3–12)
NEUTROS PCT: 67 % (ref 43–77)
Neutro Abs: 7.6 10*3/uL (ref 1.7–7.7)
Platelets: 99 10*3/uL — ABNORMAL LOW (ref 150–400)
RBC: 4.66 MIL/uL (ref 4.22–5.81)
RDW: 23.4 % — ABNORMAL HIGH (ref 11.5–15.5)
WBC: 11.3 10*3/uL — ABNORMAL HIGH (ref 4.0–10.5)

## 2014-01-06 LAB — ACETAMINOPHEN LEVEL: Acetaminophen (Tylenol), Serum: <15 ug/mL (ref 10–30)

## 2014-01-06 LAB — CBC
HCT: 32.3 % — ABNORMAL LOW (ref 39.0–52.0)
Hemoglobin: 11.3 g/dL — ABNORMAL LOW (ref 13.0–17.0)
MCH: 27.3 pg (ref 26.0–34.0)
MCHC: 35 g/dL (ref 30.0–36.0)
MCV: 78 fL (ref 78.0–100.0)
Platelets: 83 10*3/uL — ABNORMAL LOW (ref 150–400)
RBC: 4.14 MIL/uL — ABNORMAL LOW (ref 4.22–5.81)
RDW: 23 % — ABNORMAL HIGH (ref 11.5–15.5)
WBC: 10.2 10*3/uL (ref 4.0–10.5)

## 2014-01-06 LAB — COMPREHENSIVE METABOLIC PANEL WITH GFR
Albumin: 3 g/dL — ABNORMAL LOW (ref 3.5–5.2)
BUN: 23 mg/dL (ref 6–23)
Chloride: 92 meq/L — ABNORMAL LOW (ref 96–112)
Total Bilirubin: 2 mg/dL — ABNORMAL HIGH (ref 0.3–1.2)

## 2014-01-06 LAB — SALICYLATE LEVEL: Salicylate Lvl: <2 mg/dL — ABNORMAL LOW (ref 2.8–20.0)

## 2014-01-06 LAB — ETHANOL: Alcohol, Ethyl (B): <11 mg/dL (ref 0–11)

## 2014-01-06 LAB — RENAL FUNCTION PANEL
Albumin: 2.7 g/dL — ABNORMAL LOW (ref 3.5–5.2)
BUN: 40 mg/dL — ABNORMAL HIGH (ref 6–23)
CHLORIDE: 92 meq/L — AB (ref 96–112)
CO2: 24 meq/L (ref 19–32)
Calcium: 9.1 mg/dL (ref 8.4–10.5)
Creatinine, Ser: 5.44 mg/dL — ABNORMAL HIGH (ref 0.50–1.35)
GFR, EST AFRICAN AMERICAN: 12 mL/min — AB (ref >90)
GFR, EST NON AFRICAN AMERICAN: 10 mL/min — AB (ref >90)
GLUCOSE: 136 mg/dL — AB (ref 70–99)
Phosphorus: 6.2 mg/dL — ABNORMAL HIGH (ref 2.3–4.6)
Potassium: 4 mEq/L (ref 3.7–5.3)
Sodium: 136 mEq/L — ABNORMAL LOW (ref 137–147)

## 2014-01-06 LAB — PRO B NATRIURETIC PEPTIDE
Pro B Natriuretic peptide (BNP): >70000 pg/mL — ABNORMAL HIGH (ref 0–125)
Pro B Natriuretic peptide (BNP): >70000 pg/mL — ABNORMAL HIGH (ref 0–125)

## 2014-01-06 LAB — AMMONIA: Ammonia: 39 umol/L (ref 11–60)

## 2014-01-06 LAB — TROPONIN I

## 2014-01-06 MED ORDER — TIOTROPIUM BROMIDE MONOHYDRATE 18 MCG IN CAPS: 18.0000 ug | ORAL_CAPSULE | Freq: Every day | RESPIRATORY_TRACT | Status: DC

## 2014-01-06 MED ORDER — HEPARIN SODIUM (PORCINE) 1000 UNIT/ML DIALYSIS
1000.0000 [IU] | INTRAMUSCULAR | Status: DC | PRN
  Filled 2014-01-06: qty 1

## 2014-01-06 MED ORDER — PROMETHAZINE HCL 25 MG PO TABS: 12.5000 mg | ORAL_TABLET | Freq: Four times a day (QID) | ORAL | Status: DC | PRN

## 2014-01-06 MED ORDER — BOOST / RESOURCE BREEZE PO LIQD
1.0000 | Freq: Two times a day (BID) | ORAL | Status: DC
  Administered 2014-01-06: 1 via ORAL

## 2014-01-06 MED ORDER — TIOTROPIUM BROMIDE MONOHYDRATE 18 MCG IN CAPS
18.0000 ug | ORAL_CAPSULE | Freq: Every day | RESPIRATORY_TRACT | Status: DC
  Administered 2014-01-06: 18 ug via RESPIRATORY_TRACT
  Filled 2014-01-06: qty 5

## 2014-01-06 MED ORDER — TRAZODONE HCL 50 MG PO TABS
50.0000 mg | ORAL_TABLET | Freq: Every day | ORAL | Status: DC
  Administered 2014-01-06: 50 mg via ORAL
  Filled 2014-01-06 (×2): qty 1

## 2014-01-06 MED ORDER — AMOXICILLIN-POT CLAVULANATE 250-125 MG PO TABS
1.0000 | ORAL_TABLET | Freq: Every day | ORAL | Status: DC
  Administered 2014-01-06: 1 via ORAL
  Filled 2014-01-06: qty 1

## 2014-01-06 MED ORDER — PRAZOSIN HCL 2 MG PO CAPS: 4.0000 mg | ORAL_CAPSULE | Freq: Every day | ORAL | Status: DC

## 2014-01-06 MED ORDER — TRAZODONE HCL 50 MG PO TABS: 50.0000 mg | ORAL_TABLET | Freq: Every day | ORAL | Status: DC

## 2014-01-06 MED ORDER — WHITE PETROLATUM GEL
Status: AC
  Administered 2014-01-06: 0.2
  Filled 2014-01-06: qty 5

## 2014-01-06 MED ORDER — SODIUM CHLORIDE 0.9 % IV SOLN: 100.0000 mL | INTRAVENOUS | Status: DC | PRN

## 2014-01-06 MED ORDER — METOPROLOL TARTRATE 50 MG PO TABS
50.0000 mg | ORAL_TABLET | Freq: Two times a day (BID) | ORAL | Status: DC
  Administered 2014-01-06: 50 mg via ORAL
  Filled 2014-01-06: qty 1
  Filled 2014-01-06: qty 2
  Filled 2014-01-06: qty 1

## 2014-01-06 MED ORDER — ASPIRIN EC 81 MG PO TBEC
81.0000 mg | DELAYED_RELEASE_TABLET | Freq: Every day | ORAL | Status: DC
  Administered 2014-01-06: 81 mg via ORAL
  Filled 2014-01-06: qty 1

## 2014-01-06 MED ORDER — ALBUTEROL SULFATE (2.5 MG/3ML) 0.083% IN NEBU: 2.5000 mg | INHALATION_SOLUTION | RESPIRATORY_TRACT | Status: DC

## 2014-01-06 MED ORDER — RENA-VITE PO TABS
1.0000 | ORAL_TABLET | Freq: Every day | ORAL | Status: DC
  Filled 2014-01-06: qty 1

## 2014-01-06 MED ORDER — LIDOCAINE-PRILOCAINE 2.5-2.5 % EX CREA: 1.0000 "application " | TOPICAL_CREAM | CUTANEOUS | Status: DC | PRN

## 2014-01-06 MED ORDER — FOLIC ACID 1 MG PO TABS: 1.0000 mg | ORAL_TABLET | Freq: Every day | ORAL | Status: DC

## 2014-01-06 MED ORDER — AMLODIPINE BESYLATE 10 MG PO TABS
10.0000 mg | ORAL_TABLET | Freq: Every day | ORAL | Status: DC
  Filled 2014-01-06: qty 1

## 2014-01-06 MED ORDER — HYDROXYZINE HCL 25 MG PO TABS: 25.0000 mg | ORAL_TABLET | Freq: Four times a day (QID) | ORAL | Status: DC | PRN

## 2014-01-06 MED ORDER — HEPARIN SODIUM (PORCINE) 1000 UNIT/ML DIALYSIS
4000.0000 [IU] | Freq: Once | INTRAMUSCULAR | Status: DC
  Filled 2014-01-06: qty 4

## 2014-01-06 MED ORDER — ALBUTEROL SULFATE (2.5 MG/3ML) 0.083% IN NEBU
2.5000 mg | INHALATION_SOLUTION | RESPIRATORY_TRACT | Status: DC
  Administered 2014-01-06: 2.5 mg via RESPIRATORY_TRACT
  Filled 2014-01-06 (×2): qty 3

## 2014-01-06 MED ORDER — METOPROLOL TARTRATE 50 MG PO TABS: 50.0000 mg | ORAL_TABLET | Freq: Two times a day (BID) | ORAL | Status: DC

## 2014-01-06 MED ORDER — LIDOCAINE HCL (PF) 1 % IJ SOLN: 5.0000 mL | INTRAMUSCULAR | Status: DC | PRN

## 2014-01-06 MED ORDER — PENTAFLUOROPROP-TETRAFLUOROETH EX AERO: 1.0000 "application " | INHALATION_SPRAY | CUTANEOUS | Status: DC | PRN

## 2014-01-06 MED ORDER — NEPRO/CARBSTEADY PO LIQD: 237.0000 mL | ORAL | Status: DC | PRN

## 2014-01-06 MED ORDER — GUAIFENESIN ER 600 MG PO TB12
600.0000 mg | ORAL_TABLET | Freq: Two times a day (BID) | ORAL | Status: DC
  Administered 2014-01-06: 600 mg via ORAL
  Filled 2014-01-06 (×2): qty 1

## 2014-01-06 MED ORDER — CALCIUM ACETATE 667 MG PO CAPS: 1334.0000 mg | ORAL_CAPSULE | Freq: Three times a day (TID) | ORAL | Status: DC

## 2014-01-06 MED ORDER — PRAZOSIN HCL 2 MG PO CAPS
4.0000 mg | ORAL_CAPSULE | Freq: Every day | ORAL | Status: DC
  Filled 2014-01-06: qty 2

## 2014-01-06 MED ORDER — HYDROXYZINE HCL 25 MG PO TABS
25.0000 mg | ORAL_TABLET | Freq: Four times a day (QID) | ORAL | Status: DC | PRN
  Administered 2014-01-06: 25 mg via ORAL
  Filled 2014-01-06: qty 1

## 2014-01-06 MED ORDER — FOLIC ACID 1 MG PO TABS
1.0000 mg | ORAL_TABLET | Freq: Every day | ORAL | Status: DC
  Administered 2014-01-06: 1 mg via ORAL
  Filled 2014-01-06: qty 1

## 2014-01-06 MED ORDER — SODIUM CHLORIDE 0.9 % IJ SOLN
3.0000 mL | Freq: Two times a day (BID) | INTRAMUSCULAR | Status: DC
  Administered 2014-01-06: 3 mL via INTRAVENOUS

## 2014-01-06 MED ORDER — LACTULOSE 10 GM/15ML PO SOLN: 10.0000 g | Freq: Three times a day (TID) | ORAL | Status: DC

## 2014-01-06 MED ORDER — LACTULOSE 10 GM/15ML PO SOLN
10.0000 g | Freq: Three times a day (TID) | ORAL | Status: DC
  Administered 2014-01-06 (×2): 10 g via ORAL
  Filled 2014-01-06 (×3): qty 15

## 2014-01-06 MED ORDER — ALTEPLASE 2 MG IJ SOLR
2.0000 mg | Freq: Once | INTRAMUSCULAR | Status: DC | PRN
  Filled 2014-01-06: qty 2

## 2014-01-06 MED ORDER — ASPIRIN 81 MG PO TBEC: 81.0000 mg | DELAYED_RELEASE_TABLET | Freq: Every day | ORAL | Status: DC

## 2014-01-06 MED ORDER — CALCIUM ACETATE 667 MG PO CAPS
1334.0000 mg | ORAL_CAPSULE | Freq: Three times a day (TID) | ORAL | Status: DC
  Administered 2014-01-06: 1334 mg via ORAL
  Filled 2014-01-06 (×4): qty 2

## 2014-01-06 MED ORDER — AMLODIPINE BESYLATE 10 MG PO TABS: 10.0000 mg | ORAL_TABLET | Freq: Every day | ORAL | Status: DC

## 2014-01-06 MED ORDER — HEPARIN SODIUM (PORCINE) 5000 UNIT/ML IJ SOLN
5000.0000 [IU] | Freq: Three times a day (TID) | INTRAMUSCULAR | Status: DC
  Filled 2014-01-06 (×4): qty 1

## 2014-01-06 NOTE — ED Notes (Addendum)
Pt reports PTSD.  States he is out of all medications since January.  C/o depression.  Denies suicidal/homicidal ideation.  States he is just trying to get to [facility] hospital.  Requesting inpatient treatment for PTSD.

## 2014-01-06 NOTE — Progress Notes (Signed)
Utilization review completed. Delance Weide, RN, BSN. 

## 2014-01-06 NOTE — Clinical Social Work Note (Signed)
Patient medically stable for discharge home today. CSW facilitated transport for patient via cab.  Genelle Bal, MSW, LCSW [phone]

## 2014-01-06 NOTE — Discharge Instructions (Signed)
1. Please schedule a follow up appointment for 1-2 weeks at [facility]:  [facility]  [address] [place] [address] [phone]  2. Please take all medications as prescribed. PLEASE GO TO DIALYSIS.  3. If you have worsening of your symptoms or new symptoms arise, please call the clinic [phone]), or go to the ER immediately if symptoms are severe.  You have done a great job in taking all your medications. I appreciate it very much. Please continue doing that.

## 2014-01-06 NOTE — ED Notes (Signed)
Pt ambulated to bathroom. Tolerated well.

## 2014-01-06 NOTE — Progress Notes (Signed)
   CARE MANAGEMENT NOTE 01/06/2014  Patient:  Carl Benson, Carl Benson   Account Number:  [id number]  Date Initiated:  01/06/2014  Documentation initiated by:  Darlyne Russian  Subjective/Objective Assessment:   admitted with dyspnea     Action/Plan:   PCP   Anticipated DC Date:  01/07/2014   Anticipated DC Plan:  HOME/SELF CARE      DC Planning Services  CM consult  [facility]      Choice offered to / List presented to:             Status of service:  Completed, signed off Medicare Important Message given?   (If response is "NO", the following Medicare IM given date fields will be blank) Date Medicare IM given:   Date Additional Medicare IM given:    Discharge Disposition:  HOME/SELF CARE  Per UR Regulation:    If discussed at Long Length of Stay Meetings, dates discussed:    Comments:  01/06/2014  [address] RN, [place]  [phone] CM referral:  PCP  [facility] called for appointment, left message requesting call back.  Patient has appointment at [facility] clinic March 6th @ 9:00 am

## 2014-01-06 NOTE — Progress Notes (Signed)
Hemodialysis- Pt signed off AMA with 1 hour remaining of treatment. Pt states "had enough, I'm going home." Report given to RN on 6E. Dr. Arlean Hopping notified as well.

## 2014-01-06 NOTE — Progress Notes (Signed)
INITIAL NUTRITION ASSESSMENT  DOCUMENTATION CODES Per approved criteria  -Not Applicable   INTERVENTION: Add Resource Breeze po BID, each supplement provides 250 kcal and 9 grams of protein. RD to follow for nutrition care plan.  NUTRITION DIAGNOSIS: Increased nutrient needs related to ESRD on HD as evidenced by estimated nutrition needs.  Goal: Pt to meet >/= 90% of their estimated nutrition needs   Monitor:  PO & supplemental intake, weight, labs, I/O's  Reason for Assessment: Malnutrition Screening Tool Report  58 y.o. 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 Pt admitted with increased SOB, missed 2 hours of HD on day of admission. Work-up reveals volume overload.  Difficult social situation - Disabled veteran who recently relocated from [place]. Estranged from family. Homeless with hx of substance abuse. Resides in extended stay hotel, no money for food at times.  Per most recent renal MD note, pt is losing body weight and needs "significantly lower dry weight at d/c." Attempted to speak with patient about his nutrition status, weights, etc, however he said "I don't need to talk to you, I'm leaving right now." Pt currently in HD. Discussed with HD RN, who told me that there is a possibility the patient is going to leave AMA.   Strongly suspect malnutrition given poor social history, ongoing poor oral intake, and declining weights.  Sodium low at 136 Potassium WNL Magnesium elevated at 2.7 Phosphorus elevated at 6.2  Height: Ht Readings from Last 1 Encounters:  01/06/14 5\' 5"  (1.651 m)    Weight: Wt Readings from Last 1 Encounters:  01/06/14 127 lb 10.3 oz (57.9 kg)    Ideal Body Weight: 136 lb  % Ideal Body Weight: 93%  Wt Readings from Last 20 Encounters:  01/06/14 127 lb 10.3 oz (57.9 kg)  01/02/14 118 lb (53.524 kg)   12/25/13 121 lb 14.6 oz (55.3 kg)  12/19/13 140 lb (63.504 kg)  12/12/13 139 lb 8.8 oz (63.3 kg)  12/04/13 138 lb (62.596 kg)  11/29/13 138 lb 14.2 oz (63 kg)  11/21/13 122 lb 6.4 oz (55.52 kg)  11/17/13 130 lb 8.2 oz (59.2 kg)    Usual Body Weight: unable to obtain  % Usual Body Weight: ---  BMI:  Body mass index is 21.24 kg/(m^2). Normal  Estimated Nutritional Needs: Kcal: 1750-1900 Protein: 80-90 gm Fluid: 1200 ml  Skin: Intact  Diet Order: Renal  EDUCATION NEEDS: -No education needs identified at this time   Intake/Output Summary (Last 24 hours) at 01/06/14 1446 Last data filed at 01/06/14 0910  Gross per 24 hour  Intake    480 ml  Output      0 ml  Net    480 ml    Last BM: 2/27  Labs:   Recent Labs Lab 12/31/13 0350  01/02/14 0650 01/05/14 2328 01/06/14 0500  NA 138  < > 139 136* 136*  K 5.1  < > 3.5* 3.9 4.0  CL 85*  < > 97 90* 92*  CO2 24  < > 25 26 24   BUN 62*  < > 50* 35* 40*  CREATININE 6.66*  < > 5.67* 4.91* 5.44*  CALCIUM 9.8  < > 9.0 9.4 9.1  MG 2.7*  --   --   --   --   PHOS 10.6*  --  7.0*  --  6.2*  GLUCOSE 197*  < >  124* 111* 136*  < > = values in this interval not displayed.  CBG (last 3)  No results found for this basename: GLUCAP,  in the last 72 hours  Scheduled Meds: . albuterol  2.5 mg Nebulization Q4H  . amLODipine  10 mg Oral Daily  . amoxicillin-clavulanate  1 tablet Oral Daily  . aspirin EC  81 mg Oral Daily  . calcium acetate  1,334 mg Oral TID WC  . folic acid  1 mg Oral Daily  . guaiFENesin  600 mg Oral BID  . heparin  5,000 Units Subcutaneous 3 times per day  . lactulose  10 g Oral TID  . metoprolol tartrate  50 mg Oral BID  . multivitamin  1 tablet Oral QHS  . prazosin  4 mg Oral QHS  . sodium chloride  3 mL Intravenous Q12H  . tiotropium  18 mcg Inhalation Daily  . traZODone  50 mg Oral QHS    Continuous Infusions:   Past Medical History  Diagnosis Date  . COPD (chronic obstructive pulmonary  disease)   . Hypertension   . Hep C w/ coma, chronic   . Irregular heartbeat   . ESRD (end stage renal disease) on dialysis     /notes 11/11/2013  . Smoker unmotivated to quit   . Active smoker   . PTSD (post-traumatic stress disorder)   . Shortness of breath     Past Surgical History  Procedure Laterality Date  . Paracentesis  ~ 10/2013    [name] 11/11/2013   Jarold Motto MS, RD, LDN Inpatient Registered Dietitian Pager: [phone] After-hours pager: [phone]

## 2014-01-06 NOTE — H&P (Signed)
Date: 01/06/2014               Patient Name:  Carl Benson MRN: [id number]  DOB: 05/19/1956 Age / Sex: 58 y.o., male   PCP: No Pcp Per Patient         Medical Service: Internal Medicine Teaching Service         Attending Physician: Dr. Rocco Serene, MD    First Contact: Dr. Yetta Barre  Pager: [phone]  Second Contact: Dr. Sherrine Maples Pager: [phone]       After Hours (After 5p/  First Contact Pager: [phone]  weekends / holidays): Second Contact Pager: [phone]   Chief Complaint: shortness of breath  History of Present Illness:   Carl Benson is a 58 year old Veteran with past medical history of ESRD on TTS HD, COPD, HTN, decompensated liver cirrhosis, and afib (not on coumadin) who presents with worsening shortness of breath. Pt states that around 9 pm on the evening of admission while lying down trying to sleep, he experienced acute onset shortness of breath. He also notes associated dry cough, diffuse chest "tightness", increasing LE edema, stable orthopnea, and stable PND. The patient notes no fevers, chills, sore throat, or rhinorrhea. The patient states that none of these symptoms are new, but wax and wane according to his HD schedule. The patient notes receiving only 2 hours of HD today (normally approximately 4 hrs), due to arriving late to HD. He also notes complete medication non-compliance (including recently prescribed Augmentin to be taken for 7 days on discharge) due to financial difficulties, however reports he will be getting his next check March 1st. He is also scheduled [facility]  He was recently hospitalized 2/20-2/23 for partial SBO, but the patient now reports normal bowel movements and passage of gas with no worsening abdominal pain or distention from his baseline.    Outpatient Meds: Pt reports not being able to afford any of his medications or inhaler.    Allergies: Allergies as of 01/05/2014  . (No Known Allergies)   Past Medical  History  Diagnosis Date  . COPD (chronic obstructive pulmonary disease)   . Hypertension   . Hep C w/ coma, chronic   . Irregular heartbeat   . ESRD (end stage renal disease) on dialysis     /notes 11/11/2013  . Smoker unmotivated to quit   . Active smoker   . PTSD (post-traumatic stress disorder)   . Shortness of breath    Past Surgical History  Procedure Laterality Date  . Paracentesis  ~ 10/2013    [name] 11/11/2013   Family History  Problem Relation Age of Onset  . Heart disease Mother   . Hypertension Mother   . Diabetes Mother    History   Social History  . Marital Status: Single    Spouse Name: N/A    Number of Children: N/A  . Years of Education: GED   Occupational History  . Not on file.   Social History Main Topics  . Smoking status: Current Every Day Smoker -- 0.50 packs/day for 15 years    Types: Cigarettes  . Smokeless tobacco: Never Used  . Alcohol Use: No     Comment: DENIES (clean 6 mo) Becomes agitated when asked about EtOH use  . Drug Use: Yes    Special: "Crack" cocaine, Cocaine, Marijuana, Other-see comments     Comment: states he has not had any illegal substances in 6 months  .  Sexual Activity: Not Currently   Other Topics Concern  . Not on file   Social History Narrative   Patient receives income through the [employer], plugged in due to service in [place]. He is estranged from his sons. He does not have a HCPOA.    Review of Systems: General: no fevers, chills, changes in weight, changes in appetite, + fatigue Skin: no rash  HEENT: no blurry vision, hearing changes, sore throat  Pulm: see HPI  CV: no palpitations  Abd: +daily vomiting, +chronic abdominal pain/pressure, no diarrhea/constipation  MC:NOBSJG   Ext: no arthralgias, myalgias  Neuro: no weakness, numbness, or tingling Psych: "delusions from his PTSD"   Physical Exam: Blood pressure 182/99, pulse 122, temperature 98.4 F (36.9 C), temperature source Oral, resp. rate 16, SpO2  93.00%. General: alert, cooperative, appears mildly uncomfortable HEENT: PERRL, EOMI Neck: supple, +JVD Lungs: mildly increased work of respiration, crackles at bilateral bases (R>L) and coarse over RUL, no wheezes Heart: Tachycardic, regular rhythm, no murmurs, gallops, or rubs Abdomen: soft, mildly diffusely ttp (unchanged from last exam), mildly distended (unchanged from last exam), normoactive bowel sounds, no guarding or rebound tenderness  Extremities: 2+ bilateral pitting edema, no LE tenderness Neurologic: alert & oriented X3, cranial nerves II-XII intact, strength    Lab results: Basic Metabolic Panel:  Recent Labs  28/36/62 2328  NA 136*  K 3.9  CL 90*  CO2 26  GLUCOSE 111*  BUN 35*  CREATININE 4.91*  CALCIUM 9.4   Liver Function Tests:  Recent Labs  01/05/14 2328  AST 109*  ALT 64*  ALKPHOS 300*  BILITOT 2.0*  PROT 8.6*  ALBUMIN 3.2*   CBC:  Recent Labs  01/05/14 2328  WBC 11.3*  NEUTROABS 7.6  HGB 12.6*  HCT 35.4*  MCV 76.0*  PLT 99*   Cardiac Enzymes:  Recent Labs  01/05/14 2328  TROPONINI <0.30   BNP:  Recent Labs  01/05/14 2328  PROBNP >70000.0*     Imaging results:  Dg Chest Portable 1 View  01/05/2014   CLINICAL DATA:  58 year old male with shortness of breath. History of COPD and end-stage renal disease.  EXAM: PORTABLE CHEST - 1 VIEW  COMPARISON:  12/30/2013 and prior chest radiographs  FINDINGS: Cardiomegaly and interstitial pulmonary edema noted.  There is no evidence of focal airspace disease, suspicious pulmonary nodule/mass, pleural effusion, or pneumothorax. No acute bony abnormalities are identified. Is  IMPRESSION: Cardiomegaly with interstitial pulmonary edema.   Electronically Signed   By: Carl Benson M.D.   On: 01/05/2014 23:29    Other results: EKG: none available   Assessment: 58 year old with ESRD on HD, decompensated liver cirrhosis, COPD, HTN, and AFib (not on coumadin) who presented on 2/26 with worsening  dyspnea most likely due to volume overload in setting of incomplete HD on 2/26.    Plan:   Volume Overload in setting of ESRD - Pt on TTS schedule at [facility]. Pt reports attending half of session on 2/26 due to arriving late to the facility with worsening dyspnea on night of admission while trying to sleep. Chest xray on 2/2/6 with evidence of  pulmonary edema. Pro-BNP  elevated at >70K (also 2 weeks ago). Pt not hypoxic on admission and no emergent need for HD warranted. Also with LE edema, JVD, and crackles on examination.  Pt with Well's score of 1.5 (tacycardia on admission) indicating low risk for acute pulmonary embolus.  -Nephrology consult in AM for HD possibly in AM due to volume overload  -  Continue renavite daily and phoslo 1334 TID with meals -Monitor daily weight and volume status    COPD -  Pt currently with no acute exacerbation.  Worsening dyspnea thought to be secondary due to volume overload. No evidence of wheezing on exam.  Pt not on home oxygen, rescue or daily maintenance inhalers. Reports having PFT's at [place] in [place] and told he should be on home oxygen.  -Oxygen therapy PRN to keep SpO2> 92% -Albuterol nebulizer Q4hr   -Start Spiriva daily for maintenance therapy  -Consider prednisone if develops exacerbation   Recent HCAP - Pt was recently hospitalized and treated for HCAP from 2/11-2/15 and was to take Levaquin 250 mg Q 48 hr for 14 days however not able to afford medication. Pt then hospitalized 2/20-2/23 and started on Augmentin which received for 3 days and to complete 7 day course until 2/29. Pt afebrile on admission with mild leukocytosis (11.3K) with worsening dyspnea after completing half of HD session. Reports cough is at baseline. Pt still with crackles and course right upper/lower breath sounds (also with pulmonary congestion).  Pt received cefepime 2 g in ED. -Avoid QT prolonging antibiotics   -Augmentin 250-125mg  daily after HD (pt with h/o of  leaving AMA)   Decompensated liver cirrhosis - Pt with liver function testing on admission that appears at baseline. Etiology most likely due to chronic hepatitis C infection. No fever, encephalopathy, significant leukocytosis, or worsening ascites to suggest SBP or warrant paracentesis.   -Continue lactulose 10g TID -Continue hydroxyzine 25 mg Q 6hr PRN pruritis  -Continue folic acid 1 mg daily -Phenergan PRN nausea (avoid zofran in setting of prolonged QT)   Anion Gap Metabolic Acidosis- Pt with AG of 20 on admission (17 on discharge).  Etiology most likely chronic due to hyperphosphatemia in setting of ESRD vs starvation ketoacidosis (elevated lactic acid of 2.28, poor PO intake as unable to afford food). Glucose was 111 on admission.   -Obtain renal function panel in AM -Continue to monitor  -Renal diet    Secondary Hyperparathyroidism  - Pt with corrected calcium 10 on admission. Etiology most likely secondary due to ESRD.   -Obtain phosphorus levels  -Continue phoslo TID with meals  -Continue weekly hectoral with HD    Prolonged QT - Pt with QTc of 643 on 12/30/13.  Troponin was negative on admission.  -Obtain 12-lead EKG in setting of chest tightness -Avoid QT prolonging medications (zofran, levaquin, etc)   Hypertension - Pt hypertensive on admission.  -Resume home metoprolol 50 mg daily  -Resume home amlodipine 10 mg daily   Paroxysmal Atrial Fibrillation - Pt tachycardic 122 on admission in normal sinus rhythm. Pt on AP therapy. CHADS score of 1 (HTN).  -Resume home daily 81 mg aspirin  -Resume home metoprolol 50 mg daily  -Monitor on telemetry   Chronic Thrombocytopenia - Pt with platelet count of 99K on admission, near baseline of 100K. Pt with no recent active bleeding or bruising.  -Monitor for bleeding  -SQ heparin for DVT prophylaxis   Chronic Microcytic Anemia - Pt with Hg of 12.6 on admission, above baseline 10-11. Pt hemodynamically stable without active bleeding.  Etiology most likely due to CKD. Pt receives ESA and erythropoetin weekly at HD.  -Monitor for bleeding  -Transfuse if Hg <7   PTSD - Pt currently with stable mood, however reported delusions on night of admission.  -Resume home prazosin 4 mg daily for nightmares  -Resume home trazadone 50 mg daily at bedtime  -  Continue hydroxyzine 25 mg Q 6hr PRN pruritis/anxiety  Social Issues - Pt reports not being able to afford his medications. He reports he will receive his [facility] check on March 1st. [facility] [place] [facility] [place] [facility]. -SW followed on last admission and not able to provide additional assistance   Diet: Renal   DVT Ppx: SQ heparin TID Code: Full    Dispo: Disposition is deferred at this time, awaiting improvement of current medical problems. Anticipated discharge in approximately 2 day(s).   The patient does not have a current PCP (No Pcp Per Patient) and [facility].  The patient does have transportation limitations that hinder transportation to clinic appointments.  Signed: Otis BraceMarjan Felicie Kocher, MD 01/06/2014, 1:42 AM

## 2014-01-06 NOTE — Progress Notes (Signed)
New Admission:  Arrival Method: Stretcher  Mental Orientation: AOx4 Telemetry: placed Assessment: completed Skin: dry and flaky, cracked heels IV: LFA Pain: documented Tubes: secured Safety Measures: initiated Admission:completed [place] Orientation:completed Family: na  Orders have been reviewed and implemented. Will continue to monitor the patient. Call light has been placed within reach and bed alarm has been activated.  [employer] BSN, RN-BC [phone]

## 2014-01-06 NOTE — ED Notes (Signed)
Patient arrives via EMS out of PTSD meds for 3 months.  States he is having "flare ups" from not having his meds.  Seen here today for the same.

## 2014-01-06 NOTE — H&P (Signed)
Internal Medicine Attending Admission Note Date: 01/06/2014  Patient name: Carl Benson Medical record number: [id number] Date of birth: Jun 29, 1956 Age: 58 y.o. Gender: male  I saw and evaluated the patient. I reviewed the resident's note and I agree with the resident's findings and plan as documented in the resident's note.  Carl Benson is a 58 year old man with a history of end-stage renal disease requiring hemodialysis, hepatitis C with liver cirrhosis, hypertension, COPD, and PTSD who presents with the sudden onset of shortness of breath while lying down on the night of admission. He states he was late for dialysis on the day of admission and only had 2 hours on the machine. Otherwise, he has had a chronic nonproductive cough, lower extremity edema, and stable orthopnea and paroxysmal nocturnal dyspnea. He notes some chest tightness but denies fevers, chills, or shakes. He presented to the emergency department with the above complaint and was found to be volume overloaded with jugular venous distention, pulmonary crackles, and a chest x-ray with increased interstitial markings and Kerly B-lines. He was therefore admitted to the internal medicine teaching service for further evaluation and care.  When he was seen on rounds this morning he felt well. He denied any shortness of breath and was lying comfortably flat in bed. Nonetheless, he is felt to be volume overloaded and nephrology was consulted in order to an initiate hemodialysis to remove fluid that was not removed during his earlier abreviated dialysis session. I fully anticipate that after the hemodialysis run that he will be stable for discharge home to continue on his hemodialysis schedule which includes his run tomorrow at [facility]. Otherwise, he requires no further antibiotics for his right-sided pneumonia since it has resolved clinically and radiographically. We will make another referral to [facility] Care Management in order to help facilitate  on-time transportation to his hemodialysis sessions in an attempt to prevent further readmissions. The [facility] with the [facility] was stressed and he has an appointment scheduled for March 6.

## 2014-01-06 NOTE — Procedures (Signed)
I was present at this dialysis session, have reviewed the session itself and made  appropriate changes  [name] MD (pgr) [phone]    ([phone] 01/06/2014, 2:31 PM

## 2014-01-06 NOTE — Discharge Summary (Signed)
Name: Carl Benson MRN: [id number] DOB: Jan 15, 1956 58 y.o. PCP: No Pcp Per Patient  Date of Admission: 01/05/2014 10:17 PM Date of Discharge: 01/06/2014 Attending Physician: Rocco Serene, MD  Discharge Diagnosis: 1. Volume Overload 2/2 ESRD 2. COPD 3. Metabolic Acidosis 4. Liver Cirrhosis 5. PAF  Discharge Medications:   Medication List         albuterol (2.5 MG/3ML) 0.083% nebulizer solution  Commonly known as:  PROVENTIL  Take 3 mLs (2.5 mg total) by nebulization every 4 (four) hours.     amLODipine 10 MG tablet  Commonly known as:  NORVASC  Take 1 tablet (10 mg total) by mouth daily.     aspirin 81 MG EC tablet  Take 1 tablet (81 mg total) by mouth daily.     calcium acetate 667 MG capsule  Commonly known as:  PHOSLO  Take 2 capsules (1,334 mg total) by mouth 3 (three) times daily with meals.     folic acid 1 MG tablet  Commonly known as:  FOLVITE  Take 1 tablet (1 mg total) by mouth daily.     hydrOXYzine 25 MG tablet  Commonly known as:  ATARAX/VISTARIL  Take 1 tablet (25 mg total) by mouth every 6 (six) hours as needed for itching or anxiety.     lactulose 10 GM/15ML solution  Commonly known as:  CHRONULAC  Take 15 mLs (10 g total) by mouth 3 (three) times daily.     metoprolol 50 MG tablet  Commonly known as:  LOPRESSOR  Take 1 tablet (50 mg total) by mouth 2 (two) times daily.     prazosin 2 MG capsule  Commonly known as:  MINIPRESS  Take 2 capsules (4 mg total) by mouth at bedtime.     tiotropium 18 MCG inhalation capsule  Commonly known as:  SPIRIVA  Place 1 capsule (18 mcg total) into inhaler and inhale daily.     traZODone 50 MG tablet  Commonly known as:  DESYREL  Take 1 tablet (50 mg total) by mouth at bedtime.       Disposition and follow-up:   Mr.Sigismund A Powe was discharged from [facility] in Stable condition.  At the hospital follow up visit please address:  1.  Volume status, compliance w/ medications,  compliance w/ HD sessions. Is patient SOB, abdominal pain on exam?  2.  Labs / imaging needed at time of follow-up: CBC, BMP, CXR  3.  Pending labs/ test needing follow-up: none  Follow-up Appointments:     Follow-up Information   Follow up with [facility].   Specialty:  General Practice   Contact information:   [address] [place] [address] [phone]       Consultations: Treatment Team:  Maree Krabbe, MD  Procedures Performed:   Dg Chest Portable 1 View  01/05/2014   CLINICAL DATA:  59 year old male with shortness of breath. History of COPD and end-stage renal disease.  EXAM: PORTABLE CHEST - 1 VIEW  COMPARISON:  12/30/2013 and prior chest radiographs  FINDINGS: Cardiomegaly and interstitial pulmonary edema noted.  There is no evidence of focal airspace disease, suspicious pulmonary nodule/mass, pleural effusion, or pneumothorax. No acute bony abnormalities are identified. Is  IMPRESSION: Cardiomegaly with interstitial pulmonary edema.   Electronically Signed   By: Laveda Abbe M.D.   On: 01/05/2014 23:29   Admission HPI:  Carl Benson is a 58 year old Veteran with past medical history of ESRD on TTS HD, COPD,  HTN, decompensated liver cirrhosis, and afib (not on coumadin) who presents with worsening shortness of breath. Pt states that around 9 pm on the evening of admission while lying down trying to sleep, he experienced acute onset shortness of breath. He also notes associated dry cough, diffuse chest "tightness", increasing LE edema, stable orthopnea, and stable PND. The patient notes no fevers, chills, sore throat, or rhinorrhea. The patient states that none of these symptoms are new, but wax and wane according to his HD schedule. The patient notes receiving only 2 hours of HD today (normally approximately 4 hrs), due to arriving late to HD. He also notes complete medication non-compliance (including recently prescribed Augmentin to be taken for 7 days on  discharge) due to financial difficulties, however reports he will be getting his next check March 1st. He is also scheduled to [facility] on March 6. He was recently hospitalized 2/20-2/23 for partial SBO, but the patient now reports normal bowel movements and passage of gas with no worsening abdominal pain or distention from his baseline.   Hospital Course by problem list:  1. Volume Overload 2/2 ESRD- Pt on TTS schedule at [facility]. Pt reports attending half of session on 2/26 due to arriving late to the facility with worsening dyspnea on night of admission while trying to sleep. Chest xray on 01/05/14 with evidence of pulmonary edema, NO evidence of infiltrate as seen on previous CXR. Pt not hypoxic on admission and no emergent need for HD warranted. Also with LE edema, JVD, and crackles on examination. Patient received HD on 01/06/14, however, cut it short because he claims he was having cramping. Also continued on renavite daily and phoslo 1334 tid with meals   2. COPD- Patient w/ no acute exacerbation symptoms on exam. Worsening dyspnea thought to be secondary due to volume overload. No evidence of wheezing on exam. Pt not on home oxygen, rescue or daily maintenance inhalers. Reports having PFT's at [place] in [place] and told he should be on home oxygen. Received albuterol nebs q4h as well as started on Spiriva daily for maintenance therapy.  3. Metabolic Acidosis- Pt with AG of 20 on admission (17 on discharge). Etiology most likely chronic due to hyperphosphatemia in setting of ESRD vs starvation ketoacidosis (elevated lactic acid of 2.28, poor PO intake as unable to afford food). Glucose was 111 on admission.   4. Liver Cirrhosis- Pt with liver function testing on admission that appears at baseline. Etiology most likely due to chronic hepatitis C infection. No fever, encephalopathy, significant leukocytosis, or worsening ascites to suggest SBP or warrant paracentesis.  Continued lactulose 10g tid, hydroxyzine 25 mg q6h prn pruritis, folic acid 1 mg daily, and phenergan prn for nausea (avoided zofran in setting of prolonged QT).  5. PAF- Pt tachycardic to 122 on admission in normal sinus rhythm. CHADS score of 1 (HTN). Continued on ASA 81 mg, home metoprolol 50 mg daily, and monitored on telemetry.  Discharge Vitals:   BP 140/99  Pulse 100  Temp(Src) 98.4 F (36.9 C) (Oral)  Resp 16  Ht 5\' 5"  (1.651 m)  Wt 121 lb 0.5 oz (54.9 kg)  BMI 20.14 kg/m2  SpO2 99%  Discharge Labs:  Results for orders placed during the hospital encounter of 01/05/14 (from the past 24 hour(s))  TROPONIN I     Status: None   Collection Time    01/05/14 11:28 PM      Result Value Ref Range   Troponin I <  0.30  <0.30 ng/mL  CBC WITH DIFFERENTIAL     Status: Abnormal   Collection Time    01/05/14 11:28 PM      Result Value Ref Range   WBC 11.3 (*) 4.0 - 10.5 K/uL   RBC 4.66  4.22 - 5.81 MIL/uL   Hemoglobin 12.6 (*) 13.0 - 17.0 g/dL   HCT 79.8 (*) 92.1 - 19.4 %   MCV 76.0 (*) 78.0 - 100.0 fL   MCH 27.0  26.0 - 34.0 pg   MCHC 35.6  30.0 - 36.0 g/dL   RDW 17.4 (*) 08.1 - 44.8 %   Platelets 99 (*) 150 - 400 K/uL   Neutrophils Relative % 67  43 - 77 %   Lymphocytes Relative 23  12 - 46 %   Monocytes Relative 9  3 - 12 %   Eosinophils Relative 1  0 - 5 %   Basophils Relative 0  0 - 1 %   Neutro Abs 7.6  1.7 - 7.7 K/uL   Lymphs Abs 2.6  0.7 - 4.0 K/uL   Monocytes Absolute 1.0  0.1 - 1.0 K/uL   Eosinophils Absolute 0.1  0.0 - 0.7 K/uL   Basophils Absolute 0.0  0.0 - 0.1 K/uL   RBC Morphology ELLIPTOCYTES     WBC Morphology ATYPICAL LYMPHOCYTES     Smear Review LARGE PLATELETS PRESENT    COMPREHENSIVE METABOLIC PANEL     Status: Abnormal   Collection Time    01/05/14 11:28 PM      Result Value Ref Range   Sodium 136 (*) 137 - 147 mEq/L   Potassium 3.9  3.7 - 5.3 mEq/L   Chloride 90 (*) 96 - 112 mEq/L   CO2 26  19 - 32 mEq/L   Glucose, Bld 111 (*) 70 - 99 mg/dL   BUN  35 (*) 6 - 23 mg/dL   Creatinine, Ser 1.85 (*) 0.50 - 1.35 mg/dL   Calcium 9.4  8.4 - 63.1 mg/dL   Total Protein 8.6 (*) 6.0 - 8.3 g/dL   Albumin 3.2 (*) 3.5 - 5.2 g/dL   AST 497 (*) 0 - 37 U/L   ALT 64 (*) 0 - 53 U/L   Alkaline Phosphatase 300 (*) 39 - 117 U/L   Total Bilirubin 2.0 (*) 0.3 - 1.2 mg/dL   GFR calc non Af Amer 12 (*) >90 mL/min   GFR calc Af Amer 14 (*) >90 mL/min  PRO B NATRIURETIC PEPTIDE     Status: Abnormal   Collection Time    01/05/14 11:28 PM      Result Value Ref Range   Pro B Natriuretic peptide (BNP) >70000.0 (*) 0 - 125 pg/mL  I-STAT CG4 LACTIC ACID, ED     Status: Abnormal   Collection Time    01/05/14 11:54 PM      Result Value Ref Range   Lactic Acid, Venous 2.28 (*) 0.5 - 2.2 mmol/L  RENAL FUNCTION PANEL     Status: Abnormal   Collection Time    01/06/14  5:00 AM      Result Value Ref Range   Sodium 136 (*) 137 - 147 mEq/L   Potassium 4.0  3.7 - 5.3 mEq/L   Chloride 92 (*) 96 - 112 mEq/L   CO2 24  19 - 32 mEq/L   Glucose, Bld 136 (*) 70 - 99 mg/dL   BUN 40 (*) 6 - 23 mg/dL   Creatinine, Ser 0.26 (*) 0.50 - 1.35  mg/dL   Calcium 9.1  8.4 - 16.110.5 mg/dL   Phosphorus 6.2 (*) 2.3 - 4.6 mg/dL   Albumin 2.7 (*) 3.5 - 5.2 g/dL   GFR calc non Af Amer 10 (*) >90 mL/min   GFR calc Af Amer 12 (*) >90 mL/min    Signed: Courtney ParisEden W Ennifer Harston, MD 01/06/2014, 4:56 PM   Time Spent on Discharge: 35 minutes Services Ordered on Discharge: none Equipment Ordered on Discharge: none

## 2014-01-06 NOTE — Progress Notes (Signed)
Thank you to the internal medicine team for this referral.  Chart review complete.  Patient is not eligible for [facility] Care Management services because he has not established PCP services.  Collaborated with RNCM and attending resident to establish a firm PCP follow up plan at discharge.  [facility] will engage this patient when a PCP is established in the outpatient clinic.  For any additional questions or new referrals please contact Anibal Henderson BSN RN [facility] Liaison at [phone]
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
## 2014-01-06 NOTE — Consult Note (Signed)
Renal Service Consult Note [facility]  Carl Benson 01/06/2014 Carl Benson D Requesting Physician:  Dr Carl Benson   Reason for Consult:  ESRD pt with SOB and pulm edema HPI: The patient is a 58 y.o. year-old with hx of COPD, HTN, hep C with cirrhosis and recurrent ascites and ESRD on hemodialysis.  Pt presented to ED early this am with SOB and orthopnea.  He was late for HD at last session and only got 1/2 of his treatment.  He was noted to have signs of pulm edema on exam and xray confirmed pulm edema.  Pt was admitted.     He says he has an appt with the [place] in [place] in early March and that he is planning on moving there eventually.  He says that "they will take of me, they have to, I'm a 100% disabled veteran".    ROS  pt denies any CP, fever, prod cough  no abd pain, n/v/d  no jt pain  no skin rash  no confusion  Past Medical History  Past Medical History  Diagnosis Date  . COPD (chronic obstructive pulmonary disease)   . Hypertension   . Hep C w/ coma, chronic   . Irregular heartbeat   . ESRD (end stage renal disease) on dialysis     /notes 11/11/2013  . Smoker unmotivated to quit   . Active smoker   . PTSD (post-traumatic stress disorder)   . Shortness of breath    Past Surgical History  Past Surgical History  Procedure Laterality Date  . Paracentesis  ~ 10/2013    [name] 11/11/2013   Family History  Family History  Problem Relation Age of Onset  . Heart disease Mother   . Hypertension Mother   . Diabetes Mother    Social History  reports that he has been smoking Cigarettes.  He has a 7.5 pack-year smoking history. He has never used smokeless tobacco. He reports that he uses illicit drugs ("Crack" cocaine, Cocaine, Marijuana, and Other-see comments). He reports that he does not drink alcohol. Allergies No Known Allergies Home medications Prior to Admission medications   Not on File   Liver Function Tests  Recent Labs Lab 12/31/13 0350  01/01/14 0835 01/02/14 0650 01/05/14 2328 01/06/14 0500  AST 280* 134*  --  109*  --   ALT 103* 67*  --  64*  --   ALKPHOS 201* 163*  --  300*  --   BILITOT 2.9* 1.7*  --  2.0*  --   PROT 7.4 6.3  --  8.6*  --   ALBUMIN 2.8*  2.8* 2.3* 2.3* 3.2* 2.7*    Recent Labs Lab 12/30/13 2225  LIPASE 37   CBC  Recent Labs Lab 12/30/13 2050 12/31/13 0350 01/02/14 0650 01/05/14 2328  WBC 10.8* 9.5 7.0 11.3*  NEUTROABS 8.2*  --   --  7.6  HGB 12.9* 11.5* 10.9* 12.6*  HCT 38.3* 34.2* 31.2* 35.4*  MCV 80.6 79.5 77.0* 76.0*  PLT 170 151 101* 99*   Basic Metabolic Panel  Recent Labs Lab 12/30/13 2225 12/31/13 0350 01/01/14 0835 01/02/14 0650 01/05/14 2328 01/06/14 0500  NA 136* 138 140 139 136* 136*  K 5.8* 5.1 3.7 3.5* 3.9 4.0  CL 82* 85* 96 97 90* 92*  CO2 19 24 26 25 26 24   GLUCOSE 39* 197* 84 124* 111* 136*  BUN 49* 62* 35* 50* 35* 40*  CREATININE 6.06* 6.66* 4.51* 5.67* 4.91* 5.44*  CALCIUM 10.7* 9.8  9.0 9.0 9.4 9.1  PHOS  --  10.6*  --  7.0*  --  6.2*    Filed Vitals:   01/06/14 1200 01/06/14 1230 01/06/14 1300 01/06/14 1329  BP: 148/88 147/98 144/98 133/92  Pulse: 91 98 91 94  Temp:      TempSrc:      Resp:      Height:      Weight:      SpO2:      Exam: No distress, on HD, slightly tachypneic with talking No rash, cyanosis or gangrene Sclera anicteric, throat clear +JVD Faint bibasilar rales, no wheezing RRR no MRG Abd soft, mild ascites, nontender 1+ pitting bilat LE edema Neuro is nf, ox3   Dialysis: TTS [facility] 4h  F160  55.5kg  2K/2.25 Bath  400/800   Heparin 3000 Hectorol 2     Epo 2800     Venofer none   Assessment: 1 Pulm edema, non-cardiac / volume excess- pt is losing body wt, needs significantly lower dry wt at d/c 2 ESRD on hemodialysis 3 HTN/volume- as above 4 Cirrhosis / hep C 5 AFib 6 COPD 7 Chronic thrombocytopenia 8 Anemia- hb 12, hold esa   Plan- HD today, max UF. Lower dry wt by 3-4 kg most likely   [name]  [name] MD (pgr) [phone]    [phone]) [phone] 01/06/2014, 2:16 PM

## 2014-01-06 NOTE — ED Provider Notes (Addendum)
CSN: [id number]     Arrival date & time 01/06/14  1937 History   First MD Initiated Contact with Patient 01/06/14 2240     Chief Complaint  Patient presents with  . Depression     (Consider location/radiation/quality/duration/timing/severity/associated sxs/prior Treatment) HPI Comments: Pt reports he is depressed and having delusions.  However he cannot give me any concrete examples.  He reports he has a h/o PTSD and also bipolar and schizophrenia although those are not on his records.  He was just discharged from the hospital yesterday but there is no mention of psychiatric illness.  Pt reports that they didn;t want to listen to him.  He had dialysis yesterday will be due again tomorrow.  No hallucinations or SI or HI.  He is requesting transport to [facility]   He is frustrated, reports he was not given any Rx's and he has little money.  He tells me that he is supposed to be on pysch meds, cannot name any to me, hasn't been on any for 9 months.  He used to live in [place] and came back to [place] in January which used to be his home.    The history is provided by the patient and medical records.    Past Medical History  Diagnosis Date  . COPD (chronic obstructive pulmonary disease)   . Hypertension   . Hep C w/ coma, chronic   . Irregular heartbeat   . ESRD (end stage renal disease) on dialysis     /notes 11/11/2013  . Smoker unmotivated to quit   . Active smoker   . PTSD (post-traumatic stress disorder)   . Shortness of breath    Past Surgical History  Procedure Laterality Date  . Paracentesis  ~ 10/2013    [name] 11/11/2013   Family History  Problem Relation Age of Onset  . Heart disease Mother   . Hypertension Mother   . Diabetes Mother    History  Substance Use Topics  . Smoking status: Current Every Day Smoker -- 0.50 packs/day for 15 years    Types: Cigarettes  . Smokeless tobacco: Never Used  . Alcohol Use: No     Comment: DENIES (clean 6 mo) Becomes agitated  when asked about EtOH use    Review of Systems  Constitutional: Positive for fatigue.  Respiratory: Positive for cough and shortness of breath.   Gastrointestinal: Negative for nausea, vomiting and abdominal pain.  Psychiatric/Behavioral: Positive for dysphoric mood. Negative for suicidal ideas and self-injury.       Depressed  All other systems reviewed and are negative.      Allergies  Review of patient's allergies indicates no known allergies.  Home Medications   Current Outpatient Rx  Name  Route  Sig  Dispense  Refill  . albuterol (PROVENTIL) (2.5 MG/3ML) 0.083% nebulizer solution   Nebulization   Take 3 mLs (2.5 mg total) by nebulization every 4 (four) hours.   75 mL   12   . amLODipine (NORVASC) 10 MG tablet   Oral   Take 1 tablet (10 mg total) by mouth daily.   30 tablet   5   . aspirin EC 81 MG EC tablet   Oral   Take 1 tablet (81 mg total) by mouth daily.   30 tablet   5   . calcium acetate (PHOSLO) 667 MG capsule   Oral   Take 2 capsules (1,334 mg total) by mouth 3 (three) times daily with meals.   60 capsule  5   . folic acid (FOLVITE) 1 MG tablet   Oral   Take 1 tablet (1 mg total) by mouth daily.   30 tablet   5   . hydrOXYzine (ATARAX/VISTARIL) 25 MG tablet   Oral   Take 1 tablet (25 mg total) by mouth every 6 (six) hours as needed for itching or anxiety.   30 tablet   0   . lactulose (CHRONULAC) 10 GM/15ML solution   Oral   Take 15 mLs (10 g total) by mouth 3 (three) times daily.   240 mL   0   . metoprolol (LOPRESSOR) 50 MG tablet   Oral   Take 1 tablet (50 mg total) by mouth 2 (two) times daily.   30 tablet   5   . prazosin (MINIPRESS) 2 MG capsule   Oral   Take 2 capsules (4 mg total) by mouth at bedtime.   60 capsule   5   . tiotropium (SPIRIVA) 18 MCG inhalation capsule   Inhalation   Place 1 capsule (18 mcg total) into inhaler and inhale daily.   30 capsule   12   . traZODone (DESYREL) 50 MG tablet   Oral    Take 1 tablet (50 mg total) by mouth at bedtime.   30 tablet   5    BP 162/93  Pulse 55  Temp(Src) 97.9 F (36.6 C) (Oral)  Resp 20  Ht 5\' 5"  (1.651 m)  Wt 124 lb 12.8 oz (56.609 kg)  BMI 20.77 kg/m2  SpO2 96% Physical Exam  Nursing note and vitals reviewed. Constitutional: He appears well-developed. He appears cachectic. He has a sickly appearance. No distress.  HENT:  Head: Normocephalic and atraumatic.  Eyes: Conjunctivae and EOM are normal. No scleral icterus.  Neck: JVD present.  Cardiovascular: Normal rate, regular rhythm and intact distal pulses.   Pulmonary/Chest: Accessory muscle usage present. Tachypnea noted. He has no decreased breath sounds. He has wheezes. He has rhonchi.  Abdominal: Soft. He exhibits distension. There is no tenderness. There is no rebound.  Musculoskeletal: He exhibits no edema.  Neurological: He is alert.  Skin: Skin is warm and dry. No rash noted. He is not diaphoretic. No pallor.  Psychiatric: His mood appears not anxious. His affect is labile. His speech is not slurred. He is slowed. He exhibits a depressed mood. He expresses no suicidal ideation. He expresses no suicidal plans and no homicidal plans. He exhibits normal recent memory.  Argumentative at times    ED Course  Procedures (including critical care time) Labs Review Labs Reviewed  CBC - Abnormal; Notable for the following:    RBC 4.14 (*)    Hemoglobin 11.3 (*)    HCT 32.3 (*)    RDW 23.0 (*)    Platelets 83 (*)    All other components within normal limits  COMPREHENSIVE METABOLIC PANEL - Abnormal; Notable for the following:    Chloride 92 (*)    Glucose, Bld 107 (*)    Creatinine, Ser 3.31 (*)    Albumin 3.0 (*)    AST 100 (*)    ALT 57 (*)    Alkaline Phosphatase 254 (*)    Total Bilirubin 2.0 (*)    GFR calc non Af Amer 19 (*)    GFR calc Af Amer 22 (*)    All other components within normal limits  SALICYLATE LEVEL - Abnormal; Notable for the following:     Salicylate Lvl <2.0 (*)    All  other components within normal limits  PRO B NATRIURETIC PEPTIDE - Abnormal; Notable for the following:    Pro B Natriuretic peptide (BNP) >70000.0 (*)    All other components within normal limits  ACETAMINOPHEN LEVEL  ETHANOL  AMMONIA  URINE RAPID DRUG SCREEN (HOSP PERFORMED)  APTT  PROTIME-INR   Imaging Review Dg Chest Port 1 View  01/06/2014   CLINICAL DATA:  Dyspnea.  EXAM: PORTABLE CHEST - 1 VIEW  COMPARISON:  Chest radiograph performed 01/05/2014  FINDINGS: The lungs are well-aerated. Vascular congestion is noted. Bibasilar airspace opacification may reflect multifocal pneumonia or pulmonary edema. No pleural effusion or pneumothorax is seen.  The cardiomediastinal silhouette is mildly enlarged. No acute osseous abnormalities are identified. Minimal lucency along the left chest wall is thought to reflect the interface with the patient's left arm.  IMPRESSION: Vascular congestion and mild cardiomegaly. Bibasilar airspace opacification may reflect multifocal pneumonia or mild pulmonary edema.   Electronically Signed   By: Roanna RaiderJeffery  Chang M.D.   On: 01/06/2014 23:35   Dg Chest Portable 1 View  01/05/2014   CLINICAL DATA:  58 year old male with shortness of breath. History of COPD and end-stage renal disease.  EXAM: PORTABLE CHEST - 1 VIEW  COMPARISON:  12/30/2013 and prior chest radiographs  FINDINGS: Cardiomegaly and interstitial pulmonary edema noted.  There is no evidence of focal airspace disease, suspicious pulmonary nodule/mass, pleural effusion, or pneumothorax. No acute bony abnormalities are identified. Is  IMPRESSION: Cardiomegaly with interstitial pulmonary edema.   Electronically Signed   By: Laveda AbbeJeff  Hu M.D.   On: 01/05/2014 23:29     EKG Interpretation At time 23:59, SR at rate 78, biatrial enlargement, LVH with repol abn's.  Prolonged QT interval.  Abn ECG.         11:50 PM Pt with some CHF and effusions on CXR.  Pt's K+ is normal.  Pt is  tachypneic with elevated JVD suggestive of some CHF exacerbation.  BNP is pending.  Will consult internal medicine to see pt regarding some CHF and needing regular dialysis.  Pt may benefit from dialysis and even consideration of paracentesis for abd swelling.  12:05 AM I have discussed at length again that we cannot directly send him [facility] as he desires.  Pt seems to have a notion that be being brought to the ED here, that we have the capability to directly send to the [facility].  Will need stabilization first.  Will consult internal medicine to see and evaluate.  My impression is to admit overnight, dialyze in the AM, have psychiatry see pt and begin medications and consider transfer at that time if still indicated.    MDM   Final diagnoses:  Depression  CHF (congestive heart failure)  End stage renal disease on dialysis  History of cirrhosis    Pt is generally ill appearing, argumentative, demanding.  Pt shows no behavior consistent with SI, denies both.  He admits to depression.  He is wanting transport to [facility]. I don't see any definitive psych emergent condition.  However he has elevated JVD, tachypnea, rales on exam.  Will get BNP, CXR to see if pt may need re-admission.  Electrolytes are ok, anemia and elevated LFT's from cirrhosis are all at baseline.      Gavin PoundMichael Y. Oletta LamasGhim, MD 01/07/14 [id number]  Gavin PoundMichael Y. Oletta LamasGhim, MD 01/07/14 [id number]

## 2014-01-06 NOTE — ED Notes (Signed)
Pt states that he does not make urine because of dialysis and cannot give a urine sample.

## 2014-01-06 NOTE — ED Provider Notes (Signed)
Medical screening examination/treatment/procedure(s) were conducted as a shared visit with non-physician practitioner(s) and myself.  I personally evaluated the patient during the encounter.     Sunnie Nielsen, MD 01/06/14 [phone]

## 2014-01-06 NOTE — Progress Notes (Signed)
Patient discharged to home. Reiterated with the patient the importance of going to his hemodialysis treatments. Patient AVS reviewed. Patient verbalized understanding of medications and follow-up appointment with the [facility] medical center.  Patient remains stable; no signs or symptoms of distress.  Patient educated to return to the ER in cases of SOB, dizziness, fever, chest pain, or fainting. Patient given cab voucher for transport home.

## 2014-01-07 DIAGNOSIS — Z8659 Personal history of other mental and behavioral disorders: Secondary | ICD-10-CM

## 2014-01-07 DIAGNOSIS — E872 Acidosis, unspecified: Secondary | ICD-10-CM

## 2014-01-07 DIAGNOSIS — F319 Bipolar disorder, unspecified: Secondary | ICD-10-CM

## 2014-01-07 DIAGNOSIS — D649 Anemia, unspecified: Secondary | ICD-10-CM

## 2014-01-07 DIAGNOSIS — I2510 Atherosclerotic heart disease of native coronary artery without angina pectoris: Secondary | ICD-10-CM

## 2014-01-07 DIAGNOSIS — E213 Hyperparathyroidism, unspecified: Secondary | ICD-10-CM

## 2014-01-07 DIAGNOSIS — K7460 Unspecified cirrhosis of liver: Secondary | ICD-10-CM

## 2014-01-07 DIAGNOSIS — F3289 Other specified depressive episodes: Secondary | ICD-10-CM

## 2014-01-07 DIAGNOSIS — D696 Thrombocytopenia, unspecified: Secondary | ICD-10-CM

## 2014-01-07 DIAGNOSIS — I120 Hypertensive chronic kidney disease with stage 5 chronic kidney disease or end stage renal disease: Secondary | ICD-10-CM

## 2014-01-07 DIAGNOSIS — F329 Major depressive disorder, single episode, unspecified: Secondary | ICD-10-CM

## 2014-01-07 DIAGNOSIS — F4310 Post-traumatic stress disorder, unspecified: Secondary | ICD-10-CM

## 2014-01-07 DIAGNOSIS — J449 Chronic obstructive pulmonary disease, unspecified: Secondary | ICD-10-CM

## 2014-01-07 DIAGNOSIS — N186 End stage renal disease: Secondary | ICD-10-CM

## 2014-01-07 DIAGNOSIS — I4891 Unspecified atrial fibrillation: Secondary | ICD-10-CM

## 2014-01-07 LAB — CBC
HCT: 32.1 % — ABNORMAL LOW (ref 39.0–52.0)
Hemoglobin: 11.3 g/dL — ABNORMAL LOW (ref 13.0–17.0)
MCH: 27 pg (ref 26.0–34.0)
MCHC: 35.2 g/dL (ref 30.0–36.0)
MCV: 76.8 fL — ABNORMAL LOW (ref 78.0–100.0)
PLATELETS: 122 10*3/uL — AB (ref 150–400)
RBC: 4.18 MIL/uL — ABNORMAL LOW (ref 4.22–5.81)
RDW: 22.2 % — AB (ref 11.5–15.5)
WBC: 10.6 10*3/uL — AB (ref 4.0–10.5)

## 2014-01-07 LAB — APTT: aPTT: 41 seconds — ABNORMAL HIGH (ref 24–37)

## 2014-01-07 LAB — RENAL FUNCTION PANEL
Albumin: 2.8 g/dL — ABNORMAL LOW (ref 3.5–5.2)
BUN: 31 mg/dL — ABNORMAL HIGH (ref 6–23)
CALCIUM: 9.2 mg/dL (ref 8.4–10.5)
CO2: 25 mEq/L (ref 19–32)
CREATININE: 3.85 mg/dL — AB (ref 0.50–1.35)
Chloride: 90 mEq/L — ABNORMAL LOW (ref 96–112)
GFR calc Af Amer: 18 mL/min — ABNORMAL LOW (ref >90)
GFR calc non Af Amer: 16 mL/min — ABNORMAL LOW (ref >90)
GLUCOSE: 148 mg/dL — AB (ref 70–99)
PHOSPHORUS: 5 mg/dL — AB (ref 2.3–4.6)
Potassium: 4.5 mEq/L (ref 3.7–5.3)
Sodium: 135 mEq/L — ABNORMAL LOW (ref 137–147)

## 2014-01-07 LAB — PROTIME-INR
INR: 1.06 (ref 0.00–1.49)
Prothrombin Time: 13.6 seconds (ref 11.6–15.2)

## 2014-01-07 MED ORDER — HEPARIN SODIUM (PORCINE) 1000 UNIT/ML DIALYSIS
3000.0000 [IU] | Freq: Once | INTRAMUSCULAR | Status: AC
  Administered 2014-01-07: 3000 [IU] via INTRAVENOUS_CENTRAL

## 2014-01-07 MED ORDER — LIDOCAINE HCL (PF) 1 % IJ SOLN: 5.0000 mL | INTRAMUSCULAR | Status: DC | PRN

## 2014-01-07 MED ORDER — PROMETHAZINE HCL 25 MG PO TABS: 12.5000 mg | ORAL_TABLET | Freq: Four times a day (QID) | ORAL | Status: DC | PRN

## 2014-01-07 MED ORDER — OXYCODONE-ACETAMINOPHEN 5-325 MG PO TABS
1.0000 | ORAL_TABLET | Freq: Once | ORAL | Status: AC
  Administered 2014-01-07: 1 via ORAL
  Filled 2014-01-07: qty 1

## 2014-01-07 MED ORDER — LACTULOSE 10 GM/15ML PO SOLN
10.0000 g | Freq: Three times a day (TID) | ORAL | Status: DC
  Administered 2014-01-07 – 2014-01-10 (×11): 10 g via ORAL
  Filled 2014-01-07 (×15): qty 15

## 2014-01-07 MED ORDER — LIDOCAINE-PRILOCAINE 2.5-2.5 % EX CREA: 1.0000 "application " | TOPICAL_CREAM | CUTANEOUS | Status: DC | PRN

## 2014-01-07 MED ORDER — SODIUM CHLORIDE 0.9 % IV SOLN: 100.0000 mL | INTRAVENOUS | Status: DC | PRN

## 2014-01-07 MED ORDER — ALBUTEROL SULFATE (2.5 MG/3ML) 0.083% IN NEBU: 2.5000 mg | INHALATION_SOLUTION | Freq: Four times a day (QID) | RESPIRATORY_TRACT | Status: DC | PRN

## 2014-01-07 MED ORDER — ASPIRIN EC 81 MG PO TBEC
81.0000 mg | DELAYED_RELEASE_TABLET | Freq: Every day | ORAL | Status: DC
  Administered 2014-01-07 – 2014-01-10 (×4): 81 mg via ORAL
  Filled 2014-01-07 (×5): qty 1

## 2014-01-07 MED ORDER — QUETIAPINE FUMARATE 50 MG PO TABS
50.0000 mg | ORAL_TABLET | Freq: Every day | ORAL | Status: DC
  Administered 2014-01-07 – 2014-01-10 (×4): 50 mg via ORAL
  Filled 2014-01-07 (×5): qty 1

## 2014-01-07 MED ORDER — HEPARIN SODIUM (PORCINE) 1000 UNIT/ML DIALYSIS: 1000.0000 [IU] | INTRAMUSCULAR | Status: DC | PRN

## 2014-01-07 MED ORDER — PRAZOSIN HCL 2 MG PO CAPS
4.0000 mg | ORAL_CAPSULE | Freq: Every day | ORAL | Status: DC
  Filled 2014-01-07: qty 2

## 2014-01-07 MED ORDER — NEPRO/CARBSTEADY PO LIQD
237.0000 mL | Freq: Three times a day (TID) | ORAL | Status: DC
  Administered 2014-01-07 – 2014-01-09 (×6): 237 mL via ORAL
  Filled 2014-01-07 (×10): qty 237

## 2014-01-07 MED ORDER — ALTEPLASE 2 MG IJ SOLR: 2.0000 mg | Freq: Once | INTRAMUSCULAR | Status: DC | PRN

## 2014-01-07 MED ORDER — HEPARIN SODIUM (PORCINE) 5000 UNIT/ML IJ SOLN
5000.0000 [IU] | Freq: Three times a day (TID) | INTRAMUSCULAR | Status: DC
  Filled 2014-01-07 (×16): qty 1

## 2014-01-07 MED ORDER — PENTAFLUOROPROP-TETRAFLUOROETH EX AERO: 1.0000 "application " | INHALATION_SPRAY | CUTANEOUS | Status: DC | PRN

## 2014-01-07 MED ORDER — CALCIUM ACETATE 667 MG PO CAPS
1334.0000 mg | ORAL_CAPSULE | Freq: Three times a day (TID) | ORAL | Status: DC
  Administered 2014-01-07 – 2014-01-10 (×10): 1334 mg via ORAL
  Filled 2014-01-07 (×16): qty 2

## 2014-01-07 MED ORDER — SERTRALINE HCL 25 MG PO TABS
25.0000 mg | ORAL_TABLET | Freq: Every day | ORAL | Status: DC
  Administered 2014-01-07 – 2014-01-10 (×4): 25 mg via ORAL
  Filled 2014-01-07 (×5): qty 1

## 2014-01-07 MED ORDER — METOPROLOL TARTRATE 50 MG PO TABS
50.0000 mg | ORAL_TABLET | Freq: Two times a day (BID) | ORAL | Status: DC
  Administered 2014-01-07: 50 mg via ORAL
  Filled 2014-01-07 (×3): qty 1

## 2014-01-07 MED ORDER — GUAIFENESIN ER 600 MG PO TB12
600.0000 mg | ORAL_TABLET | Freq: Two times a day (BID) | ORAL | Status: DC
  Administered 2014-01-07 – 2014-01-10 (×9): 600 mg via ORAL
  Filled 2014-01-07 (×11): qty 1

## 2014-01-07 MED ORDER — HEPARIN SODIUM (PORCINE) 1000 UNIT/ML DIALYSIS: 3000.0000 [IU] | Freq: Once | INTRAMUSCULAR | Status: DC

## 2014-01-07 MED ORDER — AMLODIPINE BESYLATE 10 MG PO TABS
10.0000 mg | ORAL_TABLET | Freq: Every day | ORAL | Status: DC
  Filled 2014-01-07: qty 1

## 2014-01-07 MED ORDER — SODIUM CHLORIDE 0.9 % IJ SOLN
3.0000 mL | Freq: Two times a day (BID) | INTRAMUSCULAR | Status: DC
  Administered 2014-01-07 – 2014-01-10 (×9): 3 mL via INTRAVENOUS

## 2014-01-07 MED ORDER — FOLIC ACID 1 MG PO TABS
1.0000 mg | ORAL_TABLET | Freq: Every day | ORAL | Status: DC
  Administered 2014-01-07 – 2014-01-10 (×4): 1 mg via ORAL
  Filled 2014-01-07 (×5): qty 1

## 2014-01-07 MED ORDER — TIOTROPIUM BROMIDE MONOHYDRATE 18 MCG IN CAPS
18.0000 ug | ORAL_CAPSULE | Freq: Every day | RESPIRATORY_TRACT | Status: DC
  Administered 2014-01-08 – 2014-01-10 (×2): 18 ug via RESPIRATORY_TRACT
  Filled 2014-01-07: qty 5

## 2014-01-07 MED ORDER — METOPROLOL TARTRATE 12.5 MG HALF TABLET
12.5000 mg | ORAL_TABLET | Freq: Two times a day (BID) | ORAL | Status: DC
  Administered 2014-01-07 – 2014-01-10 (×7): 12.5 mg via ORAL
  Filled 2014-01-07 (×10): qty 1

## 2014-01-07 MED ORDER — NEPRO/CARBSTEADY PO LIQD: 237.0000 mL | ORAL | Status: DC | PRN

## 2014-01-07 MED ORDER — OXYCODONE HCL 5 MG PO TABS
5.0000 mg | ORAL_TABLET | Freq: Once | ORAL | Status: AC
  Administered 2014-01-07: 5 mg via ORAL
  Filled 2014-01-07: qty 1

## 2014-01-07 MED ORDER — TRAZODONE HCL 50 MG PO TABS
50.0000 mg | ORAL_TABLET | Freq: Every day | ORAL | Status: DC
  Administered 2014-01-07 – 2014-01-10 (×4): 50 mg via ORAL
  Filled 2014-01-07 (×5): qty 1

## 2014-01-07 MED ORDER — HYDROXYZINE HCL 25 MG PO TABS
25.0000 mg | ORAL_TABLET | Freq: Four times a day (QID) | ORAL | Status: DC | PRN
  Administered 2014-01-09: 25 mg via ORAL
  Filled 2014-01-07: qty 1

## 2014-01-07 MED ORDER — AMLODIPINE BESYLATE 5 MG PO TABS
5.0000 mg | ORAL_TABLET | Freq: Every day | ORAL | Status: DC
  Administered 2014-01-07: 5 mg via ORAL
  Filled 2014-01-07 (×2): qty 1

## 2014-01-07 MED ORDER — BENZONATATE 100 MG PO CAPS
100.0000 mg | ORAL_CAPSULE | Freq: Two times a day (BID) | ORAL | Status: DC | PRN
  Filled 2014-01-07: qty 1
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
## 2014-01-07 NOTE — Progress Notes (Signed)
[facility] Progress Note   Subjective: Pt was discharged yest then readmitted for psych / PTSD issues and [place].  No SOB, +abd pain, "need to get this fluid off" referring to his abdomen. 3kg off yest HD w high BP's  Filed Vitals:   01/07/14 0124 01/07/14 0211 01/07/14 0547 01/07/14 1109  BP: 140/76 138/97 105/68 105/80  Pulse: 103 97 86 90  Temp:  98.4 F (36.9 C) 97.7 F (36.5 C)   TempSrc: Oral Oral Oral   Resp: 22 20 20    Height:      Weight:  54.4 kg (119 lb 14.9 oz) 54.84 kg (120 lb 14.4 oz)   SpO2: 96% 100% 100%    Exam: Alert, no distress, lying on his side No jvd Clear throughout bilat, no rales or wheezing RRR no MRG Abd moderate ascites, mild diffuse abd tenderness, no rebound No LE or UE edema R arm AVG patent  Dialysis: TTS [facility]  4h   F160   55.5kg   2K/2.25 Bath   400/800   Heparin 3000  Hectorol 2   Epo 2800   Venofer none   Assessment: 1 PTSD / anxiety / poor social support system- per primary 2 ESRD on hemodialysis 3 Abd pain / cirrhosis w ascites- recurrent issue, should improve with vol removal 4 Afib on lowdose MTP 5 COPD 6 Chronic thrombocytopenia 7 Anemia Hb 11, hold esa 8 2HPT- cont D/phoslo 9 HTN- BP meds lowered, will assist w vol removal  Plan- HD today, max UF as tolerated, lower dry wt (losing body wt), dec BP meds as needed     [name] Moriya Mitchell MD  pager [phone]    cell [phone]  01/07/2014, 12:03 PM     Recent Labs Lab 01/02/14 0650  01/06/14 0500 01/06/14 2025 01/07/14 0425  NA 139  < > 136* 137 135*  K 3.5*  < > 4.0 5.0 4.5  CL 97  < > 92* 92* 90*  CO2 25  < > 24 26 25   GLUCOSE 124*  < > 136* 107* 148*  BUN 50*  < > 40* 23 31*  CREATININE 5.67*  < > 5.44* 3.31* 3.85*  CALCIUM 9.0  < > 9.1 9.2 9.2  PHOS 7.0*  --  6.2*  --  5.0*  < > = values in this interval not displayed.  Recent Labs Lab 01/01/14 0835  01/05/14 2328 01/06/14 0500 01/06/14 2025  01/07/14 0425  AST 134*  --  109*  --  100*  --   ALT 67*  --  64*  --  57*  --   ALKPHOS 163*  --  300*  --  254*  --   BILITOT 1.7*  --  2.0*  --  2.0*  --   PROT 6.3  --  8.6*  --  8.1  --   ALBUMIN 2.3*  < > 3.2* 2.7* 3.0* 2.8*  < > = values in this interval not displayed.  Recent Labs Lab 01/02/14 0650 01/05/14 2328 01/06/14 2025  WBC 7.0 11.3* 10.2  NEUTROABS  --  7.6  --   HGB 10.9* 12.6* 11.3*  HCT 31.2* 35.4* 32.3*  MCV 77.0* 76.0* 78.0  PLT 101* 99* 83*   . amLODipine  5 mg Oral QHS  . aspirin EC  81 mg Oral Daily  . calcium acetate  1,334 mg Oral TID WC  . folic acid  1 mg Oral Daily  .  guaiFENesin  600 mg Oral BID  . heparin  5,000 Units Subcutaneous 3 times per day  . lactulose  10 g Oral TID  . metoprolol  12.5 mg Oral BID  . sodium chloride  3 mL Intravenous Q12H  . tiotropium  18 mcg Inhalation Daily  . traZODone  50 mg Oral QHS     albuterol, benzonatate, hydrOXYzine, promethazine

## 2014-01-07 NOTE — H&P (Signed)
Internal Medicine Attending Admission Note Date: 01/07/2014  Patient name: Carl Benson Medical record number: [id number] Date of birth: [birth year] Age: 58 y.o. Gender: male  I saw and evaluated the patient. I reviewed the resident's note and I agree with the resident's findings and plan as documented in the resident's note.  Carl Benson is a 58 year old man with a history of end-stage renal disease requiring hemodialysis, hypertension, COPD, liver cirrhosis secondary to hepatitis C, depression, and PTSD who presents with the chief complaint of "Everything has went that hell". He states that his PTSD is worsening and he has not been able to afford any of his medications for the last 9 months. He will not give any specifics about how his PTSD is worsening but when asked about flashbacks he said yes. He called 911 in hopes of getting taken to the [facility] but EMS told them it was too far away and he was brought to the [facility] the ED instead. He is admitted to the internal medicine teaching service to help facilitate psychiatric care either locally or through the [facility]. He is without any complaints when seen on rounds this morning.  We will ask psychiatry to evaluate the patient to see if he requires any intervention for his PTSD. We have also consulted social work in hopes of helping with the facilitation of transfer to the [place] for psychiatric care should they accept him. Otherwise we will continue with his chronic medications and hemodialysis therapy while an inpatient.

## 2014-01-07 NOTE — Progress Notes (Signed)
Utilization Review Completed.   Taz Vanness, RN, BSN Nurse Case Manager  

## 2014-01-07 NOTE — H&P (Signed)
Date: 01/07/2014               Patient Name:  Carl Benson MRN: [id number]  DOB: Apr 15, 1956 Age / Sex: 58 y.o., male   PCP: No Pcp Per Patient         Medical Service: Internal Medicine Teaching Service         Attending Physician: Dr. Josem Kaufmann    First Contact: Dr. Yetta Barre  Pager: [phone]  Second Contact: Dr. Garald Braver  Pager: [phone]       After Hours (After 5p/  First Contact Pager: [phone]  weekends / holidays): Second Contact Pager: [phone]   Chief Complaint: depression, PTSD  History of Present Illness:   Carl Benson is a 58 year old Veteran with past medical history of ESRD on TTS HD, COPD, HTN, decompensated liver cirrhosis, and afib (not on coumadin) who presents with depression.  The patient was recently hospitalized 2/26-2/27 for an admission for shortness of breath, due to missing part of an HD session 2/26.  He went home on 2/27, but upon arrival home, he states "everything went hell", with worsening of his PTSD as he is on no medcations. He reports depression and delusions with  no suicidal ideation. He states that he felt he needed to get to the mental health department in the [place], so he called 911, but EMS said that the [place] was too far, so they brought him to our ED instead.  He notes no worsening of his SOB from baseline. His only complaint is abdominal distension and is asking to have HD at this time. We were asked to admit the patient for the reason of transferring the patient to the [place].  Outpatient Meds: Pt reports not being able to afford any of his medications or inhaler.   No current facility-administered medications for this encounter.   Current Outpatient Prescriptions  Medication Sig Dispense Refill  . albuterol (PROVENTIL) (2.5 MG/3ML) 0.083% nebulizer solution Take 3 mLs (2.5 mg total) by nebulization every 4 (four) hours.  75 mL  12  . amLODipine (NORVASC) 10 MG tablet Take 1 tablet (10 mg total) by mouth daily.  30 tablet  5  . aspirin EC 81 MG EC tablet  Take 1 tablet (81 mg total) by mouth daily.  30 tablet  5  . calcium acetate (PHOSLO) 667 MG capsule Take 2 capsules (1,334 mg total) by mouth 3 (three) times daily with meals.  60 capsule  5  . folic acid (FOLVITE) 1 MG tablet Take 1 tablet (1 mg total) by mouth daily.  30 tablet  5  . hydrOXYzine (ATARAX/VISTARIL) 25 MG tablet Take 1 tablet (25 mg total) by mouth every 6 (six) hours as needed for itching or anxiety.  30 tablet  0  . lactulose (CHRONULAC) 10 GM/15ML solution Take 15 mLs (10 g total) by mouth 3 (three) times daily.  240 mL  0  . metoprolol (LOPRESSOR) 50 MG tablet Take 1 tablet (50 mg total) by mouth 2 (two) times daily.  30 tablet  5  . prazosin (MINIPRESS) 2 MG capsule Take 2 capsules (4 mg total) by mouth at bedtime.  60 capsule  5  . tiotropium (SPIRIVA) 18 MCG inhalation capsule Place 1 capsule (18 mcg total) into inhaler and inhale daily.  30 capsule  12  . traZODone (DESYREL) 50 MG tablet Take 1 tablet (50 mg total) by mouth at bedtime.  30 tablet  5    Allergies: Allergies as of 01/06/2014  . (  No Known Allergies)   Past Medical History  Diagnosis Date  . COPD (chronic obstructive pulmonary disease)   . Hypertension   . Hep C w/ coma, chronic   . Irregular heartbeat   . ESRD (end stage renal disease) on dialysis     /notes 11/11/2013  . Smoker unmotivated to quit   . Active smoker   . PTSD (post-traumatic stress disorder)   . Shortness of breath    Past Surgical History  Procedure Laterality Date  . Paracentesis  ~ 10/2013    [name]/notes 11/11/2013   Family History  Problem Relation Age of Onset  . Heart disease Mother   . Hypertension Mother   . Diabetes Mother    History   Social History  . Marital Status: Single    Spouse Name: N/A    Number of Children: N/A  . Years of Education: GED   Occupational History  . Not on file.   Social History Main Topics  . Smoking status: Current Every Day Smoker -- 0.50 packs/day for 15 years    Types: Cigarettes    . Smokeless tobacco: Never Used  . Alcohol Use: No     Comment: DENIES (clean 6 mo) Becomes agitated when asked about EtOH use  . Drug Use: Yes    Special: "Crack" cocaine, Cocaine, Marijuana, Other-see comments     Comment: states he has not had any illegal substances in 6 months  . Sexual Activity: Not Currently   Other Topics Concern  . Not on file   Social History Narrative   [place] [facility] [place], [place]. He is estranged from his sons. He does not have a HCPOA.    Review of Systems: General: no fevers, chills, changes in weight, changes in appetite Skin: no rash  HEENT: no blurry vision, hearing changes, sore throat  Pulm: see HPI  CV: no palpitations  Abd: +daily vomiting, +chronic abdominal pain/pressure/distension, no diarrhea/constipation  ZO:[id number]:anuric   Ext: no arthralgias, myalgias  Neuro: no weakness, numbness, or tingling Psych: "delusions from his PTSD"  Physical Exam: Blood pressure 162/93, pulse 55, temperature 97.9 F (36.6 C), temperature source Oral, resp. rate 20, height 5\' 5"  (1.651 m), weight 124 lb 12.8 oz (56.609 kg), SpO2 96.00%. General: alert, NAD HEENT: PERRL, EOMI Neck: supple, less JVD than yesterday Lungs: mildly increased work of respiration, decreased breath sound/mild crackles at bases (imrpvoed from yesterday), no wheezes Heart: regular rate and rhythm, no murmurs, gallops, or rubs Abdomen: soft, mildly diffusely ttp (unchanged from last exam), mildly distended (unchanged from last exam), normoactive bowel sounds, no guarding or rebound tenderness  Extremities: trace to +1 non-pitting edema (improved from yesterday), no LE tenderness Neurologic: alert & oriented X3, cranial nerves II-XII intact, strength  Psych: Cooperative but demanding and angry at times during the interview with depressed/flat affect   Lab results: Basic Metabolic Panel:  Recent Labs  54/07/8101/27/15 0500 01/06/14 2025  NA 136*  137  K 4.0 5.0  CL 92* 92*  CO2 24 26  GLUCOSE 136* 107*  BUN 40* 23  CREATININE 5.44* 3.31*  CALCIUM 9.1 9.2  PHOS 6.2*  --    Liver Function Tests:  Recent Labs  01/05/14 2328 01/06/14 0500 01/06/14 2025  AST 109*  --  100*  ALT 64*  --  57*  ALKPHOS 300*  --  254*  BILITOT 2.0*  --  2.0*  PROT 8.6*  --  8.1  ALBUMIN 3.2*  2.7* 3.0*   CBC:  Recent Labs  01/05/14 2328 01/06/14 2025  WBC 11.3* 10.2  NEUTROABS 7.6  --   HGB 12.6* 11.3*  HCT 35.4* 32.3*  MCV 76.0* 78.0  PLT 99* 83*   Cardiac Enzymes:  Recent Labs  01/05/14 2328  TROPONINI <0.30   BNP:  Recent Labs  01/05/14 2328 01/06/14 2025  PROBNP >70000.0* >70000.0*     Imaging results:  Dg Chest Port 1 View  01/06/2014   CLINICAL DATA:  Dyspnea.  EXAM: PORTABLE CHEST - 1 VIEW  COMPARISON:  Chest radiograph performed 01/05/2014  FINDINGS: The lungs are well-aerated. Vascular congestion is noted. Bibasilar airspace opacification may reflect multifocal pneumonia or pulmonary edema. No pleural effusion or pneumothorax is seen.  The cardiomediastinal silhouette is mildly enlarged. No acute osseous abnormalities are identified. Minimal lucency along the left chest wall is thought to reflect the interface with the patient's left arm.  IMPRESSION: Vascular congestion and mild cardiomegaly. Bibasilar airspace opacification may reflect multifocal pneumonia or mild pulmonary edema.   Electronically Signed   By: Roanna Raider M.D.   On: 01/06/2014 23:35   Dg Chest Portable 1 View  01/05/2014   CLINICAL DATA:  58 year old male with shortness of breath. History of COPD and end-stage renal disease.  EXAM: PORTABLE CHEST - 1 VIEW  COMPARISON:  12/30/2013 and prior chest radiographs  FINDINGS: Cardiomegaly and interstitial pulmonary edema noted.  There is no evidence of focal airspace disease, suspicious pulmonary nodule/mass, pleural effusion, or pneumothorax. No acute bony abnormalities are identified. Is  IMPRESSION:  Cardiomegaly with interstitial pulmonary edema.   Electronically Signed   By: Laveda Abbe M.D.   On: 01/05/2014 23:29    Other results: EKG: none available   Assessment: 58 year old with ESRD on HD, decompensated liver cirrhosis, COPD, HTN, and AFib (not on coumadin), and PTSD recently discharged a few hours ago, comes back for worsening mood requesting inpatient [facility] treatment.  Plan:  PTSD/Bipolar Disorder  - Per [facility] records obtained from 10[facility]2013 pt with pmh of PTSD on prazosin and trazodone and bipolar disorder (previously on aripiprazole) currently not able to afford any outpatient medications, due to financial constraints (though the patient reports making $4K/month). The patient presents seeking inpatient [facility] psychiatric care.  -Admit per ED request -Consulted psychiatry to see pt in AM -Will call VA in AM to see about possibility of transfer -Continue prazosin 4 mg daily for nightmares  -Continue trazadone 50 mg daily at bedtime   ESRD - Pt on TTS schedule at [facility] Pt recently had partial HD sessions 2/26 (transportation problems) and 2/27 (demanded HD to stop due to cramping), scheduled for his next HD 2/28. Pt with mild volume overload on exam improved from 1 day ago. Wt appears near baseline (56 kg). Pro-BNP chronically elevated at >70K. Last 2D- echo on 06/14/13 with normal EF (55%) -Renal diet  -Nephrology consult in AM for HD in AM as scheduled -Continue renavite daily and phoslo 1334 TID with meals -Monitor daily weight and volume status    COPD - Pt currently with no acute exacerbation.  Pt not on home oxygen, rescue or daily maintenance inhalers. Reports having PFT's at [place] in [place] and told he should be on home oxygen -Albuterol nebulizer Q6hr PRN -Spiriva daily for maintenance therapy   Recent HCAP - Pt was recently hospitalized and treated for HCAP from 2/11-2/15 and was to take Levaquin 250 mg Q 48 hr for 14 days however not able to  afford medication. Pt  then hospitalized 2/20-2/23 and started on Augmentin which received for 3 days and to complete 7 day course. Chest xray on 2/27 unchanged with no leukocytosis, fever, productive cough, or dyspnea.    -Mucinex 600 mg BID for cough -Tessalon 100 mg PRN BID for cough   Decompensated Liver Cirrhosis - Pt with liver function testing on admission that appears at baseline. PTT elevated at 41 with no active bleeding. Etiology most likely due to chronic hepatitis C infection. No fever, encephalopathy, significant leukocytosis, or worsening ascites to suggest SBP or warrant paracentesis. -Continue lactulose 10g TID -Continue hydroxyzine 25 mg Q 6hr PRN pruritis  -Continue folic acid 1 mg daily -Phenergan PRN nausea (avoid zofran in setting of prolonged QT)   Hypocalcemia in setting of Secondary Hyperparathyroidism - Pt with corrected calcium of 10 on admission. Etiology most likely secondary due to ESRD.  -Obtain renal function panel in AM -Continue phoslo TID with meals  -Continue weekly hectoral with HD   Chronic prolonged QT - Pt with QTc of 643 on 12/30/13. Etiology likely due to medications vs chronic hypocalcemia in setting of secondary hyperparathyroidism.  -Avoid QT prolonging medications    Chronic Anion Gap Metabolic Acidosis- Pt with AG of 19 on admission (20 on discharge). Etiology most likely chronic due to hyperphosphatemia in setting of ESRD vs starvation ketoacidosis (poor PO intake as unable to afford food). Glucose was 107 on admission. Salicylate level and ethanol levels were normal on admission.   -Obtain renal function panel in AM  -Renal diet   Hypertension - Pt hypertensive on admission.  -Continue metoprolol 50 mg daily  -Continue amlodipine 10 mg daily   Paroxysmal Atrial Fibrillation - Currently normal HR and NSR. CHADS score of 1 (HTN) on daily AP therapy. Per [facility] records he was for a period of time on coumadin for [facility] therapy.    -Continue  81 mg aspirin daily  -Continue  metoprolol 50 mg daily  -Monitor on telemetry   Chronic Thrombocytopenia - Platlet count of 83K on admission, near baseline of 100K. Pt with no recent active bleeding or bruising.  -Monitor for bleeding  -SQ heparin for DVT prophylaxis   Chronic Microcytic Anemia -  Pt with Hg on admission of 11.3 above baseline of 10. Etiology likely due to CKD. Pt receives ESA and erythropoetin weekly at HD.  -Monitor for bleeding  -Transfuse if Hg <7  CAD - Pt with history of MI per [facility] records. Currently without CP.  -Continue 81 mg aspirin daily   Social Issues - The main reason for his admission is to get connected with the [facility] system.  I'm not sure whether that can actually be accomplished in the inpatient setting.  We will consult social work to see if the patient can be transferred, and await psych consult.  Medically, the patient is stable for transport/discharge as of now. -SW consult in AM for assistance with transfer  Diet: Renal   DVT Ppx: SQ heparin TID Code: Full  Dispo: Disposition is deferred at this time, awaiting transfer to [facility] vs discharge home.  The patient does not have a current PCP (No Pcp Per Patient) and does not need an [facility] hospital follow-up appointment after discharge.  The patient does have transportation limitations that hinder transportation to clinic appointments.  Signed:  Otis Brace, MD 01/07/2014, 12:23 AM

## 2014-01-07 NOTE — Progress Notes (Addendum)
Tolerating po fluids without difficulty.  Client is emaciated in appearance.  Dr. Arta Silence in to see client and has written orders for dialysis.  1346 Report given to nurse prior to transport.

## 2014-01-07 NOTE — Progress Notes (Signed)
INITIAL NUTRITION ASSESSMENT  DOCUMENTATION CODES Per approved criteria  -Severe malnutrition in the context of chronic illness   INTERVENTION: Nepro Shake po TID, each supplement provides 425 kcal and 19 grams protein  NUTRITION DIAGNOSIS: Malnutrition related to chronic illness as evidenced by severe muscle wasting and 8% weight loss x 1 month.   Goal: Pt to meet >/= 90% of their estimated nutrition needs   Monitor:  PO intake, weight trend, labs   Reason for Assessment: Pt identified as at nutrition risk on the Malnutrition Screen Tool  58 y.o. male  Admitting Dx: <principal problem not specified>  ASSESSMENT: Carl Benson is a 58 year old Veteran with past medical history of ESRD on TTS HD, COPD, HTN, decompensated liver cirrhosis, and afib (not on coumadin) who presents with depression. The patient was recently hospitalized 2/26-2/27 for an admission for shortness of breath, due to missing part of an HD session 2/26. He went home on 2/27, but upon arrival home, he states "everything went hell", with worsening of his PTSD as he is on no medcations. He reports depression and delusions with no suicidal ideation. He states that he felt he needed [place] get [place].  [facility]  Pt in room with lunch in front of him uneaten. Per pt the food we have is bad for him and he is not going to eat it, states that it lacks taste. Pt told me when I walked in that he did not need me. Able to ask a few questions. Reports he has lost a lot of weight (8% x 1 month), he likes ensure. Pt then said he was done talking with me.  Unable to complete physical exam however gown not covering shoulders and clavicles very prominent.   Height: Ht Readings from Last 1 Encounters:  01/06/14 5\' 5"  (1.651 m)    Weight: Wt Readings from Last 1 Encounters:  01/07/14 120 lb 14.4 oz (54.84 kg)    Ideal Body Weight: 56.8 kg   % Ideal Body Weight:  96%  Wt Readings from Last 10 Encounters:  01/07/14 120 lb 14.4 oz (54.84 kg)  01/06/14 121 lb 0.5 oz (54.9 kg)  01/02/14 118 lb (53.524 kg)  12/25/13 121 lb 14.6 oz (55.3 kg)  12/19/13 140 lb (63.504 kg)  12/12/13 139 lb 8.8 oz (63.3 kg)  12/04/13 138 lb (62.596 kg)  11/29/13 138 lb 14.2 oz (63 kg)  11/21/13 122 lb 6.4 oz (55.52 kg)  11/17/13 130 lb 8.2 oz (59.2 kg)    Usual Body Weight: 130 lb per pt   % Usual Body Weight: 92%  BMI:  Body mass index is 20.12 kg/(m^2).  Estimated Nutritional Needs: Kcal: 1900-2100 Protein: 75-85 grams Fluid: 1.2 L/day  Skin: no issues noted  Diet Order: Renal  EDUCATION NEEDS: -No education needs identified at this time   Intake/Output Summary (Last 24 hours) at 01/07/14 1059 Last data filed at 01/07/14 0500  Gross per 24 hour  Intake    240 ml  Output      0 ml  Net    240 ml    Last BM: 2/27   Labs:   Recent Labs Lab 01/02/14 0650  01/06/14 0500 01/06/14 2025 01/07/14 0425  NA 139  < > 136* 137 135*  K 3.5*  < > 4.0 5.0 4.5  CL 97  < > 92* 92* 90*  CO2 25  < > 24 26 25  BUN 50*  < > 40* 23 31*  CREATININE 5.67*  < > 5.44* 3.31* 3.85*  CALCIUM 9.0  < > 9.1 9.2 9.2  PHOS 7.0*  --  6.2*  --  5.0*  GLUCOSE 124*  < > 136* 107* 148*  < > = values in this interval not displayed.  CBG (last 3)  No results found for this basename: GLUCAP,  in the last 72 hours  Scheduled Meds: . amLODipine  5 mg Oral QHS  . aspirin EC  81 mg Oral Daily  . calcium acetate  1,334 mg Oral TID WC  . folic acid  1 mg Oral Daily  . guaiFENesin  600 mg Oral BID  . heparin  5,000 Units Subcutaneous 3 times per day  . lactulose  10 g Oral TID  . metoprolol  12.5 mg Oral BID  . sodium chloride  3 mL Intravenous Q12H  . tiotropium  18 mcg Inhalation Daily  . traZODone  50 mg Oral QHS    Continuous Infusions:   Past Medical History  Diagnosis Date  . COPD (chronic obstructive pulmonary disease)   . Hypertension   . Hep C w/ coma,  chronic   . Irregular heartbeat   . ESRD (end stage renal disease) on dialysis     /notes 11/11/2013  . Smoker unmotivated to quit   . Active smoker   . PTSD (post-traumatic stress disorder)   . Shortness of breath     Past Surgical History  Procedure Laterality Date  . Paracentesis  ~ 10/2013    [name] 11/11/2013    Kendell Bane RD, LDN, CNSC [phone] Pager [phone] After Hours Pager

## 2014-01-08 NOTE — Progress Notes (Signed)
Patient alert and oriented x4.  Patient irritable, agitated, confrontational, and anxious throughout shift.  Patient has been independent with set up with self-care activities throughout this admission.  Requested help with his bath this afternoon, but refused help from male nurse tech, stating "I only let females touch me".   Patient was set up with bath supplies.  It was explained to patient that he should be bathing himself as much as he is able, as it is therapeutic and encourages independence.  Patient then stated "you're a lazy [expletive].  You are all lazy asses that refuse to help me.  I have PTSD and I'm going to go off on you and you won't know what to think.  Get out of my room."  Charge nurse informed of situation.  AC informed of situation.  Will continue to monitor.

## 2014-01-08 NOTE — Progress Notes (Signed)
[facility] Progress Note   Subjective: 3.4 kg off with stable BP yest, feels better today, abd pain is better.   Filed Vitals:   01/08/14 0517 01/08/14 1055 01/08/14 1058 01/08/14 1417  BP: 115/68  117/69 138/92  Pulse: 77  79 87  Temp: 97.9 F (36.6 C)   97 F (36.1 C)  TempSrc: Oral   Axillary  Resp: 20  18 18   Height:      Weight: 51 kg (112 lb 7 oz)     SpO2: 100% 100% 98% 100%   Exam: Alert, no distress, sitting up on side of bed No jvd Clear throughout bilat, no rales or wheezing RRR no MRG Moderate ascites, mild abd tenderness, no rebound No LE or UE edema R arm AVG patent  Dialysis: TTS [facility]  4h   F160   55.5kg   2K/2.25 Bath   400/800   Heparin 3000  Hectorol 2   Epo 2800   Venofer none   Assessment: 1 PTSD / anxiety / poor social support system- per primary 2 ESRD on hemodialysis 3 Abd pain / cirrhosis w ascites- recurrent issue, abd pain better today after volume removal 4 Afib on lowdose MTP 5 COPD 6 Chronic thrombocytopenia 7 Anemia Hb 11, hold esa 8 2HPT- cont D/phoslo 9 HTN/volume- 4 kg under dry wt, BP meds being lowered to assist with vol removal  Plan- Short HD Monday, challenge dry wt    [name] Rumi Kolodziej MD  pager [phone]    cell [phone]  01/08/2014, 4:19 PM     Recent Labs Lab 01/02/14 0650  01/06/14 0500 01/06/14 2025 01/07/14 0425  NA 139  < > 136* 137 135*  K 3.5*  < > 4.0 5.0 4.5  CL 97  < > 92* 92* 90*  CO2 25  < > 24 26 25   GLUCOSE 124*  < > 136* 107* 148*  BUN 50*  < > 40* 23 31*  CREATININE 5.67*  < > 5.44* 3.31* 3.85*  CALCIUM 9.0  < > 9.1 9.2 9.2  PHOS 7.0*  --  6.2*  --  5.0*  < > = values in this interval not displayed.  Recent Labs Lab 01/05/14 2328 01/06/14 0500 01/06/14 2025 01/07/14 0425  AST 109*  --  100*  --   ALT 64*  --  57*  --   ALKPHOS 300*  --  254*  --   BILITOT 2.0*  --  2.0*  --   PROT 8.6*  --  8.1  --   ALBUMIN 3.2* 2.7* 3.0* 2.8*    Recent Labs Lab  01/05/14 2328 01/06/14 2025 01/07/14 1426  WBC 11.3* 10.2 10.6*  NEUTROABS 7.6  --   --   HGB 12.6* 11.3* 11.3*  HCT 35.4* 32.3* 32.1*  MCV 76.0* 78.0 76.8*  PLT 99* 83* 122*   . amLODipine  5 mg Oral QHS  . aspirin EC  81 mg Oral Daily  . calcium acetate  1,334 mg Oral TID WC  . feeding supplement (NEPRO CARB STEADY)  237 mL Oral TID WC  . folic acid  1 mg Oral Daily  . guaiFENesin  600 mg Oral BID  . heparin  5,000 Units Subcutaneous 3 times per day  . lactulose  10 g Oral TID  . metoprolol  12.5 mg Oral BID  . QUEtiapine  50 mg Oral QHS  . sertraline  25 mg Oral Daily  . sodium chloride  3 mL Intravenous Q12H  .  tiotropium  18 mcg Inhalation Daily  . traZODone  50 mg Oral QHS     albuterol, benzonatate, hydrOXYzine, promethazine

## 2014-01-08 NOTE — Progress Notes (Signed)
Subjective: He still feels depressed and anxious, denies SI/HI.   Objective: Vital signs in last 24 hours: Filed Vitals:   01/07/14 2153 01/08/14 0517 01/08/14 1055 01/08/14 1058  BP: 127/83 115/68  117/69  Pulse: 80 77  79  Temp: 97.7 F (36.5 C) 97.9 F (36.6 C)    TempSrc: Oral Oral    Resp: 22 20  18   Height:      Weight:  112 lb 7 oz (51 kg)    SpO2: 100% 100% 100% 98%   Weight change: -2 lb 10.6 oz (-1.209 kg)  Intake/Output Summary (Last 24 hours) at 01/08/14 1414 Last data filed at 01/08/14 1316  Gross per 24 hour  Intake   1023 ml  Output   3414 ml  Net  -2391 ml   Physical Exam  Constitutional: He is oriented to person, place, and time.  Thin AA man, resting in bed, in NAD  Cardiovascular: Normal rate and regular rhythm.   Pulmonary/Chest: Effort normal. No respiratory distress. He has no wheezes.  Abdominal: Soft. There is no tenderness.  Musculoskeletal: He exhibits no edema.  Neurological: He is alert and oriented to person, place, and time.  Skin: Skin is warm and dry.  Psychiatric:  Mood is "depressed and anxious"    Lab Results: Basic Metabolic Panel:  Recent Labs Lab 01/06/14 0500 01/06/14 2025 01/07/14 0425  NA 136* 137 135*  K 4.0 5.0 4.5  CL 92* 92* 90*  CO2 24 26 25   GLUCOSE 136* 107* 148*  BUN 40* 23 31*  CREATININE 5.44* 3.31* 3.85*  CALCIUM 9.1 9.2 9.2  PHOS 6.2*  --  5.0*   Liver Function Tests:  Recent Labs Lab 01/05/14 2328  01/06/14 2025 01/07/14 0425  AST 109*  --  100*  --   ALT 64*  --  57*  --   ALKPHOS 300*  --  254*  --   BILITOT 2.0*  --  2.0*  --   PROT 8.6*  --  8.1  --   ALBUMIN 3.2*  < > 3.0* 2.8*  < > = values in this interval not displayed. No results found for this basename: LIPASE, AMYLASE,  in the last 168 hours  Recent Labs Lab 01/06/14 2310  AMMONIA 39   CBC:  Recent Labs Lab 01/05/14 2328 01/06/14 2025 01/07/14 1426  WBC 11.3* 10.2 10.6*  NEUTROABS 7.6  --   --   HGB 12.6* 11.3*  11.3*  HCT 35.4* 32.3* 32.1*  MCV 76.0* 78.0 76.8*  PLT 99* 83* 122*   Cardiac Enzymes:  Recent Labs Lab 01/05/14 2328  TROPONINI <0.30   BNP:  Recent Labs Lab 01/05/14 2328 01/06/14 2025  PROBNP >70000.0* >70000.0*    CBG:  Recent Labs Lab 01/02/14 0800  GLUCAP 112*   Coagulation:  Recent Labs Lab 01/07/14 0030  LABPROT 13.6  INR 1.06     Alcohol Level:  Recent Labs Lab 01/06/14 2025  ETH <11    Micro Results: Recent Results (from the past 240 hour(s))  MRSA PCR SCREENING     Status: None   Collection Time    12/31/13  3:03 AM      Result Value Ref Range Status   MRSA by PCR NEGATIVE  NEGATIVE Final   Comment:            The GeneXpert MRSA Assay (FDA     approved for NASAL specimens     only), is one component of a  comprehensive MRSA colonization     surveillance program. It is not     intended to diagnose MRSA     infection nor to guide or     monitor treatment for     MRSA infections.   Studies/Results: Dg Chest Port 1 View  01/06/2014   CLINICAL DATA:  Dyspnea.  EXAM: PORTABLE CHEST - 1 VIEW  COMPARISON:  Chest radiograph performed 01/05/2014  FINDINGS: The lungs are well-aerated. Vascular congestion is noted. Bibasilar airspace opacification may reflect multifocal pneumonia or pulmonary edema. No pleural effusion or pneumothorax is seen.  The cardiomediastinal silhouette is mildly enlarged. No acute osseous abnormalities are identified. Minimal lucency along the left chest wall is thought to reflect the interface with the patient's left arm.  IMPRESSION: Vascular congestion and mild cardiomegaly. Bibasilar airspace opacification may reflect multifocal pneumonia or mild pulmonary edema.   Electronically Signed   By: Roanna Raider M.D.   On: 01/06/2014 23:35   Medications: I have reviewed the patient's current medications. Scheduled Meds: . amLODipine  5 mg Oral QHS  . aspirin EC  81 mg Oral Daily  . calcium acetate  1,334 mg Oral TID WC    . feeding supplement (NEPRO CARB STEADY)  237 mL Oral TID WC  . folic acid  1 mg Oral Daily  . guaiFENesin  600 mg Oral BID  . heparin  5,000 Units Subcutaneous 3 times per day  . lactulose  10 g Oral TID  . metoprolol  12.5 mg Oral BID  . QUEtiapine  50 mg Oral QHS  . sertraline  25 mg Oral Daily  . sodium chloride  3 mL Intravenous Q12H  . tiotropium  18 mcg Inhalation Daily  . traZODone  50 mg Oral QHS   Continuous Infusions:  PRN Meds:.albuterol, benzonatate, hydrOXYzine, promethazine Assessment/Plan: 58 year old with ESRD on HD, decompensated liver cirrhosis, COPD, HTN, and AFib (not on coumadin), and PTSD recently discharged a few hours ago, comes back for worsening mood requesting inpatient [facility] psych treatment.  PTSD/Bipolar Disorder - Stable at this time with no SI/HI or mania. Psychiatry following with new medication recommendations. Paperwork for [facility] faxed on 2/28 with no decision yet made in regards to transfer acceptance.  -Per psychiatry prazosin 4 mg daily for nightmares discontinued -Continue Trazadone 50mg  daily qHS -Per psychiatry, started on sertraline 25mg  daily, and seroquel 50mg  qHS -SW consulted for assistance with [facility] transfer, appreciate help  ESRD - HD on TTS. Nephrology following with HD per regular schedule. No signs of volume overload on physical exam.   Recent HCAP - Partially treated with Levaquin due to medication noncompliance. Remains afebrile with no productive cough or fever. Very mild leucocytosis at 10.6 (nl level up to 10.5). -No antibiotics for now, continue monitoring -Mucinex 600mg  BID for cough -Tessalon 100mg  BID PRN cough  Decompensated Liver Cirrhosis - Stable -Continue lactulose 10g TID, hydroxyzine PRN for pruritis, folic acid  HTN - BP stable at 115/68 this morning.  -Continue home metoprolol 50mg  daily and Norvasc 10mg  daily  Chronic thrombocytopenia - No signs or symptoms of bleeding. BL platelets in ~80s.  -Check CBC in  AM  Microcytic anemia - Likely 2/2 anemia of chronic disease v micronutrient deficiency due to poor diet.  -Ordered anemia panel   Calorie protein malnutrition - Patient is very thin, with albumin of 2.8. This is likely a combination of liver disease and poor caloric intake.  -Continue nepro card supplements -Dietitian consult  History of Paroxysmal A. Fib-  CHADS2 score of 1. Remains in NSR. Was on coumadin in the past per VA's records but remains on metoprolol and ASA only.   COPD - Stable.  -Continue Albuterol neb q6hr PRN  Social Issues - Patient is very interested in establishing care at the [facility] in [place] with little to no social support currently. Appreciate SW assistance with pt's transfer to [facility] (paperwork faxed on 2/28)   Dispo: Disposition is deferred at this time, awaiting improvement of current medical problems.  Anticipated discharge in approximately 1-2 day(s).   The patient does not have a current PCP (No Pcp Per Patient) and [facility] not [facility] after discharge--he wishes to be seen at the [facility] in [place].   The patient does have transportation limitations that hinder transportation to clinic appointments.  .Services Needed at time of discharge: Y = Yes, Blank = No PT:   OT:   RN:   Equipment:   Other:     LOS: 2 days   Ky BarbanSolianny D Alexzia Kasler, MD 01/08/2014, 2:14 PM

## 2014-01-09 DIAGNOSIS — F332 Major depressive disorder, recurrent severe without psychotic features: Secondary | ICD-10-CM

## 2014-01-09 DIAGNOSIS — Z992 Dependence on renal dialysis: Secondary | ICD-10-CM

## 2014-01-09 DIAGNOSIS — E41 Nutritional marasmus: Secondary | ICD-10-CM

## 2014-01-09 DIAGNOSIS — D509 Iron deficiency anemia, unspecified: Secondary | ICD-10-CM

## 2014-01-09 DIAGNOSIS — E871 Hypo-osmolality and hyponatremia: Secondary | ICD-10-CM

## 2014-01-09 DIAGNOSIS — I10 Essential (primary) hypertension: Secondary | ICD-10-CM

## 2014-01-09 DIAGNOSIS — N2581 Secondary hyperparathyroidism of renal origin: Secondary | ICD-10-CM

## 2014-01-09 LAB — CBC
HEMATOCRIT: 29.7 % — AB (ref 39.0–52.0)
HEMOGLOBIN: 10.5 g/dL — AB (ref 13.0–17.0)
MCH: 26.4 pg (ref 26.0–34.0)
MCHC: 35.4 g/dL (ref 30.0–36.0)
MCV: 74.8 fL — ABNORMAL LOW (ref 78.0–100.0)
Platelets: 115 10*3/uL — ABNORMAL LOW (ref 150–400)
RBC: 3.97 MIL/uL — AB (ref 4.22–5.81)
RDW: 22.2 % — ABNORMAL HIGH (ref 11.5–15.5)
WBC: 7.6 10*3/uL (ref 4.0–10.5)

## 2014-01-09 LAB — RENAL FUNCTION PANEL
Albumin: 2.7 g/dL — ABNORMAL LOW (ref 3.5–5.2)
BUN: 45 mg/dL — ABNORMAL HIGH (ref 6–23)
CHLORIDE: 90 meq/L — AB (ref 96–112)
CO2: 24 meq/L (ref 19–32)
CREATININE: 4.61 mg/dL — AB (ref 0.50–1.35)
Calcium: 9.5 mg/dL (ref 8.4–10.5)
GFR calc Af Amer: 15 mL/min — ABNORMAL LOW (ref >90)
GFR, EST NON AFRICAN AMERICAN: 13 mL/min — AB (ref >90)
GLUCOSE: 115 mg/dL — AB (ref 70–99)
Phosphorus: 5.2 mg/dL — ABNORMAL HIGH (ref 2.3–4.6)
Potassium: 3.9 mEq/L (ref 3.7–5.3)
Sodium: 133 mEq/L — ABNORMAL LOW (ref 137–147)

## 2014-01-09 MED ORDER — DOXERCALCIFEROL 4 MCG/2ML IV SOLN
1.0000 ug | INTRAVENOUS | Status: DC
  Administered 2014-01-10: 1 ug via INTRAVENOUS
  Filled 2014-01-09: qty 2

## 2014-01-09 MED ORDER — DOXERCALCIFEROL 4 MCG/2ML IV SOLN
INTRAVENOUS | Status: AC
  Filled 2014-01-09: qty 2

## 2014-01-09 MED ORDER — BOOST / RESOURCE BREEZE PO LIQD
1.0000 | Freq: Two times a day (BID) | ORAL | Status: DC
  Administered 2014-01-09: 1 via ORAL

## 2014-01-09 MED ORDER — HYDROXYZINE HCL 25 MG PO TABS
ORAL_TABLET | ORAL | Status: AC
  Filled 2014-01-09: qty 1

## 2014-01-09 MED ORDER — ENSURE COMPLETE PO LIQD
237.0000 mL | ORAL | Status: DC
  Administered 2014-01-09: 237 mL via ORAL

## 2014-01-09 NOTE — Treatment Plan (Addendum)
Dr. Elsie Saas declines Carl Benson admission to [facility] due to his medical acuity/lack of compliance and lack of SI/HI/Psychosis.  Dr. Elsie Saas recommends referring to long-term treatment for his PTSD at [facility] or the [place].  Pt should be placed under IVC if he decides he wants to leave and not wait for hospitalization.

## 2014-01-09 NOTE — Progress Notes (Signed)
Psychiatric CSW faxed patient referral to [facility], [facility], [facility], and [facility] for review. CSW will continue to follow.  Samuella Bruin, MSW, [facility] Clinical Social Worker [phone]

## 2014-01-09 NOTE — Progress Notes (Signed)
  Date: 01/09/2014  Patient name: Carl Benson  Medical record number: [id number]  Date of birth: [birth year]   This patient has been seen and the plan of care was discussed with the house staff. Please see their note for complete details. I concur with their findings with the following additions/corrections: At this time denies SI/HI. He is medically stable. We need to discuss with Psych inpatient hospitalization, as this is their recommendation. I see no active acute medical issues at this time.  Jonah Blue, DO, FACP [facility] Internal Medicine Residency [facility] 01/09/2014, 2:30 PM

## 2014-01-09 NOTE — Progress Notes (Signed)
Carl Benson called from [facility] hospital, stated she does not know if patient will meet criteria for psyche hospital due to various medical diagnosis, but she will start the process.

## 2014-01-09 NOTE — Progress Notes (Signed)
CSW spoke with [facility] Patient Transfer Coordinator [phone] who stated that patient needs medical psychiatric treatment and that the [facility] Does not offer this service.  Samuella Bruin, MSW, [facility] Clinical Social Worker [phone]

## 2014-01-09 NOTE — Progress Notes (Signed)
At 14:30, Patient wanted to leave AMA.  Security called.  Patient willingly  returned to room.  Order for sitter obtained.  Sitter at bedside. Commitment papers on chart.

## 2014-01-09 NOTE — Progress Notes (Signed)
CSW informed that patient attempting to leave hospital. CSW faxed IVC paperwork to [facility] and confirmed receipt of fax by [employer].   Samuella Bruin, MSW, [facility] Clinical Social Worker [phone]

## 2014-01-09 NOTE — Progress Notes (Signed)
Pt at nursing station wanting to leave AMA to go to the bank, pt stated he wanted a new RN.  Security was called and care was taken over by another Charity fundraiser.

## 2014-01-09 NOTE — Progress Notes (Signed)
Pt attempted to be ornery, but able to console patient, slept throughout the night, NAD noted

## 2014-01-09 NOTE — Procedures (Signed)
Patient was seen on dialysis and the procedure was supervised.  BFR 350  Via AVG BP is  134/70.   Patient appears to be tolerating treatment well  Carl Benson A 01/09/2014

## 2014-01-09 NOTE — Progress Notes (Signed)
NUTRITION FOLLOW UP  Intervention:   Resource Breeze po BID, each supplement provides 250 kcal and 9 grams of protein Ensure Complete once daily, each supplement provides 350 kcal and 13 grams of protein Provide snack once daily Encourage PO intake  Nutrition Dx:   Malnutrition related to chronic illness as evidenced by severe muscle wasting and 8% weight loss x 1 month; ongoing, more weight loss  Goal:   Pt to meet >/= 90% of their estimated nutrition needs; unmet  Monitor:   PO intake, weight trend, labs  Assessment:   58 year old [employer] with past medical history of ESRD on TTS HD, COPD, HTN, decompensated liver cirrhosis, and afib (not on coumadin) who presents with depression. The patient was recently hospitalized 2/26-2/27 for an admission for shortness of breath, due to missing part of an HD session 2/26. He went home on 2/27, but upon arrival home, he states "everything went hell", with worsening of his PTSD as he is on no medcations. He reports depression and delusions with no suicidal ideation. He states that he felt he needed to get [facility].  [facility]  Pt states he does not like the Nepro Shakes and would prefer Ensure. RD discussed alternative supplement options with pt. Pt agreeable to drinking Raytheonesource Breeze but, still prefers Ensure. Instructed pt to drink [employer] Breeze twice daily and Ensure no more than once daily. Discussed that Nepro Shakes are the best supplement while on HD, encouraged pt to continue trying it. Pt reports ongoing poor appetite and lack of taste.  Pt's weight dropped 7 lbs since admission. Pt states he feels better after HD but, feels weak. Encouraged pt to eat at each meal.   Height: Ht Readings from Last 1 Encounters:  01/06/14 5\' 5"  (1.651 m)    Weight Status:   Wt Readings from Last 1 Encounters:  01/09/14 113 lb 12.1 oz (51.6 kg)    Re-estimated needs:  Kcal: 1900-2100   Protein: 75-85 grams  Fluid: 1.2 L/day   Skin: non-pitting RLE and LLE edema  Diet Order: Renal   Intake/Output Summary (Last 24 hours) at 01/09/14 1257 Last data filed at 01/09/14 1020  Gross per 24 hour  Intake   1620 ml  Output   1829 ml  Net   -209 ml    Last BM: 3/1   Labs:   Recent Labs Lab 01/06/14 0500 01/06/14 2025 01/07/14 0425 01/09/14 0325  NA 136* 137 135* 133*  K 4.0 5.0 4.5 3.9  CL 92* 92* 90* 90*  CO2 24 26 25 24   BUN 40* 23 31* 45*  CREATININE 5.44* 3.31* 3.85* 4.61*  CALCIUM 9.1 9.2 9.2 9.5  PHOS 6.2*  --  5.0* 5.2*  GLUCOSE 136* 107* 148* 115*    CBG (last 3)  No results found for this basename: GLUCAP,  in the last 72 hours  Scheduled Meds: . aspirin EC  81 mg Oral Daily  . calcium acetate  1,334 mg Oral TID WC  . [START ON 01/10/2014] doxercalciferol  1 mcg Intravenous Q T,Th,Sa-HD  . feeding supplement (ENSURE COMPLETE)  237 mL Oral Q24H  . feeding supplement (RESOURCE BREEZE)  1 Container Oral BID  . folic acid  1 mg Oral Daily  . guaiFENesin  600 mg Oral BID  . heparin  5,000 Units Subcutaneous 3 times per day  . hydrOXYzine      . lactulose  10 g Oral TID  .  metoprolol  12.5 mg Oral BID  . QUEtiapine  50 mg Oral QHS  . sertraline  25 mg Oral Daily  . sodium chloride  3 mL Intravenous Q12H  . tiotropium  18 mcg Inhalation Daily  . traZODone  50 mg Oral QHS    Continuous Infusions:   Ian Malkin RD, LDN Inpatient Clinical Dietitian Pager: [phone] After Hours Pager: [phone]

## 2014-01-09 NOTE — Progress Notes (Signed)
[facility] Progress Note   Subjective: Seen on HD, c/o itching but looks comfortable   Filed Vitals:   01/09/14 0710 01/09/14 0729 01/09/14 0800 01/09/14 0830  BP: 143/80 144/84 122/69 134/70  Pulse: 77 78 77 74  Temp:      TempSrc:      Resp:      Height:      Weight:      SpO2:       Exam: Alert, no distress, seen on HD No jvd Clear throughout bilat, no rales or wheezing RRR no MRG Moderate ascites, mild abd tenderness, no rebound No LE or UE edema R arm AVG patent  Dialysis: TTS [facility]  4h   F160   55.5kg   2K/2.25 Bath   400/800   Heparin 3000  Hectorol 2   Epo 2800   Venofer none   Assessment: 1 PTSD / anxiety / poor social support system- per primary- big issue- now [place] is saying that they might not be able to help 2 ESRD on hemodialysis- normally TTS- short treatment today but will need back on schedule tomorrow  3 Abd pain / cirrhosis w ascites- recurrent issue, abd pain better today after volume removal 4 Afib on lowdose MTP 5 COPD 6 Chronic thrombocytopenia 7 Anemia Hb 11, now 10.5 , resume esa 8 2HPT- cont vitamin D/phoslo- last PTH 618 9 HTN/volume- 4 kg under dry wt, BP meds being lowered to assist with vol removal- only on lopressor currently  10. Malnutrition- big issue - on supp    Tiwatope Emmitt A   01/09/2014, 8:46 AM     Recent Labs Lab 01/06/14 0500 01/06/14 2025 01/07/14 0425 01/09/14 0325  NA 136* 137 135* 133*  K 4.0 5.0 4.5 3.9  CL 92* 92* 90* 90*  CO2 24 26 25 24   GLUCOSE 136* 107* 148* 115*  BUN 40* 23 31* 45*  CREATININE 5.44* 3.31* 3.85* 4.61*  CALCIUM 9.1 9.2 9.2 9.5  PHOS 6.2*  --  5.0* 5.2*    Recent Labs Lab 01/05/14 2328  01/06/14 2025 01/07/14 0425 01/09/14 0325  AST 109*  --  100*  --   --   ALT 64*  --  57*  --   --   ALKPHOS 300*  --  254*  --   --   BILITOT 2.0*  --  2.0*  --   --   PROT 8.6*  --  8.1  --   --   ALBUMIN 3.2*  < > 3.0* 2.8* 2.7*  < > = values in this interval  not displayed.  Recent Labs Lab 01/05/14 2328 01/06/14 2025 01/07/14 1426 01/09/14 0325  WBC 11.3* 10.2 10.6* 7.6  NEUTROABS 7.6  --   --   --   HGB 12.6* 11.3* 11.3* 10.5*  HCT 35.4* 32.3* 32.1* 29.7*  MCV 76.0* 78.0 76.8* 74.8*  PLT 99* 83* 122* 115*   . aspirin EC  81 mg Oral Daily  . calcium acetate  1,334 mg Oral TID WC  . feeding supplement (NEPRO CARB STEADY)  237 mL Oral TID WC  . folic acid  1 mg Oral Daily  . guaiFENesin  600 mg Oral BID  . heparin  5,000 Units Subcutaneous 3 times per day  . hydrOXYzine      . lactulose  10 g Oral TID  . metoprolol  12.5 mg Oral BID  . QUEtiapine  50 mg Oral QHS  . sertraline  25 mg Oral  Daily  . sodium chloride  3 mL Intravenous Q12H  . tiotropium  18 mcg Inhalation Daily  . traZODone  50 mg Oral QHS     albuterol, benzonatate, hydrOXYzine, promethazine
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
## 2014-01-09 NOTE — Progress Notes (Addendum)
Subjective:  Pt seen and examined in AM. No acute events overnight. Pt tolerated HD well. Currently requesting to leave to go to the bank, however agreeable to stay until placement for inpatient psychiatry is found. Denies any complaints at this time. Pt still depressed however denies thoughts of harming himself or others.    Objective: Vital signs in last 24 hours: Filed Vitals:   01/09/14 0800 01/09/14 0830 01/09/14 0854 01/09/14 0910  BP: 122/69 134/70 130/75 133/74  Pulse: 77 74 73 75  Temp:    98.1 F (36.7 C)  TempSrc:    Tympanic  Resp:    16  Height:      Weight:    51.6 kg (113 lb 12.1 oz)  SpO2:    99%   Weight change: -2.3 kg (-5 lb 1.1 oz)  Intake/Output Summary (Last 24 hours) at 01/09/14 1053 Last data filed at 01/09/14 1020  Gross per 24 hour  Intake   1623 ml  Output   1829 ml  Net   -206 ml   General: alert, NAD, thin appearing  HEENT:  EOMI Neck: supple, no JVD Lungs: clear to auscultation bilaterally, decreased breath sounds at bases, no wheezing  Heart: regular rate and rhythm, no murmurs, gallops, or rubs Abdomen: soft, no tenderness to palpation, mild abdominal distension, normoactive bowel sounds, no guarding or rebound tenderness  Extremities: no LE edema  Neurologic: alert & oriented X3, cranial nerves II-XII intact, strength  Psych: Raising voice and angry at times during the interview with depressed/flat affect    Lab Results: Basic Metabolic Panel:  Recent Labs Lab 01/07/14 0425 01/09/14 0325  NA 135* 133*  K 4.5 3.9  CL 90* 90*  CO2 25 24  GLUCOSE 148* 115*  BUN 31* 45*  CREATININE 3.85* 4.61*  CALCIUM 9.2 9.5  PHOS 5.0* 5.2*   Liver Function Tests:  Recent Labs Lab 01/05/14 2328  01/06/14 2025 01/07/14 0425 01/09/14 0325  AST 109*  --  100*  --   --   ALT 64*  --  57*  --   --   ALKPHOS 300*  --  254*  --   --   BILITOT 2.0*  --  2.0*  --   --   PROT 8.6*  --  8.1  --   --   ALBUMIN 3.2*  < > 3.0* 2.8* 2.7*  < > =  values in this interval not displayed. No results found for this basename: LIPASE, AMYLASE,  in the last 168 hours  Recent Labs Lab 01/06/14 2310  AMMONIA 39   CBC:  Recent Labs Lab 01/05/14 2328  01/07/14 1426 01/09/14 0325  WBC 11.3*  < > 10.6* 7.6  NEUTROABS 7.6  --   --   --   HGB 12.6*  < > 11.3* 10.5*  HCT 35.4*  < > 32.1* 29.7*  MCV 76.0*  < > 76.8* 74.8*  PLT 99*  < > 122* 115*  < > = values in this interval not displayed. Cardiac Enzymes:  Recent Labs Lab 01/05/14 2328  TROPONINI <0.30   BNP:  Recent Labs Lab 01/05/14 2328 01/06/14 2025  PROBNP >70000.0* >70000.0*   Coagulation:  Recent Labs Lab 01/07/14 0030  LABPROT 13.6  INR 1.06     Alcohol Level:  Recent Labs Lab 01/06/14 2025  ETH <11    Micro Results: Recent Results (from the past 240 hour(s))  MRSA PCR SCREENING     Status: None   Collection  Time    12/31/13  3:03 AM      Result Value Ref Range Status   MRSA by PCR NEGATIVE  NEGATIVE Final   Comment:            The GeneXpert MRSA Assay (FDA     approved for NASAL specimens     only), is one component of a     comprehensive MRSA colonization     surveillance program. It is not     intended to diagnose MRSA     infection nor to guide or     monitor treatment for     MRSA infections.   Studies/Results: No results found. Medications: I have reviewed the patient's current medications. Scheduled Meds: . aspirin EC  81 mg Oral Daily  . calcium acetate  1,334 mg Oral TID WC  . [START ON 01/10/2014] doxercalciferol  1 mcg Intravenous Q T,Th,Sa-HD  . feeding supplement (NEPRO CARB STEADY)  237 mL Oral TID WC  . folic acid  1 mg Oral Daily  . guaiFENesin  600 mg Oral BID  . heparin  5,000 Units Subcutaneous 3 times per day  . hydrOXYzine      . lactulose  10 g Oral TID  . metoprolol  12.5 mg Oral BID  . QUEtiapine  50 mg Oral QHS  . sertraline  25 mg Oral Daily  . sodium chloride  3 mL Intravenous Q12H  . tiotropium  18 mcg  Inhalation Daily  . traZODone  50 mg Oral QHS   Continuous Infusions:  PRN Meds:.albuterol, benzonatate, hydrOXYzine, promethazine   Assessment: 58 year old with ESRD on HD, decompensated liver cirrhosis, COPD, HTN, and AFib (not on coumadin), and PTSD with multiple ED admissions in the past 3 months who presented on 2/28 with depressive symptoms and worsening PTSD requiring inpatient psychiatric treatment.   Plan:   Recurrent Severe Major Depression and PTSD - currently without SI or HI. Pt is  medically stable and cleared for inpatient psychiatric treatment once placement is found.   -Per psychiatric recommendation needs long-term psychiatric inpatient treatment (possibly [facility] as [place] [facility] can accept medical psychiatric patient) -Pt now placed under IVC due to multiple threats to leave AMA per psychiatry recommendations  -Continue sertraline 25 mg daily and quetiapine 50 mg daily    -Continue trazadone 50 mg daily at bedtime  -Sitter at bedside  ESRD - Pt on TTS schedule at [facility]. Pt received  HD 3/1 and shorter session today (however signed off 34 minutes early) -Renal diet  -Nephrology is following, on TTS schedule  -Continue renavite daily and phoslo 1334 TID with meals  -Monitor daily weight (112 to 113 lb)  and volume status (3.4 L yesterday & 1.8L in HD)  COPD - Pt currently with no acute exacerbation. Pt not on home oxygen, rescue or daily maintenance inhalers. Reports having PFT's at [place] in [place] and told he should be on home oxygen  -Albuterol nebulizer Q6hr PRN  -Spiriva daily for maintenance therapy   Recent HCAP - Pt was recently hospitalized and treated for HCAP from 2/11-2/15 and was to take Levaquin 250 mg Q 48 hr for 14 days however not able to afford medication. Pt then hospitalized 2/20-2/23 and started on Augmentin which received for 3 days and to complete 7 day course. Chest xray on 2/27 unchanged with no  leukocytosis, fever, productive cough, or dyspnea.  -Mucinex 600 mg BID for cough  -Tessalon 100 mg PRN BID  for cough   Decompensated Liver Cirrhosis - Pt with liver function testing on admission that appears at baseline. PTT elevated at 41 with no active bleeding. Etiology most likely due to chronic hepatitis C infection. No fever, encephalopathy, significant leukocytosis, or worsening ascites to suggest SBP or warrant paracentesis.  -Continue lactulose 10g TID  -Continue hydroxyzine 25 mg Q 6hr PRN pruritis  -Continue folic acid 1 mg daily  -Phenergan PRN nausea (avoid zofran in setting of prolonged QT)   Hyperphosphatemia in setting of Secondary Hyperparathyroidism - Pt with corrected calcium of 10.3 and phosphorous 5.2 today. Etiology most likely secondary due to ESRD.  -Continue phoslo TID with meals  -Continue weekly hectoral with HD   Severe Malnutrition - Pt with chronic hypoalbuminemia in setting of ESRD and liver cirrhosis. Pt with evidence of  severe muscle wasting and 8% weight loss in last month with BMI of 20.12 -Renal diet -Nepro Shake po TID  Chronic Hypervolemic Hyponatremia - Pt with chronic hyponatremia in setting of volume overload due to liver cirrhosis and ESRD.  -Continue HD on TTS schedule  Chronic Prolonged QT - Pt with QTc of 643 on 12/30/13. Etiology likely due to medications vs chronic hypocalcemia in setting of secondary hyperparathyroidism.  -Avoid QT prolonging medications   Chronic Anion Gap Metabolic Acidosis- Pt with AG of 19 (also on admission). Etiology most likely chronic due to hyperphosphatemia in setting of ESRD vs starvation ketoacidosis (poor PO intake as unable to afford food).   -Obtain renal function panel in AM  -Renal diet   Hypertension - Currently normotensive.    -Continue metoprolol tartrate 12.5 mg BID per nephrology recs (previously on metoprolol 50 mg daily)  -Holding amlodipine 10 mg daily per nephrology recs  Paroxysmal Atrial  Fibrillation - Currently normal HR and NSR. CHADS score of 1 (HTN) on daily AP therapy. [facility] period of [facility].  -Continue 81 mg aspirin daily  -Continue metoprolol tartrate 12.5 mg BID per nephrology recs (previously on metoprolol 50 mg daily)  -Monitor on telemetry   Chronic Thrombocytopenia - Platlet count of 115K above baseline of 100K. Pt with no recent active bleeding or bruising.  -Monitor for bleeding  -SQ heparin for DVT prophylaxis   Chronic Microcytic Anemia - Pt with Hg of 10.5 near baseline of 10. Etiology likely due to CKD. Pt receives ESA and erythropoetin weekly at HD.  -Monitor for bleeding  -Transfuse if Hg <7   [facility] - [facility] Currently without CP.  -Continue 81 mg aspirin daily   Social Issues - Currently living at extended stay hotel and unable to afford any of his medications, however reports he will receive [facility] check March 1st. [place] [facility] [place].  -Psych and inpatient SW following   Diet: Renal  DVT Ppx: SQ heparin TID  Code: Full   Dispo:Awaiting psychiatric inpatient placement  The patient does not have a current PCP (No Pcp Per Patient) and [facility] after discharge.  The patient does have transportation limitations that hinder transportation to clinic appointments.  .Services Needed at time of discharge: Y = Yes, Blank = No PT:   OT:   RN:   Equipment:   Other:     LOS: 3 days   Otis BraceMarjan Ruthmary Occhipinti, MD 01/09/2014, 10:53 AM

## 2014-01-09 NOTE — Progress Notes (Signed)
Hemodialysis- Pt signed off treatment early AMA with 34 minutes remaining. MD notified. Pt states he is leaving the hospital today. VS stable. 1.8L UF. Returned to room in stable condition. Report called to RN on 3E
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
## 2014-01-10 LAB — RENAL FUNCTION PANEL
Albumin: 2.3 g/dL — ABNORMAL LOW (ref 3.5–5.2)
BUN: 40 mg/dL — AB (ref 6–23)
CO2: 24 meq/L (ref 19–32)
Calcium: 8.9 mg/dL (ref 8.4–10.5)
Chloride: 95 mEq/L — ABNORMAL LOW (ref 96–112)
Creatinine, Ser: 4.58 mg/dL — ABNORMAL HIGH (ref 0.50–1.35)
GFR calc non Af Amer: 13 mL/min — ABNORMAL LOW (ref >90)
GFR, EST AFRICAN AMERICAN: 15 mL/min — AB (ref >90)
Glucose, Bld: 80 mg/dL (ref 70–99)
POTASSIUM: 4.2 meq/L (ref 3.7–5.3)
Phosphorus: 4.2 mg/dL (ref 2.3–4.6)
Sodium: 137 mEq/L (ref 137–147)

## 2014-01-10 MED ORDER — DOXERCALCIFEROL 4 MCG/2ML IV SOLN
INTRAVENOUS | Status: AC
  Filled 2014-01-10: qty 2

## 2014-01-10 NOTE — Progress Notes (Signed)
Patient ID: Carl Benson, male   DOB: [birth year], 58 y.o.   MRN: [id number]  Patient was seen for reassessment and he has been doing well and he has completed his scheduled dialysis and has been little tired. He has been seeping and eating fine. He has great mood and constricted affect. He has no suicidal or homicidal ideations, intention and plans.  This case discussed with teaching attending physician, resident and psychiatric clinical social worker. Patient is appropriate to be discharged to out patient psychiatric care at [facility] clinic. He has scheduled appointment on Friday which was confirmed by social service.   Discontinue safety sitter He was psychiatrically cleared Rescind involuntary commitment Discharge when medically cleared to out patient psychiatric care. May receive two weeks supply of his sertaline and seroqule if needed.   Carl Benson,JANARDHAHA R. 01/10/2014 3:55 PM

## 2014-01-10 NOTE — Clinical Social Work Psych Note (Addendum)
2:28pm- Psych CSW reviewed disposition with MD and Consulting civil engineer at [facility].  MD suspects that pt is at baseline and reports that pt has an OP f/u visit at the [place] on Friday where pt is agreeable to f/u.  Pt is no longer aggressive or agitated.  MD encouraged to complete a peer-to-peer with psychiatrist to determine appropriate disposition.  MD agreeable.  CSW Director aware.  10:41am- Psych CSW is reviewing disposition options with Chief Executive Officer.  Psych CSW continuing to seek inpatient psych placement.   Barrier: HD needs  Vickii Penna, LCSWA [phone]  Clinical Social Work

## 2014-01-10 NOTE — Progress Notes (Signed)
  Date: 01/10/2014  Patient name: Carl Benson  Medical record number: [id number]  Date of birth: Dec 29, 1955   This patient has been seen and the plan of care was discussed with the house staff. Please see their note for complete details. I concur with their findings with the following additions/corrections: Patient seen on rounds this morning. He denies SI/HI. He states that he doesn't want to stay here. It is noted that IVC was placed due to danger to himself without proper medical treatment/psychosocial support. Discussed case with Dr. Elsie Saas. At this time, will continue to seek inpatient psychiatric placement. He does not meet criteria for [facility] admission. In addition, he has mental capacity to make decisions (albeit poor decisions).  From a medical standpoint, he is stable and at baseline. He is ESRD and requires outpatient HD three times a week. He will need a psych facility that can arrange this.  At this time, the final decision on IVC status and psychiatric tx will need to be determined by psychiatry. If it is determined that he can follow up as outpatient, then he can be D/C'd. If he continues to be a danger to himself, then he will require continued hospital services.   Jonah Blue, DO, FACP [facility] 01/10/2014, 2:52 PM

## 2014-01-10 NOTE — Progress Notes (Signed)
[facility] Progress Note   Subjective: Events noted- now has sitter- threaten to leave AMA- psych rec inpatient psych admission- is calm presently   Filed Vitals:   01/09/14 0910 01/09/14 1401 01/09/14 2016 01/10/14 0634  BP: 133/74 134/73 142/83 127/78  Pulse: 75 71 80 74  Temp: 98.1 F (36.7 C)  98.3 F (36.8 C) 98 F (36.7 C)  TempSrc: Tympanic  Oral Oral  Resp: 16 18 18 17   Height:      Weight: 51.6 kg (113 lb 12.1 oz)   51.347 kg (113 lb 3.2 oz)  SpO2: 99% 100% 99% 99%   Exam: Alert, no distress No jvd Clear throughout bilat, no rales or wheezing RRR no MRG Moderate ascites, mild abd tenderness, no rebound No LE or UE edema R arm AVG patent  Dialysis: TTS [facility]  4h   F160   55.5kg   2K/2.25 Bath   400/800   Heparin 3000  Hectorol 2   Epo 2800   Venofer none   Assessment: 1 PTSD / anxiety / poor social support system- per primary- biggest issue- now [facility] is saying that [facility] might not be able to help- I agree there is minimal chronic medical issues, I agree with inpatient psych admission- he can come over here for dialysis TTS 2 ESRD on hemodialysis- normally TTS via AVG- was agitated so will do HD later today.  3 Abd pain / cirrhosis w ascites- recurrent issue, abd pain better today after volume removal- asking for paracentesis- if this needs to be done, can come over here for that too weekly, every other week? 4 Afib on lowdose MTP 5 COPD 6 Chronic thrombocytopenia 7 Anemia Hb 11, now 10.5 , resume esa 8 2HPT- cont vitamin D/phoslo- last PTH 618 9 HTN/volume- has responded to volume removal-  only on lopressor currently  10. Malnutrition- big issue - on supp    Ceola Para A   01/10/2014, 9:42 AM     Recent Labs Lab 01/07/14 0425 01/09/14 0325 01/10/14 0330  NA 135* 133* 137  K 4.5 3.9 4.2  CL 90* 90* 95*  CO2 25 24 24   GLUCOSE 148* 115* 80  BUN 31* 45* 40*  CREATININE 3.85* 4.61* 4.58*  CALCIUM 9.2 9.5 8.9  PHOS 5.0*  5.2* 4.2    Recent Labs Lab 01/05/14 2328  01/06/14 2025 01/07/14 0425 01/09/14 0325 01/10/14 0330  AST 109*  --  100*  --   --   --   ALT 64*  --  57*  --   --   --   ALKPHOS 300*  --  254*  --   --   --   BILITOT 2.0*  --  2.0*  --   --   --   PROT 8.6*  --  8.1  --   --   --   ALBUMIN 3.2*  < > 3.0* 2.8* 2.7* 2.3*  < > = values in this interval not displayed.  Recent Labs Lab 01/05/14 2328 01/06/14 2025 01/07/14 1426 01/09/14 0325  WBC 11.3* 10.2 10.6* 7.6  NEUTROABS 7.6  --   --   --   HGB 12.6* 11.3* 11.3* 10.5*  HCT 35.4* 32.3* 32.1* 29.7*  MCV 76.0* 78.0 76.8* 74.8*  PLT 99* 83* 122* 115*   . aspirin EC  81 mg Oral Daily  . calcium acetate  1,334 mg Oral TID WC  . doxercalciferol  1 mcg Intravenous Q T,Th,Sa-HD  . feeding supplement (ENSURE COMPLETE)  237 mL Oral Q24H  . feeding supplement (RESOURCE BREEZE)  1 Container Oral BID  . folic acid  1 mg Oral Daily  . guaiFENesin  600 mg Oral BID  . heparin  5,000 Units Subcutaneous 3 times per day  . lactulose  10 g Oral TID  . metoprolol  12.5 mg Oral BID  . QUEtiapine  50 mg Oral QHS  . sertraline  25 mg Oral Daily  . sodium chloride  3 mL Intravenous Q12H  . tiotropium  18 mcg Inhalation Daily  . traZODone  50 mg Oral QHS     albuterol, benzonatate, hydrOXYzine, promethazine

## 2014-01-10 NOTE — Procedures (Signed)
Patient was seen on dialysis and the procedure was supervised.  BFR 400  Via AVG BP is  143/80.   Patient appears to be tolerating treatment well  Linlee Cromie A 01/10/2014

## 2014-01-10 NOTE — Progress Notes (Signed)
1635 Spoken with Dr . Rosann Auerbach . Made aware of  Recommendation of psychiatry .

## 2014-01-10 NOTE — Progress Notes (Signed)
Subjective:  Pt seen and examined in AM. No acute events overnight. Pt tolerated HD well yesterday, however signed off early. Pt reports his mood is good today and does not feel depressed or angry. He denies SI or HI or any other complaints at this time. He states he does not need a a sitter and would like to go downstairs to smoke one cigarette. He refuses nicotine patch as it has not worked in the past.     Objective: Vital signs in last 24 hours: Filed Vitals:   01/09/14 0910 01/09/14 1401 01/09/14 2016 01/10/14 0634  BP: 133/74 134/73 142/83 127/78  Pulse: 75 71 80 74  Temp: 98.1 F (36.7 C)  98.3 F (36.8 C) 98 F (36.7 C)  TempSrc: Tympanic  Oral Oral  Resp: 16 18 18 17   Height:      Weight: 51.6 kg (113 lb 12.1 oz)   51.347 kg (113 lb 3.2 oz)  SpO2: 99% 100% 99% 99%   Weight change: 0.3 kg (10.6 oz)  Intake/Output Summary (Last 24 hours) at 01/10/14 0713 Last data filed at 01/10/14 0600  Gross per 24 hour  Intake   1020 ml  Output   1829 ml  Net   -809 ml   General: alert, NAD, thin appearing  HEENT:  EOMI Neck: supple, no JVD Lungs: clear to auscultation bilaterally, improved breath sounds at bases, no wheezing  Heart: regular rate and rhythm, no murmurs, gallops, or rubs Abdomen: soft, no tenderness to palpation, mild abdominal distension, normoactive bowel sounds, no guarding or rebound tenderness  Extremities: no LE edema  Neurologic: alert & oriented X3, cranial nerves II-XII intact, strength  Psych: Mood improved today     Lab Results: Basic Metabolic Panel:  Recent Labs Lab 01/09/14 0325 01/10/14 0330  NA 133* 137  K 3.9 4.2  CL 90* 95*  CO2 24 24  GLUCOSE 115* 80  BUN 45* 40*  CREATININE 4.61* 4.58*  CALCIUM 9.5 8.9  PHOS 5.2* 4.2      Medications: I have reviewed the patient's current medications. Scheduled Meds: . aspirin EC  81 mg Oral Daily  . calcium acetate  1,334 mg Oral TID WC  . doxercalciferol  1 mcg Intravenous Q  T,Th,Sa-HD  . feeding supplement (ENSURE COMPLETE)  237 mL Oral Q24H  . feeding supplement (RESOURCE BREEZE)  1 Container Oral BID  . folic acid  1 mg Oral Daily  . guaiFENesin  600 mg Oral BID  . heparin  5,000 Units Subcutaneous 3 times per day  . lactulose  10 g Oral TID  . metoprolol  12.5 mg Oral BID  . QUEtiapine  50 mg Oral QHS  . sertraline  25 mg Oral Daily  . sodium chloride  3 mL Intravenous Q12H  . tiotropium  18 mcg Inhalation Daily  . traZODone  50 mg Oral QHS   Continuous Infusions:  PRN Meds:.albuterol, benzonatate, hydrOXYzine, promethazine   Assessment: 58 year old with ESRD on HD, decompensated liver cirrhosis, COPD, HTN, and AFib (not on coumadin), and PTSD with multiple ED admissions in the past 3 months who presented on 2/28 with depressive symptoms and worsening PTSD requiring inpatient psychiatric treatment.   Plan:   Recurrent Severe Major Depression and PTSD - currently without SI or HI. Pt is  medically stable and cleared for inpatient psychiatric treatment once placement is found.   -Per psychiatric recommendation needs long-term psychiatric inpatient treatment (possibly [facility] as [place] [facility]  can accept medical psychiatric patient) -Pt now placed under IVC due to multiple threats to leave AMA and dangerous to himself per psychiatry recommendations  -Continue sertraline 25 mg daily and quetiapine 50 mg daily     -Continue trazadone 50 mg daily at bedtime  -Sitter at bedside  ESRD - Pt on TTS schedule at [facility]. Pt received  HD 3/1 and shorter session on 3/2 (signed off 34 minutes early) -Renal diet  -Nephrology is following, on TTS schedule  -Continue renavite daily and phoslo 1334 TID with meals  -Monitor daily weight (113  To 118lb)  and volume status (1.8L out  in HD)  COPD - Pt currently with no acute exacerbation. Pt not on home oxygen, rescue or daily maintenance inhalers. Reports [facility] [place]  and told he should be on home oxygen  -Albuterol nebulizer Q6hr PRN  -Spiriva 18mcg daily for maintenance therapy   Recent HCAP - Pt was recently hospitalized and treated for HCAP from 2/11-2/15 and was to take Levaquin 250 mg Q 48 hr for 14 days however not able to afford medication. Pt then hospitalized 2/20-2/23 and started on Augmentin which received for 3 days and to complete 7 day course. Chest xray on 2/27 unchanged with no leukocytosis, fever, productive cough, or dyspnea.  -Mucinex 600 mg BID for cough  -Tessalon 100 mg PRN BID for cough   Decompensated Liver Cirrhosis - Pt with liver function testing on admission that appears at baseline. PTT elevated at 41 with no active bleeding. Etiology most likely due to chronic hepatitis C infection. No fever, encephalopathy, significant leukocytosis, or worsening ascites to suggest SBP or warrant paracentesis.  -Continue lactulose 10g TID  -Continue hydroxyzine 25 mg Q 6hr PRN pruritis  -Continue folic acid 1 mg daily  -Phenergan PRN nausea (avoid zofran in setting of prolonged QT)   Secondary Hyperparathyroidism - Pt with corrected calcium of 10.3 and phosphorous 4.2 today. Etiology most likely secondary due to ESRD.  -Continue phoslo TID with meals  -Continue weekly hectoral with HD   Severe Malnutrition - Pt with chronic hypoalbuminemia in setting of ESRD and liver cirrhosis. Pt with evidence of  severe muscle wasting and 8% weight loss in last month with BMI of 20.12 -Renal diet -Nepro Shake po TID  Chronic Hypervolemic Hyponatremia - Pt with chronic hyponatremia in setting of volume overload due to liver cirrhosis and ESRD.  -Continue HD on TTS schedule  Chronic Prolonged QT - Pt with QTc of 643 on 12/30/13. Etiology likely due to medications vs chronic hypocalcemia in setting of secondary hyperparathyroidism.  -Avoid QT prolonging medications   Chronic Anion Gap Metabolic Acidosis- Pt with AG of 18 (19 on admission). Etiology most  likely chronic due to hyperphosphatemia in setting of ESRD vs starvation ketoacidosis (poor PO intake as unable to afford food).   -Obtain renal function panel in AM  -Renal diet   Hypertension - Currently normotensive.    -Continue metoprolol tartrate 12.5 mg BID per nephrology recs (previously on metoprolol 50 mg daily)  -Holding amlodipine 10 mg daily per nephrology recs  Paroxysmal Atrial Fibrillation - Currently normal HR and NSR. CHADS score of 1 (HTN) on daily AP therapy. [facility].  -Continue 81 mg aspirin daily  -Continue metoprolol tartrate 12.5 mg BID per nephrology recs (previously on metoprolol 50 mg daily)  -Monitor on telemetry   Chronic Thrombocytopenia - Platlet count of  115K above baseline of 100K. Pt with no recent active bleeding or bruising.  -Monitor for bleeding  -SQ heparin for DVT prophylaxis   Chronic Microcytic Anemia - Pt with Hg of 10.5 near baseline of 10. Etiology likely due to CKD. Pt receives ESA and erythropoetin weekly at HD.  -Monitor for bleeding  -Transfuse if Hg <7   CAD - Pt with history of MI per [facility] records. Currently without CP.  -Continue 81 mg aspirin daily   Social Issues - Currently living at extended stay hotel and unable to afford any of his medications, however reports he will receive [facility] check March 1st. [facility] [place] on March 6.  -Psych and inpatient SW following   Diet: Renal  DVT Ppx: SQ heparin TID  Code: Full   Dispo: Awaiting psychiatric inpatient placement  The patient does not have a current PCP (No Pcp Per Patient) and does need an [facility] hospital follow-up appointment after discharge.  The patient does have transportation limitations that hinder transportation to clinic appointments.  .Services Needed at time of discharge: Y = Yes, Blank = No PT:   OT:   RN:   Equipment:   Other:     LOS: 4 days   Otis Brace, MD 01/10/2014,  7:13 AM

## 2014-01-10 NOTE — Progress Notes (Signed)
1556 Received a call from Marijean Niemann Social Worker . Claimed that  Pt, Carl Benson has been cleared by psychiatry . Can follow with his psychiatrist as out patient . Will notify his MD

## 2014-01-10 NOTE — Care Management Note (Signed)
    Page 1 of 1   01/10/2014     3:10:41 PM   CARE MANAGEMENT NOTE 01/10/2014  Patient:  Carl Benson, Carl Benson   Account Number:  [id number]  Date Initiated:  01/10/2014  Documentation initiated by:  [facility]  Subjective/Objective Assessment:   57-yo man PMHx of end-stage renal disease requiring HD, HTN, COPD, liver cirrhosis secondary to hepatitis C, depression, and PTSD who presents with the chief complaint of "Everything has went that hell". /Home alone     Action/Plan:   psychiatry to evaluate the patient to see if he requires any intervention for his PTSD.//Assist in transfer to [facility] and/or [facility]   Anticipated DC Date:  01/10/2014   Anticipated DC Plan:  ACUTE TO ACUTE TRANS  In-house referral  Clinical Social Worker      DC Planning Services  CM consult      Choice offered to / List presented to:             Status of service:   Medicare Important Message given?   (If response is "NO", the following Medicare IM given date fields will be blank) Date Medicare IM given:   Date Additional Medicare IM given:    Discharge Disposition:    Per UR Regulation:    If discussed at Long Length of Stay Meetings, dates discussed:    Comments:  01/09/14 1500 Oletta Cohn, RN, BSN, [employer] [phone] Call from [facility] social worker stating that they do not provide medical/psych treatment (pt requires HD).  Notified Dr. Johna Roles.  Will continue to follow for D/C needs.

## 2014-01-11 NOTE — Progress Notes (Signed)
Pt verbalized to leave the hospital. Explained to patient the risks of leaving the hospital against medical advise. Pt started to get argumentative and combative. Spoke to Dr Andrey Campanile re patients intention to leave with orders to let patient sign AMA papers.Pt signed AMA papers, belongings given to patient. IV taken off completely , no bleeding noted after applying gauze dsg.instructed pt to follow up on [place] .

## 2014-01-11 NOTE — Discharge Summary (Signed)
PATIENT LEFT AGAINST MEDICAL ADVICE  Name: Carl Benson MRN: [id number] DOB: 1956/10/05 58 y.o. PCP: None  Date of Admission: 01/06/2014  9:54 PM Date of Discharge: 01/11/2014 Attending Physician: Jonah Blue, DO  Discharge Diagnosis:  Primary: Recurrent Severe Major Depression and PTSD  ESRD COPD  Recent HCAP Decompensated Liver Cirrhosis  Secondary Hyperparathyroidism Severe Malnutrition  Chronic Hypervolemic Hyponatremia  Chronic Prolonged QT  Chronic Anion Gap Metabolic Acidosis Hypertension  Paroxysmal Atrial Fibrillation  Chronic Thrombocytopenia Chronic Microcytic Anemia  Coronary Artery Disease  Social Issues  Tobacco abuse  DVT Prophylaxis       Discharge Medications:   Medication List    ASK your doctor about these medications       albuterol (2.5 MG/3ML) 0.083% nebulizer solution  Commonly known as:  PROVENTIL  Take 3 mLs (2.5 mg total) by nebulization every 4 (four) hours.     amLODipine 10 MG tablet  Commonly known as:  NORVASC  Take 1 tablet (10 mg total) by mouth daily.     aspirin 81 MG EC tablet  Take 1 tablet (81 mg total) by mouth daily.     calcium acetate 667 MG capsule  Commonly known as:  PHOSLO  Take 2 capsules (1,334 mg total) by mouth 3 (three) times daily with meals.     folic acid 1 MG tablet  Commonly known as:  FOLVITE  Take 1 tablet (1 mg total) by mouth daily.     hydrOXYzine 25 MG tablet  Commonly known as:  ATARAX/VISTARIL  Take 1 tablet (25 mg total) by mouth every 6 (six) hours as needed for itching or anxiety.     lactulose 10 GM/15ML solution  Commonly known as:  CHRONULAC  Take 15 mLs (10 g total) by mouth 3 (three) times daily.     metoprolol 50 MG tablet  Commonly known as:  LOPRESSOR  Take 1 tablet (50 mg total) by mouth 2 (two) times daily.     prazosin 2 MG capsule  Commonly known as:  MINIPRESS  Take 2 capsules (4 mg total) by mouth at bedtime.     tiotropium 18 MCG inhalation capsule  Commonly known as:  SPIRIVA  Place 1 capsule (18 mcg total) into inhaler and inhale daily.     traZODone 50 MG tablet  Commonly known as:  DESYREL  Take 1 tablet (50 mg total) by mouth at bedtime.        Disposition and follow-up:   Mr.Carl Benson was discharged from [facility] in Good condition.  At the hospital follow up visit please address:  [facility]. [facility]     If able to obtain medications       Treatment for PTSD and recurrent severe depression - Pt stated he did not want take sertraline and quetiapine           2.  Labs / imaging needed at time of follow-up: none  3.  Pending labs/ test needing follow-up: none  Follow-up Appointments: [facility] [place]   Discharge Instructions: Pt [facility] with [facility] in [place] on 3/6   Consultations: Treatment Team:  Nehemiah Settle, MD Cecille Aver, MD  Procedures Performed:  Dg Chest 2 View  12/22/2013   CLINICAL  DATA:  Re-evaluate pneumonia  EXAM: CHEST  2 VIEW  COMPARISON:  DG CHEST 2 VIEW dated 12/21/2013  FINDINGS: Stable cardiomegaly. Vascular pattern normal. Left lung clear. On the right, there is expanded and more conspicuous consolidation with small new effusion. Area of consolidation measures approximately 5 cm in greatest diameter.  IMPRESSION: Right lower lobe pneumonia with small associated effusion worse when compared to the prior study.   Electronically Signed   By: Esperanza Heir M.D.   On: 12/22/2013 15:08   Dg Chest 2 View  12/21/2013   CLINICAL DATA:  Shortness of breath, cough  EXAM: CHEST  2 VIEW  COMPARISON:  December 19, 2013  FINDINGS: The heart size and mediastinal contours are stable. The heart size is enlarged. There is no pulmonary edema. Patchy consolidation of right lung base is noted. There is a small posterior pleural effusion. The visualized skeletal structures are stable.  IMPRESSION: Right lung  base pneumonia.  Cardiomegaly.   Electronically Signed   By: Sherian Rein M.D.   On: 12/21/2013 02:42   Dg Abd 1 View  12/30/2013   CLINICAL DATA:  Constipation.  Mid abdominal pain.  EXAM: ABDOMEN - 1 VIEW  COMPARISON:  US PARACENTESIS dated 12/09/2013; DG ABD ACUTE W/CHEST dated 11/20/2013  FINDINGS: Centralized mildly prominent bowel loops, predominately small bowel loops. This centralization likely related to ascites. Mild gaseous distention may reflect mild ileus. No organomegaly. No free air.  IMPRESSION: Centralized, mildly prominent small bowel loops. Suspect ascites and ileus.   Electronically Signed   By: Charlett Nose M.D.   On: 12/30/2013 04:06   Dg Chest Port 1 View  01/06/2014   CLINICAL DATA:  Dyspnea.  EXAM: PORTABLE CHEST - 1 VIEW  COMPARISON:  Chest radiograph performed 01/05/2014  FINDINGS: The lungs are well-aerated. Vascular congestion is noted. Bibasilar airspace opacification may reflect multifocal pneumonia or pulmonary edema. No pleural effusion or pneumothorax is seen.  The cardiomediastinal silhouette is mildly enlarged. No acute osseous abnormalities are identified. Minimal lucency along the left chest wall is thought to reflect the interface with the patient's left arm.  IMPRESSION: Vascular congestion and mild cardiomegaly. Bibasilar airspace opacification may reflect multifocal pneumonia or mild pulmonary edema.   Electronically Signed   By: Roanna Raider M.D.   On: 01/06/2014 23:35   Dg Chest Portable 1 View  01/05/2014   CLINICAL DATA:  58 year old male with shortness of breath. History of COPD and end-stage renal disease.  EXAM: PORTABLE CHEST - 1 VIEW  COMPARISON:  12/30/2013 and prior chest radiographs  FINDINGS: Cardiomegaly and interstitial pulmonary edema noted.  There is no evidence of focal airspace disease, suspicious pulmonary nodule/mass, pleural effusion, or pneumothorax. No acute bony abnormalities are identified. Is  IMPRESSION: Cardiomegaly with interstitial  pulmonary edema.   Electronically Signed   By: Laveda Abbe M.D.   On: 01/05/2014 23:29   Dg Chest Port 1 View  12/19/2013   CLINICAL DATA:  Shortness of breath  EXAM: PORTABLE CHEST - 1 VIEW  COMPARISON:  December 12, 2013  FINDINGS: There is no edema or consolidation. Heart is enlarged with normal pulmonary vascularity. No adenopathy. No bone lesions.  IMPRESSION: Cardiomegaly.  No edema or consolidation.   Electronically Signed   By: Bretta Bang M.D.   On: 12/19/2013 16:07   Dg Abd Acute W/chest  12/30/2013   CLINICAL DATA:  One day history of nausea and vomiting. Constipation.  EXAM: ACUTE ABDOMEN SERIES (ABDOMEN 2 VIEW & CHEST 1 VIEW)  12/30/2013 2049 hr:  COMPARISON:  DG ABDOMEN 1V dated 12/30/2013 0349 hr; DG CHEST 2 VIEW dated 12/22/2013; DG CHEST 2 VIEW dated 12/21/2013; DG CHEST 1V PORT dated 12/19/2013; DG ABD ACUTE W/CHEST dated 11/20/2013  FINDINGS: Mild gaseous distention of a solitary loop of small bowel in the left mid abdomen, with improvement in the bowel gas pattern since the examination earlier today. Gas in normal caliber colon. Scattered colonic and small bowel air-fluid levels on the decubitus image. No free intraperitoneal air. Phleboliths low in both sides of the pelvis.  Stable marked cardiomegaly. Pulmonary venous hypertension without overt edema. Scarring at the right lung base at the site of the prior pneumonia. Lungs otherwise clear. No localized airspace consolidation. No pleural effusions. No pneumothorax.  IMPRESSION: 1. Improving partial small bowel obstruction or ileus, with fewer distended loops of small bowel in the abdomen when compared to the examination earlier same date. No free intraperitoneal air. 2. Stable marked cardiomegaly. Pulmonary venous hypertension without overt edema. Scarring at the right lung base. No acute cardiopulmonary disease.   Electronically Signed   By: Hulan Saashomas  Lawrence M.D.   On: 12/30/2013 21:13    2D Echo: none  Cardiac Cath: none  Admission  HPI:    [name] A Kaley is a 58 year old Veteran with past medical history of ESRD on TTS HD, COPD, HTN, decompensated liver cirrhosis, and afib (not on coumadin) who presents with depression. The patient was recently hospitalized 2/26-2/27 for an admission for shortness of breath, due to missing part of an HD session 2/26. He went home on 2/27, but upon arrival home, he states "everything went hell", with worsening of his PTSD as he is on no medcations. He reports depression and delusions with no suicidal ideation. He states that he felt he needed to get to the mental health [facility] in the [facility], so he called 911, but EMS said that the [facility] was too far, so they brought him to our ED instead. He notes no worsening of his SOB from baseline. His only complaint is abdominal distension and is asking to have HD at this time. We were asked to admit the patient for the reason of transferring the patient to the [facility].   Hospital Course by problem list:  Recurrent Severe Major Depression and PTSD - Pt presented with worsening depression and PTSD (reported to flashbacks) with no SI or HI during hospitalization. Pt was at times irritable, agitated, confrontational, and anxious throughout hospitalization. He was initially found to require inpatient psychiatry treatment per psychiatry evaluation. Pt was found to be medically stable and cleared for inpatient psychiatric treatment but [facility] was an issue as [facility] due to his other medical problems (ESRD requiring HD). He was also not appropriate for [facility] due to his medical acuity/lack of compliance and lack of SI/HI/Psychosis. Per psychiatric recommendations pt was placed under IVC due to multiple threats to leave AMA and because he was considered a threat to himself without proper medical treatment and appropriate psychosocial support. This was later reversed after his mood improved and he was no longer considered a threat to himself and was deemed  appropriate to undergo outpatient psychiatric care which he stated [facility] which was confirmed by psychiatry SW. Pt was started on sertraline 25 mg daily and quetiapine 50 mg daily during hospitalization per psychiatry recommendations. Pt was also continued on trazadone 50 mg daily at bedtime. He was discontinued on previous prazosin  for PTSD. Pt stated he did not want to be on sertraline or quetiapine as they made his mood worse, as a result these medications were not prescribed or provided (per SW due to his insurance he was not eligible to receive any free medications or  enrollment in [place] program) to the patient at time he left AMA. There was also no 100% certainty that he would attend follow-up care for monitoring of these medications for it to be prescribed as there is increased risk of suicide risk with use and close monitoring is required.     ESRD - Pt on TTS schedule at [facility]. Pt received HD 3/1 and shorter session on 3/2 (signed off 34 minutes early). Pt received renavite daily and phoslo 1334 TID with meals.    COPD - Pt with no acute exacerbation during hospitalization. Pt not on home oxygen, rescue, or daily maintenance inhalers. Reports having PFT's at [place] in [place] and told he should be on home oxygen. Pt was continued on daily spiriva and albuterol as needed during hospitalization.    Recent HCAP - Pt was recently hospitalized and treated for HCAP from 2/11-2/15 and was to take Levaquin 250 mg Q 48 hr for 14 days however not able to afford medication. Pt then hospitalized 2/20-2/23 and started on Augmentin which received for 3 days and to complete 7 day course which he did not complete due to inability to afford medication. Chest xray on 2/27  unchanged from prior with no leukocytosis, fever, productive cough, or dyspnea. Pt did not receive antibiotics during hospitalization as there was clinical resolution of pneumonia.   Decompensated Liver  Cirrhosis - Pt with liver function testing on admission that was at baseline. PTT was elevated at 41 with no active bleeding. Etiology most likely due to chronic hepatitis C infection. Pt had no fever, encephalopathy, significant leukocytosis, or worsening ascites to suggest SBP or warrant paracentesis.  Pt was continued on lactulose, hydroxyzine (as needed for pruritis),  folic acid 1 mg daily, and phenergan as needed for nausea.   Secondary Hyperparathyroidism - Pt with stable calcium and phosphorous during hospitalization. Etiology most likely secondary due to ESRD. Pt was continued on phoslo TID with meals and on weekly hectoral with HD.   Severe Malnutrition - Pt with chronic hypoalbuminemia in setting of ESRD and liver cirrhosis. Pt with evidence of severe muscle wasting and 8% weight loss in last month with BMI of 20.12 Pt received a renal diet during hospitalization with nutrition supplements (nepro Shake) with meals.  Weight on discharge was 110 lb.   Chronic Hypervolemic Hyponatremia - Pt with chronic hyponatremia in setting of volume overload due to liver cirrhosis and ESRD. Pt was continued on HD as scheduled during hospitalization.    Chronic Prolonged QT - Pt with QTc of 643 on 12/30/13. Etiology likely due to medications vs chronic hypocalcemia in setting of secondary hyperparathyroidism. QT prolonging medications were avoided during hospitalization.    Chronic Anion Gap Metabolic Acidosis- Pt with AG of 18 (19 on admission). Etiology most likely chronic due to hyperphosphatemia in setting of ESRD vs starvation ketoacidosis (poor PO intake as unable to afford food).   Hypertension - Pt with blood pressure range of 105/68 - 189/97 during hospitalization. Pt initially received metoprolol 50 mg daily then metoprolol tartrate 12.5 mg BID per nephrology recommendations to assist in volume removal during HD. Amlodipine 10 mg daily initially administered was later held during hospitalization per  nephrology recommendations.   Paroxysmal Atrial  Fibrillation - Pt was monitored on telemetry with normal HR and remained in NSR during hospitalization. CHADS score of 1 (HTN) on daily AP therapy. Per [facility] records he was for a period of time on coumadin for [facility] therapy. Pt was continued on 81 mg aspirin daily. Pt initially received metoprolol 50 mg daily then switched to metoprolol tartrate 12.5 mg BID per nephrology recommendations due to   Chronic Thrombocytopenia - Pt with platlet count above baseline of 100K during hospitalization. Pt with no recent active bleeding or bruising.   Chronic Microcytic Anemia - Pt with stable hemoglobin near baseline of 10 during hospitalization with no active bleeding or requirement for blood transfusion. Etiology likely due to CKD. Pt receives ESA and erythropoetin weekly at HD.  Coronary Artery Disease - Pt with history of MI per [facility] records. Pt did not have chest pain during hospitalization and was continued on 81 mg aspirin daily.   Social Issues - Pt currently living at extended stay hotel and unable to afford any of his medications, however reported he will receive VA check March 1st. Pt to [facility]up with [facility] in [place] on March 6.    Tobacco abuse - PT with 7.5 pack year history. Pt refused nicotine patch during hospitalization. Pt did not have symptoms of nicotine withdrawal during hospitalization.   DVT Prophylaxis - Pt received SQ heparin during hospitalization with no evidence of thrombosis or HIT syndrome.      Discharge Vitals:   BP 153/86  Pulse 81  Temp(Src) 99 F (37.2 C) (Oral)  Resp 18  Ht 5\' 5"  (1.651 m)  Wt 50.3 kg (110 lb 14.3 oz)  BMI 18.45 kg/m2  SpO2 100%  Discharge Labs:  No results found for this or any previous visit (from the past 24 hour(s)).  Signed: Otis Brace, MD 01/11/2014, 9:21 PM   Time Spent on Discharge: Pt left AMA Services Ordered on Discharge: none Equipment Ordered on Discharge: none

## 2014-01-14 ENCOUNTER — Emergency Department (HOSPITAL_COMMUNITY): Payer: Medicare Other

## 2014-01-14 ENCOUNTER — Encounter (HOSPITAL_COMMUNITY): Payer: Self-pay | Admitting: Emergency Medicine

## 2014-01-14 ENCOUNTER — Inpatient Hospital Stay (HOSPITAL_COMMUNITY)
Admission: EM | Admit: 2014-01-14 | Discharge: 2014-02-01 | DRG: 885 | Disposition: A | Discharge status: Home / Self Care | Payer: Medicare Other | Attending: Internal Medicine | Admitting: Internal Medicine

## 2014-01-14 DIAGNOSIS — F17200 Nicotine dependence, unspecified, uncomplicated: Secondary | ICD-10-CM | POA: Diagnosis present

## 2014-01-14 DIAGNOSIS — F332 Major depressive disorder, recurrent severe without psychotic features: Principal | ICD-10-CM | POA: Diagnosis present

## 2014-01-14 DIAGNOSIS — F4310 Post-traumatic stress disorder, unspecified: Secondary | ICD-10-CM | POA: Diagnosis present

## 2014-01-14 DIAGNOSIS — Z5900 Homelessness unspecified: Secondary | ICD-10-CM

## 2014-01-14 DIAGNOSIS — J4489 Other specified chronic obstructive pulmonary disease: Secondary | ICD-10-CM | POA: Diagnosis present

## 2014-01-14 DIAGNOSIS — R9431 Abnormal electrocardiogram [ECG] [EKG]: Secondary | ICD-10-CM | POA: Diagnosis present

## 2014-01-14 DIAGNOSIS — F329 Major depressive disorder, single episode, unspecified: Secondary | ICD-10-CM

## 2014-01-14 DIAGNOSIS — I2510 Atherosclerotic heart disease of native coronary artery without angina pectoris: Secondary | ICD-10-CM | POA: Diagnosis present

## 2014-01-14 DIAGNOSIS — E8809 Other disorders of plasma-protein metabolism, not elsewhere classified: Secondary | ICD-10-CM | POA: Diagnosis present

## 2014-01-14 DIAGNOSIS — N186 End stage renal disease: Secondary | ICD-10-CM

## 2014-01-14 DIAGNOSIS — E871 Hypo-osmolality and hyponatremia: Secondary | ICD-10-CM | POA: Diagnosis present

## 2014-01-14 DIAGNOSIS — N2581 Secondary hyperparathyroidism of renal origin: Secondary | ICD-10-CM | POA: Diagnosis present

## 2014-01-14 DIAGNOSIS — E875 Hyperkalemia: Secondary | ICD-10-CM | POA: Diagnosis present

## 2014-01-14 DIAGNOSIS — F1410 Cocaine abuse, uncomplicated: Secondary | ICD-10-CM | POA: Diagnosis present

## 2014-01-14 DIAGNOSIS — Z992 Dependence on renal dialysis: Secondary | ICD-10-CM

## 2014-01-14 DIAGNOSIS — Z8659 Personal history of other mental and behavioral disorders: Secondary | ICD-10-CM

## 2014-01-14 DIAGNOSIS — E43 Unspecified severe protein-calorie malnutrition: Secondary | ICD-10-CM | POA: Diagnosis present

## 2014-01-14 DIAGNOSIS — M949 Disorder of cartilage, unspecified: Secondary | ICD-10-CM

## 2014-01-14 DIAGNOSIS — D696 Thrombocytopenia, unspecified: Secondary | ICD-10-CM | POA: Diagnosis not present

## 2014-01-14 DIAGNOSIS — J449 Chronic obstructive pulmonary disease, unspecified: Secondary | ICD-10-CM | POA: Diagnosis present

## 2014-01-14 DIAGNOSIS — E872 Acidosis, unspecified: Secondary | ICD-10-CM | POA: Diagnosis present

## 2014-01-14 DIAGNOSIS — E440 Moderate protein-calorie malnutrition: Secondary | ICD-10-CM

## 2014-01-14 DIAGNOSIS — B182 Chronic viral hepatitis C: Secondary | ICD-10-CM | POA: Diagnosis present

## 2014-01-14 DIAGNOSIS — I252 Old myocardial infarction: Secondary | ICD-10-CM

## 2014-01-14 DIAGNOSIS — R4586 Emotional lability: Secondary | ICD-10-CM | POA: Diagnosis present

## 2014-01-14 DIAGNOSIS — R188 Other ascites: Secondary | ICD-10-CM | POA: Diagnosis present

## 2014-01-14 DIAGNOSIS — D509 Iron deficiency anemia, unspecified: Secondary | ICD-10-CM | POA: Diagnosis present

## 2014-01-14 DIAGNOSIS — F1910 Other psychoactive substance abuse, uncomplicated: Secondary | ICD-10-CM | POA: Diagnosis present

## 2014-01-14 DIAGNOSIS — F411 Generalized anxiety disorder: Secondary | ICD-10-CM | POA: Diagnosis present

## 2014-01-14 DIAGNOSIS — L299 Pruritus, unspecified: Secondary | ICD-10-CM | POA: Diagnosis not present

## 2014-01-14 DIAGNOSIS — K7460 Unspecified cirrhosis of liver: Secondary | ICD-10-CM | POA: Diagnosis present

## 2014-01-14 DIAGNOSIS — I10 Essential (primary) hypertension: Secondary | ICD-10-CM

## 2014-01-14 DIAGNOSIS — I4891 Unspecified atrial fibrillation: Secondary | ICD-10-CM | POA: Diagnosis not present

## 2014-01-14 DIAGNOSIS — R45851 Suicidal ideations: Secondary | ICD-10-CM

## 2014-01-14 DIAGNOSIS — IMO0002 Reserved for concepts with insufficient information to code with codable children: Secondary | ICD-10-CM

## 2014-01-14 DIAGNOSIS — M899 Disorder of bone, unspecified: Secondary | ICD-10-CM | POA: Diagnosis present

## 2014-01-14 DIAGNOSIS — I120 Hypertensive chronic kidney disease with stage 5 chronic kidney disease or end stage renal disease: Secondary | ICD-10-CM | POA: Diagnosis present

## 2014-01-14 DIAGNOSIS — F32A Depression, unspecified: Secondary | ICD-10-CM

## 2014-01-14 LAB — CBC
HEMATOCRIT: 30.3 % — AB (ref 39.0–52.0)
HEMOGLOBIN: 10.6 g/dL — AB (ref 13.0–17.0)
MCH: 26.7 pg (ref 26.0–34.0)
MCHC: 35 g/dL (ref 30.0–36.0)
MCV: 76.3 fL — AB (ref 78.0–100.0)
Platelets: 145 10*3/uL — ABNORMAL LOW (ref 150–400)
RBC: 3.97 MIL/uL — ABNORMAL LOW (ref 4.22–5.81)
RDW: 21 % — ABNORMAL HIGH (ref 11.5–15.5)
WBC: 9.1 10*3/uL (ref 4.0–10.5)

## 2014-01-14 LAB — RENAL FUNCTION PANEL
Albumin: 3 g/dL — ABNORMAL LOW (ref 3.5–5.2)
BUN: 95 mg/dL — ABNORMAL HIGH (ref 6–23)
CALCIUM: 9 mg/dL (ref 8.4–10.5)
CO2: 16 mEq/L — ABNORMAL LOW (ref 19–32)
CREATININE: 8.55 mg/dL — AB (ref 0.50–1.35)
Chloride: 88 mEq/L — ABNORMAL LOW (ref 96–112)
GFR calc Af Amer: 7 mL/min — ABNORMAL LOW (ref >90)
GFR calc non Af Amer: 6 mL/min — ABNORMAL LOW (ref >90)
GLUCOSE: 120 mg/dL — AB (ref 70–99)
PHOSPHORUS: 9.8 mg/dL — AB (ref 2.3–4.6)
Potassium: 6.8 mEq/L (ref 3.7–5.3)
Sodium: 132 mEq/L — ABNORMAL LOW (ref 137–147)

## 2014-01-14 LAB — SALICYLATE LEVEL: Salicylate Lvl: <2 mg/dL — ABNORMAL LOW (ref 2.8–20.0)

## 2014-01-14 LAB — CBC WITH DIFFERENTIAL/PLATELET
BASOS ABS: 0.1 10*3/uL (ref 0.0–0.1)
Basophils Relative: 1 % (ref 0–1)
EOS PCT: 0 % (ref 0–5)
Eosinophils Absolute: 0 10*3/uL (ref 0.0–0.7)
HCT: 32.9 % — ABNORMAL LOW (ref 39.0–52.0)
HEMOGLOBIN: 11.3 g/dL — AB (ref 13.0–17.0)
LYMPHS PCT: 21 % (ref 12–46)
Lymphs Abs: 2.2 10*3/uL (ref 0.7–4.0)
MCH: 26.3 pg (ref 26.0–34.0)
MCHC: 34.3 g/dL (ref 30.0–36.0)
MCV: 76.7 fL — ABNORMAL LOW (ref 78.0–100.0)
MONOS PCT: 7 % (ref 3–12)
Monocytes Absolute: 0.7 10*3/uL (ref 0.1–1.0)
NEUTROS ABS: 7.3 10*3/uL (ref 1.7–7.7)
Neutrophils Relative %: 71 % (ref 43–77)
Platelets: 243 10*3/uL (ref 150–400)
RBC: 4.29 MIL/uL (ref 4.22–5.81)
RDW: 22.9 % — ABNORMAL HIGH (ref 11.5–15.5)
WBC: 10.3 10*3/uL (ref 4.0–10.5)

## 2014-01-14 LAB — COMPREHENSIVE METABOLIC PANEL
ALK PHOS: 244 U/L — AB (ref 39–117)
ALT: 50 U/L (ref 0–53)
AST: 75 U/L — ABNORMAL HIGH (ref 0–37)
Albumin: 3.1 g/dL — ABNORMAL LOW (ref 3.5–5.2)
BILIRUBIN TOTAL: 1.9 mg/dL — AB (ref 0.3–1.2)
BUN: 90 mg/dL — ABNORMAL HIGH (ref 6–23)
CO2: 17 meq/L — AB (ref 19–32)
Calcium: 9.2 mg/dL (ref 8.4–10.5)
Chloride: 87 mEq/L — ABNORMAL LOW (ref 96–112)
Creatinine, Ser: 8.16 mg/dL — ABNORMAL HIGH (ref 0.50–1.35)
GFR, EST AFRICAN AMERICAN: 7 mL/min — AB (ref >90)
GFR, EST NON AFRICAN AMERICAN: 6 mL/min — AB (ref >90)
GLUCOSE: 85 mg/dL (ref 70–99)
POTASSIUM: 7.3 meq/L — AB (ref 3.7–5.3)
SODIUM: 130 meq/L — AB (ref 137–147)
Total Protein: 7.6 g/dL (ref 6.0–8.3)

## 2014-01-14 LAB — ACETAMINOPHEN LEVEL: Acetaminophen (Tylenol), Serum: <15 ug/mL (ref 10–30)

## 2014-01-14 LAB — PRO B NATRIURETIC PEPTIDE: Pro B Natriuretic peptide (BNP): >70000 pg/mL — ABNORMAL HIGH (ref 0–125)

## 2014-01-14 LAB — ETHANOL: Alcohol, Ethyl (B): <11 mg/dL (ref 0–11)

## 2014-01-14 MED ORDER — ACETAMINOPHEN 650 MG RE SUPP
650.0000 mg | Freq: Four times a day (QID) | RECTAL | Status: DC | PRN
  Filled 2014-01-14: qty 1

## 2014-01-14 MED ORDER — TIOTROPIUM BROMIDE MONOHYDRATE 18 MCG IN CAPS
18.0000 ug | ORAL_CAPSULE | Freq: Every day | RESPIRATORY_TRACT | Status: DC
  Administered 2014-01-15 – 2014-02-01 (×13): 18 ug via RESPIRATORY_TRACT
  Filled 2014-01-14 (×4): qty 5

## 2014-01-14 MED ORDER — DOXERCALCIFEROL 4 MCG/2ML IV SOLN
2.0000 ug | INTRAVENOUS | Status: DC
  Administered 2014-01-14 – 2014-01-31 (×9): 2 ug via INTRAVENOUS
  Filled 2014-01-14 (×8): qty 2

## 2014-01-14 MED ORDER — CAMPHOR-MENTHOL 0.5-0.5 % EX LOTN
1.0000 "application " | TOPICAL_LOTION | Freq: Three times a day (TID) | CUTANEOUS | Status: DC | PRN
  Filled 2014-01-14: qty 222

## 2014-01-14 MED ORDER — SODIUM CHLORIDE 0.9 % IV SOLN: 100.0000 mL | INTRAVENOUS | Status: DC | PRN

## 2014-01-14 MED ORDER — NEPRO/CARBSTEADY PO LIQD
237.0000 mL | Freq: Three times a day (TID) | ORAL | Status: DC | PRN
  Filled 2014-01-14: qty 237

## 2014-01-14 MED ORDER — LACTULOSE 10 GM/15ML PO SOLN
10.0000 g | Freq: Three times a day (TID) | ORAL | Status: DC
  Administered 2014-01-16 – 2014-01-31 (×38): 10 g via ORAL
  Filled 2014-01-14 (×55): qty 15

## 2014-01-14 MED ORDER — HYDROXYZINE HCL 25 MG PO TABS
25.0000 mg | ORAL_TABLET | Freq: Once | ORAL | Status: AC
  Administered 2014-01-14: 25 mg via ORAL
  Filled 2014-01-14: qty 1

## 2014-01-14 MED ORDER — ONDANSETRON HCL 4 MG PO TABS
4.0000 mg | ORAL_TABLET | Freq: Four times a day (QID) | ORAL | Status: DC | PRN
  Administered 2014-01-27: 4 mg via ORAL
  Filled 2014-01-14 (×2): qty 1

## 2014-01-14 MED ORDER — HEPARIN SODIUM (PORCINE) 5000 UNIT/ML IJ SOLN
5000.0000 [IU] | Freq: Three times a day (TID) | INTRAMUSCULAR | Status: DC
  Filled 2014-01-14 (×57): qty 1

## 2014-01-14 MED ORDER — SODIUM CHLORIDE 0.9 % IV SOLN: 250.0000 mL | INTRAVENOUS | Status: DC | PRN

## 2014-01-14 MED ORDER — LIDOCAINE HCL (PF) 1 % IJ SOLN: 5.0000 mL | INTRAMUSCULAR | Status: DC | PRN

## 2014-01-14 MED ORDER — FOLIC ACID 1 MG PO TABS
1.0000 mg | ORAL_TABLET | Freq: Every day | ORAL | Status: DC
  Administered 2014-01-14 – 2014-01-31 (×18): 1 mg via ORAL
  Filled 2014-01-14 (×19): qty 1

## 2014-01-14 MED ORDER — CALCIUM GLUCONATE 10 % IV SOLN
1.0000 g | Freq: Once | INTRAVENOUS | Status: AC
  Administered 2014-01-14: 1 g via INTRAVENOUS
  Filled 2014-01-14: qty 10

## 2014-01-14 MED ORDER — BENZONATATE 100 MG PO CAPS
100.0000 mg | ORAL_CAPSULE | Freq: Two times a day (BID) | ORAL | Status: DC | PRN
  Filled 2014-01-14: qty 1

## 2014-01-14 MED ORDER — METOPROLOL TARTRATE 12.5 MG HALF TABLET
12.5000 mg | ORAL_TABLET | Freq: Two times a day (BID) | ORAL | Status: DC
  Administered 2014-01-14 – 2014-01-15 (×2): 12.5 mg via ORAL
  Filled 2014-01-14 (×3): qty 1

## 2014-01-14 MED ORDER — SORBITOL 70 % SOLN
30.0000 mL | Status: DC | PRN
  Filled 2014-01-14: qty 30

## 2014-01-14 MED ORDER — CALCIUM CARBONATE 1250 MG/5ML PO SUSP
500.0000 mg | Freq: Four times a day (QID) | ORAL | Status: DC | PRN
  Filled 2014-01-14: qty 5

## 2014-01-14 MED ORDER — ACETAMINOPHEN 325 MG PO TABS
650.0000 mg | ORAL_TABLET | Freq: Four times a day (QID) | ORAL | Status: DC | PRN
  Administered 2014-01-26 – 2014-01-30 (×4): 650 mg via ORAL
  Filled 2014-01-14 (×4): qty 2

## 2014-01-14 MED ORDER — DOXERCALCIFEROL 4 MCG/2ML IV SOLN
INTRAVENOUS | Status: AC
  Filled 2014-01-14: qty 2

## 2014-01-14 MED ORDER — QUETIAPINE FUMARATE 50 MG PO TABS
50.0000 mg | ORAL_TABLET | Freq: Every day | ORAL | Status: DC
  Administered 2014-01-14 – 2014-01-31 (×18): 50 mg via ORAL
  Filled 2014-01-14 (×19): qty 1

## 2014-01-14 MED ORDER — HEPARIN SODIUM (PORCINE) 1000 UNIT/ML DIALYSIS
3000.0000 [IU] | Freq: Once | INTRAMUSCULAR | Status: DC
  Filled 2014-01-14: qty 3

## 2014-01-14 MED ORDER — MORPHINE SULFATE 2 MG/ML IJ SOLN
1.0000 mg | Freq: Once | INTRAMUSCULAR | Status: AC
  Administered 2014-01-14: 1 mg via INTRAVENOUS

## 2014-01-14 MED ORDER — HYDROXYZINE HCL 25 MG PO TABS
ORAL_TABLET | ORAL | Status: AC
  Filled 2014-01-14: qty 1

## 2014-01-14 MED ORDER — PENTAFLUOROPROP-TETRAFLUOROETH EX AERO
1.0000 "application " | INHALATION_SPRAY | CUTANEOUS | Status: DC | PRN
  Administered 2014-01-14 – 2014-01-19 (×2): 1 via TOPICAL

## 2014-01-14 MED ORDER — PENTAFLUOROPROP-TETRAFLUOROETH EX AERO
INHALATION_SPRAY | CUTANEOUS | Status: AC
  Administered 2014-01-14: 1 via TOPICAL
  Filled 2014-01-14: qty 103.5

## 2014-01-14 MED ORDER — DOCUSATE SODIUM 283 MG RE ENEM
1.0000 | ENEMA | RECTAL | Status: DC | PRN
  Filled 2014-01-14: qty 1

## 2014-01-14 MED ORDER — HYDROXYZINE HCL 25 MG PO TABS
25.0000 mg | ORAL_TABLET | Freq: Three times a day (TID) | ORAL | Status: DC | PRN
  Administered 2014-01-14 – 2014-01-31 (×23): 25 mg via ORAL
  Filled 2014-01-14 (×24): qty 1

## 2014-01-14 MED ORDER — MORPHINE SULFATE 2 MG/ML IJ SOLN
INTRAMUSCULAR | Status: AC
  Administered 2014-01-14: 1 mg via INTRAVENOUS
  Filled 2014-01-14: qty 1

## 2014-01-14 MED ORDER — INSULIN ASPART 100 UNIT/ML ~~LOC~~ SOLN
5.0000 [IU] | Freq: Once | SUBCUTANEOUS | Status: AC
  Administered 2014-01-14: 5 [IU] via INTRAVENOUS
  Filled 2014-01-14: qty 1

## 2014-01-14 MED ORDER — HEPARIN SODIUM (PORCINE) 1000 UNIT/ML DIALYSIS
1000.0000 [IU] | INTRAMUSCULAR | Status: DC | PRN
  Filled 2014-01-14: qty 1

## 2014-01-14 MED ORDER — LIDOCAINE-PRILOCAINE 2.5-2.5 % EX CREA
1.0000 | TOPICAL_CREAM | CUTANEOUS | Status: DC | PRN
Start: 2014-01-14 — End: 2014-01-23

## 2014-01-14 MED ORDER — SODIUM CHLORIDE 0.9 % IJ SOLN: 3.0000 mL | INTRAMUSCULAR | Status: DC | PRN

## 2014-01-14 MED ORDER — SERTRALINE HCL 25 MG PO TABS
25.0000 mg | ORAL_TABLET | Freq: Every day | ORAL | Status: DC
  Administered 2014-01-14 – 2014-01-31 (×18): 25 mg via ORAL
  Filled 2014-01-14 (×20): qty 1

## 2014-01-14 MED ORDER — DIPHENHYDRAMINE HCL 25 MG PO CAPS
25.0000 mg | ORAL_CAPSULE | Freq: Once | ORAL | Status: DC
  Filled 2014-01-14: qty 1

## 2014-01-14 MED ORDER — SODIUM CHLORIDE 0.9 % IJ SOLN
3.0000 mL | Freq: Two times a day (BID) | INTRAMUSCULAR | Status: DC
  Administered 2014-01-15 – 2014-01-31 (×24): 3 mL via INTRAVENOUS

## 2014-01-14 MED ORDER — ALTEPLASE 2 MG IJ SOLR
2.0000 mg | Freq: Once | INTRAMUSCULAR | Status: AC | PRN
  Filled 2014-01-14: qty 2

## 2014-01-14 MED ORDER — DEXTROSE 50 % IV SOLN
50.0000 mL | Freq: Once | INTRAVENOUS | Status: AC
  Administered 2014-01-14: 50 mL via INTRAVENOUS
  Filled 2014-01-14: qty 50

## 2014-01-14 MED ORDER — NEPRO/CARBSTEADY PO LIQD: 237.0000 mL | ORAL | Status: DC | PRN

## 2014-01-14 MED ORDER — SODIUM CHLORIDE 0.9 % IJ SOLN: 3.0000 mL | Freq: Two times a day (BID) | INTRAMUSCULAR | Status: DC

## 2014-01-14 MED ORDER — GUAIFENESIN ER 600 MG PO TB12
600.0000 mg | ORAL_TABLET | Freq: Two times a day (BID) | ORAL | Status: DC | PRN
Start: 2014-01-14 — End: 2014-02-01
  Filled 2014-01-14: qty 1

## 2014-01-14 MED ORDER — ASPIRIN 81 MG PO CHEW
81.0000 mg | CHEWABLE_TABLET | Freq: Every day | ORAL | Status: DC
  Administered 2014-01-14 – 2014-01-31 (×18): 81 mg via ORAL
  Filled 2014-01-14 (×18): qty 1

## 2014-01-14 MED ORDER — ONDANSETRON HCL 4 MG/2ML IJ SOLN
4.0000 mg | Freq: Four times a day (QID) | INTRAMUSCULAR | Status: DC | PRN
  Administered 2014-01-26: 4 mg via INTRAVENOUS
  Filled 2014-01-14: qty 2

## 2014-01-14 MED ORDER — TIOTROPIUM BROMIDE MONOHYDRATE 18 MCG IN CAPS
18.0000 ug | ORAL_CAPSULE | Freq: Every day | RESPIRATORY_TRACT | Status: DC
  Filled 2014-01-14: qty 5

## 2014-01-14 MED ORDER — TRAZODONE HCL 100 MG PO TABS
100.0000 mg | ORAL_TABLET | Freq: Every day | ORAL | Status: DC
  Administered 2014-01-14: 100 mg via ORAL
  Filled 2014-01-14 (×2): qty 1

## 2014-01-14 MED ORDER — ZOLPIDEM TARTRATE 5 MG PO TABS: 5.0000 mg | ORAL_TABLET | Freq: Every evening | ORAL | Status: DC | PRN

## 2014-01-14 MED ORDER — SODIUM BICARBONATE 8.4 % IV SOLN
50.0000 meq | Freq: Once | INTRAVENOUS | Status: AC
  Administered 2014-01-14: 50 meq via INTRAVENOUS
  Filled 2014-01-14: qty 50

## 2014-01-14 MED ORDER — ALBUTEROL SULFATE (2.5 MG/3ML) 0.083% IN NEBU: 2.5000 mg | INHALATION_SOLUTION | Freq: Four times a day (QID) | RESPIRATORY_TRACT | Status: DC | PRN

## 2014-01-14 NOTE — Procedures (Signed)
I was present at this dialysis session. I have reviewed the session itself and made appropriate changes.   Pt tolerated most of treatment well but nearing end became argumentative and hostile towards staff, req treatment to be stopped before full time.    Sabra Heck  MD 01/14/2014, 7:15 PM

## 2014-01-14 NOTE — Consult Note (Signed)
Pt argumentative and mostly uncooperative with care and treatment.  To me it is unclear if he has clear understanding of his decisions and appreciate input of psychiatry for decision making capacity.    He became agreeable to HD today after [facility] crisis line said they can work with pt on 3/9.    Sabra Heck, MD

## 2014-01-14 NOTE — Progress Notes (Signed)
Requested to attempt PIV.  Pt began asking for the doctor prior to stick.  Argumentative.  Attempted PIV, as soon as needle entered skin, pt began yelling "take it out take it out!"  Tensing up with any needle movement and stating to remove needle and call the doctor.  Staff RN at bedside aware of situation.  No further attempts by this RN due to pt refusal.

## 2014-01-14 NOTE — Progress Notes (Signed)
Called to get report on pt, RN is with pt and unable to give report. Will try again later.

## 2014-01-14 NOTE — Progress Notes (Signed)
Pt arrived to unit argumentative accompanied by RN and suicide sitter. Called for HD prior to assessment. Transferred pt to hospital bed and sent to HD.

## 2014-01-14 NOTE — ED Notes (Signed)
Patient transported to X-ray 

## 2014-01-14 NOTE — [facility] Assessment (Addendum)
[facility] Assessment Progress Note  TTS consult called in for pt.  Spoke with EDP Romeo Apple @ 1202 who stated pt being medically admitted.  Therefore, a TTS tele assessment will not be completed in ED.  Casimer Lanius, MS, [facility] Licensed Professional Counselor Triage Specialist

## 2014-01-14 NOTE — H&P (Signed)
Date: 01/14/2014               Patient Name:  Carl Benson MRN: [id number]  DOB: 07-06-1956 Age / Sex: 58 y.o., male   PCP: No Pcp Per Patient         Medical Service: Internal Medicine Teaching Service         Attending Physician: Dr. Aletta Edouard, MD    First Contact: Dr. Johna Roles Pager: [phone]  Second Contact: Dr. Garald Braver  Pager: [phone]       After Hours (After 5p/  First Contact Pager: [phone]  weekends / holidays): Second Contact Pager: [phone]   Chief Complaint:  "I want to die"  History of Present Illness:   Carl Benson is a 58 year old Veteran with past medical history of ESRD on TTS HD, COPD, HTN, decompensated liver cirrhosis, and afib (not on coumadin) who presents with active suicidal thoughts. Pt reports that after leaving the hospital AMA he he had no place to go and came to the ED today to die. Per ED note he stated that " I want be committed to the mental health ward at the [facility]" and "I skipped HD Thursday on purpose and don't want to go anymore." "I was going to take a knife and slash my fistula last night" and "I have a little boy in my head telling me to run out in traffic."  "I wanted to see a psychiatrist and you can't make me go back out on the street."  He is currently uncooperative, very combatative, and refusing to eat or have HD today. He reports being suicidal for the past 6 months and binged on crack cocaine 2 days ago. Per nephrology note, "[facility] with the patient present and agreed to contact him on Monday 3/9 about his case. He then agreed to treatment until he received their response on Monday and will have dialysis today."   Pt recently hospitalized from 2/27 to 3/4 for worsening depression and PTSD (reported to flashbacks) with no SI or HI during hospitalization. Pt was at times irritable, agitated, confrontational, and anxious throughout hospitalization. He was initially found to require inpatient psychiatry treatment per  psychiatry evaluation. Pt was found to be medically stable and cleared for inpatient psychiatric treatment but placement was an issue as [facility] would not [facility] this patient due to his other medical problems (ESRD requiring HD). He was also not appropriate for [facility] due to his medical acuity/lack of compliance and lack of SI/HI/Psychosis. Per psychiatric recommendations pt was placed under IVC due to multiple threats to leave AMA and because he was considered a threat to himself without proper medical treatment and appropriate psychosocial support. This was later reversed after his mood improved and he was no longer considered a threat to himself and was deemed appropriate to undergo outpatient psychiatric care which he stated he had an appointment on 3/6 at [facility] which was confirmed by psychiatry SW. Pt was started on sertraline 25 mg daily and quetiapine 50 mg daily during hospitalization per psychiatry recommendations. Pt was also continued on trazadone 50 mg daily at bedtime. He was discontinued on previous prazosin for PTSD. Pt stated he did not want to be on sertraline or quetiapine as they made his mood worse, as a result these medications were not prescribed or provided (per SW due to his insurance he was not eligible to receive any free medications or enrollment in [facility] program) to the patient at time  he left AMA. There was also no 100% certainty that he would attend follow-up care for monitoring of these medications for it to be prescribed as there is increased risk of suicide risk with use and close monitoring is required.     Meds:   Current Facility-Administered Medications  Medication Dose Route Frequency Provider Last Rate Last Dose  . dextrose 50 % solution 50 mL  50 mL Intravenous Once Junius ArgyleForrest S Harrison, MD      . diphenhydrAMINE (BENADRYL) capsule 25 mg  25 mg Oral Once Junius ArgyleForrest S Harrison, MD      . insulin aspart (novoLOG) injection 5 Units  5 Units Intravenous Once Junius ArgyleForrest S  Harrison, MD      . sodium bicarbonate injection 50 mEq  50 mEq Intravenous Once Junius ArgyleForrest S Harrison, MD       No current outpatient prescriptions on file.    Allergies: Allergies as of 01/14/2014  . (No Known Allergies)   Past Medical History  Diagnosis Date  . COPD (chronic obstructive pulmonary disease)   . Hypertension   . Hep C w/ coma, chronic   . Irregular heartbeat   . ESRD (end stage renal disease) on dialysis     /notes 11/11/2013  . Smoker unmotivated to quit   . Active smoker   . PTSD (post-traumatic stress disorder)   . Shortness of breath    Past Surgical History  Procedure Laterality Date  . Paracentesis  ~ 10/2013    [name]/notes 11/11/2013   Family History  Problem Relation Age of Onset  . Heart disease Mother   . Hypertension Mother   . Diabetes Mother    History   Social History  . Marital Status: Single    Spouse Name: N/A    Number of Children: N/A  . Years of Education: GED   Occupational History  . Not on file.   Social History Main Topics  . Smoking status: Current Every Day Smoker -- 0.50 packs/day for 15 years    Types: Cigarettes  . Smokeless tobacco: Never Used  . Alcohol Use: No     Comment: DENIES (clean 6 mo) Becomes agitated when asked about EtOH use  . Drug Use: Yes    Special: "Crack" cocaine, Cocaine, Marijuana, Other-see comments     Comment: states he has not had any illegal substances in 6 months  . Sexual Activity: Not Currently   Other Topics Concern  . Not on file   Social History Narrative   [place] [facility] [place], [place]. He is estranged from his sons. He does not have a HCPOA.    Review of Systems:  Could not be obtained because pt was not cooperative   Physical Exam: Blood pressure 138/100, pulse 91, temperature 98 F (36.7 C), temperature source Oral, resp. rate 20, SpO2 100.00%. Physical Exam  No exam performed today, patient refused exam.  Lab results: Basic  Metabolic Panel:  Recent Labs  40/98/1102/05/24 1001  NA 130*  K 7.3*  CL 87*  CO2 17*  GLUCOSE 85  BUN 90*  CREATININE 8.16*  CALCIUM 9.2   Liver Function Tests:  Recent Labs  01/14/14 1001  AST 75*  ALT 50  ALKPHOS 244*  BILITOT 1.9*  PROT 7.6  ALBUMIN 3.1*   CBC:  Recent Labs  01/14/14 0826  WBC 10.3  NEUTROABS 7.3  HGB 11.3*  HCT 32.9*  MCV 76.7*  PLT 243   BNP:  Recent  Labs  01/14/14 0826  PROBNP >70000.0*  Alcohol Level:  Recent Labs  01/14/14 1001  ETH <11    Imaging results:  Dg Chest 2 View  01/14/2014   CLINICAL DATA:  History of smoking and hypertension. Evaluate for CHF exacerbation.  EXAM: CHEST  2 VIEW  COMPARISON:  01/06/2014  FINDINGS: Two views of the chest demonstrate mild cardiomegaly. Central vascular structures are prominent but no overt pulmonary edema. No evidence for pleural effusions. Bony thorax is intact. Old right clavicle fracture.  IMPRESSION: Mild cardiomegaly with vascular congestion. No evidence for overt pulmonary edema.   Electronically Signed   By: Richarda Overlie M.D.   On: 01/14/2014 09:05    Other results: EKG:   Ventricular Rate: 80  PR Interval: 167  QRS Duration: 105  QT Interval: 412  QTC Calculation: 475  R Axis: -59  Text Interpretation: Sinus rhythm Left anterior fascicular block RSR' in V1 or V2, probably normal variant Probable anterolateral infarct, old No significant change since last tracing    Assessment   Plan:  Assessment: 58 year old with ESRD on HD, decompensated liver cirrhosis, COPD, HTN, and AFib (not on coumadin), and PTSD with multiple ED admissions in the past 3 months who presented on 3/7 with active suicidal thoughts.   Plan:   Recurrent Severe Major Depression and PTSD with Suicidal Ideation - currently with active suicidal ideation and plan (to starve and refuse HD) and labile mood. Pt placed under IVC in ED. Pt previously hospitalized from 2/27- 3/4 and started on sertraline 25 mg daily  and quetiapine 50 mg daily. Pt was to follow up with outpatient psychiatry in [facility] on 3/6 but did not.  Pt with reported binge on crack cocaine over the last two days. -Psychiatry consult (to see pt today or tomm)  -Pt placed under IVC due to SI -Psych SW assistance for transfer to inpatient psychiatric treatment -Continue previously started sertraline 25 mg daily and quetiapine 50 mg daily at bedtime  per psychiatry recommendations -Continue trazadone 50 mg daily at bedtime  -Sitter at bedside   Hyperkalemia - Pt presented with K of 7.3 with peaked peaked T waves on 12-lead EKG. In ED pt received bicarb, calcium gluconate, insulin and dextrose. Etiology due to missing HD sessions (last on 3/4).   -Emergent HD -Monitor BMP  -Monitor on telemetry  ESRD - Pt on TTS schedule at [facility].  Last HD on 3/3 in hospital.  Pt initially refusing HD, [facility] crisis called and pt agreed to treatment before [facility] contacts him on 3/9. Hyperkalemia on admission  -Renal diet  -Nephrology is following, on TTS schedule  -Continue renavite daily and phoslo 1334 TID with meals  -Monitor daily weight (110lb on discharge to 122lb?) and volume status   COPD - Pt currently with no acute exacerbation. Pt not on home oxygen, rescue or daily maintenance inhalers. Reports having PFT's at [place] in [place] and told he should be on home oxygen  -Albuterol nebulizer Q6hr PRN  -Spiriva daily for maintenance therapy   Recent HCAP - Pt was recently hospitalized and treated for HCAP from 2/11-2/15 and was to take Levaquin 250 mg Q 48 hr for 14 days however not able to afford medication. Pt then hospitalized 2/20-2/23 and started on Augmentin which received for 3 days and to complete 7 day course. Chest xray on 3/7 unchanged with no leukocytosis, fever, productive cough, or dyspnea.  -Mucinex 600 mg BID PRN for cough  -Tessalon 100 mg  PRN BID for cough   Decompensated Liver Cirrhosis - Pt with liver function  testing on admission that appears at baseline. No active bleeding. Etiology most likely due to chronic hepatitis C infection. No fever, encephalopathy, significant leukocytosis, or worsening ascites to suggest SBP or warrant paracentesis.  -Continue lactulose 10g TID  -Continue hydroxyzine 25 mg Q 6hr PRN pruritis  -Continue folic acid 1 mg daily  -Phenergan PRN nausea (avoid zofran in setting of prolonged QT)   Hyperphosphatemia in setting of Secondary Hyperparathyroidism - Pt with corrected calcium of 9.9 and phosphorous 9.8 today. Etiology most likely secondary due to ESRD.  -Continue phoslo TID with meals  -Continue weekly hectoral with HD   Severe Malnutrition - Pt with chronic hypoalbuminemia in setting of ESRD and liver cirrhosis. Pt with evidence of severe muscle wasting and 8% weight loss in last month with BMI of 20.12  -Renal diet  -Nepro Shake po TID   Chronic Hypervolemic Hyponatremia - Pt with chronic hyponatremia in setting of volume overload due to liver cirrhosis and ESRD.  -Continue HD on TTS schedule   Chronic Prolonged QT - Pt with QTc of 643 on 12/30/13, today 475.  Etiology likely due to medications vs chronic hypocalcemia in setting of secondary hyperparathyroidism.  -Avoid QT prolonging medications   Chronic Anion Gap Metabolic Acidosis- Pt with AG of 26. Etiology most likely chronic due to hyperphosphatemia in setting of ESRD vs starvation ketoacidosis (poor PO intake as unable to afford food).  -Obtain renal function panel in AM  -Renal diet   Hypertension - Currently hypertensive  -Continue metoprolol tartrate 12.5 mg BID per previous nephrology recs (previously on metoprolol 50 mg daily)  -Holding amlodipine 10 mg daily per previous nephrology recs   Paroxysmal Atrial Fibrillation - Currently normal HR and NSR. CHADS score of 1 (HTN) on daily AP therapy. Per [facility] records he was for a period of time on coumadin for [facility] therapy.  -Continue 81 mg aspirin daily    -Continue metoprolol tartrate 12.5 mg BID per previous nephrology recs (previously on metoprolol 50 mg daily)  -Monitor on telemetry   Chronic Thrombocytopenia - Platlet count of 145K above baseline of 100K. Pt with no recent active bleeding or bruising.  -Monitor for bleeding  -SQ heparin for DVT prophylaxis   Chronic Microcytic Anemia - Pt with Hg of 10.6 near baseline of 10. Etiology likely due to CKD. Pt receives ESA and erythropoetin weekly at HD.  -Monitor for bleeding  -Transfuse if Hg <7   CAD - Pt with history of MI per [facility] records. Currently without CP.  -Continue 81 mg aspirin daily   Social Issues - Currently living at extended stay hotel and unable to afford any of his medications, however reported he would receive VA check March 1st. Pt was to [facility] with [facility] on March 6.  -Psych and inpatient SW consults  Diet: Renal  DVT Ppx: SQ heparin TID  Code: Full    Dispo: Disposition is deferred at this time, awaiting improvement of current medical problems.   The patient does not have a current PCP (No Pcp Per Patient) and does need an [facility] hospital follow-up appointment after discharge.  The patient does have transportation limitations that hinder transportation to clinic appointments.  Signed: Otis Brace, MD 01/14/2014, 1:11 PM

## 2014-01-14 NOTE — ED Notes (Addendum)
Pt states, "I want to be committed to the mental health ward at the [place]", "I skipped HD Thursday on purpose and don't want to go anymore", "I was going to take a knife and slash my fistula last night" and I have a little boy in my head telling me to run out in traffic". "I want to see a psychiatrist". "You can't make me go back out on the street". Pt mentions stressors of: "declining physical health, being taken advantage of, money take from me, tired of going in & out of hospital , tired of HD, losing weight". HD on T, TH, S. Goes to [facility] location. [facility] [place] [facility] has not been to [facility] in [place].  Denies physical sx; denies: pain, sob, nausea, HA, dizziness, nvd, fever or sickness. Admits to some sob. Congested cough noted. Fistula in R upper arm. Dr. Ricarda Frame EDP in to see pt. Admits to crack use. Last crack use yesterday. Smokes cigarettes. Denies ETOH use. CN & AC notified. Security called to wand.

## 2014-01-14 NOTE — ED Notes (Signed)
Patient returned from X-ray 

## 2014-01-14 NOTE — ED Provider Notes (Signed)
CSN: [id number]     Arrival date & time 01/14/14  7829 History   First MD Initiated Contact with Patient 01/14/14 [id number]     Chief Complaint  Patient presents with  . Suicidal  . Medical Clearance     (Consider location/radiation/quality/duration/timing/severity/associated sxs/prior Treatment) Patient is a 58 y.o. male presenting with mental health disorder. The history is provided by the patient.  Mental Health Problem Presenting symptoms: depression, suicidal thoughts and suicidal threats   Degree of incapacity (severity):  Mild Onset quality:  Gradual Timing:  Constant Progression:  Worsening Chronicity:  Recurrent Treatment compliance:  Untreated Relieved by:  Nothing Worsened by:  Nothing tried Ineffective treatments:  None tried Associated symptoms: no abdominal pain, no chest pain and no headaches     Past Medical History  Diagnosis Date  . COPD (chronic obstructive pulmonary disease)   . Hypertension   . Hep C w/ coma, chronic   . Irregular heartbeat   . ESRD (end stage renal disease) on dialysis     /notes 11/11/2013  . Smoker unmotivated to quit   . Active smoker   . PTSD (post-traumatic stress disorder)   . Shortness of breath    Past Surgical History  Procedure Laterality Date  . Paracentesis  ~ 10/2013    [name] 11/11/2013   Family History  Problem Relation Age of Onset  . Heart disease Mother   . Hypertension Mother   . Diabetes Mother    History  Substance Use Topics  . Smoking status: Current Every Day Smoker -- 0.50 packs/day for 15 years    Types: Cigarettes  . Smokeless tobacco: Never Used  . Alcohol Use: No     Comment: DENIES (clean 6 mo) Becomes agitated when asked about EtOH use    Review of Systems  Constitutional: Negative for fever.  HENT: Negative for drooling and rhinorrhea.   Eyes: Negative for pain.  Respiratory: Positive for shortness of breath (occasionally). Negative for cough.   Cardiovascular: Negative for chest pain and leg  swelling.  Gastrointestinal: Negative for nausea, vomiting, abdominal pain and diarrhea.  Genitourinary: Negative for dysuria and hematuria.  Musculoskeletal: Negative for gait problem and neck pain.  Skin: Negative for color change.  Neurological: Negative for numbness and headaches.  Hematological: Negative for adenopathy.  Psychiatric/Behavioral: Positive for suicidal ideas. Negative for behavioral problems.  All other systems reviewed and are negative.      Allergies  Review of patient's allergies indicates no known allergies.  Home Medications   Current Outpatient Rx  Name  Route  Sig  Dispense  Refill  . albuterol (PROVENTIL) (2.5 MG/3ML) 0.083% nebulizer solution   Nebulization   Take 3 mLs (2.5 mg total) by nebulization every 4 (four) hours.   75 mL   12   . amLODipine (NORVASC) 10 MG tablet   Oral   Take 1 tablet (10 mg total) by mouth daily.   30 tablet   5   . aspirin EC 81 MG EC tablet   Oral   Take 1 tablet (81 mg total) by mouth daily.   30 tablet   5   . calcium acetate (PHOSLO) 667 MG capsule   Oral   Take 2 capsules (1,334 mg total) by mouth 3 (three) times daily with meals.   60 capsule   5   . folic acid (FOLVITE) 1 MG tablet   Oral   Take 1 tablet (1 mg total) by mouth daily.   30 tablet  5   . hydrOXYzine (ATARAX/VISTARIL) 25 MG tablet   Oral   Take 1 tablet (25 mg total) by mouth every 6 (six) hours as needed for itching or anxiety.   30 tablet   0   . lactulose (CHRONULAC) 10 GM/15ML solution   Oral   Take 15 mLs (10 g total) by mouth 3 (three) times daily.   240 mL   0   . metoprolol (LOPRESSOR) 50 MG tablet   Oral   Take 1 tablet (50 mg total) by mouth 2 (two) times daily.   30 tablet   5   . prazosin (MINIPRESS) 2 MG capsule   Oral   Take 2 capsules (4 mg total) by mouth at bedtime.   60 capsule   5   . tiotropium (SPIRIVA) 18 MCG inhalation capsule   Inhalation   Place 1 capsule (18 mcg total) into inhaler and  inhale daily.   30 capsule   12   . traZODone (DESYREL) 50 MG tablet   Oral   Take 1 tablet (50 mg total) by mouth at bedtime.   30 tablet   5    BP 138/100  Pulse 91  Temp(Src) 98 F (36.7 C) (Oral)  Resp 20  SpO2 100% Physical Exam  Nursing note and vitals reviewed. Constitutional: He is oriented to person, place, and time. He appears well-developed and well-nourished.  HENT:  Head: Normocephalic and atraumatic.  Right Ear: External ear normal.  Left Ear: External ear normal.  Nose: Nose normal.  Mouth/Throat: Oropharynx is clear and moist. No oropharyngeal exudate.  Eyes: Conjunctivae and EOM are normal. Pupils are equal, round, and reactive to light.  Neck: Normal range of motion. Neck supple.  Cardiovascular: Normal rate, regular rhythm, normal heart sounds and intact distal pulses.  Exam reveals no gallop and no friction rub.   No murmur heard. Pulmonary/Chest: Effort normal and breath sounds normal. No respiratory distress. He has no wheezes.  Abdominal: Soft. Bowel sounds are normal. He exhibits no distension. There is no tenderness. There is no rebound and no guarding.  Musculoskeletal: Normal range of motion. He exhibits no edema and no tenderness.  Neurological: He is alert and oriented to person, place, and time.  Skin: Skin is warm and dry.  Psychiatric: His speech is normal. He exhibits a depressed mood. He expresses suicidal ideation. He expresses suicidal plans.    ED Course  Procedures (including critical care time) Labs Review Labs Reviewed  CBC WITH DIFFERENTIAL - Abnormal; Notable for the following:    Hemoglobin 11.3 (*)    HCT 32.9 (*)    MCV 76.7 (*)    RDW 22.9 (*)    All other components within normal limits  PRO B NATRIURETIC PEPTIDE - Abnormal; Notable for the following:    Pro B Natriuretic peptide (BNP) >70000.0 (*)    All other components within normal limits  COMPREHENSIVE METABOLIC PANEL - Abnormal; Notable for the following:     Sodium 130 (*)    Potassium 7.3 (*)    Chloride 87 (*)    CO2 17 (*)    BUN 90 (*)    Creatinine, Ser 8.16 (*)    Albumin 3.1 (*)    AST 75 (*)    Alkaline Phosphatase 244 (*)    Total Bilirubin 1.9 (*)    GFR calc non Af Amer 6 (*)    GFR calc Af Amer 7 (*)    All other components within normal limits  SALICYLATE LEVEL - Abnormal; Notable for the following:    Salicylate Lvl <2.0 (*)    All other components within normal limits  ETHANOL  ACETAMINOPHEN LEVEL  URINALYSIS, ROUTINE W REFLEX MICROSCOPIC  URINE RAPID DRUG SCREEN (HOSP PERFORMED)   Imaging Review Dg Chest 2 View  01/14/2014   CLINICAL DATA:  History of smoking and hypertension. Evaluate for CHF exacerbation.  EXAM: CHEST  2 VIEW  COMPARISON:  01/06/2014  FINDINGS: Two views of the chest demonstrate mild cardiomegaly. Central vascular structures are prominent but no overt pulmonary edema. No evidence for pleural effusions. Bony thorax is intact. Old right clavicle fracture.  IMPRESSION: Mild cardiomegaly with vascular congestion. No evidence for overt pulmonary edema.   Electronically Signed   By: Richarda OverlieAdam  Henn M.D.   On: 01/14/2014 09:05     EKG Interpretation   Date/Time:  Saturday January 14 2014 07:43:04 EST Ventricular Rate:  80 PR Interval:  167 QRS Duration: 105 QT Interval:  412 QTC Calculation: 475 R Axis:   -59 Text Interpretation:  Sinus rhythm Left anterior fascicular block RSR' in  V1 or V2, probably normal variant Probable anterolateral infarct, old No  significant change since last tracing Confirmed by Joan Herschberger  MD, Shontelle Muska  ([id number]) on 01/14/2014 7:50:27 AM     Procedure note: Ultrasound Guided Peripheral IV Ultrasound guided 20 g peripheral 1.88 inch angiocath IV placement performed by me. Indications: Nursing unable to place IV. Details: The left antecubital fossa and upper arm was evaluated with a multifrequency linear probe. Several patent brachial veins are noted. 1 attempts were made to cannulate a  basilic vein under realtime US guidance with successful cannulation of the vein and catheter placement. There is return of non-pulsatile dark red blood. The patient tolerated the procedure well without complications.  CRITICAL CARE Performed by: Purvis SheffieldHARRISON, Anaaya Fuster, S Total critical care time: 30 min Critical care time was exclusive of separately billable procedures and treating other patients. Critical care was necessary to treat or prevent imminent or life-threatening deterioration. Critical care was time spent personally by me on the following activities: development of treatment plan with patient and/or surrogate as well as nursing, discussions with consultants, evaluation of patient's response to treatment, examination of patient, obtaining history from patient or surrogate, ordering and performing treatments and interventions, ordering and review of laboratory studies, ordering and review of radiographic studies, pulse oximetry and re-evaluation of patient's condition.  MDM   Final diagnoses:  Hyperkalemia  Hyponatremia  Metabolic acidosis  Suicidal ideation    7:27 AM 58 y.o. male with recent admission for mild CHF exacerbation and depression who presents with suicidal ideations. Upon review of the most recent notes it appears that the patient was discharged with a scheduled followup with [place] in [place] on March 6. He states that he has not been taking these medications because he could not afford them and was also confused where her to followup. He dialyzes on TTS, he has not had HD since this past Tues (4 days ago). He notes occasional shortness of breath but has no increased worker breathing on my exam or evidence of fluid overload. He states that he has a plan to cut his fistula and bleed to death. He denies HI, or visual/auditory hallucinations. He states that he cannot take care of himself. He states that he does not want dialysis and does not want to eat. He otherwise  appears well on exam and his vital signs are unremarkable here. Will get screening labwork.  I filled out IVC paperwork as the patient is actively suicidal and refusing dialysis.   Pt found to have hyperkalemia, K is 7.3. Will tx w/ bicarb, Ca gluc, ins/dext.   The patient will be admitted to Internal Medicine Teaching service. I also consulted Renal for HD. Any medications given in the ED during this visit are listed below:  Medications  diphenhydrAMINE (BENADRYL) capsule 25 mg (25 mg Oral Not Given 01/14/14 1023)  sodium bicarbonate injection 50 mEq (not administered)  calcium gluconate 1 g in sodium chloride 0.9 % 100 mL IVPB (1 g Intravenous New Bag/Given 01/14/14 1140)  insulin aspart (novoLOG) injection 5 Units (not administered)  dextrose 50 % solution 50 mL (not administered)  hydrOXYzine (ATARAX/VISTARIL) tablet 25 mg (25 mg Oral Given 01/14/14 1032)        Junius Argyle, MD 01/14/14 1153

## 2014-01-14 NOTE — Progress Notes (Signed)
tx d/c'd early d/t pt behavior, Dr. Marisue Humble aware

## 2014-01-14 NOTE — BH Assessment (Signed)
[facility] Assessment Progress Note   Update:  Received another call for TTS tele assessment for pt.  Spoke with Deanna Artis, pt's nurse @ [phone], who stated pt is being medically admitted.  Informed her that pt would not be seen by TTS in ED, but would be seen on medical floor as  A psychiatry consult.    Casimer Lanius, MS, [facility] Licensed Professional Counselor Triage Specialist

## 2014-01-14 NOTE — Consult Note (Signed)
[facility] Renal Consultation Note  Indication for Consultation:  Management of ESRD/hemodialysis; anemia, hypertension/volume and secondary hyperparathyroidism  HPI: Carl Benson is a 58 y.o. male with ESRD on dialysis on TTS at [facility], COPD, liver cirrhosis with chronic ascites, and medical noncompliance secondary to his unstable social situation who was last hospitalized 2/27 - 3/4 with recurrent severe depression, suicidal thoughts, and PSTD and returned to the ED today stating that he came to die, refusing dialysis, and wanted to avoid dying alone in a hotel room or on the street.  He had not had dialysis since he was in-patient on 3/3, and his potassium is currently 7.3.  He stated that he is currently homeless and needed to receive his care with the [place], although he missed his initial appointment with the [facility] yesterday.  [facility] with the [facility] present and agreed to contact him on Monday 3/9 about his case.  He then agreed to treatment until he received their response on Monday and will have dialysis today.   Dialysis Orders:  TTS @ [facility] 4 hrs       55.5 kg     2K/2.25Ca       400/A1.5      Heparin 3000 U    AVG @ RUA  Hectorol 2 mcg         Epogen 2800 U          Venofer 50 mg on Thurs  Past Medical History  Diagnosis Date  . COPD (chronic obstructive pulmonary disease)   . Hypertension   . Hep C w/ coma, chronic   . Irregular heartbeat   . ESRD (end stage renal disease) on dialysis     /notes 11/11/2013  . Smoker unmotivated to quit   . Active smoker   . PTSD (post-traumatic stress disorder)   . Shortness of breath    Past Surgical History  Procedure Laterality Date  . Paracentesis  ~ 10/2013    Archie Endo 11/11/2013   Family History  Problem Relation Age of Onset  . Heart disease Mother   . Hypertension Mother   . Diabetes Mother    Social History  He has smoked a pack of cigarettes a day since he was 58 years old. He  states that he previously drank alcohol and used illicit drugs, including cocaine and marijuana, and quit six months ago, but "binged" on crack cocaine over the last two days.  No Known Allergies Prior to Admission medications   Not on File   Labs:  Results for orders placed during the hospital encounter of 01/14/14 (from the past 48 hour(s))  CBC WITH DIFFERENTIAL     Status: Abnormal   Collection Time    01/14/14  8:26 AM      Result Value Ref Range   WBC 10.3  4.0 - 10.5 K/uL   RBC 4.29  4.22 - 5.81 MIL/uL   Hemoglobin 11.3 (*) 13.0 - 17.0 g/dL   HCT 32.9 (*) 39.0 - 52.0 %   MCV 76.7 (*) 78.0 - 100.0 fL   MCH 26.3  26.0 - 34.0 pg   MCHC 34.3  30.0 - 36.0 g/dL   RDW 22.9 (*) 11.5 - 15.5 %   Platelets 243  150 - 400 K/uL   Comment: PLATELET COUNT CONFIRMED BY SMEAR   Neutrophils Relative % 71  43 - 77 %   Lymphocytes Relative 21  12 - 46 %   Monocytes Relative  7  3 - 12 %   Eosinophils Relative 0  0 - 5 %   Basophils Relative 1  0 - 1 %   Neutro Abs 7.3  1.7 - 7.7 K/uL   Lymphs Abs 2.2  0.7 - 4.0 K/uL   Monocytes Absolute 0.7  0.1 - 1.0 K/uL   Eosinophils Absolute 0.0  0.0 - 0.7 K/uL   Basophils Absolute 0.1  0.0 - 0.1 K/uL   RBC Morphology POLYCHROMASIA PRESENT     Comment: TARGET CELLS     ELLIPTOCYTES   WBC Morphology ATYPICAL LYMPHOCYTES     Smear Review LARGE PLATELETS PRESENT    PRO B NATRIURETIC PEPTIDE     Status: Abnormal   Collection Time    01/14/14  8:26 AM      Result Value Ref Range   Pro B Natriuretic peptide (BNP) >70000.0 (*) 0 - 125 pg/mL  COMPREHENSIVE METABOLIC PANEL     Status: Abnormal   Collection Time    01/14/14 10:01 AM      Result Value Ref Range   Sodium 130 (*) 137 - 147 mEq/L   Potassium 7.3 (*) 3.7 - 5.3 mEq/L   Comment: NO VISIBLE HEMOLYSIS     CRITICAL RESULT CALLED TO, READ BACK BY AND VERIFIED WITH:     REID K.,RN 01/14/14 1050 BY JONESJ   Chloride 87 (*) 96 - 112 mEq/L   CO2 17 (*) 19 - 32 mEq/L   Glucose, Bld 85  70 - 99 mg/dL    BUN 90 (*) 6 - 23 mg/dL   Creatinine, Ser 8.16 (*) 0.50 - 1.35 mg/dL   Calcium 9.2  8.4 - 10.5 mg/dL   Total Protein 7.6  6.0 - 8.3 g/dL   Albumin 3.1 (*) 3.5 - 5.2 g/dL   AST 75 (*) 0 - 37 U/L   ALT 50  0 - 53 U/L   Alkaline Phosphatase 244 (*) 39 - 117 U/L   Total Bilirubin 1.9 (*) 0.3 - 1.2 mg/dL   GFR calc non Af Amer 6 (*) >90 mL/min   GFR calc Af Amer 7 (*) >90 mL/min   Comment: (NOTE)     The eGFR has been calculated using the CKD EPI equation.     This calculation has not been validated in all clinical situations.     eGFR's persistently <90 mL/min signify possible Chronic Kidney     Disease.  ETHANOL     Status: None   Collection Time    01/14/14 10:01 AM      Result Value Ref Range   Alcohol, Ethyl (B) <11  0 - 11 mg/dL   Comment:            LOWEST DETECTABLE LIMIT FOR     SERUM ALCOHOL IS 11 mg/dL     FOR MEDICAL PURPOSES ONLY  ACETAMINOPHEN LEVEL     Status: None   Collection Time    01/14/14 10:01 AM      Result Value Ref Range   Acetaminophen (Tylenol), Serum <15.0  10 - 30 ug/mL   Comment:            THERAPEUTIC CONCENTRATIONS VARY     SIGNIFICANTLY. A RANGE OF 10-30     ug/mL MAY BE AN EFFECTIVE     CONCENTRATION FOR MANY PATIENTS.     HOWEVER, SOME ARE BEST TREATED     AT CONCENTRATIONS OUTSIDE THIS     RANGE.     ACETAMINOPHEN CONCENTRATIONS     >  150 ug/mL AT 4 HOURS AFTER     INGESTION AND >50 ug/mL AT 12     HOURS AFTER INGESTION ARE     OFTEN ASSOCIATED WITH TOXIC     REACTIONS.  SALICYLATE LEVEL     Status: Abnormal   Collection Time    01/14/14 10:01 AM      Result Value Ref Range   Salicylate Lvl <3.7 (*) 2.8 - 20.0 mg/dL   Constitutional: positive for pruritis, fatigue; negative for chills, fevers and sweats Ears, nose, mouth, throat, and face: negative for earaches, hoarseness, nasal congestion and sore throat Respiratory: negative for cough, dyspnea on exertion, hemoptysis and sputum Cardiovascular: negative for chest pain, chest  pressure/discomfort, dyspnea, orthopnea and palpitations Gastrointestinal: positive for abdominal pain, negative for change in bowel habits, nausea and vomiting Genitourinary:negative, anuric Musculoskeletal:negative for arthralgias, back pain, myalgias and neck pain Neurological: positive for weakness, negative for dizziness, headaches and speech problems  Physical Exam: Filed Vitals:   01/14/14 0659  BP: 138/100  Pulse: 91  Temp: 98 F (36.7 C)  Resp: 20     General appearance: alert, initially uncooperative and verbally abusive Head: Normocephalic, without obvious abnormality, atraumatic Neck: no adenopathy, no carotid bruit, no JVD and supple, symmetrical, trachea midline Resp: clear to auscultation bilaterally Cardio: regular rate and rhythm, S1, S2 normal, no murmur, click, rub or gallop GI: soft, non-tender; bowel sounds normal; mild ascites, no masses, no organomegaly Extremities: extremities normal, atraumatic, no cyanosis or edema Neurologic: Grossly normal Dialysis Access: AVG @ RUA with + bruit   Assessment/Plan: 1. Suicidal ideation - recent admission for depression & PTSD, left AMA 3/4; returned today, initially refusing Tx, including HD.  [facility], and pt agreed to be treated before [place] contacts him on Mon 3/9. 2. Hyperkalemia - K 7.3 today, sec to missed HD.  HD pending. 3. ESRD - HD normally TTS @ [facility], last Tx on 3/4 in hospital.   4. Hypertension/volume - BP 138/100, outpatient meds include Amlodipine, Metoprolol, & Prazosin, but not taking; below EDW.  5. Anemia - Hgb 11.3 on outpatient Epogen & weekly Venofer. 6. Metabolic bone disease - Ca 9.2 (9.9 corrected), P 4.2; Hectorol 2 mcg, Phoslo 2 with meals. 7. Nutrition - Alb 3.1 8. Liver cirrhosis - with chronic ascites due to Hepatitis C, repeated paracenteses. 9. PAF - on BB & ASA, no Coumadin. 10. COPD - long smoking Hx.  Loucile Posner 01/14/2014, 1:58 PM   Attending Nephrologist:   Pearson Grippe, MD

## 2014-01-15 DIAGNOSIS — N186 End stage renal disease: Secondary | ICD-10-CM

## 2014-01-15 DIAGNOSIS — J449 Chronic obstructive pulmonary disease, unspecified: Secondary | ICD-10-CM

## 2014-01-15 DIAGNOSIS — J189 Pneumonia, unspecified organism: Secondary | ICD-10-CM

## 2014-01-15 DIAGNOSIS — I4891 Unspecified atrial fibrillation: Secondary | ICD-10-CM

## 2014-01-15 DIAGNOSIS — K7460 Unspecified cirrhosis of liver: Secondary | ICD-10-CM

## 2014-01-15 DIAGNOSIS — R45851 Suicidal ideations: Secondary | ICD-10-CM

## 2014-01-15 DIAGNOSIS — I120 Hypertensive chronic kidney disease with stage 5 chronic kidney disease or end stage renal disease: Secondary | ICD-10-CM

## 2014-01-15 DIAGNOSIS — I252 Old myocardial infarction: Secondary | ICD-10-CM

## 2014-01-15 DIAGNOSIS — I2510 Atherosclerotic heart disease of native coronary artery without angina pectoris: Secondary | ICD-10-CM

## 2014-01-15 DIAGNOSIS — E871 Hypo-osmolality and hyponatremia: Secondary | ICD-10-CM

## 2014-01-15 DIAGNOSIS — N2581 Secondary hyperparathyroidism of renal origin: Secondary | ICD-10-CM

## 2014-01-15 DIAGNOSIS — D509 Iron deficiency anemia, unspecified: Secondary | ICD-10-CM

## 2014-01-15 DIAGNOSIS — D696 Thrombocytopenia, unspecified: Secondary | ICD-10-CM

## 2014-01-15 DIAGNOSIS — E41 Nutritional marasmus: Secondary | ICD-10-CM

## 2014-01-15 DIAGNOSIS — E872 Acidosis, unspecified: Secondary | ICD-10-CM

## 2014-01-15 DIAGNOSIS — F4310 Post-traumatic stress disorder, unspecified: Secondary | ICD-10-CM

## 2014-01-15 DIAGNOSIS — F332 Major depressive disorder, recurrent severe without psychotic features: Principal | ICD-10-CM

## 2014-01-15 LAB — BASIC METABOLIC PANEL
BUN: 38 mg/dL — AB (ref 6–23)
CALCIUM: 9 mg/dL (ref 8.4–10.5)
CO2: 22 mEq/L (ref 19–32)
Chloride: 97 mEq/L (ref 96–112)
Creatinine, Ser: 4.44 mg/dL — ABNORMAL HIGH (ref 0.50–1.35)
GFR calc Af Amer: 15 mL/min — ABNORMAL LOW (ref >90)
GFR, EST NON AFRICAN AMERICAN: 13 mL/min — AB (ref >90)
GLUCOSE: 100 mg/dL — AB (ref 70–99)
Potassium: 4.8 mEq/L (ref 3.7–5.3)
Sodium: 138 mEq/L (ref 137–147)

## 2014-01-15 LAB — CBC
HCT: 28 % — ABNORMAL LOW (ref 39.0–52.0)
HEMOGLOBIN: 9.6 g/dL — AB (ref 13.0–17.0)
MCH: 26.2 pg (ref 26.0–34.0)
MCHC: 34.3 g/dL (ref 30.0–36.0)
MCV: 76.3 fL — ABNORMAL LOW (ref 78.0–100.0)
PLATELETS: 130 10*3/uL — AB (ref 150–400)
RBC: 3.67 MIL/uL — ABNORMAL LOW (ref 4.22–5.81)
RDW: 22 % — ABNORMAL HIGH (ref 11.5–15.5)
WBC: 6.6 10*3/uL (ref 4.0–10.5)

## 2014-01-15 MED ORDER — DILTIAZEM HCL 30 MG PO TABS
30.0000 mg | ORAL_TABLET | Freq: Three times a day (TID) | ORAL | Status: AC
  Administered 2014-01-15 – 2014-01-16 (×5): 30 mg via ORAL
  Filled 2014-01-15 (×6): qty 1

## 2014-01-15 MED ORDER — DIPHENHYDRAMINE HCL 25 MG PO CAPS
25.0000 mg | ORAL_CAPSULE | Freq: Once | ORAL | Status: AC
  Administered 2014-01-16: 25 mg via ORAL
  Filled 2014-01-15: qty 1

## 2014-01-15 NOTE — Progress Notes (Signed)
1000 01/15/14 nsg MD notified of HR 140-150 due med given; continued to monitor.

## 2014-01-15 NOTE — Progress Notes (Addendum)
Subjective:  Pt seen and examined in AM. No acute events overnight. Pt ended HD yesterday 30 minutes early after becoming verbally abusive to staff. Pt is sleep and denies any complaints. Pt with heart rate in the AM that was up in the 140's and possible atrial fibrillation vs flutter on telemetry.    Objective: Vital signs in last 24 hours: Filed Vitals:   01/14/14 2054 01/15/14 0622 01/15/14 0840 01/15/14 1105  BP: 149/82 111/77 98/69   Pulse: 119 120 89   Temp: 99.6 F (37.6 C) 98.8 F (37.1 C) 98.4 F (36.9 C)   TempSrc: Oral Axillary Axillary   Resp: 20 17 20    Height: 5\' 5"  (1.651 m)     Weight: 50.4 kg (111 lb 1.8 oz)     SpO2: 95% 100% 100% 97%   Weight change:   Intake/Output Summary (Last 24 hours) at 01/15/14 1114 Last data filed at 01/15/14 [id number]  Gross per 24 hour  Intake   98.8 ml  Output   2529 ml  Net -2430.2 ml   Physical Exam:  General appearance:  Malnourished , sleeping but arousable to voice and touch  Resp: Clear to ausculation in anterior fields   Cardio: tachycardic, regular rhythm   GI: normal BS , soft and non-tender, mild distension  Extremities: No edema Psych: cooperative  Lab Results: Basic Metabolic Panel:  Recent Labs Lab 01/10/14 0330  01/14/14 1500 01/15/14 0610  NA 137  < > 132* 138  K 4.2  < > 6.8* 4.8  CL 95*  < > 88* 97  CO2 24  < > 16* 22  GLUCOSE 80  < > 120* 100*  BUN 40*  < > 95* 38*  CREATININE 4.58*  < > 8.55* 4.44*  CALCIUM 8.9  < > 9.0 9.0  PHOS 4.2  --  9.8*  --   < > = values in this interval not displayed. Liver Function Tests:  Recent Labs Lab 01/14/14 1001 01/14/14 1500  AST 75*  --   ALT 50  --   ALKPHOS 244*  --   BILITOT 1.9*  --   PROT 7.6  --   ALBUMIN 3.1* 3.0*   CBC:  Recent Labs Lab 01/14/14 0826 01/14/14 1500 01/15/14 0610  WBC 10.3 9.1 6.6  NEUTROABS 7.3  --   --   HGB 11.3* 10.6* 9.6*  HCT 32.9* 30.3* 28.0*  MCV 76.7* 76.3* 76.3*  PLT 243 145* 130*  BNP:  Recent  Labs Lab 01/14/14 0826  PROBNP >70000.0*  Alcohol Level:  Recent Labs Lab 01/14/14 1001  ETH <11    Studies/Results: Dg Chest 2 View  01/14/2014   CLINICAL DATA:  History of smoking and hypertension. Evaluate for CHF exacerbation.  EXAM: CHEST  2 VIEW  COMPARISON:  01/06/2014  FINDINGS: Two views of the chest demonstrate mild cardiomegaly. Central vascular structures are prominent but no overt pulmonary edema. No evidence for pleural effusions. Bony thorax is intact. Old right clavicle fracture.  IMPRESSION: Mild cardiomegaly with vascular congestion. No evidence for overt pulmonary edema.   Electronically Signed   By: Richarda OverlieAdam  Henn M.D.   On: 01/14/2014 09:05   Medications: I have reviewed the patient's current medications. Scheduled Meds: . aspirin  81 mg Oral Daily  . diphenhydrAMINE  25 mg Oral Once  . doxercalciferol  2 mcg Intravenous Q T,Th,Sa-HD  . folic acid  1 mg Oral Daily  . heparin  3,000 Units Dialysis Once in dialysis  .  heparin  5,000 Units Subcutaneous 3 times per day  . lactulose  10 g Oral TID  . metoprolol tartrate  12.5 mg Oral BID  . QUEtiapine  50 mg Oral QHS  . sertraline  25 mg Oral Daily  . sodium chloride  3 mL Intravenous Q12H  . tiotropium  18 mcg Inhalation Daily  . traZODone  100 mg Oral QHS   Continuous Infusions:  PRN Meds:.sodium chloride, sodium chloride, sodium chloride, acetaminophen, acetaminophen, albuterol, benzonatate, calcium carbonate (dosed in mg elemental calcium), camphor-menthol, docusate sodium, feeding supplement (NEPRO CARB STEADY), feeding supplement (NEPRO CARB STEADY), guaiFENesin, heparin, hydrOXYzine, lidocaine (PF), lidocaine-prilocaine, ondansetron (ZOFRAN) IV, ondansetron, pentafluoroprop-tetrafluoroeth, sorbitol Assessment/Plan:  Assessment: 58 year old with ESRD on HD, decompensated liver cirrhosis, COPD, HTN, and AFib (not on coumadin), and PTSD with multiple ED admissions in the past 3 months who presented on 3/7 with  active suicidal thoughts.  Plan:   Recurrent Severe Major Depression and PTSD with Suicidal Ideation - currently with active suicidal ideation and plan (to starve and refuse HD) and labile mood. Pt placed under IVC in ED. Pt previously hospitalized from 2/27- 3/4 and started on sertraline 25 mg daily and quetiapine 50 mg daily. Pt was to follow up with outpatient psychiatry in [facility] on 3/6 but did not. Pt with reported binge on crack cocaine over the last two days.  -Psychiatry consult (to see pt today or tomm)  -Pt placed under IVC due to SI  -Psych SW assistance for transfer to inpatient psychiatric treatment  -Continue previously started sertraline 25 mg daily and quetiapine 50 mg daily at bedtime per psychiatry recommendations, monitor 12-lead EKG due to prolonged QT.   -D/C trazadone 50 mg daily at bedtime due to increased sedation   -Sitter at bedside   Paroxysmal Atrial Fibrillation - Recurrent. HR in 130-140s with aflutter today.  CHADS score of 1 (HTN) on daily AP therapy. [facility] he was previously on diltiazem ER 120 mg daily and for a period of time on coumadin for [facility] therapy. -Continue 81 mg aspirin daily  -Start 30 mg diltiazem TID, hold if SBP< 90   -D/C metoprolol tartrate 12.5 mg BID in setting of cocaine use (previously on metoprolol 50 mg daily)  -Monitor on telemetry   Hypertension - Currently normotensive.   -D/C metoprolol tartrate 12.5 mg BID due to cocaine abuse (previously on metoprolol 50 mg daily)  -Holding amlodipine 10 mg daily  -Start 30 mg diltiazem TID due to recurrent Afib, hold if SBP< 90    Hyperkalemia - resolved. Pt presented with K of 7.3.  Pt received bicarb, calcium gluconate, insulin and dextrose, and emergent HD. Etiology due to missing HD sessions (last on 3/4).  -Monitor BMP  -Obtain troponin  -Monitor on telemetry   ESRD - Pt on TTS schedule at [facility]. HD on 3/7l.   -Renal diet  -Nephrology following, on TTS schedule   -Continue renavite daily and phoslo 1334 TID with meals  -Monitor daily weight and volume status   COPD - Pt currently with no acute exacerbation. Pt not on home oxygen, rescue or daily maintenance inhalers. Reports having PFT's at [place] in [place] and told he should be on home oxygen  -Albuterol nebulizer Q6hr PRN  -Spiriva daily for maintenance therapy   Recent HCAP - Pt was recently hospitalized and treated for HCAP from 2/11-2/15 and was to take Levaquin 250 mg Q 48 hr for 14 days however not able to afford medication. Pt  then hospitalized 2/20-2/23 and started on Augmentin which received for 3 days and to complete 7 day course. Chest xray on 3/7 unchanged with no leukocytosis, fever, productive cough, or dyspnea.  -Mucinex 600 mg BID PRN for cough  -Tessalon 100 mg PRN BID for cough   Decompensated Liver Cirrhosis - Pt with liver function testing on admission that appears at baseline. No active bleeding. Etiology most likely due to chronic hepatitis C infection. No fever, encephalopathy, significant leukocytosis, or worsening ascites to suggest SBP or warrant paracentesis.  -Continue lactulose 10g TID  -Continue hydroxyzine 25 mg Q 6hr PRN pruritis  -Continue folic acid 1 mg daily  -Phenergan PRN nausea (avoid zofran in setting of prolonged QT)   Hyperphosphatemia in setting of Secondary Hyperparathyroidism - Pt with corrected calcium of 9.9 and phosphorous 9.8 on 3/7. Etiology most likely secondary due to ESRD.  -Continue phoslo TID with meals  -Continue weekly hectoral with HD   Severe Malnutrition - Pt with chronic hypoalbuminemia in setting of ESRD and liver cirrhosis. Pt with evidence of severe muscle wasting and 8% weight loss in last month with BMI of 20.12  -Renal diet  -Nepro Shake po TID   Chronic Hypervolemic Hyponatremia - Pt with chronic hyponatremia in setting of volume overload due to liver cirrhosis and ESRD.  -Continue HD on TTS schedule  Chronic Prolonged QT  - Pt with QTc of 643 on 12/30/13, 475 on 3/7. Etiology likely due to medications vs chronic hypocalcemia in setting of secondary hyperparathyroidism.  -Avoid QT prolonging medications -Obtain 12-lead EKG daily while on quetiapine, however no established guidelines to stop if QT prolongs   Chronic Anion Gap Metabolic Acidosis-  Etiology most likely chronic due to hyperphosphatemia in setting of ESRD vs starvation ketoacidosis (poor PO intake as unable to afford food).  -Obtain renal function panel in AM  -Renal diet   Chronic Thrombocytopenia - Platlet count  above baseline of 100K. Pt with no recent active bleeding or bruising.  -Monitor for bleeding  -SQ heparin for DVT prophylaxis   Chronic Microcytic Anemia - Pt with Hg  near baseline of 10. Etiology likely due to CKD. Pt receives ESA and erythropoetin weekly at HD.  -Monitor for bleeding  -Transfuse if Hg <7   CAD - Pt with history of MI per [facility] records. Currently without CP.  -Continue 81 mg aspirin daily  -Due to cocaine use, hold BB   Social Issues - Currently living at extended stay hotel (?) and unable to afford any of his medications, however reported he would receive VA check March 1st. Pt was to [facility] with [facility] on March 6 but did not attend.  -Psych and inpatient SW consults   Diet: Renal  DVT Ppx: SQ heparin TID  Code: Full      Dispo: Disposition is deferred at this time, awaiting improvement of current medical problems.   The patient does not have a current PCP (No Pcp Per Patient) and does need an [facility] hospital follow-up appointment after discharge.  The patient does have transportation limitations that hinder transportation to clinic appointments.  .Services Needed at time of discharge: Y = Yes, Blank = No PT:   OT:   RN:   Equipment:   Other:     LOS: 1 day   Otis Brace, MD 01/15/2014, 11:14 AM

## 2014-01-15 NOTE — Progress Notes (Signed)
01/15/14 1533 nsg Pt refuses lab work. MD notified

## 2014-01-15 NOTE — Progress Notes (Signed)
Subjective:  Aroused from sleep, but denied any complaints; ended HD ~30 mins early yesterday after becoming verbally abusive to staff   Objective: Vital signs in last 24 hours: Temp:  [97.7 F (36.5 C)-99.6 F (37.6 C)] 98.4 F (36.9 C) (03/08 0840) Pulse Rate:  [84-120] 89 (03/08 0840) Resp:  [17-22] 20 (03/08 0840) BP: (98-189)/(69-113) 98/69 mmHg (03/08 0840) SpO2:  [95 %-100 %] 100 % (03/08 0840) Weight:  [50.4 kg (111 lb 1.8 oz)-55.6 kg (122 lb 9.2 oz)] 50.4 kg (111 lb 1.8 oz) (03/07 2054) Weight change:   Intake/Output from previous day: 03/07 0701 - 03/08 0700 In: 98.8 [P.O.:98.8] Out: 2529    Lab Results:  Recent Labs  01/14/14 1500 01/15/14 0610  WBC 9.1 6.6  HGB 10.6* 9.6*  HCT 30.3* 28.0*  PLT 145* 130*   BMET:  Recent Labs  01/14/14 1001 01/14/14 1500 01/15/14 0610  NA 130* 132* 138  K 7.3* 6.8* 4.8  CL 87* 88* 97  CO2 17* 16* 22  GLUCOSE 85 120* 100*  BUN 90* 95* 38*  CREATININE 8.16* 8.55* 4.44*  CALCIUM 9.2 9.0 9.0  ALBUMIN 3.1* 3.0*  --    No results found for this basename: PTH,  in the last 72 hours Iron Studies: No results found for this basename: IRON, TIBC, TRANSFERRIN, FERRITIN,  in the last 72 hours  EXAM: General appearance:  Chronically ill, sleeping, but passively cooperative Resp:  CTA without rales, rhonchi, or wheezes Cardio:  RRR without murmur or rub GI:  + BS, soft and nontender Extremities:  No edema Access:  ABG @ RUA with + bruit  Dialysis Orders: TTS @ [facility]  4 hrs 55.5 kg 2K/2.25Ca 400/A1.5 Heparin 3000 U AVG @ RUA  Hectorol 2 mcg Epogen 2800 U Venofer 50 mg on Thurs   Assessment/Plan: 1. Suicidal ideation - recent admission for depression & PTSD, left AMA 3/4; returned yesterday, initially refusing Tx, including HD; agreed to Tx after [facility] crisis line was called and agreed to contact pt on Mon 3/9.  Not sure what the endpoint is here.  They were considering inpatient psych admit last time which I think is a good  idea 2. Hyperkalemia - K 7.3 yesterday, sec to missed HD, 4.8 today s/p HD. 3. ESRD - HD normally TTS @ [facility], no Tx between 3/4 in hospital and yesterday, now stable.  Next HD 3/10.  4. Hypertension/volume - BP 98/69, outpatient meds include Amlodipine, Metoprolol, & Prazosin, but not taking; now on Metoprolol 12.5 mg bid; wt 50.4 kg s/p net UF 2.5 L yesterday, below EDW. If he would come regularly to HD probably would not need BP meds  5. Anemia - Hgb down to 9.6, on outpatient Epogen & weekly Venofer. Aranesp with HD 3/10, if he stays. 6. Metabolic bone disease - Ca 9 (9.8 corrected), P 9.8; Hectorol 2 mcg, Phoslo 2 with meals.  7. Nutrition - Alb 3, renal diet.  8. Liver cirrhosis - with chronic ascites due to Hepatitis C, repeated paracenteses.  9. PAF - on BB & ASA, no Coumadin.  10. COPD - long smoking Hx.     LOS: 1 day   Carl Benson,Carl Benson 01/15/2014,9:40 AM  Patient seen and examined, agree with above note with above modifications. Very difficult situation.  Much of his hospitalizations could be avoided if he were mentally /socially stable to be able to attend his HD 3 times a week.  The [facility] is supposed to contact pt tomorrow but not sure what  they can offer.  I would pursue inpatient psych admit again.  Will plan for HD next on Tuesday, his regular day.   Annie Sable, MD 01/15/2014

## 2014-01-15 NOTE — H&P (Signed)
INTERNAL MEDICINE TEACHING ATTENDING NOTE  Day 1 of stay  Patient name: Carl Benson  MRN: [id number] Date of birth: [birth year]   58 y.o.with ESRD on HD, war veteran with depression and PTSD, PAF not on coumadin, cirrhosis coming in with hyperkalemia after missing dialysis and admitting to suicidal ideation. The patient was immediately dialyzed and admitted to the [facility] ward with a sitter. He revealed that he had been doing cocaine.  When I saw him this morning, he was medicated with atarax, seroquel, trazodone and sertraline from last night and very sleepy. He did not seem to be in any pain. His exam was significant for regular tachycardia while sleeping upto 150 bpm which came down to 120s with regular dose of metoprolol 12.5. His blood pressure is low normal which is how he is after dialysis usually.  Labs and EKG reviewed.   Hyperkalemia is resolved. The patient continues to be with [place] for unresolved SI (psych needs to give input), reemerged rapid atrial fibrillation (on metoprolol, however needs to be switched to CCB given cocaine use, however low normal blood pressure post HD is the limiting factor - cards consult to be done).   The patient was restarted on seroquel which was his psych medicine before, however he has history of prolonged QT  (643 on 01/09/14, normal now). Seroquel might not be an ideal choice for him. He needs to be QT monitored (daily EKG) while on Seroquel. I would ask for psych recommendation for medication management and need for seroquel replacement. Also, the patient seems to be overly sedated with sertraline, trazodone, seroquel and atarax on board. To top that, he is also on [place]!! I discussed this with Dr Garald Braver and [place] to discontinue ambien and trazodone.    I have seen and evaluated this patient and discussed it with my IM resident team.  Please see the rest of the plan per resident note from today.   Regina Ganci 01/15/2014, 11:56 AM.

## 2014-01-16 MED ORDER — NEPRO/CARBSTEADY PO LIQD
237.0000 mL | Freq: Two times a day (BID) | ORAL | Status: DC
  Administered 2014-01-16 – 2014-01-30 (×18): 237 mL via ORAL

## 2014-01-16 MED ORDER — TRAZODONE HCL 100 MG PO TABS
100.0000 mg | ORAL_TABLET | Freq: Every day | ORAL | Status: DC
  Administered 2014-01-16 – 2014-01-31 (×16): 100 mg via ORAL
  Filled 2014-01-16 (×17): qty 1

## 2014-01-16 MED ORDER — CALCIUM ACETATE 667 MG PO CAPS
1334.0000 mg | ORAL_CAPSULE | Freq: Three times a day (TID) | ORAL | Status: DC
  Administered 2014-01-16 – 2014-01-25 (×20): 1334 mg via ORAL
  Filled 2014-01-16 (×29): qty 2

## 2014-01-16 MED ORDER — DILTIAZEM HCL ER COATED BEADS 120 MG PO CP24
120.0000 mg | ORAL_CAPSULE | Freq: Every day | ORAL | Status: DC
  Administered 2014-01-17 – 2014-01-31 (×15): 120 mg via ORAL
  Filled 2014-01-16 (×16): qty 1

## 2014-01-16 NOTE — Progress Notes (Signed)
  Date: 01/16/2014  Patient name: Carl Benson  Medical record number: [id number]  Date of birth: [birth year]   This patient has been seen and the plan of care was discussed with the house staff. Please see their note for complete details. I concur with their findings with the following additions/corrections: Unfortunate situation. The patient is requesting treatment for his psychiatric condition at the [place] and was not able to go there in person. He had clear suicidal ideation on admission. At this time, though he is medically stable, he is not stable for a psychiatric perspective. I recommend the housestaff perform a peer-to-peer call to a nearby [place] and obtain an accepting physician. The patient is requesting transfer.  Nephrology is following for HD. He had skipped HD as outpatient. He needs an official psychiatric consultation before transfer.  Jonah Blue, DO, FACP Faculty [facility] Internal Medicine Residency Program 01/16/2014, 2:10 PM

## 2014-01-16 NOTE — Progress Notes (Signed)
Subjective:   No voiced cos/ sitter in room/ for hd in am Objective Vital signs in last 24 hours: Filed Vitals:   01/15/14 2033 01/16/14 0848 01/16/14 1003 01/16/14 1034  BP: 123/72 148/92 154/92   Pulse: 100 82    Temp: 98.5 F (36.9 C) 98.1 F (36.7 C)    TempSrc: Oral Oral    Resp: 22 20    Height: 5\' 5"  (1.651 m)     Weight: 52.8 kg (116 lb 6.5 oz)     SpO2: 100% 99%  98%   Weight change: -2.8 kg (-6 lb 2.8 oz)  Labs: Basic Metabolic Panel:  Recent Labs Lab 01/10/14 0330 01/14/14 1001 01/14/14 1500 01/15/14 0610  NA 137 130* 132* 138  K 4.2 7.3* 6.8* 4.8  CL 95* 87* 88* 97  CO2 24 17* 16* 22  GLUCOSE 80 85 120* 100*  BUN 40* 90* 95* 38*  CREATININE 4.58* 8.16* 8.55* 4.44*  CALCIUM 8.9 9.2 9.0 9.0  PHOS 4.2  --  9.8*  --     Recent Labs Lab 01/10/14 0330 01/14/14 1001 01/14/14 1500  AST  --  75*  --   ALT  --  50  --   ALKPHOS  --  244*  --   BILITOT  --  1.9*  --   PROT  --  7.6  --   ALBUMIN 2.3* 3.1* 3.0*  CBC:  Recent Labs Lab 01/14/14 0826 01/14/14 1500 01/15/14 0610  WBC 10.3 9.1 6.6  NEUTROABS 7.3  --   --   HGB 11.3* 10.6* 9.6*  HCT 32.9* 30.3* 28.0*  MCV 76.7* 76.3* 76.3*  PLT 243 145* 130*   Medications:   . aspirin  81 mg Oral Daily  . diltiazem  30 mg Oral 3 times per day  . doxercalciferol  2 mcg Intravenous Q T,Th,Sa-HD  . folic acid  1 mg Oral Daily  . heparin  3,000 Units Dialysis Once in dialysis  . heparin  5,000 Units Subcutaneous 3 times per day  . lactulose  10 g Oral TID  . QUEtiapine  50 mg Oral QHS  . sertraline  25 mg Oral Daily  . sodium chloride  3 mL Intravenous Q12H  . tiotropium  18 mcg Inhalation Daily  . traZODone  100 mg Oral QHS    Physical Exam: General: alert nad Resp: CTA  bilat no  rales, rhonchi, or wheezes  Cardio: RRR , no murmur or rub  GI: + BS, soft , nontender , ? Trace acites Extremities: No pedal edema  Access: ABG @ RUA with + bruit   Dialysis Orders: TTS @ [facility]  4 hrs 55.5 kg  2K/2.25Ca 400/A1.5 Heparin 3000 U AVG @ RUA  Hectorol 2 mcg Epogen 2800 U Venofer 50 mg on Thurs   Assessment/Plan:  1. Suicidal ideation - recent admission for depression & PTSD, left AMA 3/4; returned 3/07, initially refusing Tx, including HD; agreed to Tx after [facility] crisis line was called and agreed to contact pt on Mon 3/9. Psy md to see today per CSW notes  2. Hyperkalemia - K 7.3 3/07  sec to missed HD, 4.8 yesterday s/p HD. 3. ESRD - HD normally TTS @ [facility], no Tx between 3/4 in hospital and 3/07 now stable. Next HD 3/10.  4. Hypertension/volume - BP 148/92 , with  outpatient meds include Amlodipine, Metoprolol, & Prazosin, but not taking; now on Diltiazem 30mg  tid ( if he takes it) wt 52.8kg , below EDW/  HD in am 2 to 3 liter as tolerates/ lower edw at dc 5. Anemia - Hgb down to 9.6, on outpatient Epogen & weekly Venofer. Aranesp with HD 3/10, if he stays. 6. Metabolic bone disease - Ca 9 (9.8 corrected), P 9.8; Hectorol 2 mcg, Phoslo 2 with meals.needs compliance  With binders and diet  7. Nutrition - Alb 3, renal diet.  8. Liver cirrhosis - with chronic ascites due to Hepatitis C, admit team rx 9. PAF - on BB & ASA, no Coumadin.  10. COPD - long smoking Hx   Carl Pastelavid Zeyfang, PA-C [facility] [address] [phone] 01/16/2014,10:41 AM  LOS: 2 days   Pt seen, examined and agree w A/P as above.  [name] Masako Overall MD pager [phone]    cell [phone] 01/16/2014, 3:45 PM

## 2014-01-16 NOTE — Consult Note (Signed)
Reason for Consult: depression and ptsd and suicidal thoughts Referring Physician: Dr. [name] is an 59 y.o. male.  HPI: Patient was seen and chart reviewed patient was known to this provider from his previous psychiatric consultation during his recent hospitalization from February 27 to March 4 for depression and posttraumatic stress disorder. Patient was readmitted with a complaints of depression anxiety with flashbacks about his previous trauma, suicidal ideation. Patient also reported he was messing up because he could not go to the psychiatric clinic scheduled appointment and somebody came to this case and offered cocaine which is his drug of abuse and he could not say no. Patient stated he spent the money he has 4 rent, cloths and for cocaine. Patient stated he went to a nearby store and somewhat dizzy so is total in is called the EMS and then he told them he was depressed and suicidal he needed to be taken to the emergency department. Patient requests that he needed to be seen psychiatrists and need to be hospitalized for his history of depression, posttraumatic stress disorder and substance abuse. Patient is worried that he may not able to get his funds until 23rd of this month. Patient also reported he does not want to die with end-stage renal disease and hemodialysis. Patient also reported he has a little boy in his head telling him to run into the traffic or kill himself.   Mental Status Examination: Patient appeared as per his stated age, and fairly groomed, and has fair eye contact. Patient has depressed and anxious mood and his affect was irritable. He has normal rate, rhythm, and volume of speech. His thought process is linear and goal directed. Patient has suicidal, and denied homicidal ideations, intentions or plans. Patient has no evidence of auditory or visual hallucinations, delusions, and paranoia. Patient has fair insight judgment and impulse control.  Past Medical  History  Diagnosis Date  . COPD (chronic obstructive pulmonary disease)   . Hypertension   . Hep C w/ coma, chronic   . Irregular heartbeat   . ESRD (end stage renal disease) on dialysis     /notes 11/11/2013  . Smoker unmotivated to quit   . Active smoker   . PTSD (post-traumatic stress disorder)   . Shortness of breath     Past Surgical History  Procedure Laterality Date  . Paracentesis  ~ 10/2013    Archie Endo 11/11/2013    Family History  Problem Relation Age of Onset  . Heart disease Mother   . Hypertension Mother   . Diabetes Mother     Social History:  reports that he has been smoking Cigarettes.  He has a 7.5 pack-year smoking history. He has never used smokeless tobacco. He reports that he uses illicit drugs ("Crack" cocaine, Cocaine, Marijuana, and Other-see comments). He reports that he does not drink alcohol.  Allergies: No Known Allergies  Medications: I have reviewed the patient's current medications.  Results for orders placed during the hospital encounter of 01/14/14 (from the past 48 hour(s))  BASIC METABOLIC PANEL     Status: Abnormal   Collection Time    01/15/14  6:10 AM      Result Value Ref Range   Sodium 138  137 - 147 mEq/L   Potassium 4.8  3.7 - 5.3 mEq/L   Comment: HEMOLYSIS AT THIS LEVEL MAY AFFECT RESULT     DELTA CHECK NOTED   Chloride 97  96 - 112 mEq/L   CO2 22  19 - 32 mEq/L   Glucose, Bld 100 (*) 70 - 99 mg/dL   BUN 38 (*) 6 - 23 mg/dL   Comment: DELTA CHECK NOTED   Creatinine, Ser 4.44 (*) 0.50 - 1.35 mg/dL   Comment: DELTA CHECK NOTED   Calcium 9.0  8.4 - 10.5 mg/dL   GFR calc non Af Amer 13 (*) >90 mL/min   GFR calc Af Amer 15 (*) >90 mL/min   Comment: (NOTE)     The eGFR has been calculated using the CKD EPI equation.     This calculation has not been validated in all clinical situations.     eGFR's persistently <90 mL/min signify possible Chronic Kidney     Disease.  CBC     Status: Abnormal   Collection Time    01/15/14  6:10  AM      Result Value Ref Range   WBC 6.6  4.0 - 10.5 K/uL   RBC 3.67 (*) 4.22 - 5.81 MIL/uL   Hemoglobin 9.6 (*) 13.0 - 17.0 g/dL   HCT 28.0 (*) 39.0 - 52.0 %   MCV 76.3 (*) 78.0 - 100.0 fL   MCH 26.2  26.0 - 34.0 pg   MCHC 34.3  30.0 - 36.0 g/dL   RDW 22.0 (*) 11.5 - 15.5 %   Platelets 130 (*) 150 - 400 K/uL   Comment: PLATELET COUNT CONFIRMED BY SMEAR    No results found.  Positive for anxiety, bad mood, depression, illegal drug usage, mood swings and sleep disturbance Blood pressure 152/96, pulse 80, temperature 98.2 F (36.8 C), temperature source Oral, resp. rate 18, height $RemoveBe'5\' 5"'bKexnOgiZ$  (1.651 m), weight 52.8 kg (116 lb 6.5 oz), SpO2 98.00%.   Assessment/Plan: Posttraumatic stress disorder Cocaine abuse versus dependence  Recommendation:  Recommended acute psychiatric hospitalization for crisis stabilization, safety monitoring and medication management Patient will be referred to be [facility] in [place] as patient requested because he has 100% disability benefits We'll continue his current medication management, Zoloft 25 mg daily for depression and anxiety, Seroquel 50 mg at bedtime for agitation and trazodone 100 mg at bedtime for insomnia  Continue safety sitter Appreciate psychiatric consultation and followup as clinically required  Ahniya Mitchum,JANARDHAHA R. 01/16/2014, 3:39 PM

## 2014-01-16 NOTE — Progress Notes (Signed)
INITIAL NUTRITION ASSESSMENT  DOCUMENTATION CODES Per approved criteria  -Severe malnutrition in the context of chronic illness   INTERVENTION: Change Nepro Shake to po BID, each supplement provides 425 kcal and 19 grams protein. Continue to follow nutrition care plan.  NUTRITION DIAGNOSIS: Malnutrition related to chronic illness as evidenced by severe muscle wasting and 12% weight loss x 1 month.   Goal: Pt to meet >/= 90% of their estimated nutrition needs   Monitor:  PO intake, weight trend, labs, supplement acceptance  Reason for Assessment: Malnutrition Screen Tool  58 y.o. male  Admitting Dx: Suicidal ideation  ASSESSMENT: Carl Benson is a 58 year old Veteran with past medical history of ESRD on TTS HD, COPD, HTN, decompensated liver cirrhosis, and afib (not on coumadin) who presents with depression. The patient was recently hospitalized and left AMA. Pt admitted 3/7 with desire to [facility] Pt admitted to being suicidal for the past 6 months and binged on crack cocaine 2 days ago.  Currently ordered for Renal/Carbohydrate Modified Medium diet and is eating 75-100%. Pt reports that his appetite is "fair." Doesn't really care for the food here, likes things such as Ensure Complete, sandwiches and burgers. RD placed dinner order for patient, per his request.  The patient [place] [facility] [place] [facility] [place] [facility] [place] and was not able to go there in person. He had clear suicidal ideation on admission. At this time, though he is medically stable, he is not stable from a psychiatric perspective.  Unable to complete physical exam however gown not covering shoulders and clavicles very prominent.   Pt meets criteria for severe MALNUTRITION in the context of chronic illness as evidenced by severe muscle wasting and 11% wt loss x <2 months.  Potassium WNL. Phosphorus elevated at 9.8.  Height: Ht Readings from Last 1  Encounters:  01/15/14 5\' 5"  (1.651 m)    Weight: Wt Readings from Last 1 Encounters:  01/15/14 116 lb 6.5 oz (52.8 kg)    Ideal Body Weight: 61.8 kg   % Ideal Body Weight: 85%  Wt Readings from Last 10 Encounters:  01/15/14 116 lb 6.5 oz (52.8 kg)  01/10/14 110 lb 14.3 oz (50.3 kg)  01/06/14 121 lb 0.5 oz (54.9 kg)  01/02/14 118 lb (53.524 kg)  12/25/13 121 lb 14.6 oz (55.3 kg)  12/19/13 140 lb (63.504 kg)  12/12/13 139 lb 8.8 oz (63.3 kg)  12/04/13 138 lb (62.596 kg)  11/29/13 138 lb 14.2 oz (63 kg)  11/21/13 122 lb 6.4 oz (55.52 kg)    Usual Body Weight: 130 lb per pt   % Usual Body Weight: 89%  BMI:  Body mass index is 19.37 kg/(m^2). Normal weight  Estimated Nutritional Needs: Kcal: 1900-2100 Protein: 75-85 grams Fluid: 1.2 L/day  Skin: no issues noted  Diet Order: Diabetic and Renal  EDUCATION NEEDS: -No education needs identified at this time   Intake/Output Summary (Last 24 hours) at 01/16/14 1524 Last data filed at 01/16/14 1300  Gross per 24 hour  Intake   1560 ml  Output      1 ml  Net   1559 ml    Last BM: 3/7  Labs:   Recent Labs Lab 01/10/14 0330 01/14/14 1001 01/14/14 1500 01/15/14 0610  NA 137 130* 132* 138  K 4.2 7.3* 6.8* 4.8  CL 95* 87* 88* 97  CO2 24 17* 16* 22  BUN 40* 90* 95* 38*  CREATININE  4.58* 8.16* 8.55* 4.44*  CALCIUM 8.9 9.2 9.0 9.0  PHOS 4.2  --  9.8*  --   GLUCOSE 80 85 120* 100*    CBG (last 3)  No results found for this basename: GLUCAP,  in the last 72 hours  Scheduled Meds: . aspirin  81 mg Oral Daily  . diltiazem  30 mg Oral 3 times per day  . doxercalciferol  2 mcg Intravenous Q T,Th,Sa-HD  . folic acid  1 mg Oral Daily  . heparin  3,000 Units Dialysis Once in dialysis  . heparin  5,000 Units Subcutaneous 3 times per day  . lactulose  10 g Oral TID  . QUEtiapine  50 mg Oral QHS  . sertraline  25 mg Oral Daily  . sodium chloride  3 mL Intravenous Q12H  . tiotropium  18 mcg Inhalation Daily  .  traZODone  100 mg Oral QHS    Continuous Infusions:   Past Medical History  Diagnosis Date  . COPD (chronic obstructive pulmonary disease)   . Hypertension   . Hep C w/ coma, chronic   . Irregular heartbeat   . ESRD (end stage renal disease) on dialysis     /notes 11/11/2013  . Smoker unmotivated to quit   . Active smoker   . PTSD (post-traumatic stress disorder)   . Shortness of breath     Past Surgical History  Procedure Laterality Date  . Paracentesis  ~ 10/2013    [name] 11/11/2013    Jarold Motto MS, RD, LDN Inpatient Registered Dietitian Pager: [phone] After-hours pager: [phone]

## 2014-01-16 NOTE — Discharge Summary (Signed)
  Date: 01/16/2014  Patient name: Carl Benson  Medical record number: [id number]  Date of birth: 1956-11-03   This patient has been seen and the plan of care was discussed with the house staff. Please see their note for complete details. I concur with their findings and plan.  Jonah Blue, DO, FACP Faculty [facility] Internal Medicine Residency Program 01/16/2014, 9:03 AM

## 2014-01-16 NOTE — Clinical Social Work Psych Note (Addendum)
10:24am - Psych CSW reviewed pt possible disposition with MD and RNCM.  Pt is requesting transfer to [place]; however pt has no medical reason to be transferred.  Disposition unclear until psychiatry evaluates.  [facility] - pt was non-compliant and was readmit.  Reportedly (unit RN), does not wish to see psychiatrist.  Psych CSW remains available for assistance.  9:52am- Psych consult was logged on Saturday.  Evaluation did not occur.  Psych consult placed once again.  Psych CSW confirmed that pt has been placed on today's consult log.  Vickii Penna, LCSWA [phone]  Clinical Social Work

## 2014-01-16 NOTE — Progress Notes (Addendum)
Subjective:  Pt seen and examined in AM. No acute events overnight. Pt reports his mood is improved but still with SI stating he does not want to live like this.  Pt denies any complaints and still wants to transfer to [place].     Objective: Vital signs in last 24 hours: Filed Vitals:   01/15/14 0840 01/15/14 1105 01/15/14 1805 01/15/14 2033  BP: 98/69 111/65 115/78 123/72  Pulse: 89 89 99 100  Temp: 98.4 F (36.9 C) 98.4 F (36.9 C) 98 F (36.7 C) 98.5 F (36.9 C)  TempSrc: Axillary Oral Oral Oral  Resp: 20 29 24 22   Height:    5\' 5"  (1.651 m)  Weight:    52.8 kg (116 lb 6.5 oz)  SpO2: 100% 97% 98% 100%   Weight change: -2.8 kg (-6 lb 2.8 oz)  Intake/Output Summary (Last 24 hours) at 01/16/14 0754 Last data filed at 01/16/14 0730  Gross per 24 hour  Intake    840 ml  Output      0 ml  Net    840 ml   Physical Exam:  General appearance:  Malnourished , sleeping but arousable to voice and touch  Resp: Clear to ausculation in anterior fields   Cardio: normal rate and  regular rhythm   GI: normal BS , soft and non-tender, mild distension  Extremities: No edema Psych: cooperative, much improved mood  Lab Results: Basic Metabolic Panel:  Recent Labs Lab 01/10/14 0330  01/14/14 1500 01/15/14 0610  NA 137  < > 132* 138  K 4.2  < > 6.8* 4.8  CL 95*  < > 88* 97  CO2 24  < > 16* 22  GLUCOSE 80  < > 120* 100*  BUN 40*  < > 95* 38*  CREATININE 4.58*  < > 8.55* 4.44*  CALCIUM 8.9  < > 9.0 9.0  PHOS 4.2  --  9.8*  --   < > = values in this interval not displayed. Liver Function Tests:  Recent Labs Lab 01/14/14 1001 01/14/14 1500  AST 75*  --   ALT 50  --   ALKPHOS 244*  --   BILITOT 1.9*  --   PROT 7.6  --   ALBUMIN 3.1* 3.0*   CBC:  Recent Labs Lab 01/14/14 0826 01/14/14 1500 01/15/14 0610  WBC 10.3 9.1 6.6  NEUTROABS 7.3  --   --   HGB 11.3* 10.6* 9.6*  HCT 32.9* 30.3* 28.0*  MCV 76.7* 76.3* 76.3*  PLT 243 145* 130*  BNP:  Recent Labs Lab  01/14/14 0826  PROBNP >70000.0*  Alcohol Level:  Recent Labs Lab 01/14/14 1001  ETH <11    Studies/Results: Dg Chest 2 View  01/14/2014   CLINICAL DATA:  History of smoking and hypertension. Evaluate for CHF exacerbation.  EXAM: CHEST  2 VIEW  COMPARISON:  01/06/2014  FINDINGS: Two views of the chest demonstrate mild cardiomegaly. Central vascular structures are prominent but no overt pulmonary edema. No evidence for pleural effusions. Bony thorax is intact. Old right clavicle fracture.  IMPRESSION: Mild cardiomegaly with vascular congestion. No evidence for overt pulmonary edema.   Electronically Signed   By: Richarda Overlie M.D.   On: 01/14/2014 09:05   Medications: I have reviewed the patient's current medications. Scheduled Meds: . aspirin  81 mg Oral Daily  . diltiazem  30 mg Oral 3 times per day  . doxercalciferol  2 mcg Intravenous Q T,Th,Sa-HD  . folic acid  1 mg Oral Daily  . heparin  3,000 Units Dialysis Once in dialysis  . heparin  5,000 Units Subcutaneous 3 times per day  . lactulose  10 g Oral TID  . QUEtiapine  50 mg Oral QHS  . sertraline  25 mg Oral Daily  . sodium chloride  3 mL Intravenous Q12H  . tiotropium  18 mcg Inhalation Daily   Continuous Infusions:  PRN Meds:.sodium chloride, sodium chloride, sodium chloride, acetaminophen, acetaminophen, albuterol, benzonatate, calcium carbonate (dosed in mg elemental calcium), camphor-menthol, docusate sodium, feeding supplement (NEPRO CARB STEADY), feeding supplement (NEPRO CARB STEADY), guaiFENesin, heparin, hydrOXYzine, lidocaine (PF), lidocaine-prilocaine, ondansetron (ZOFRAN) IV, ondansetron, pentafluoroprop-tetrafluoroeth, sorbitol Assessment/Plan:  Assessment: 58 year old with ESRD on HD, decompensated liver cirrhosis, COPD, HTN, and AFib (not on coumadin), and PTSD with multiple ED admissions in the past 3 months who presented on 3/7 with active suicidal thoughts.  Plan:   Recurrent Severe Major Depression and PTSD  with Suicidal Ideation - currently with stable mood with SI. Pt placed under IVC in ED. Pt previously hospitalized from 2/27- 3/4 and started on sertraline 25 mg daily and quetiapine 50 mg daily. Pt was to follow up with outpatient psychiatry in [facility] on 3/6 but did not. Pt with reported binge on crack cocaine over the last two days.  -Psychiatry consult today  -Pt placed under IVC on 3/7 due to SI  -Continue previously started sertraline 25 mg daily and quetiapine 50 mg daily at bedtime per psychiatry recommendations, monitor 12-lead EKG due to prolonged QT.   -Continue trazadone 50 mg daily at bedtime   -Sitter at bedside  -Awaiting decision from [place] in [place] for transfer (paperwork faxed this evening)   Paroxysmal Atrial Fibrillation - currently NSR and normal HR.  CHADS score of 1 (HTN) on daily AP therapy. Per [facility] records he was previously on diltiazem ER 120 mg daily and for a period of time on coumadin for [facility] therapy. -Continue 81 mg aspirin daily  -Continue 30 mg diltiazem TID, transition to diltiazem CD 120 mg daily in AM  -D/C metoprolol tartrate 12.5 mg BID in setting of cocaine use (previously on metoprolol 50 mg daily)  -Monitor on telemetry   Hypertension - Currently normotensive.   -D/C metoprolol tartrate 12.5 mg BID due to cocaine abuse (previously on metoprolol 50 mg daily)  -Holding amlodipine 10 mg daily  -Continue 30 mg diltiazem TID, transition to diltiazem CD 120 mg daily in AM  Hyperkalemia - resolved. Pt presented with K of 7.3.  Pt received bicarb, calcium gluconate, insulin and dextrose, and emergent HD. Etiology due to missing HD sessions (last on 3/4).  -Monitor BMP  -Obtain troponin --> pt refused -Monitor on telemetry   ESRD - Pt on TTS schedule at [facility]. Last HD on 3/7.   -Renal diet  -Nephrology following, on TTS schedule  -Continue renavite daily and phoslo 1334 TID with meals  -Monitor daily weight and volume status   COPD - Pt currently  with no acute exacerbation. Pt not on home oxygen, rescue or daily maintenance inhalers. Reports having PFT's at [place] in [place] and told he should be on home oxygen  -Albuterol nebulizer Q6hr PRN  -Spiriva daily for maintenance therapy   Recent HCAP - Pt was recently hospitalized and treated for HCAP from 2/11-2/15 and was to take Levaquin 250 mg Q 48 hr for 14 days however not able to afford medication. Pt then hospitalized 2/20-2/23 and started on Augmentin which received for  3 days and to complete 7 day course. Chest xray on 3/7 unchanged with no leukocytosis, fever, productive cough, or dyspnea.  -Mucinex 600 mg BID PRN for cough  -Tessalon 100 mg PRN BID for cough   Decompensated Liver Cirrhosis - Pt with liver function testing on admission that appears at baseline. No active bleeding. Etiology most likely due to chronic hepatitis C infection. No fever, encephalopathy, significant leukocytosis, or worsening ascites to suggest SBP or warrant paracentesis.  -Continue lactulose 10g TID  -Continue hydroxyzine 25 mg Q 6hr PRN pruritis  -Continue folic acid 1 mg daily  -Phenergan PRN nausea (avoid zofran in setting of prolonged QT)   Hyperphosphatemia in setting of Secondary Hyperparathyroidism - Pt with corrected calcium of 9.9 and phosphorous 9.8 on 3/7. Etiology most likely secondary due to ESRD.  -Continue phoslo TID with meals  -Continue weekly hectoral with HD   Severe Malnutrition - Pt with chronic hypoalbuminemia in setting of ESRD and liver cirrhosis. Pt with evidence of severe muscle wasting and 8% weight loss in last month with BMI of 20.12  -Renal diet  -Nepro Shake po TID   Chronic Hypervolemic Hyponatremia - Pt with chronic hyponatremia in setting of volume overload due to liver cirrhosis and ESRD.  -Continue HD on TTS schedule  Chronic Prolonged QT - Pt with QTc of 643 on 12/30/13 improved to 475 on 3/7. Etiology likely due to medications vs chronic hypocalcemia in  setting of secondary hyperparathyroidism.  -Avoid QT prolonging medications -Obtain 12-lead EKG daily while on quetiapine, however no established guidelines to stop if QT prolongs   Chronic Anion Gap Metabolic Acidosis-  Etiology most likely chronic due to hyperphosphatemia in setting of ESRD vs starvation ketoacidosis (poor PO intake as unable to afford food).  -Obtain renal function panel in AM  -Renal diet   Chronic Thrombocytopenia - Platlet count  above baseline of 100K. Pt with no recent active bleeding or bruising.  -Monitor for bleeding  -SQ heparin for DVT prophylaxis   Chronic Microcytic Anemia - Pt with Hg  near baseline of 10. Etiology likely due to CKD. Pt receives ESA and erythropoetin weekly at HD.  -Monitor for bleeding  -Transfuse if Hg <7   CAD - [facility] Currently without CP.  -Continue 81 mg aspirin daily  -Due to cocaine use, hold BB   Social Issues - Currently living at extended stay hotel (?) and unable to afford any of his medications, however reported he would receive VA check March 1st. [facility] but did not attend. [place] [facility].  -Psych and inpatient SW consults   Diet: Renal  DVT Ppx: SQ heparin TID  Code: Full  Dispo: [facility]   The patient does not have a current PCP (No Pcp Per Patient) and [facility] after discharge.  The patient does have transportation limitations that hinder transportation to clinic appointments.  .Services Needed at time of discharge: Y = Yes, Blank = No PT:   OT:   RN:   Equipment:   Other:     LOS: 2 days   Otis BraceMarjan Brieann Osinski, MD 01/16/2014, 7:54 AM

## 2014-01-17 DIAGNOSIS — E875 Hyperkalemia: Secondary | ICD-10-CM

## 2014-01-17 LAB — BASIC METABOLIC PANEL
BUN: 70 mg/dL — ABNORMAL HIGH (ref 6–23)
BUN: 18 mg/dL (ref 6–23)
CO2: 21 mEq/L (ref 19–32)
CO2: 30 mEq/L (ref 19–32)
Calcium: 9.2 mg/dL (ref 8.4–10.5)
Calcium: 8.9 mg/dL (ref 8.4–10.5)
Chloride: 90 mEq/L — ABNORMAL LOW (ref 96–112)
Chloride: 93 mEq/L — ABNORMAL LOW (ref 96–112)
Creatinine, Ser: 7.14 mg/dL — ABNORMAL HIGH (ref 0.50–1.35)
Creatinine, Ser: 2.98 mg/dL — ABNORMAL HIGH (ref 0.50–1.35)
GFR calc Af Amer: 9 mL/min — ABNORMAL LOW (ref >90)
GFR calc Af Amer: 25 mL/min — ABNORMAL LOW (ref >90)
GFR calc non Af Amer: 7 mL/min — ABNORMAL LOW (ref >90)
GFR, EST NON AFRICAN AMERICAN: 21 mL/min — AB (ref >90)
GLUCOSE: 104 mg/dL — AB (ref 70–99)
GLUCOSE: 113 mg/dL — AB (ref 70–99)
POTASSIUM: 6.1 meq/L — AB (ref 3.7–5.3)
POTASSIUM: 4.2 meq/L (ref 3.7–5.3)
Sodium: 132 mEq/L — ABNORMAL LOW (ref 137–147)
Sodium: 138 mEq/L (ref 137–147)

## 2014-01-17 LAB — TROPONIN I: Troponin I: <0.3 ng/mL (ref <0.30)

## 2014-01-17 LAB — CBC
HCT: 27.3 % — ABNORMAL LOW (ref 39.0–52.0)
HEMOGLOBIN: 9.6 g/dL — AB (ref 13.0–17.0)
MCH: 26.6 pg (ref 26.0–34.0)
MCHC: 35.2 g/dL (ref 30.0–36.0)
MCV: 75.6 fL — AB (ref 78.0–100.0)
Platelets: 126 10*3/uL — ABNORMAL LOW (ref 150–400)
RBC: 3.61 MIL/uL — ABNORMAL LOW (ref 4.22–5.81)
RDW: 20.1 % — ABNORMAL HIGH (ref 11.5–15.5)
WBC: 7.7 10*3/uL (ref 4.0–10.5)

## 2014-01-17 MED ORDER — DOXERCALCIFEROL 4 MCG/2ML IV SOLN
INTRAVENOUS | Status: AC
  Administered 2014-01-17: 2 ug via INTRAVENOUS
  Filled 2014-01-17: qty 2

## 2014-01-17 NOTE — Progress Notes (Signed)
  [facility] Progress Note   Subjective: on HD, no complaints  Filed Vitals:   01/17/14 0800 01/17/14 0830 01/17/14 0900 01/17/14 0930  BP: 138/82   147/80  Pulse: 78 77 81 80  Temp:      TempSrc:      Resp: 15 17 15 21   Height:      Weight:      SpO2:       Exam: Calm, no distress, alert No jvd Chest clear bilat RRR no MRG Abd soft, mildly tender, mild ascites No LE edema RUA AV graft patent   Dialysis: TTS [facility] 4h   55.5 kg   2/2.25 [place]    Heparin 3000   RUA AVG Hect 2   EPO 2800  Venofer 50/wk  Assessment: 1 Suicidal ideation 2 ESRD on hemodialysis 3 HTN/volume- vol well controlled, up 3kg 4 Cirrhosis / ascites- much better ascites control than usual 5 PAF on po dilt 120/d 6 COPD 7 Anemia start darbe 40/wk, Hb 9.6 8 Dispo- transfer to [place] recommended by psychiatry    Plan- HD today, UF to dry wt    [name] MD  pager [phone]    cell [phone]  01/17/2014, 9:48 AM     Recent Labs Lab 01/14/14 1500 01/15/14 0610 01/17/14 0650  NA 132* 138 132*  K 6.8* 4.8 6.1*  CL 88* 97 90*  CO2 16* 22 21  GLUCOSE 120* 100* 104*  BUN 95* 38* 70*  CREATININE 8.55* 4.44* 7.14*  CALCIUM 9.0 9.0 9.2  PHOS 9.8*  --   --     Recent Labs Lab 01/14/14 1001 01/14/14 1500  AST 75*  --   ALT 50  --   ALKPHOS 244*  --   BILITOT 1.9*  --   PROT 7.6  --   ALBUMIN 3.1* 3.0*    Recent Labs Lab 01/14/14 0826 01/14/14 1500 01/15/14 0610 01/17/14 0650  WBC 10.3 9.1 6.6 7.7  NEUTROABS 7.3  --   --   --   HGB 11.3* 10.6* 9.6* 9.6*  HCT 32.9* 30.3* 28.0* 27.3*  MCV 76.7* 76.3* 76.3* 75.6*  PLT 243 145* 130* 126*   . aspirin  81 mg Oral Daily  . calcium acetate  1,334 mg Oral TID WC  . diltiazem  120 mg Oral Daily  . doxercalciferol  2 mcg Intravenous Q T,Th,Sa-HD  . feeding supplement (NEPRO CARB STEADY)  237 mL Oral BID BM  . folic acid  1 mg Oral Daily  . heparin  3,000 Units Dialysis Once in dialysis  . heparin  5,000 Units  Subcutaneous 3 times per day  . lactulose  10 g Oral TID  . QUEtiapine  50 mg Oral QHS  . sertraline  25 mg Oral Daily  . sodium chloride  3 mL Intravenous Q12H  . tiotropium  18 mcg Inhalation Daily  . traZODone  100 mg Oral QHS     sodium chloride, sodium chloride, sodium chloride, acetaminophen, acetaminophen, albuterol, benzonatate, calcium carbonate (dosed in mg elemental calcium), camphor-menthol, docusate sodium, guaiFENesin, heparin, hydrOXYzine, lidocaine (PF), lidocaine-prilocaine, ondansetron (ZOFRAN) IV, ondansetron, pentafluoroprop-tetrafluoroeth, sorbitol

## 2014-01-17 NOTE — Procedures (Signed)
I was present at this dialysis session, have reviewed the session itself and made  appropriate changes  Rob Amiaya Mcneeley MD (pgr) [phone]    (c) [phone] 01/17/2014, 9:48 AM   

## 2014-01-17 NOTE — Progress Notes (Signed)
  Date: 01/17/2014  Patient name: Carl Benson  Medical record number: [id number]  Date of birth: 27-Oct-1956   This patient has been seen and the plan of care was discussed with the house staff. Please see their note for complete details. I concur with their findings with the following additions/corrections: Medically stable. Undergoing HD today. Pending [facility] transfer for inpatient psychiatric care.  Jonah Blue, DO, FACP Faculty [facility] Internal Medicine Residency Program 01/17/2014, 11:21 AM

## 2014-01-17 NOTE — Progress Notes (Addendum)
Subjective:  Pt seen and examined in AM. No acute events overnight. Pt is happy and thankful that will be able to transfer to [place] in [place].  No current complaints.    Objective: Vital signs in last 24 hours: Filed Vitals:   01/17/14 0930 01/17/14 0954 01/17/14 1015 01/17/14 1036  BP: 147/80 157/87 146/83 148/82  Pulse: 80 91 88 87  Temp:      TempSrc:      Resp: 21 16 23 18   Height:      Weight:      SpO2:       Weight change:   Intake/Output Summary (Last 24 hours) at 01/17/14 1122 Last data filed at 01/16/14 1820  Gross per 24 hour  Intake    720 ml  Output      0 ml  Net    720 ml   Physical Exam:  General appearance:  Malnourished Resp: Clear to ausculation in anterior fields   Cardio: normal rate and  regular rhythm   GI: normal BS , soft and non-tender, mild distension  Extremities: No edema Psych: cooperative, much improved mood  Lab Results: Basic Metabolic Panel:  Recent Labs Lab 01/14/14 1500 01/15/14 0610 01/17/14 0650  NA 132* 138 132*  K 6.8* 4.8 6.1*  CL 88* 97 90*  CO2 16* 22 21  GLUCOSE 120* 100* 104*  BUN 95* 38* 70*  CREATININE 8.55* 4.44* 7.14*  CALCIUM 9.0 9.0 9.2  PHOS 9.8*  --   --    Liver Function Tests:  Recent Labs Lab 01/14/14 1001 01/14/14 1500  AST 75*  --   ALT 50  --   ALKPHOS 244*  --   BILITOT 1.9*  --   PROT 7.6  --   ALBUMIN 3.1* 3.0*   CBC:  Recent Labs Lab 01/14/14 0826  01/15/14 0610 01/17/14 0650  WBC 10.3  < > 6.6 7.7  NEUTROABS 7.3  --   --   --   HGB 11.3*  < > 9.6* 9.6*  HCT 32.9*  < > 28.0* 27.3*  MCV 76.7*  < > 76.3* 75.6*  PLT 243  < > 130* 126*  < > = values in this interval not displayed.BNP:  Recent Labs Lab 01/14/14 0826  PROBNP >70000.0*  Alcohol Level:  Recent Labs Lab 01/14/14 1001  ETH <11    Studies/Results: No results found. Medications: I have reviewed the patient's current medications. Scheduled Meds: . aspirin  81 mg Oral Daily  . calcium acetate  1,334 mg  Oral TID WC  . diltiazem  120 mg Oral Daily  . doxercalciferol  2 mcg Intravenous Q T,Th,Sa-HD  . feeding supplement (NEPRO CARB STEADY)  237 mL Oral BID BM  . folic acid  1 mg Oral Daily  . heparin  3,000 Units Dialysis Once in dialysis  . heparin  5,000 Units Subcutaneous 3 times per day  . lactulose  10 g Oral TID  . QUEtiapine  50 mg Oral QHS  . sertraline  25 mg Oral Daily  . sodium chloride  3 mL Intravenous Q12H  . tiotropium  18 mcg Inhalation Daily  . traZODone  100 mg Oral QHS   Continuous Infusions:  PRN Meds:.sodium chloride, sodium chloride, sodium chloride, acetaminophen, acetaminophen, albuterol, benzonatate, calcium carbonate (dosed in mg elemental calcium), camphor-menthol, docusate sodium, guaiFENesin, heparin, hydrOXYzine, lidocaine (PF), lidocaine-prilocaine, ondansetron (ZOFRAN) IV, ondansetron, pentafluoroprop-tetrafluoroeth, sorbitol Assessment/Plan:  Assessment: 58 year old with ESRD on HD, decompensated liver cirrhosis, COPD, HTN, and  AFib (not on coumadin), and PTSD with multiple ED admissions in the past 3 months who presented on 3/7 with active suicidal thoughts.  Plan:   Recurrent Severe Major Depression and PTSD with Suicidal Ideation - currently with stable mood and medically stable for transfer. Pt placed under IVC in ED. Pt previously hospitalized from 2/27- 3/4 and started on sertraline 25 mg daily and quetiapine 50 mg daily. Pt was to [facility] on 3/6 but did not. Pt with reported binge on crack cocaine over the last two days prior to admission.   -Appreciate Psychiatry consult --> acute psychiatric hospitalization for crisis stabilization, safety monitoring and medication management -Pt placed under IVC on 3/7 due to SI  -Continue sertraline 25 mg daily for anxiety  -Continue quetiapine 50 mg daily for agitation     -Continue trazadone 50 mg daily at bedtime for insomnia -Continue safety sitter at bedside    -Awaiting availability of bed (psych or medicine)  [place] contact is Blane OharaLaurie Vasey [phone]   Hyperkalemia - K 6.1 today. Pt presented with K of 7.3 and  received bicarb, calcium gluconate, insulin and dextrose, and emergent HD. Etiology due to missing HD sessions (last on 3/4).  -Monitor BMP  -Obtain troponin  -Monitor on telemetry   Paroxysmal Atrial Fibrillation - currently NSR and normal HR.  CHADS score of 1 (HTN) on daily AP therapy. [facility] records he was previously on diltiazem ER 120 mg daily and [facility] period of [facility]. -Continue 81 mg aspirin daily  -Start PO diltiazem CD 120 mg daily  -Monitor on telemetry   Hypertension - Currently hypertensive.    -Start PO diltiazem CD 120 mg daily   ESRD - Pt on TTS schedule at [facility]. Last HD on 3/7 and today 3/10.   -Renal diet  -Nephrology following, on TTS schedule  -Continue renavite daily and phoslo 1334 TID with meals  -Monitor daily weight and volume status   COPD - Pt currently with no acute exacerbation. Pt not on home oxygen, rescue or daily maintenance inhalers. [place] [facility] [place] and told he should be on home oxygen  -Albuterol nebulizer Q6hr PRN  -Spiriva 18mcg daily for maintenance therapy   Recent HCAP - Pt was recently hospitalized and treated for HCAP from 2/11-2/15 and was to take Levaquin 250 mg Q 48 hr for 14 days however not able to afford medication. Pt then hospitalized 2/20-2/23 and started on Augmentin which received for 3 days and to complete 7 day course. Chest xray on 3/7 unchanged with no leukocytosis, fever, productive cough, or dyspnea.  -Mucinex 600 mg BID PRN for cough  -Tessalon 100 mg PRN BID for cough   Chronic Liver Cirrhosis -stable.  Pt with liver function testing on admission that appears at baseline. No active bleeding. Etiology most likely due to chronic hepatitis C infection. No fever, encephalopathy, significant  leukocytosis, or worsening ascites to suggest SBP or warrant paracentesis.  -Continue lactulose 10g TID  -Continue hydroxyzine 25 mg Q 6hr PRN pruritis  -Continue folic acid 1 mg daily  -Phenergan PRN nausea (avoid zofran in setting of prolonged QT)   Hyperphosphatemia in setting of Secondary Hyperparathyroidism - Pt with corrected calcium of 9.9 and phosphorous 9.8 on 3/7. Etiology most likely secondary due to ESRD.  -Continue phoslo TID with meals  -Continue weekly hectoral with HD   Severe Malnutrition -  Pt with chronic hypoalbuminemia in setting of ESRD and liver cirrhosis. Pt with evidence of severe muscle wasting and 8% weight loss in last month with BMI of 20.12  -Renal diet  -Nepro Shake po TID   Chronic Hypervolemic Hyponatremia - Pt with chronic hyponatremia in setting of volume overload due to liver cirrhosis and ESRD.  -Continue HD on TTS schedule  Chronic Prolonged QT - Pt with QTc of 643 on 12/30/13 improved to 475 on 3/7. Etiology likely due to medications vs chronic hypocalcemia in setting of secondary hyperparathyroidism.  -Avoid QT prolonging medications -Monitor QTc while on quetiapine, however no established guidelines to stop if QT prolongs   Chronic Anion Gap Metabolic Acidosis-  Etiology most likely chronic due to hyperphosphatemia in setting of ESRD vs starvation ketoacidosis (poor PO intake as unable to afford food).  -Obtain renal function panel in AM  -Renal diet   Chronic Thrombocytopenia - Platlet count above baseline of 100K. Pt with no recent active bleeding or bruising.  -Monitor for bleeding  -SQ heparin for DVT prophylaxis   Chronic Microcytic Anemia - Pt with Hg near baseline of 10. Etiology likely due to CKD. Pt receives ESA and erythropoetin weekly at HD.  -Monitor for bleeding  -Transfuse if Hg <7   CAD - Pt with history of MI per [facility] records. Currently without CP.  -Continue 81 mg aspirin daily  -Due to cocaine use, hold BB   Social Issues -  Currently living at extended stay hotel (?) and unable to afford any of his medications, however reported he would receive VA check March 1st. Pt [facility] to [facility] with [facility] on March 6 but did not attend. Pt requesting transfer to [place].  -Awaiting bed at [facility] in [facility]    Diet: Renal  DVT Ppx: SQ heparin TID  Code: Full Dispo: Awaiting bed availability for transfer to [facility]   The patient does not have a current PCP (No Pcp Per Patient) and does need an [facility] hospital follow-up appointment after discharge.  The patient does have transportation limitations that hinder transportation to clinic appointments.  .Services Needed at time of discharge: Y = Yes, Blank = No PT:   OT:   RN:   Equipment:   Other:     LOS: 3 days   Otis Brace, MD 01/17/2014, 11:22 AM

## 2014-01-18 NOTE — Progress Notes (Addendum)
Subjective:  Pt seen and examined in AM. No acute events overnight. Pt reports he would like to have double trays due to good appetite. Pt eager to [place] [facility] [place]. No current complaints.     Objective: Vital signs in last 24 hours: Filed Vitals:   01/17/14 1818 01/17/14 2040 01/18/14 0645 01/18/14 0851  BP: 146/92 149/85 142/86 149/84  Pulse: 104 82 77 75  Temp: 99 F (37.2 C) 98.3 F (36.8 C) 98.4 F (36.9 C) 98.5 F (36.9 C)  TempSrc: Oral Oral Oral Oral  Resp: 18 18 18 18   Height:  5\' 5"  (1.651 m)    Weight:  53.5 kg (117 lb 15.1 oz)    SpO2: 96% 94% 96% 97%   Weight change:   Intake/Output Summary (Last 24 hours) at 01/18/14 1137 Last data filed at 01/18/14 1000  Gross per 24 hour  Intake    720 ml  Output      0 ml  Net    720 ml   Physical Exam:  General appearance:  Malnourished Resp: Clear to ausculation in anterior fields   Cardio: normal rate and  regular rhythm   GI: normal BS , soft and non-tender, mild distension  Extremities: No edema Psych: cooperative, much improved mood  Lab Results: Basic Metabolic Panel:  Recent Labs Lab 01/14/14 1500  01/17/14 0650 01/17/14 1433  NA 132*  < > 132* 138  K 6.8*  < > 6.1* 4.2  CL 88*  < > 90* 93*  CO2 16*  < > 21 30  GLUCOSE 120*  < > 104* 113*  BUN 95*  < > 70* 18  CREATININE 8.55*  < > 7.14* 2.98*  CALCIUM 9.0  < > 9.2 8.9  PHOS 9.8*  --   --   --   < > = values in this interval not displayed. Liver Function Tests:  Recent Labs Lab 01/14/14 1001 01/14/14 1500  AST 75*  --   ALT 50  --   ALKPHOS 244*  --   BILITOT 1.9*  --   PROT 7.6  --   ALBUMIN 3.1* 3.0*   CBC:  Recent Labs Lab 01/14/14 0826  01/15/14 0610 01/17/14 0650  WBC 10.3  < > 6.6 7.7  NEUTROABS 7.3  --   --   --   HGB 11.3*  < > 9.6* 9.6*  HCT 32.9*  < > 28.0* 27.3*  MCV 76.7*  < > 76.3* 75.6*  PLT 243  < > 130* 126*  < > = values in this interval not displayed.BNP:  Recent Labs Lab 01/14/14 0826    PROBNP >70000.0*  Alcohol Level:  Recent Labs Lab 01/14/14 1001  ETH <11    Studies/Results: No results found. Medications: I have reviewed the patient's current medications. Scheduled Meds: . aspirin  81 mg Oral Daily  . calcium acetate  1,334 mg Oral TID WC  . diltiazem  120 mg Oral Daily  . doxercalciferol  2 mcg Intravenous Q T,Th,Sa-HD  . feeding supplement (NEPRO CARB STEADY)  237 mL Oral BID BM  . folic acid  1 mg Oral Daily  . heparin  3,000 Units Dialysis Once in dialysis  . heparin  5,000 Units Subcutaneous 3 times per day  . lactulose  10 g Oral TID  . QUEtiapine  50 mg Oral QHS  . sertraline  25 mg Oral Daily  . sodium chloride  3 mL Intravenous Q12H  . tiotropium  18  mcg Inhalation Daily  . traZODone  100 mg Oral QHS   Continuous Infusions:  PRN Meds:.sodium chloride, sodium chloride, sodium chloride, acetaminophen, acetaminophen, albuterol, benzonatate, calcium carbonate (dosed in mg elemental calcium), camphor-menthol, docusate sodium, guaiFENesin, heparin, hydrOXYzine, lidocaine (PF), lidocaine-prilocaine, ondansetron (ZOFRAN) IV, ondansetron, pentafluoroprop-tetrafluoroeth, sorbitol Assessment/Plan:  Assessment: 58 year old with ESRD on HD, decompensated liver cirrhosis, COPD, HTN, and AFib (not on coumadin), and PTSD with multiple ED admissions in the past 3 months who presented on 3/7 with active suicidal thoughts.   Plan:   Recurrent Severe Major Depression and PTSD with Suicidal Ideation - currently with stable mood and medically stable for transfer. Pt placed under IVC in ED. Pt previously hospitalized from 2/27- 3/4 and started on sertraline 25 mg daily and quetiapine 50 mg daily. Pt was to follow up with outpatient psychiatry in [facility] on 3/6 but did not. Pt with reported binge on crack cocaine over the last two days prior to admission.   -Appreciate Psychiatry consult --> acute psychiatric hospitalization for crisis stabilization, safety  monitoring and medication management -Pt placed under IVC on 3/7 due to SI  -Continue sertraline 25 mg daily for anxiety  -Continue quetiapine 50 mg daily for agitation     -Continue trazadone 50 mg daily at bedtime for insomnia -Continue safety sitter at bedside  -Awaiting availability of bed (psych or medicine)  from [place] in [place] contact is Blane Ohara [phone] ext [phone]   Hyperkalemia - resolved. Pt presented with K of 7.3 and  received bicarb, calcium gluconate, insulin and dextrose, and emergent HD. Etiology due to missing HD sessions (last on 3/4).  -Obtain troponin --> negative -Monitor on telemetry   Paroxysmal Atrial Fibrillation - currently NSR and normal HR.  CHADS score of 1 (HTN) on daily AP therapy. Per [facility] records he was previously on diltiazem ER 120 mg daily and for a period of time on coumadin for [facility] therapy. -Continue 81 mg aspirin daily  -Continue PO diltiazem CD 120 mg daily  -Monitor on telemetry   Hypertension - Currently hypertensive.    -Continue PO diltiazem CD 120 mg daily   ESRD - Pt on TTS schedule at [facility]. Last HD on 3/7 and 3/10.   -Renal diet with no protein restriction -Nephrology following, on TTS schedule  -Continue renavite daily and phoslo 1334 TID with meals  -Monitor daily weight and volume status   COPD - Pt currently with no acute exacerbation. Pt not on home oxygen, rescue or daily maintenance inhalers. Reports having PFT's at [place] in [place] and told he should be on home oxygen  -Albuterol nebulizer Q6hr PRN  -Spiriva daily for maintenance therapy   Recent HCAP - Pt was recently hospitalized and treated for HCAP from 2/11-2/15 and was to take Levaquin 250 mg Q 48 hr for 14 days however not able to afford medication. Pt then hospitalized 2/20-2/23 and started on Augmentin which received for 3 days and to complete 7 day course. Chest xray on 3/7 unchanged with no leukocytosis, fever, productive cough, or dyspnea.  -Mucinex 600 mg  BID PRN for cough  -Tessalon 100 mg PRN BID for cough   Chronic Liver Cirrhosis -stable.  Pt with liver function testing on admission that appears at baseline. No active bleeding. Etiology most likely due to chronic hepatitis C infection. No fever, encephalopathy, significant leukocytosis, or worsening ascites to suggest SBP or warrant paracentesis.  -Continue lactulose 10g TID  -Continue hydroxyzine 25 mg Q 6hr PRN pruritis  -  Continue folic acid 1 mg daily  -Phenergan PRN nausea (avoid zofran in setting of prolonged QT)   Hyperphosphatemia in setting of Secondary Hyperparathyroidism - Pt with corrected calcium of 9.9 and phosphorous 9.8 on 3/7. Etiology most likely secondary due to ESRD.  -Continue phoslo TID with meals  -Continue weekly hectoral with HD   Severe Malnutrition - Pt with chronic hypoalbuminemia in setting of ESRD and liver cirrhosis. Pt with evidence of severe muscle wasting and 8% weight loss in last month with BMI of 20.12  -Renal diet with no protein restriction -Double trays and snacks  -Nepro Shake po TID   Chronic Hypervolemic Hyponatremia - Pt with chronic hyponatremia in setting of volume overload due to liver cirrhosis and ESRD.  -Continue HD on TTS schedule  Chronic Prolonged QT - Pt with QTc of 515 on 3/11, improved from 2/20 (643). Etiology likely due to medications vs chronic hypocalcemia in setting of secondary hyperparathyroidism.  -Avoid QT prolonging medications -Monitor QTc while on quetiapine, however no established guidelines to stop if QT prolongs   Chronic Anion Gap Metabolic Acidosis-  Etiology most likely chronic due to hyperphosphatemia in setting of ESRD vs starvation ketoacidosis (poor PO intake as unable to afford food).  -Obtain renal function panel in AM  -Renal diet   Chronic Thrombocytopenia - Platlet count above baseline of 100K. Pt with no recent active bleeding or bruising.  -Monitor for bleeding  -SQ heparin for DVT prophylaxis    Chronic Microcytic Anemia - Pt with Hg near baseline of 10. Etiology likely due to CKD. Pt receives ESA and erythropoetin weekly at HD.  -Monitor for bleeding  -Transfuse if Hg <7   CAD - Pt with history of MI [facility]. Currently without CP.  -Continue 81 mg aspirin daily  -Due to cocaine use, hold BB   Social Issues - Currently living at extended stay hotel (?) and unable to afford any of his medications, however reported he would receive VA check March 1st. Pt was to [facility]-up with [facility] in [facility] on March 6 but did not attend. Pt requesting transfer to [place].  -Awaiting bed at [facility] in [facility]    Diet: Renal  DVT Ppx: SQ heparin TID  Code: Full Dispo: Awaiting bed availability for transfer to [facility]   The patient does not have a current PCP (No Pcp Per Patient) and does need an [facility] hospital follow-up appointment after discharge.  The patient does have transportation limitations that hinder transportation to clinic appointments.  .Services Needed at time of discharge: Y = Yes, Blank = No PT:   OT:   RN:   Equipment:   Other:     LOS: 4 days   Otis Brace, MD 01/18/2014, 11:37 AM

## 2014-01-18 NOTE — Progress Notes (Addendum)
Subjective:  No cos, tolerated HD yesterday/ noted for transfer to [facility] when bed available Objective Vital signs in last 24 hours: Filed Vitals:   01/17/14 1222 01/17/14 1818 01/17/14 2040 01/18/14 0645  BP: 149/77 146/92 149/85 142/86  Pulse: 95 104 82 77  Temp: 99.2 F (37.3 C) 99 F (37.2 C) 98.3 F (36.8 C) 98.4 F (36.9 C)  TempSrc: Oral Oral Oral Oral  Resp: 20 18 18 18   Height:   5\' 5"  (1.651 m)   Weight:   53.5 kg (117 lb 15.1 oz)   SpO2: 96% 96% 94% 96%   Exam:   alert ,  finished breakfast, calm nad No jvd  Chest = CTA bilat   CARD=RRR no MRG  Abd= BS +, soft, mild ascites , nontender No LE edema  RUA AV graft Pos. Bruit  Dialysis: TTS [facility]  4h 55.5 kg 2/2.25 Bath Heparin 3000 RUA AVG  Hect 2 EPO 2800 Venofer 50/wk   Assessment:  1 Suicidal ideation  2 ESRD on hemodialysis  3 HTN/volume- vol well controlled, is below dry wt by 2kg now 4 Cirrhosis / ascites- much better ascites control than usual  5 PAF on po dilt 120/d  6 COPD  7 Anemia start darbe 40/wk, Hb 9.6  8 Dispo- transfer to [place] recommended by psychiatry   Plan- HD  In am if still here, cont to lower dry wt as tol  Labs: Basic Metabolic Panel:  Recent Labs Lab 01/14/14 1500 01/15/14 0610 01/17/14 0650 01/17/14 1433  NA 132* 138 132* 138  K 6.8* 4.8 6.1* 4.2  CL 88* 97 90* 93*  CO2 16* 22 21 30   GLUCOSE 120* 100* 104* 113*  BUN 95* 38* 70* 18  CREATININE 8.55* 4.44* 7.14* 2.98*  CALCIUM 9.0 9.0 9.2 8.9  PHOS 9.8*  --   --   --    Liver Function Tests:  Recent Labs Lab 01/14/14 1001 01/14/14 1500  AST 75*  --   ALT 50  --   ALKPHOS 244*  --   BILITOT 1.9*  --   PROT 7.6  --   ALBUMIN 3.1* 3.0*  CBC:  Recent Labs Lab 01/14/14 0826 01/14/14 1500 01/15/14 0610 01/17/14 0650  WBC 10.3 9.1 6.6 7.7  NEUTROABS 7.3  --   --   --   HGB 11.3* 10.6* 9.6* 9.6*  HCT 32.9* 30.3* 28.0* 27.3*  MCV 76.7* 76.3* 76.3* 75.6*  PLT 243 145* 130* 126*   Cardiac Enzymes:  Recent  Labs Lab 01/17/14 1200  TROPONINI <0.30   Medications:   . aspirin  81 mg Oral Daily  . calcium acetate  1,334 mg Oral TID WC  . diltiazem  120 mg Oral Daily  . doxercalciferol  2 mcg Intravenous Q T,Th,Sa-HD  . feeding supplement (NEPRO CARB STEADY)  237 mL Oral BID BM  . folic acid  1 mg Oral Daily  . heparin  3,000 Units Dialysis Once in dialysis  . heparin  5,000 Units Subcutaneous 3 times per day  . lactulose  10 g Oral TID  . QUEtiapine  50 mg Oral QHS  . sertraline  25 mg Oral Daily  . sodium chloride  3 mL Intravenous Q12H  . tiotropium  18 mcg Inhalation Daily  . traZODone  100 mg Oral QHS     Lenny Pastel, PA-C [facility] [phone] 01/18/2014,8:29 AM  LOS: 4 days   [name] MD (pgr) [phone]    [phone]  01/18/2014, 11:10 AM

## 2014-01-18 NOTE — Progress Notes (Signed)
  Date: 01/18/2014  Patient name: PARTHA HORTA  Medical record number: [id number]  Date of birth: 1956-04-14   This patient has been seen and the plan of care was discussed with the house staff. Please see their note for complete details. I concur with their findings with the following additions/corrections: I agree with need for Nutrition consult. In ESRD patients, protein intake of at least 1.2 g/kg body weight/d are needed. Will need to educate on Phos, Na, K intake. He is on Phoslo. Otherwise, medically stable for transfer to [place] when bed available.  Jonah Blue, DO, FACP [facility] 01/18/2014, 2:35 PM

## 2014-01-19 LAB — CBC
HCT: 28.4 % — ABNORMAL LOW (ref 39.0–52.0)
HEMOGLOBIN: 10 g/dL — AB (ref 13.0–17.0)
MCH: 26.7 pg (ref 26.0–34.0)
MCHC: 35.2 g/dL (ref 30.0–36.0)
MCV: 75.7 fL — AB (ref 78.0–100.0)
Platelets: 131 10*3/uL — ABNORMAL LOW (ref 150–400)
RBC: 3.75 MIL/uL — ABNORMAL LOW (ref 4.22–5.81)
RDW: 20.9 % — ABNORMAL HIGH (ref 11.5–15.5)
WBC: 7.5 10*3/uL (ref 4.0–10.5)

## 2014-01-19 LAB — RENAL FUNCTION PANEL
ALBUMIN: 2.6 g/dL — AB (ref 3.5–5.2)
BUN: 58 mg/dL — AB (ref 6–23)
CALCIUM: 9.3 mg/dL (ref 8.4–10.5)
CHLORIDE: 90 meq/L — AB (ref 96–112)
CO2: 23 meq/L (ref 19–32)
CREATININE: 6 mg/dL — AB (ref 0.50–1.35)
GFR calc Af Amer: 11 mL/min — ABNORMAL LOW (ref >90)
GFR calc non Af Amer: 9 mL/min — ABNORMAL LOW (ref >90)
Glucose, Bld: 90 mg/dL (ref 70–99)
Phosphorus: 5.4 mg/dL — ABNORMAL HIGH (ref 2.3–4.6)
Potassium: 5.1 mEq/L (ref 3.7–5.3)
Sodium: 133 mEq/L — ABNORMAL LOW (ref 137–147)

## 2014-01-19 MED ORDER — PENTAFLUOROPROP-TETRAFLUOROETH EX AERO
INHALATION_SPRAY | CUTANEOUS | Status: AC
  Administered 2014-01-19: 1 via TOPICAL
  Filled 2014-01-19: qty 103.5

## 2014-01-19 MED ORDER — HEPARIN SODIUM (PORCINE) 1000 UNIT/ML IJ SOLN
3000.0000 [IU] | Freq: Once | INTRAMUSCULAR | Status: AC
  Administered 2014-01-19: 3000 [IU] via INTRAVENOUS

## 2014-01-19 MED ORDER — DOXERCALCIFEROL 4 MCG/2ML IV SOLN
INTRAVENOUS | Status: AC
  Filled 2014-01-19: qty 2

## 2014-01-19 NOTE — Care Management Note (Signed)
Received call from [facility] @ [place] re possible transfer to that facility. They have one bed today with several candidates. This CM sent updated clinical info to fax # [phone]. CM medical director, Dr Bryn Gulling will also make contact with the [facility] to hopefully help facilitate this transfer. Will continue to follow and send info as needed.  Johny Shock RN MPH, case manager, [phone]

## 2014-01-19 NOTE — Progress Notes (Signed)
Subjective:  Pt seen and examined in AM. No acute events overnight and no current complaints currently undergoing HD. Pt eager to transfer to [place] in [place].    Objective: Vital signs in last 24 hours: Filed Vitals:   01/19/14 1030 01/19/14 1100 01/19/14 1130 01/19/14 1200  BP: 157/92 153/94 144/90 149/90  Pulse: 90 86 82 83  Temp:      TempSrc:      Resp:      Height:      Weight:      SpO2:       Weight change:   Intake/Output Summary (Last 24 hours) at 01/19/14 1251 Last data filed at 01/18/14 2140  Gross per 24 hour  Intake    600 ml  Output      0 ml  Net    600 ml   Physical Exam:  General appearance:  Malnourished Resp: Clear to ausculation in anterior fields   Cardio: normal rate and  regular rhythm   GI: normal BS , soft and non-tender, mild distension  Extremities: No edema Psych: cooperative, much improved mood  Lab Results: Basic Metabolic Panel:  Recent Labs Lab 01/14/14 1500  01/17/14 1433 01/19/14 0450  NA 132*  < > 138 133*  K 6.8*  < > 4.2 5.1  CL 88*  < > 93* 90*  CO2 16*  < > 30 23  GLUCOSE 120*  < > 113* 90  BUN 95*  < > 18 58*  CREATININE 8.55*  < > 2.98* 6.00*  CALCIUM 9.0  < > 8.9 9.3  PHOS 9.8*  --   --  5.4*  < > = values in this interval not displayed. Liver Function Tests:  Recent Labs Lab 01/14/14 1001 01/14/14 1500 01/19/14 0450  AST 75*  --   --   ALT 50  --   --   ALKPHOS 244*  --   --   BILITOT 1.9*  --   --   PROT 7.6  --   --   ALBUMIN 3.1* 3.0* 2.6*   CBC:  Recent Labs Lab 01/14/14 0826  01/17/14 0650 01/19/14 0808  WBC 10.3  < > 7.7 7.5  NEUTROABS 7.3  --   --   --   HGB 11.3*  < > 9.6* 10.0*  HCT 32.9*  < > 27.3* 28.4*  MCV 76.7*  < > 75.6* 75.7*  PLT 243  < > 126* 131*  < > = values in this interval not displayed.BNP:  Studies/Results: No results found. Medications: I have reviewed the patient's current medications. Scheduled Meds: . aspirin  81 mg Oral Daily  . calcium acetate  1,334 mg  Oral TID WC  . diltiazem  120 mg Oral Daily  . doxercalciferol  2 mcg Intravenous Q T,Th,Sa-HD  . feeding supplement (NEPRO CARB STEADY)  237 mL Oral BID BM  . folic acid  1 mg Oral Daily  . heparin  3,000 Units Dialysis Once in dialysis  . heparin  5,000 Units Subcutaneous 3 times per day  . lactulose  10 g Oral TID  . QUEtiapine  50 mg Oral QHS  . sertraline  25 mg Oral Daily  . sodium chloride  3 mL Intravenous Q12H  . tiotropium  18 mcg Inhalation Daily  . traZODone  100 mg Oral QHS   Continuous Infusions:  PRN Meds:.sodium chloride, sodium chloride, sodium chloride, acetaminophen, acetaminophen, albuterol, benzonatate, calcium carbonate (dosed in mg elemental calcium), camphor-menthol, docusate sodium,  guaiFENesin, heparin, hydrOXYzine, lidocaine (PF), lidocaine-prilocaine, ondansetron (ZOFRAN) IV, ondansetron, pentafluoroprop-tetrafluoroeth, sorbitol Assessment/Plan:  Assessment: 58 year old with ESRD on HD, decompensated liver cirrhosis, COPD, HTN, and AFib (not on coumadin), and PTSD with multiple ED admissions in the past 3 months who presented on 3/7 with active suicidal thoughts.   Plan:   Recurrent Severe Major Depression and PTSD withSuicidal Ideation - currently medically stable for transfer. Pt placed under IVC in ED. Pt previously hospitalized from 2/27- 3/4 and started on sertraline 25 mg daily and quetiapine 50 mg daily. Pt was to [facility] on 3/6 but did not. Pt with reported binge on crack cocaine over the last two days prior to admission.   -Appreciate Psychiatry consult --> acute psychiatric hospitalization for crisis stabilization, safety monitoring and medication management -Pt placed under IVC on 3/7 due to SI  -Continue sertraline 25 mg daily for anxiety  -Continue quetiapine 50 mg daily for agitation     -Continue trazadone 50 mg daily at bedtime for insomnia -Continue safety sitter at bedside  -Awaiting  availability of bed (psych or medicine)  [place] contact is Blane OharaLaurie Vasey [phone]   Hyperkalemia - resolved. Pt presented with K of 7.3 and  received bicarb, calcium gluconate, insulin and dextrose, and emergent HD. Etiology due to missing HD sessions (last on 3/4).  -Obtain troponin --> negative -Monitor on telemetry   Paroxysmal Atrial Fibrillation - currently NSR and normal HR.  CHADS score of 1 (HTN) on daily AP therapy. Per [facility] records he was previously on diltiazem ER 120 mg daily and for a period of time on coumad[facility]. -Continue 81 mg aspirin daily  -Continue PO diltiazem CD 120 mg daily  -Monitor on telemetry   Hypertension - Currently hypertensive.    -Continue PO diltiazem CD 120 mg daily   ESRD - [facility]. Last HD on 3/7 and 3/10.   -Regular diet with potassium restriction -Nephrology following, on TTS schedule  -Continue renavite daily and phoslo 1334 TID with meals  -Monitor daily weight and volume status   COPD - Pt currently with no acute exacerbation. Pt not on home oxygen, rescue or daily maintenance inhalers. [place] [facility] [place] and told he should be on home oxygen  -Albuterol nebulizer Q6hr PRN  -Spiriva 18mcg daily for maintenance therapy   Recent HCAP - Pt was recently hospitalized and treated for HCAP from 2/11-2/15 and was to take Levaquin 250 mg Q 48 hr for 14 days however not able to afford medication. Pt then hospitalized 2/20-2/23 and started on Augmentin which received for 3 days and to complete 7 day course. Chest xray on 3/7 unchanged with no leukocytosis, fever, productive cough, or dyspnea.  -Mucinex 600 mg BID PRN for cough  -Tessalon 100 mg PRN BID for cough   Chronic Liver Cirrhosis -stable.  Pt with liver function testing on admission that appears at baseline. No active bleeding. Etiology most likely due to chronic hepatitis C infection. No fever, encephalopathy, significant  leukocytosis, or worsening ascites to suggest SBP or warrant paracentesis.  -Continue lactulose 10g TID  -Continue hydroxyzine 25 mg Q 6hr PRN pruritis  -Continue folic acid 1 mg daily  -Phenergan PRN nausea (avoid zofran in setting of prolonged QT)   Hyperphosphatemia in setting of Secondary Hyperparathyroidism - Pt with corrected calcium of 9.9 and phosphorous 9.8 on 3/7. Etiology most likely secondary due to  ESRD.  -Continue phoslo TID with meals  -Continue weekly hectoral with HD   Severe Malnutrition - Pt with chronic hypoalbuminemia in setting of ESRD and liver cirrhosis. Pt with evidence of severe muscle wasting and 8% weight loss in last month with BMI of 20.12  -Regular diet with potassium restricton -Double trays and snacks  -Nepro Shake po TID   Chronic Hypervolemic Hyponatremia - Pt with chronic hyponatremia in setting of volume overload due to liver cirrhosis and ESRD.  -Continue HD on TTS schedule  Chronic Prolonged QT - Pt with QTc of 515 on 3/11, improved from 2/20 (643). Etiology likely due to medications vs chronic hypocalcemia in setting of secondary hyperparathyroidism.  -Avoid QT prolonging medications -Monitor QTc while on quetiapine, however no established guidelines to stop if QT prolongs   Chronic Anion Gap Metabolic Acidosis-  Etiology most likely chronic due to hyperphosphatemia in setting of ESRD vs starvation ketoacidosis (poor PO intake as unable to afford food).  -Obtain renal function panel in AM  -Renal diet   Chronic Thrombocytopenia - Platlet count above baseline of 100K. Pt with no recent active bleeding or bruising.  -Monitor for bleeding  -SQ heparin for DVT prophylaxis   Chronic Microcytic Anemia - Pt with Hg near baseline of 10. Etiology likely due to CKD. Pt receives ESA and erythropoetin weekly at HD.  -Monitor for bleeding  -Transfuse if Hg <7   CAD - Pt with history of MI per [facility] records. Currently without CP.  -Continue 81 mg aspirin  daily  -Due to cocaine use, hold BB   Social Issues - Currently living at extended stay hotel (?) and unable to afford any of his medications, however reported he would receive VA check March 1st. Pt was to [facility] with [facility] in [facility] on March 6 but did not attend. Pt requesting transfer to [place].  -Awaiting bed at [facility] in [facility]    Diet: Renal  DVT Ppx: SQ heparin TID  Code: Full Dispo: Awaiting bed availability for transfer to [facility]   The patient does not have a current PCP (No Pcp Per Patient) and does need an [facility] hospital follow-up appointment after discharge.  The patient does have transportation limitations that hinder transportation to clinic appointments.  .Services Needed at time of discharge: Y = Yes, Blank = No PT:   OT:   RN:   Equipment:   Other:     LOS: 5 days   Otis Brace, MD 01/19/2014, 12:51 PM

## 2014-01-19 NOTE — Progress Notes (Signed)
  Date: 01/19/2014  Patient name: Carl Benson  Medical record number: [id number]  Date of birth: Feb 11, 1956   This patient has been seen and the plan of care was discussed with the house staff. Please see their note for complete details. I concur with their findings with the following additions/corrections: Feels well. He wants to walk about the room, would ask Psych if they would be ok with this. Medically stable. Pending VA tx to acute inpatient psychiatric care.  Jonah Blue, DO, FACP Faculty [facility] Internal Medicine Residency Program 01/19/2014, 4:06 PM

## 2014-01-19 NOTE — Procedures (Signed)
I was present at this dialysis session, have reviewed the session itself and made  appropriate changes  [name] MD (pgr) [phone]    ([phone] 01/19/2014, 11:30 AM

## 2014-01-19 NOTE — Progress Notes (Signed)
[facility] Progress Note  Subjective:   Eating ok with double portions  Objective Filed Vitals:   01/18/14 1200 01/18/14 1733 01/18/14 2215 01/19/14 0609  BP: 149/82 146/90 157/91 137/82  Pulse: 80 83 90 88  Temp: 97.6 F (36.4 C) 98.6 F (37 C) 98.1 F (36.7 C) 98 F (36.7 C)  TempSrc: Oral Oral Oral Oral  Resp: 18 19 20 18   Height:      Weight:      SpO2: 97% 100% 98% 98%   Physical Exam goal 2 L General: emaciated supine on HD Heart: RRR no rub Lungs: no wheezes or rales Abdomen:soft NT, mild ascites Extremities: muscle wasting no edema Dialysis Access: right upper AVGG Qb 400  Dialysis: TTS [facility]  4h 55.5 kg 2/2.25 Bath Heparin 3000 RUA AVG  Hect 2 EPO 2800 Venofer 50/wk   Assessment/Plan: 1.Suicidal ideation/PTSD/Severe depression - [facility] 2 ESRD  TTS K 5.1- below edw 3 HTN/volume- vol well controlled, is below dry wt by 2kg now  4 Cirrhosis / ascites- stable 5 PAF on po dilt 120/d  6 COPD  7 Anemia start darbe 40/wk, Hb 9.6  8. SHPT - P down to 5.4 with meds; cont hectorol 9. Nutrition - renal diet with double portions/vit 10 Dispo- transfer to [place] recommended by psychiatry     Sheffield Slider, PA-C [facility] Beeper [phone] 01/19/2014,8:21 AM  LOS: 5 days   Pt seen, examined and agree w A/P as above.  [name] MD pager [phone]    cell [phone] 01/19/2014, 11:31 AM    Additional Objective Labs: Basic Metabolic Panel:  Recent Labs Lab 01/14/14 1500  01/17/14 0650 01/17/14 1433 01/19/14 0450  NA 132*  < > 132* 138 133*  K 6.8*  < > 6.1* 4.2 5.1  CL 88*  < > 90* 93* 90*  CO2 16*  < > 21 30 23   GLUCOSE 120*  < > 104* 113* 90  BUN 95*  < > 70* 18 58*  CREATININE 8.55*  < > 7.14* 2.98* 6.00*  CALCIUM 9.0  < > 9.2 8.9 9.3  PHOS 9.8*  --   --   --  5.4*  < > = values in this interval not displayed. Liver Function Tests:  Recent Labs Lab 01/14/14 1001 01/14/14 1500  01/19/14 0450  AST 75*  --   --   ALT 50  --   --   ALKPHOS 244*  --   --   BILITOT 1.9*  --   --   PROT 7.6  --   --   ALBUMIN 3.1* 3.0* 2.6*   CBC:  Recent Labs Lab 01/14/14 0826 01/14/14 1500 01/15/14 0610 01/17/14 0650  WBC 10.3 9.1 6.6 7.7  NEUTROABS 7.3  --   --   --   HGB 11.3* 10.6* 9.6* 9.6*  HCT 32.9* 30.3* 28.0* 27.3*  MCV 76.7* 76.3* 76.3* 75.6*  PLT 243 145* 130* 126*  Cardiac Enzymes:  Recent Labs Lab 01/17/14 1200  TROPONINI <0.30  Medications:   . aspirin  81 mg Oral Daily  . calcium acetate  1,334 mg Oral TID WC  . diltiazem  120 mg Oral Daily  . doxercalciferol  2 mcg Intravenous Q T,Th,Sa-HD  . feeding supplement (NEPRO CARB STEADY)  237 mL Oral BID BM  . folic acid  1 mg Oral Daily  . heparin  3,000 Units Dialysis Once in dialysis  . heparin  5,000 Units  Subcutaneous 3 times per day  . lactulose  10 g Oral TID  . pentafluoroprop-tetrafluoroeth      . QUEtiapine  50 mg Oral QHS  . sertraline  25 mg Oral Daily  . sodium chloride  3 mL Intravenous Q12H  . tiotropium  18 mcg Inhalation Daily  . traZODone  100 mg Oral QHS

## 2014-01-20 NOTE — Clinical Social Work Note (Signed)
Consult rec'd regarding placement at [facility]. CSW signing off as placement being handled by nurse case manager. Please reconsult if any other CSW services needed.  Genelle Bal, MSW, LCSW [phone]

## 2014-01-20 NOTE — Progress Notes (Addendum)
Subjective:  Pt seen and examined in AM. No acute events overnight. Pt wants to ambulate if possible. No other complaints at this time.    Objective: Vital signs in last 24 hours: Filed Vitals:   01/19/14 2052 01/20/14 0522 01/20/14 0821 01/20/14 1257  BP: 150/97 149/78 162/91 144/83  Pulse: 92 85 83 94  Temp: 98.7 F (37.1 C) 97.9 F (36.6 C) 97.3 F (36.3 C) 97.6 F (36.4 C)  TempSrc: Oral Oral Oral Oral  Resp: 20 20 20 20   Height:      Weight:      SpO2: 96% 96% 100% 100%   Weight change:   Intake/Output Summary (Last 24 hours) at 01/20/14 1336 Last data filed at 01/20/14 1325  Gross per 24 hour  Intake   1540 ml  Output      0 ml  Net   1540 ml   Physical Exam:  General appearance:  Malnourished Resp: Clear to ausculation in anterior fields   Cardio: normal rate and regular rhythm   GI: normal BS , soft and non-tender, mild distension  Extremities: No edema Psych: cooperative  Lab Results: Basic Metabolic Panel:  Recent Labs Lab 01/14/14 1500  01/17/14 1433 01/19/14 0450  NA 132*  < > 138 133*  K 6.8*  < > 4.2 5.1  CL 88*  < > 93* 90*  CO2 16*  < > 30 23  GLUCOSE 120*  < > 113* 90  BUN 95*  < > 18 58*  CREATININE 8.55*  < > 2.98* 6.00*  CALCIUM 9.0  < > 8.9 9.3  PHOS 9.8*  --   --  5.4*  < > = values in this interval not displayed. Liver Function Tests:  Recent Labs Lab 01/14/14 1001 01/14/14 1500 01/19/14 0450  AST 75*  --   --   ALT 50  --   --   ALKPHOS 244*  --   --   BILITOT 1.9*  --   --   PROT 7.6  --   --   ALBUMIN 3.1* 3.0* 2.6*   CBC:  Recent Labs Lab 01/14/14 0826  01/17/14 0650 01/19/14 0808  WBC 10.3  < > 7.7 7.5  NEUTROABS 7.3  --   --   --   HGB 11.3*  < > 9.6* 10.0*  HCT 32.9*  < > 27.3* 28.4*  MCV 76.7*  < > 75.6* 75.7*  PLT 243  < > 126* 131*  < > = values in this interval not displayed.BNP:  Studies/Results: No results found. Medications: I have reviewed the patient's current medications. Scheduled  Meds: . aspirin  81 mg Oral Daily  . calcium acetate  1,334 mg Oral TID WC  . diltiazem  120 mg Oral Daily  . doxercalciferol  2 mcg Intravenous Q T,Th,Sa-HD  . feeding supplement (NEPRO CARB STEADY)  237 mL Oral BID BM  . folic acid  1 mg Oral Daily  . heparin  3,000 Units Dialysis Once in dialysis  . heparin  5,000 Units Subcutaneous 3 times per day  . lactulose  10 g Oral TID  . QUEtiapine  50 mg Oral QHS  . sertraline  25 mg Oral Daily  . sodium chloride  3 mL Intravenous Q12H  . tiotropium  18 mcg Inhalation Daily  . traZODone  100 mg Oral QHS   Continuous Infusions:  PRN Meds:.sodium chloride, sodium chloride, sodium chloride, acetaminophen, acetaminophen, albuterol, benzonatate, calcium carbonate (dosed in mg elemental calcium),  camphor-menthol, docusate sodium, guaiFENesin, heparin, hydrOXYzine, lidocaine (PF), lidocaine-prilocaine, ondansetron (ZOFRAN) IV, ondansetron, pentafluoroprop-tetrafluoroeth, sorbitol Assessment/Plan:  Assessment: 58 year old with ESRD on HD, decompensated liver cirrhosis, COPD, HTN, and AFib (not on coumadin), and PTSD with multiple ED admissions in the past 3 months who presented on 3/7 with active suicidal thoughts.   Plan:   Recurrent Severe Major Depression and PTSD withSuicidal Ideation - currently medically stable for transfer. Pt placed under IVC in ED. Pt previously hospitalized from 2/27- 3/4 and started on sertraline 25 mg daily and quetiapine 50 mg daily. Pt [facility] [place] on 3/6 but did not. Pt with reported binge on crack cocaine over the last two days prior to admission.   -Appreciate Psychiatry consult --> acute psychiatric hospitalization for crisis stabilization, safety monitoring and medication management, to see again later today -Pt placed under IVC on 3/7 due to SI  -Continue sertraline 25 mg daily for anxiety  -Continue quetiapine 50 mg daily for agitation     -Continue trazadone 50  mg daily at bedtime for insomnia -Continue safety sitter at bedside, consider ambulation if ok with psychiatry  -Awaiting availability of bed (psych or medicine) [place] [place], currently on diversion. Contact is Blane OharaLaurie Vasey [phone]   Hyperkalemia - resolved. Pt presented with K of 7.3 and  received bicarb, calcium gluconate, insulin and dextrose, and emergent HD. Etiology due to missing HD sessions (last on 3/4).  -Obtain troponin --> negative -Monitor on telemetry   Paroxysmal Atrial Fibrillation - currently NSR and normal HR.  CHADS score of 1 (HTN) on daily AP therapy. Per [facility] records he was previously on diltiazem ER 120 mg daily and for a period of time on coumadin for [facility]. -Continue 81 mg aspirin daily  -Continue PO diltiazem CD 120 mg daily  -Monitor on telemetry   Hypertension - Currently hypertensive.    -Continue PO diltiazem CD 120 mg daily   ESRD - [facility]. Last HD on 3/7, 3/10, 3/12.   -Regular diet with potassium restriction -Nephrology following, on TTS schedule  -Continue renavite daily and phoslo 1334 TID with meals  -Monitor daily weight and volume status   COPD - Pt currently with no acute exacerbation. Pt not on home oxygen, rescue or daily maintenance inhalers. [place] [facility] [place] and told he should be on home oxygen  -Albuterol nebulizer Q6hr PRN  -Spiriva 18mcg daily for maintenance therapy   Recent HCAP - Pt was recently hospitalized and treated for HCAP from 2/11-2/15 and was to take Levaquin 250 mg Q 48 hr for 14 days however not able to afford medication. Pt then hospitalized 2/20-2/23 and started on Augmentin which received for 3 days and to complete 7 day course. Chest xray on 3/7 unchanged with no leukocytosis, fever, productive cough, or dyspnea.  -Mucinex 600 mg BID PRN for cough  -Tessalon 100 mg PRN BID for cough   Chronic Liver Cirrhosis -stable.  Pt with liver function testing on  admission that appears at baseline. No active bleeding. Etiology most likely due to chronic hepatitis C infection. No fever, encephalopathy, significant leukocytosis, or worsening ascites to suggest SBP or warrant paracentesis.  -Continue lactulose 10g TID  -Continue hydroxyzine 25 mg Q 6hr PRN pruritis  -Continue folic acid 1 mg daily  -Phenergan PRN nausea (avoid zofran in setting of prolonged QT)   Hyperphosphatemia in setting of Secondary Hyperparathyroidism - Pt  with corrected calcium of 9.9 and phosphorous 9.8 on 3/7. Etiology most likely secondary due to ESRD.  -Continue phoslo TID with meals  -Continue weekly hectoral with HD   Severe Malnutrition - Pt with chronic hypoalbuminemia in setting of ESRD and liver cirrhosis. Pt with evidence of severe muscle wasting and 8% weight loss in last month with BMI of 20.12  -Regular diet with potassium restricton -Double trays and snacks  -Nepro Shake po TID   Chronic Hypervolemic Hyponatremia - Pt with chronic hyponatremia in setting of volume overload due to liver cirrhosis and ESRD.  -Continue HD on TTS schedule  Chronic Prolonged QT - Pt with QTc of 515 on 3/11, improved from 2/20 (643). Etiology likely due to medications vs chronic hypocalcemia in setting of secondary hyperparathyroidism.  -Avoid QT prolonging medications -Monitor QTc while on quetiapine, however no established guidelines to stop if QT prolongs   Chronic Anion Gap Metabolic Acidosis-  Etiology most likely chronic due to hyperphosphatemia in setting of ESRD vs starvation ketoacidosis (poor PO intake as unable to afford food).  -Obtain renal function panel in AM  -Renal diet   Chronic Thrombocytopenia - Platlet count above baseline of 100K. Pt with no recent active bleeding or bruising.  -Monitor for bleeding  -SQ heparin for DVT prophylaxis   Chronic Microcytic Anemia - Pt with Hg near baseline of 10. Etiology likely due to CKD. Pt receives ESA and erythropoetin weekly  at HD.  -Monitor for bleeding  -Transfuse if Hg <7   CAD - Pt with history of MI per [facility] records. Currently without CP.  -Continue 81 mg aspirin daily  -Due to cocaine use, hold BB   Social Issues - Currently living at extended stay hotel (?) and unable to afford any of his medications, however reported he would receive VA check March 1st. Pt was to [facility] with [facility] on March 6 but did not attend. Pt requesting transfer to [place].  -Awaiting bed [facility]    Diet: Renal  DVT Ppx: SQ heparin TID  Code: Full Dispo: Awaiting bed availability for [facility]   The patient does not have a current PCP (No Pcp Per Patient) and does need an [facility] hospital follow-up appointment after discharge.  The patient does have transportation limitations that hinder transportation to clinic appointments.  .Services Needed at time of discharge: Y = Yes, Blank = No PT:   OT:   RN:   Equipment:   Other:     LOS: 6 days   Otis Brace, MD 01/20/2014, 1:36 PM

## 2014-01-20 NOTE — Care Management Note (Signed)
Spoke with Kendrick Fries of [facility], that facility is on diversion and not accepting new medical pts. Their Psy area is unable to handle this pt with HD needs due to the intensity of the other pts. They are currently checking with the [facility] facility. Will send updated clinical info today. Johny Shock RN MPH case manager, [phone]

## 2014-01-20 NOTE — Progress Notes (Signed)
  Date: 01/20/2014  Patient name: Carl Benson  Medical record number: [id number]  Date of birth: 10/04/1956   This patient has been seen and the plan of care was discussed with the house staff. Please see their note for complete details. I concur with their findings with the following additions/corrections: Feels well. He wants to ambulate outside his room. He feels his PTSD is affecting him by being "locked up". Please ask psychiatry if we can allow this safely with a sitter. Otherwise, medically stable. Pending [facility] transfer.  Jonah Blue, DO, FACP Faculty [facility] Internal Medicine Residency Program 01/20/2014, 2:52 PM

## 2014-01-20 NOTE — Consult Note (Signed)
Reason for Consult: depression and ptsd and suicidal thoughts Referring Physician: Dr. [name] is an 58 y.o. male.  HPI: Patient was seen for psychiatric consultation followup as requested by primary team. Patient has been suffering with depression and posttraumatic stress disorder and currently placed on involuntary commitment and waiting for psychiatric meds at the [facility]. Patient has been compliant with his medication for both his psychiatric condition under medical condition. Patient has been cooperative with treatment and requesting permission to walk in [place] along with staff members. Patient continued to have suicidal thoughts and symptoms of depression,  anxiety with flashbacks about his  past trauma. Patient regrets for his wrong decisions about drug of abuse and a previous suicide attempts reportedly trying to slash his forearm and tried to jump in a lake. Patient has denied current high degree of visual hallucinations, delusions and paranoia. Patient has suicidal ideation but contracts for safety while in the hospital.   Mental Status Examination: Patient appeared as per his stated age, and fairly groomed, and has fair eye contact. Patient has depressed and anxious mood and his affect was irritable. He has normal rate, rhythm, and volume of speech. His thought process is linear and goal directed. Patient has suicidal, and denied homicidal ideations, intentions or plans. Patient has no evidence of auditory or visual hallucinations, delusions, and paranoia. Patient has fair insight judgment and impulse control.  Past Medical History  Diagnosis Date  . COPD (chronic obstructive pulmonary disease)   . Hypertension   . Hep C w/ coma, chronic   . Irregular heartbeat   . ESRD (end stage renal disease) on dialysis     /notes 11/11/2013  . Smoker unmotivated to quit   . Active smoker   . PTSD (post-traumatic stress disorder)   . Shortness of breath     Past Surgical  History  Procedure Laterality Date  . Paracentesis  ~ 10/2013    Archie Endo 11/11/2013    Family History  Problem Relation Age of Onset  . Heart disease Mother   . Hypertension Mother   . Diabetes Mother     Social History:  reports that he has been smoking Cigarettes.  He has a 7.5 pack-year smoking history. He has never used smokeless tobacco. He reports that he uses illicit drugs ("Crack" cocaine, Cocaine, Marijuana, and Other-see comments). He reports that he does not drink alcohol.  Allergies: No Known Allergies  Medications: I have reviewed the patient's current medications.  Results for orders placed during the hospital encounter of 01/14/14 (from the past 48 hour(s))  RENAL FUNCTION PANEL     Status: Abnormal   Collection Time    01/19/14  4:50 AM      Result Value Ref Range   Sodium 133 (*) 137 - 147 mEq/L   Potassium 5.1  3.7 - 5.3 mEq/L   Comment: DELTA CHECK NOTED   Chloride 90 (*) 96 - 112 mEq/L   CO2 23  19 - 32 mEq/L   Glucose, Bld 90  70 - 99 mg/dL   BUN 58 (*) 6 - 23 mg/dL   Comment: DELTA CHECK NOTED   Creatinine, Ser 6.00 (*) 0.50 - 1.35 mg/dL   Comment: DELTA CHECK NOTED   Calcium 9.3  8.4 - 10.5 mg/dL   Phosphorus 5.4 (*) 2.3 - 4.6 mg/dL   Albumin 2.6 (*) 3.5 - 5.2 g/dL   GFR calc non Af Amer 9 (*) >90 mL/min   GFR calc Af Amer 11 (*) >  90 mL/min   Comment: (NOTE)     The eGFR has been calculated using the CKD EPI equation.     This calculation has not been validated in all clinical situations.     eGFR's persistently <90 mL/min signify possible Chronic Kidney     Disease.  CBC     Status: Abnormal   Collection Time    01/19/14  8:08 AM      Result Value Ref Range   WBC 7.5  4.0 - 10.5 K/uL   RBC 3.75 (*) 4.22 - 5.81 MIL/uL   Hemoglobin 10.0 (*) 13.0 - 17.0 g/dL   HCT 28.4 (*) 39.0 - 52.0 %   MCV 75.7 (*) 78.0 - 100.0 fL   MCH 26.7  26.0 - 34.0 pg   MCHC 35.2  30.0 - 36.0 g/dL   RDW 20.9 (*) 11.5 - 15.5 %   Platelets 131 (*) 150 - 400 K/uL     No results found.  Positive for anxiety, bad mood, depression, illegal drug usage, mood swings and sleep disturbance Blood pressure 150/86, pulse 92, temperature 98.4 F (36.9 C), temperature source Oral, resp. rate 20, height $RemoveBe'5\' 5"'lofqBmmAf$  (1.651 m), weight 52.3 kg (115 lb 4.8 oz), SpO2 96.00%.   Assessment/Plan: Posttraumatic stress disorder Cocaine abuse versus dependence  Recommendation:  Recommended acute psychiatric hospitalization for crisis stabilization, safety monitoring and medication management Patient will be referred to be [facility] in [place] as patient requested because he has 100% disability benefits We'll continue his current medication management, Zoloft 25 mg daily for depression and anxiety, Seroquel 50 mg at bedtime for agitation and trazodone 100 mg at bedtime for insomnia  Continue safety sitter and may take a walk from time to time if his medical condition permits Appreciate psychiatric consultation and followup as clinically required  Orris Perin,JANARDHAHA R. 01/20/2014, 6:42 PM

## 2014-01-20 NOTE — Progress Notes (Signed)
Subjective: Wanting breakfast, Wants to walk halls/ tolerated HD yesterday Objective Vital signs in last 24 hours: Filed Vitals:   01/19/14 1300 01/19/14 2052 01/20/14 0522 01/20/14 0821  BP: 151/84 150/97 149/78 162/91  Pulse: 88 92 85 45  Temp: 99.2 F (37.3 C) 98.7 F (37.1 C) 97.9 F (36.6 C) 97.3 F (36.3 C)  TempSrc: Oral Oral Oral Oral  Resp: 21 20 20 20   Height:      Weight: 52.3 kg (115 lb 4.8 oz)     SpO2:  96% 96% 100%   Labs: Basic Metabolic Panel:  Recent Labs Lab 01/14/14 1500  01/17/14 0650 01/17/14 1433 01/19/14 0450  NA 132*  < > 132* 138 133*  K 6.8*  < > 6.1* 4.2 5.1  CL 88*  < > 90* 93* 90*  CO2 16*  < > 21 30 23   GLUCOSE 120*  < > 104* 113* 90  BUN 95*  < > 70* 18 58*  CREATININE 8.55*  < > 7.14* 2.98* 6.00*  CALCIUM 9.0  < > 9.2 8.9 9.3  PHOS 9.8*  --   --   --  5.4*  < > = values in this interval not displayed. Liver Function Tests:  Recent Labs Lab 01/14/14 1001 01/14/14 1500 01/19/14 0450  AST 75*  --   --   ALT 50  --   --   ALKPHOS 244*  --   --   BILITOT 1.9*  --   --   PROT 7.6  --   --   ALBUMIN 3.1* 3.0* 2.6*  CBC:  Recent Labs Lab 01/14/14 0826 01/14/14 1500 01/15/14 0610 01/17/14 0650 01/19/14 0808  WBC 10.3 9.1 6.6 7.7 7.5  NEUTROABS 7.3  --   --   --   --   HGB 11.3* 10.6* 9.6* 9.6* 10.0*  HCT 32.9* 30.3* 28.0* 27.3* 28.4*  MCV 76.7* 76.3* 76.3* 75.6* 75.7*  PLT 243 145* 130* 126* 131*   Cardiac Enzymes:  Recent Labs Lab 01/17/14 1200  TROPONINI <0.30  Medications:   . aspirin  81 mg Oral Daily  . calcium acetate  1,334 mg Oral TID WC  . diltiazem  120 mg Oral Daily  . doxercalciferol  2 mcg Intravenous Q T,Th,Sa-HD  . feeding supplement (NEPRO CARB STEADY)  237 mL Oral BID BM  . folic acid  1 mg Oral Daily  . heparin  3,000 Units Dialysis Once in dialysis  . heparin  5,000 Units Subcutaneous 3 times per day  . lactulose  10 g Oral TID  . QUEtiapine  50 mg Oral QHS  . sertraline  25 mg Oral Daily   . sodium chloride  3 mL Intravenous Q12H  . tiotropium  18 mcg Inhalation Daily  . traZODone  100 mg Oral QHS    Physical Exam: General: alert thin/ emaciated BM NAD Heart: RRR no rub  Lungs: CTA bilat Abdomen: BS pos.soft NT,  With mild ascites  Extremities: muscle wasting no pedal  edema  Dialysis Access: right upper AVGG pos bruit  Dialysis: TTS [facility]  4h 55.5 kg 2/2.25 Bath Heparin 3000 RUA AVG  Hect 2 EPO 2800 Venofer 50/wk   Assessment/Plan:  1.Suicidal ideation/PTSD/Severe depression - planning [facility] when bed available  2 ESRD TTS K 5.1- below edw  3 HTN/volume- vol well controlled, is below dry wt by 3kg now  4 Cirrhosis / ascites- stable  5 PAF NS by exam on po dilt 120/d  +  asa  6 Anemia start darbe 40/wk, Hb 9.6  7 SHPT - P down to 5.4 from 9.8 with meds; cont hectorol  8. Nutrition - renal diet with double portions/vit  9COPD satble 10 Dispo- transfer to [place] recommended by psychiatry    Lenny Pastel, PA-C [facility] Beeper [phone] 01/20/2014,8:26 AM  LOS: 6 days   Pt seen, examined and agree w A/P as above.  [name] MD pager [phone]    cell [phone] 01/20/2014, 12:13 PM

## 2014-01-21 LAB — RENAL FUNCTION PANEL
Albumin: 2.8 g/dL — ABNORMAL LOW (ref 3.5–5.2)
BUN: 61 mg/dL — AB (ref 6–23)
CALCIUM: 9.8 mg/dL (ref 8.4–10.5)
CO2: 22 mEq/L (ref 19–32)
Chloride: 91 mEq/L — ABNORMAL LOW (ref 96–112)
Creatinine, Ser: 5.51 mg/dL — ABNORMAL HIGH (ref 0.50–1.35)
GFR calc Af Amer: 12 mL/min — ABNORMAL LOW (ref >90)
GFR calc non Af Amer: 10 mL/min — ABNORMAL LOW (ref >90)
Glucose, Bld: 123 mg/dL — ABNORMAL HIGH (ref 70–99)
PHOSPHORUS: 5.3 mg/dL — AB (ref 2.3–4.6)
Potassium: 4.9 mEq/L (ref 3.7–5.3)
Sodium: 133 mEq/L — ABNORMAL LOW (ref 137–147)

## 2014-01-21 LAB — CBC
HCT: 28.7 % — ABNORMAL LOW (ref 39.0–52.0)
Hemoglobin: 10.1 g/dL — ABNORMAL LOW (ref 13.0–17.0)
MCH: 26.6 pg (ref 26.0–34.0)
MCHC: 35.2 g/dL (ref 30.0–36.0)
MCV: 75.5 fL — AB (ref 78.0–100.0)
Platelets: 160 10*3/uL (ref 150–400)
RBC: 3.8 MIL/uL — ABNORMAL LOW (ref 4.22–5.81)
RDW: 20.4 % — AB (ref 11.5–15.5)
WBC: 9 10*3/uL (ref 4.0–10.5)

## 2014-01-21 MED ORDER — DOXERCALCIFEROL 4 MCG/2ML IV SOLN
INTRAVENOUS | Status: AC
  Administered 2014-01-21: 2 ug via INTRAVENOUS
  Filled 2014-01-21: qty 2

## 2014-01-21 MED ORDER — HYDROXYZINE HCL 25 MG PO TABS
ORAL_TABLET | ORAL | Status: AC
  Filled 2014-01-21: qty 1

## 2014-01-21 NOTE — Progress Notes (Signed)
[facility] Progress Note   Subjective: Says his stomach is swollen, needs to "draw the fluid off"  Filed Vitals:   01/21/14 0856 01/21/14 0904 01/21/14 0909 01/21/14 0930  BP: 152/97 156/102  163/95  Pulse: 93 92  88  Temp: 97.9 F (36.6 C)     TempSrc: Oral     Resp: 21 20 20 18   Height:      Weight: 56.2 kg (123 lb 14.4 oz)     SpO2: 98%      Exam: No distress, calm No jvd Chest clear bilat RRR no MRG Abd more distended with ascites 2-3+, nontender No leg or UE edema Neuro is nf, Ox3   Dialysis: TTS [facility]  4h   New dry wt ~50kg (was 55.5 kg)   2/2.25 Bath   Heparin 3000   RUA AVG  Hect 2  EPO 2800  Venofer 50/wk   Assessment/Plan:  1.Suicidal ideation/PTSD/depression - [facility]  2 ESRD on hemodialysis 3 HTN/volume- vol excess now, wt is up and ascites worse 4 Cirrhosis 5 PAF- in sinus rhythm, po dilt 120/d  + asa  6 Anemia darbe 40/wk Hb 9.6  7 SHPT - P down to 5.4 from 9.8 with meds; cont hectorol  8 Nutrition - renal diet with double portions/vit   Plan- large UF with HD today, if ascites doesn't resolve with UF may need paracentesis    [name] Chapman Matteucci MD  pager [phone]    cell [phone]  01/21/2014, 9:59 AM     Recent Labs Lab 01/14/14 1500  01/17/14 1433 01/19/14 0450 01/21/14 0909  NA 132*  < > 138 133* 133*  K 6.8*  < > 4.2 5.1 4.9  CL 88*  < > 93* 90* 91*  CO2 16*  < > 30 23 22   GLUCOSE 120*  < > 113* 90 123*  BUN 95*  < > 18 58* 61*  CREATININE 8.55*  < > 2.98* 6.00* 5.51*  CALCIUM 9.0  < > 8.9 9.3 9.8  PHOS 9.8*  --   --  5.4* 5.3*  < > = values in this interval not displayed.  Recent Labs Lab 01/14/14 1001 01/14/14 1500 01/19/14 0450 01/21/14 0909  AST 75*  --   --   --   ALT 50  --   --   --   ALKPHOS 244*  --   --   --   BILITOT 1.9*  --   --   --   PROT 7.6  --   --   --   ALBUMIN 3.1* 3.0* 2.6* 2.8*    Recent Labs Lab 01/17/14 0650 01/19/14 0808 01/21/14 0909  WBC 7.7  7.5 9.0  HGB 9.6* 10.0* 10.1*  HCT 27.3* 28.4* 28.7*  MCV 75.6* 75.7* 75.5*  PLT 126* 131* 160   . aspirin  81 mg Oral Daily  . calcium acetate  1,334 mg Oral TID WC  . diltiazem  120 mg Oral Daily  . doxercalciferol  2 mcg Intravenous Q T,Th,Sa-HD  . feeding supplement (NEPRO CARB STEADY)  237 mL Oral BID BM  . folic acid  1 mg Oral Daily  . heparin  3,000 Units Dialysis Once in dialysis  . heparin  5,000 Units Subcutaneous 3 times per day  . hydrOXYzine      . lactulose  10 g Oral TID  . QUEtiapine  50 mg Oral QHS  . sertraline  25 mg Oral Daily  .  sodium chloride  3 mL Intravenous Q12H  . tiotropium  18 mcg Inhalation Daily  . traZODone  100 mg Oral QHS     sodium chloride, sodium chloride, sodium chloride, acetaminophen, acetaminophen, albuterol, benzonatate, calcium carbonate (dosed in mg elemental calcium), camphor-menthol, docusate sodium, guaiFENesin, heparin, hydrOXYzine, lidocaine (PF), lidocaine-prilocaine, ondansetron (ZOFRAN) IV, ondansetron, pentafluoroprop-tetrafluoroeth, sorbitol

## 2014-01-21 NOTE — Progress Notes (Signed)
Subjective: States that he feels good. Undergoing HD. His abdomen is still distended but he is hypervolemic. Denies CP, SOB, abdominal pain, constipation, N/V/D.   Objective: Vital signs in last 24 hours: Filed Vitals:   01/21/14 1200 01/21/14 1230 01/21/14 1300 01/21/14 1311  BP: 120/72 148/92 140/92 131/85  Pulse: 92 92 91 90  Temp:    98 F (36.7 C)  TempSrc:    Oral  Resp: 17 19 29 30   Height:      Weight:      SpO2:    98%   Weight change:   Intake/Output Summary (Last 24 hours) at 01/21/14 1343 Last data filed at 01/21/14 1311  Gross per 24 hour  Intake    360 ml  Output   5000 ml  Net  -4640 ml   Vitals reviewed. General: resting in bed, in NAD. Malnourished  HEENT:no scleral icterus, MMM Cardiac: RRR, no rubs, murmurs or gallops Pulm: clear to auscultation bilaterally, no wheezes, rales, or rhonchi Abd: soft, nontender, mild distention, BS present Ext: warm and well perfused, no pedal edema Neuro: alert and oriented X3, cooperative, follow commands  Lab Results: Basic Metabolic Panel:  Recent Labs Lab 01/19/14 0450 01/21/14 0909  NA 133* 133*  K 5.1 4.9  CL 90* 91*  CO2 23 22  GLUCOSE 90 123*  BUN 58* 61*  CREATININE 6.00* 5.51*  CALCIUM 9.3 9.8  PHOS 5.4* 5.3*   Liver Function Tests:  Recent Labs Lab 01/19/14 0450 01/21/14 0909  ALBUMIN 2.6* 2.8*   CBC:  Recent Labs Lab 01/19/14 0808 01/21/14 0909  WBC 7.5 9.0  HGB 10.0* 10.1*  HCT 28.4* 28.7*  MCV 75.7* 75.5*  PLT 131* 160   Cardiac Enzymes:  Recent Labs Lab 01/17/14 1200  TROPONINI <0.30   Medications: I have reviewed the patient's current medications. Scheduled Meds: . aspirin  81 mg Oral Daily  . calcium acetate  1,334 mg Oral TID WC  . diltiazem  120 mg Oral Daily  . doxercalciferol  2 mcg Intravenous Q T,Th,Sa-HD  . feeding supplement (NEPRO CARB STEADY)  237 mL Oral BID BM  . folic acid  1 mg Oral Daily  . heparin  3,000 Units Dialysis Once in dialysis  .  heparin  5,000 Units Subcutaneous 3 times per day  . hydrOXYzine      . lactulose  10 g Oral TID  . QUEtiapine  50 mg Oral QHS  . sertraline  25 mg Oral Daily  . sodium chloride  3 mL Intravenous Q12H  . tiotropium  18 mcg Inhalation Daily  . traZODone  100 mg Oral QHS   Continuous Infusions:  PRN Meds:.sodium chloride, sodium chloride, sodium chloride, acetaminophen, acetaminophen, albuterol, benzonatate, calcium carbonate (dosed in mg elemental calcium), camphor-menthol, docusate sodium, guaiFENesin, heparin, hydrOXYzine, lidocaine (PF), lidocaine-prilocaine, ondansetron (ZOFRAN) IV, ondansetron, pentafluoroprop-tetrafluoroeth, sorbitol Assessment/Plan: 58 year old with ESRD on HD, decompensated liver cirrhosis, COPD, HTN, and AFib (not on coumadin), and PTSD with multiple ED admissions in the past 3 months who presented on 3/7 with active suicidal thoughts.   Plan:  Recurrent Severe Major Depression and PTSD withSuicidal Ideation - currently medically stable for transfer. Pt placed under IVC in ED.  -Appreciate Psychiatry consult --> acute psychiatric hospitalization for crisis stabilization, safety monitoring and medication management, awaiting transfer to [place] in [place] or [place] -Pt placed under IVC on 3/7 due to SI  -Continue sertraline 25 mg daily for anxiety  -Continue quetiapine 50 mg daily for agitation  -  Continue trazadone 50 mg daily at bedtime for insomnia  -Continue safety sitter at bedside, consider ambulation per Psychiatry  -Awaiting availability of bed (psych or medicine) from [place] in [facility] ([place] is [place]ane [place] [phone] ext [phone]), or [place]. Case manager following case this weekend.   Hyperkalemia - resolved. Pt presented with K of 7.3 and received bicarb, calcium gluconate, insulin and dextrose, and emergent HD. Etiology due to missing HD sessions (last on 3/4).  -Monitor on telemetry   Paroxysmal Atrial Fibrillation - currently NSR and normal HR. CHADS score  of 1 (HTN) on daily AP therapy. [facility] he was previously on diltiazem ER 120 mg daily and for a period of time on coumadin for [facility] therapy.  -Continue 81 mg aspirin daily  -Continue PO diltiazem CD 120 mg daily  -Monitor on telemetry   Hypertension - BP of 131/85. -Continue PO diltiazem CD 120 mg daily   ESRD - Pt on TTS schedule at [facility]. Last HD on 3/7, 3/10, 3/12.  -Regular diet with potassium restriction  -Nephrology following, on TTS schedule  -Continue renavite daily and phoslo 1334 TID with meals  -Monitor daily weight and volume status   COPD - Pt currently with no acute exacerbation. Pt not on home oxygen, rescue or daily maintenance inhalers. Reports having PFT's at [place] in [place] and told he should be on home oxygen  -Albuterol nebulizer Q6hr PRN  -Spiriva daily for maintenance therapy   Recent HCAP - Pt was recently hospitalized and treated for HCAP from 2/11-2/15 and was to take Levaquin 250 mg Q 48 hr for 14 days however not able to afford medication. Pt then hospitalized 2/20-2/23 and started on Augmentin which received for 3 days and to complete 7 day course. Chest xray on 3/7 unchanged with no leukocytosis, fever, productive cough, or dyspnea.  -Mucinex 600 mg BID PRN for cough  -Tessalon 100 mg PRN BID for cough   Chronic Liver Cirrhosis -stable. Pt with liver function testing on admission that appears at baseline. No active bleeding. Etiology most likely due to chronic hepatitis C infection. No fever, encephalopathy, significant leukocytosis, or worsening ascites to suggest SBP or warrant paracentesis.  -Continue lactulose 10g TID  -Continue hydroxyzine 25 mg Q 6hr PRN pruritis  -Continue folic acid 1 mg daily  -Phenergan PRN nausea (avoid zofran in setting of prolonged QT)   Hyperphosphatemia in setting of Secondary Hyperparathyroidism - Pt with corrected calcium of 9.9 and phosphorous 9.8 on 3/7. Etiology most likely secondary due to ESRD.    -Continue phoslo TID with meals  -Continue weekly hectoral with HD   Severe Malnutrition - Pt with chronic hypoalbuminemia in setting of ESRD and liver cirrhosis. Pt with evidence of severe muscle wasting and 8% weight loss in last month with BMI of 20.12  -Regular diet with potassium restricton  -Double trays and snacks  -Nepro Shake po TID   Chronic Hypervolemic Hyponatremia - Pt with chronic hyponatremia in setting of volume overload due to liver cirrhosis and ESRD.  -Continue HD on TTS schedule   Chronic Prolonged QT - Pt with QTc of 515 on 3/11, improved from 2/20 (643). Etiology likely due to medications vs chronic hypocalcemia in setting of secondary hyperparathyroidism.  -Avoid QT prolonging medications  -Monitor QTc while on quetiapine, however no established guidelines to stop if QT prolongs   Chronic Anion Gap Metabolic Acidosis- Etiology most likely chronic due to hyperphosphatemia in setting of ESRD vs starvation ketoacidosis (poor PO intake as  unable to afford food).  -Obtain renal function panel in AM  -Renal diet   Chronic Thrombocytopenia - Platlet count above baseline of 100K. Pt with no recent active bleeding or bruising.  -Monitor for bleeding  -SQ heparin for DVT prophylaxis   Chronic Microcytic Anemia - Pt with Hg near baseline of 10. Etiology likely due to CKD. Pt receives ESA and erythropoetin weekly at HD.  -Monitor for bleeding  -Transfuse if Hg <7   CAD - [facility]. Currently without CP.  -Continue 81 mg aspirin daily  -Due to cocaine use, hold BB   Social Issues - Currently living at extended stay hotel (?) and unable to afford any of his medications, however reported he would receive VA check March 1st. [facility] [place] [facility] but did not attend. Pt requesting [place] [facility].  -[facility] [place]  Diet: Regular with potassium restriction, double portions  DVT Ppx: SQ  heparin TID   Code: Full   Dispo: [facility] [place] or [facility] [place]  Dispo: Disposition is deferred at this time, awaiting improvement of current medical problems.  Anticipated discharge in approximately 3-4 day(s).   The patient does not have a current PCP (No Pcp Per Patient) and [facility] after discharge.  The patient does have transportation limitations that hinder transportation to clinic appointments.  .Services Needed at time of discharge: Y = Yes, Blank = No PT:   OT:   RN:   Equipment:   Other:     LOS: 7 days   Ky BarbanSolianny D Miller [place], MD 01/21/2014, 1:43 PM

## 2014-01-21 NOTE — Procedures (Signed)
I was present at this dialysis session, have reviewed the session itself and made  appropriate changes  [name] MD (pgr) [phone]    ([phone] 01/21/2014, 9:59 AM

## 2014-01-21 NOTE — Progress Notes (Signed)
CARE MANAGEMENT NOTE 01/21/2014  Patient:  Margie BilletGRAY,Jacon A   Account Number:  [id number]  Date Initiated:  01/16/2014  Documentation initiated by:  Darlyne RussianOLAND,ANNETTE  Subjective/Objective Assessment:   admitted with hyperkalemia, missed HD,  admitting to suicidal thoughts  ESRD/HD on TTS     Action/Plan:   psych consult  NCM to follow for disposition   Anticipated DC Date:     Anticipated DC Plan:    In-house referral  Clinical Social Worker      DC Planning Services  CM consult      Choice offered to / List presented to:             Status of service:  In process, will continue to follow Medicare Important Message given?   (If response is "NO", the following Medicare IM given date fields will be blank) Date Medicare IM given:   Date Additional Medicare IM given:    Discharge Disposition:    Per UR Regulation:    If discussed at Long Length of Stay Meetings, dates discussed:    Comments:  01/21/14 17:49 CM received call from MD to see [facility]  Call made to Kendrick FriesLaurie Vesey of [facility] to check on status but no answer as it is closed on the weekend.  Will continue to follow on Monday when VA is open for bed placement arrangement.  Freddy JakschSarah Fadi Menter, BSN, [place]CM [phone].    01/18/2014  0830 Darlyne RussianAnnette Roland RN, CCM [phone] [facility]/ Vernona RiegerLaura they do not have any beds, she has looked at other facilities [place], [place], discussed case with Psych. She requests updated clinicals FAX: [phone] Dr Johna Rolesabbani updated.  01/17/2014  1100 Darlyne Russiannnette Roland RN,CCM  [phone] [facility]:   Silva BandyLaura Veasley  [phone]   ext [phone] spoke with Jacki ConesLaurie regarding transfer status. They do not have a bed at this time. Requested faxed notes: facesheet, IVC, psych notes, MD progress note, Renal notes, vital sign, labs FAX:  [phone] notes faxed  01/17/2014 [address] Annette Roland [place]bury[place]RN, [place]CCM [phone] [facility] [phone] ext [phone]  Ms [place]  left voice mail message,  transfer packet completed and faxed 01/16/2014  01/18/2104  0930  Darlyne Russiannnette Roland RN, [place]CCM [phone] Per Dr Johna Rolesabbani the completed transfer packet was faxed 01/16/2014 to Beryle QuantJacqueline Jacobs [phone] she spoke with MD at [facility] about the transfer  01/16/2014  [address] Annette Roland RN, [place]CCM [phone] [facility]  [phone]  Ms [place]az[place] spoke with her regarding patient transfer per our MD. She will load the patient and call back. Provided fax number for NCM.  01/16/2014  1500 Darlyne RussianAnnette Roland RN, [place]CCM [phone] Per Dr Johna Rolesabbani she [facility] with [facility] regarding transport of patient to their facility contact person Ms Swaz[place]Jordan  [phone] ext  [phone]  01/16/2014  [address] Annette Roland RN, [place]CCM [phone] [facility] status and overview of recent hospital stays 2/11-2/15  SOB, HCAP, ESRD 2/20 - 2/23 small bowel obstruction 2/26 -2/27 fluid overload 2/27 - 3/4 depression, PTSD 3/7 to present admit with suicidal ideations  she noted he is 80% service he cannot transfer to Mental Health due to dialysis provided name of SW: Raeanne GathersRonita Bland [phone]  01/16/2014 [address] Annette Roland GoteboRN, [place]CCM [phone]  [facility] [place] [phone] ext [phone]  Called and left voice mail message requesting return phone call to discuss patient's VA benefits and current medical status. [place] [facility],  new to [place], initial visit at [facility] on 01/13/2014.

## 2014-01-21 NOTE — Progress Notes (Signed)
Pt requesting to speak to MD to discuss his plan for discharge stating he has not spoken with any MD today. Message sent to rounding team. Received call that pt has been rounded on. The plan is that he is waiting on a bed at the [facility] hospital either in [place] or [place]. MD reinforced that pt is involuntarily committed and may not leave. If he does attempt to leave, security is to be called stat. Information discussed with pt. Pt verbalized understanding and stated to plans to try to leave hospital. Continue to monitor. Hortencia Conradi, RN

## 2014-01-22 NOTE — Progress Notes (Signed)
Subjective:  Pt seen and examined in AM. No acute events overnight. Pt reports his abdominal distension is better after HD yesterday. He has been eating more. He has had pruritis of his legs. No other complaints at this time. [facility] [place] [facility] and would like to know when this will happen.    Objective: Vital signs in last 24 hours: Filed Vitals:   01/21/14 1655 01/21/14 2117 01/22/14 0639 01/22/14 0736  BP: 139/79 146/89 139/89   Pulse: 92 91 89   Temp: 98.8 F (37.1 C) 98.5 F (36.9 C) 98.9 F (37.2 C)   TempSrc: Oral Oral Oral   Resp: 17 20 19    Height:      Weight:      SpO2: 94% 95% 96% 98%   Weight change:   Intake/Output Summary (Last 24 hours) at 01/22/14 0741 Last data filed at 01/21/14 1914  Gross per 24 hour  Intake    363 ml  Output   5000 ml  Net  -4637 ml   Physical Exam:  General appearance:  Malnourished Resp: Clear to ausculation in anterior fields   Cardio: normal rate and regular rhythm   GI: normal BS , soft and non-tender, mild distension  Extremities: No edema Psych: cooperative  Lab Results: Basic Metabolic Panel:  Recent Labs Lab 01/19/14 0450 01/21/14 0909  NA 133* 133*  K 5.1 4.9  CL 90* 91*  CO2 23 22  GLUCOSE 90 123*  BUN 58* 61*  CREATININE 6.00* 5.51*  CALCIUM 9.3 9.8  PHOS 5.4* 5.3*   Liver Function Tests:  Recent Labs Lab 01/19/14 0450 01/21/14 0909  ALBUMIN 2.6* 2.8*   CBC:  Recent Labs Lab 01/19/14 0808 01/21/14 0909  WBC 7.5 9.0  HGB 10.0* 10.1*  HCT 28.4* 28.7*  MCV 75.7* 75.5*  PLT 131* 160  BNP:  Studies/Results: No results found. Medications: I have reviewed the patient's current medications. Scheduled Meds: . aspirin  81 mg Oral Daily  . calcium acetate  1,334 mg Oral TID WC  . diltiazem  120 mg Oral Daily  . doxercalciferol  2 mcg Intravenous Q T,Th,Sa-HD  . feeding supplement (NEPRO CARB STEADY)  237 mL Oral BID BM  . folic acid  1 mg Oral Daily  . heparin  3,000 Units  Dialysis Once in dialysis  . heparin  5,000 Units Subcutaneous 3 times per day  . lactulose  10 g Oral TID  . QUEtiapine  50 mg Oral QHS  . sertraline  25 mg Oral Daily  . sodium chloride  3 mL Intravenous Q12H  . tiotropium  18 mcg Inhalation Daily  . traZODone  100 mg Oral QHS   Continuous Infusions:  PRN Meds:.sodium chloride, sodium chloride, sodium chloride, acetaminophen, acetaminophen, albuterol, benzonatate, calcium carbonate (dosed in mg elemental calcium), camphor-menthol, docusate sodium, guaiFENesin, heparin, hydrOXYzine, lidocaine (PF), lidocaine-prilocaine, ondansetron (ZOFRAN) IV, ondansetron, pentafluoroprop-tetrafluoroeth, sorbitol Assessment/Plan:  Assessment: 58 year old with ESRD on HD, decompensated liver cirrhosis, COPD, HTN, and AFib (not on coumadin), and PTSD with multiple ED admissions in the past 3 months who presented on 3/7 with active suicidal thoughts.   Plan:   Recurrent Severe Major Depression and PTSD withSuicidal Ideation - currently medically stable for transfer. Pt placed under IVC in ED on admission. Pt previously hospitalized from 2/27- 3/4 and started on sertraline 25 mg daily and quetiapine 50 mg daily. Pt [facility] [place] [facility] but did not.  Pt with reported binge on crack cocaine two days prior to admission.   -Appreciate Psychiatry consult --> acute psychiatric hospitalization for crisis stabilization, safety monitoring and medication management, to see again later today -Pt placed under IVC on 3/7 due to SI  -Continue sertraline 25 mg daily for anxiety  -Continue quetiapine 50 mg daily for agitation     -Continue trazadone 50 mg daily at bedtime for insomnia -Continue safety sitter at bedside, ambulation from time to time is ok with psychiatry  -Awaiting availability of bed (psych or medicine) from [place] in [place] or [place], currently on diversion at [facility]. [place] is Blane Ohara [phone] ext  [phone]   ESRD - Pt on TTS schedule at [facility]. HD on 3/7, 3/10, 3/12, 3/14   -Regular diet with potassium restriction -Nephrology following, on TTS schedule  -Continue renavite daily and phoslo 1334 TID with meals  -Hectoral with HD, darbepoetin weekly -Monitor daily weight and volume status   Paroxysmal Atrial Fibrillation - currently NSR and normal HR. CHADS score of 1 (HTN) on daily AP therapy. Per VA records he was previously on diltiazem ER 120 mg daily and for a period of time on coumadin for [facility] therapy. -Continue 81 mg aspirin daily  -Continue PO diltiazem CD 120 mg daily  -Monitor on telemetry   Hypertension - Currently normotensive    -Continue PO diltiazem CD 120 mg daily   Chronic Liver Cirrhosis -stable.  Pt with liver function testing on admission that appears at baseline. No active bleeding. Etiology most likely due to chronic hepatitis C infection. No fever, encephalopathy, significant leukocytosis, or worsening ascites to suggest SBP or warrant paracentesis.  -Continue lactulose 10g TID  -Continue hydroxyzine 25 mg Q 6hr PRN pruritis  -Continue folic acid 1 mg daily  -Phenergan PRN nausea (avoid zofran in setting of prolonged QT)   Severe Malnutrition - Pt with chronic hypoalbuminemia in setting of ESRD and liver cirrhosis. Pt with evidence of severe muscle wasting and 8% weight loss in last month with BMI of 20.12  -Regular diet with potassium restricton -Double trays and snacks  -Nepro Shake po TID  -Monitor weight    Social Issues - Currently living at extended stay hotel (?) and unable to afford any of his medications, however reported he would receive VA check March 1st. Pt was to follow-up with [facility] in [facility] on March 6 but did not attend. Pt requesting transfer to [place].  -CM following  -Awaiting bed at [facility] in [place] or [place]   Diet: Renal  DVT Ppx: SQ heparin TID  Code: Full Dispo: Awaiting bed availability for transfer to [facility] or [place]    The patient does not have a current PCP (No Pcp Per Patient) and does need an [facility] hospital follow-up appointment after discharge.  The patient does have transportation limitations that hinder transportation to clinic appointments.  .Services Needed at time of discharge: Y = Yes, Blank = No PT:   OT:   RN:   Equipment:   Other:     LOS: 8 days   Otis Brace, MD 01/22/2014, 7:41 AM

## 2014-01-22 NOTE — Progress Notes (Signed)
Subjective:  Wants his brk now/ tolerated hd yesterday with 5 l uf not focused on ascites today / awaiting VA bed Objective Vital signs in last 24 hours: Filed Vitals:   01/21/14 2117 01/22/14 0639 01/22/14 0736 01/22/14 0841  BP: 146/89 139/89  144/90  Pulse: 91 89  103  Temp: 98.5 F (36.9 C) 98.9 F (37.2 C)  98.1 F (36.7 C)  TempSrc: Oral Oral  Oral  Resp: 20 19  18   Height:      Weight:      SpO2: 95% 96% 98% 97%   Exam:  Alert in bed NAD and calm  No jvd  Chest clear bilat  RRR no MRG Abd  ascites improved from yesterday , nontender  No leg or UE edema   Dialysis: TTS [facility]  4h New dry wt ~50kg or lower (was 55.5 kg) 2/2.25 Bath Heparin 3000 RUA AVG  Hect 2 EPO 2800 Venofer 50/wk   Assessment:  1.Suicidal ideation/PTSD/depression - transfer to [facility] when bed available  2 ESRD on hemodialysis  3 HTN/volume- vol excess improved with HD yesterday ascites better 4 Cirrhosis  5 PAF- in sinus rhythm, po dilt 120/d + asa  6 Anemia darbe 40/wk Hb 9.6  7 SHPT - P down to 5.4 from 9.8 with meds; cont hectorol  8 Nutrition - renal diet with double portions/vit   Plan-  Continue uf with tues hd if here  Lenny Pastel, PA-C [facility] Beeper [phone] 01/22/2014,9:01 AM  LOS: 8 days   Pt seen, examined, agree w assess/plan as above with additions as indicated.  [name] MD pager [phone]    cell [phone] 01/22/2014, 1:17 PM     Labs: Basic Metabolic Panel:  Recent Labs Lab 01/17/14 1433 01/19/14 0450 01/21/14 0909  NA 138 133* 133*  K 4.2 5.1 4.9  CL 93* 90* 91*  CO2 30 23 22   GLUCOSE 113* 90 123*  BUN 18 58* 61*  CREATININE 2.98* 6.00* 5.51*  CALCIUM 8.9 9.3 9.8  PHOS  --  5.4* 5.3*   Liver Function Tests:  Recent Labs Lab 01/19/14 0450 01/21/14 0909  ALBUMIN 2.6* 2.8*   No results found for this basename: LIPASE, AMYLASE,  in the last 168 hours No results found for this basename: AMMONIA,  in the last 168  hours CBC:  Recent Labs Lab 01/17/14 0650 01/19/14 0808 01/21/14 0909  WBC 7.7 7.5 9.0  HGB 9.6* 10.0* 10.1*  HCT 27.3* 28.4* 28.7*  MCV 75.6* 75.7* 75.5*  PLT 126* 131* 160   Cardiac Enzymes:  Recent Labs Lab 01/17/14 1200  TROPONINI <0.30   CBG: No results found for this basename: GLUCAP,  in the last 168 hours  Studies/Results: No results found. Medications:   . aspirin  81 mg Oral Daily  . calcium acetate  1,334 mg Oral TID WC  . diltiazem  120 mg Oral Daily  . doxercalciferol  2 mcg Intravenous Q T,Th,Sa-HD  . feeding supplement (NEPRO CARB STEADY)  237 mL Oral BID BM  . folic acid  1 mg Oral Daily  . heparin  3,000 Units Dialysis Once in dialysis  . heparin  5,000 Units Subcutaneous 3 times per day  . lactulose  10 g Oral TID  . QUEtiapine  50 mg Oral QHS  . sertraline  25 mg Oral Daily  . sodium chloride  3 mL Intravenous Q12H  . tiotropium  18 mcg Inhalation Daily  . traZODone  100 mg Oral QHS

## 2014-01-23 NOTE — Progress Notes (Signed)
  Date: 01/23/2014  Patient name: Carl Benson  Medical record number: [id number]  Date of birth: [birth year]   This patient has been seen and the plan of care was discussed with the house staff. Please see their note for complete details. I concur with their findings with the following additions/corrections: No complaints. Medically stable. Still awaiting [facility] inpatient psych transfer.  Jonah Blue, DO, FACP Faculty [facility] Internal Medicine Residency Program 01/23/2014, 4:21 PM

## 2014-01-23 NOTE — Progress Notes (Signed)
  [facility] Progress Note  Subjective:     Objective Filed Vitals:   01/22/14 1248 01/22/14 1754 01/22/14 2153 01/23/14 0518  BP: 134/94 147/87 146/86 148/98  Pulse: 104 100 91 94  Temp: 97.7 F (36.5 C) 98.3 F (36.8 C) 98.8 F (37.1 C) 97.8 F (36.6 C)  TempSrc: Oral Oral Oral Oral  Resp: 16 17 16 18   Height:      Weight:   56.4 kg (124 lb 5.4 oz)   SpO2: 98% 100% 94% 97%   Physical Exam General: Heart: Lungs: Abdomen: Extremities: Dialysis Access: right upper AVGG + bruit  Dialysis: TTS [facility]  4h New dry wt ~50kg or lower (was 55.5 kg) 2/2.25 Bath Heparin 3000 RUA AVG  Hect 2 EPO 2800 Venofer 50/wk  Assessment/Plan: .Suicidal ideation/PTSD/depression - transfer to [facility] when bed available  2 ESRD TTS - usual orders 3 HTN/volume- vol excess improved with HD yesterday ascites better; weights; no post weight Saturday - pre HD weight was 56.2 with net UF 5 L - need to get standing weights pre and post 4 Cirrhosis  5 PAF- in sinus rhythm, po dilt 120/d + asa  6 Anemia Hgb 10.1 - stable without ESA yet - recheck CBC Tuesday - dose if Hgb drops further 7 SHPT - P down to 5.3 from 9.8 with meds; cont hectorol 2 8 Nutrition - renal diet with double portions/vit - only ate about half of breakfast today  Sheffield Slider, PA-C [facility] Beeper [phone] 01/23/2014,9:39 AM  LOS: 9 days    Additional Objective Labs: Basic Metabolic Panel:  Recent Labs Lab 01/17/14 1433 01/19/14 0450 01/21/14 0909  NA 138 133* 133*  K 4.2 5.1 4.9  CL 93* 90* 91*  CO2 30 23 22   GLUCOSE 113* 90 123*  BUN 18 58* 61*  CREATININE 2.98* 6.00* 5.51*  CALCIUM 8.9 9.3 9.8  PHOS  --  5.4* 5.3*   Liver Function Tests:  Recent Labs Lab 01/19/14 0450 01/21/14 0909  ALBUMIN 2.6* 2.8*   CBC:  Recent Labs Lab 01/17/14 0650 01/19/14 0808 01/21/14 0909  WBC 7.7 7.5 9.0  HGB 9.6* 10.0* 10.1*  HCT 27.3* 28.4* 28.7*  MCV 75.6* 75.7* 75.5*  PLT  126* 131* 160  Cardiac Enzymes:  Recent Labs Lab 01/17/14 1200  TROPONINI <0.30  Medications:   . aspirin  81 mg Oral Daily  . calcium acetate  1,334 mg Oral TID WC  . diltiazem  120 mg Oral Daily  . doxercalciferol  2 mcg Intravenous Q T,Th,Sa-HD  . feeding supplement (NEPRO CARB STEADY)  237 mL Oral BID BM  . folic acid  1 mg Oral Daily  . heparin  3,000 Units Dialysis Once in dialysis  . heparin  5,000 Units Subcutaneous 3 times per day  . lactulose  10 g Oral TID  . QUEtiapine  50 mg Oral QHS  . sertraline  25 mg Oral Daily  . sodium chloride  3 mL Intravenous Q12H  . tiotropium  18 mcg Inhalation Daily  . traZODone  100 mg Oral QHS

## 2014-01-23 NOTE — Progress Notes (Signed)
I saw the patient and agree with the above assessment and plan.    Pt only complaint os symptomatic ascites and he requests paracentesis. This has been somewhat responsive to UF. TRH can assess whether needs paracenesis

## 2014-01-23 NOTE — Progress Notes (Signed)
NUTRITION FOLLOW-UP  DOCUMENTATION CODES Per approved criteria  -Severe malnutrition in the context of chronic illness   Pt meets criteria for severe MALNUTRITION in the context of chronic illness as evidenced by severe muscle wasting and 11% wt loss x <2 months.  INTERVENTION: Continue Nepro Shake po BID. Diet restrictions/portions per renal team. Agree with liberalizations. Continue to follow nutrition care plan.  NUTRITION DIAGNOSIS: Malnutrition related to chronic illness as evidenced by severe muscle wasting and 12% weight loss x 1 month.   Goal: Pt to meet >/= 90% of their estimated nutrition needs   Monitor:  PO intake, weight trend, labs, supplement acceptance  ASSESSMENT: Carl Benson is a 58 year old Veteran with past medical history of ESRD on TTS HD, COPD, HTN, decompensated liver cirrhosis, and afib (not on coumadin) who presents with depression. The patient was recently hospitalized and left AMA. Pt admitted 3/7 with desire to "be committed to the mental health ward at the [facility]." Pt admitted to being suicidal for the past 6 months and binged on crack cocaine 2 days ago.  Ordered for a Regular diet with double portions and low potassium restriction. Pt has been eating >50% of most meals. Tells the team that he has been gaining weight because he has been eating well.  Pt has been seen by psych, with recommendations for acute psychiatric hospitalization. Per case manager note on 3/13, pt unable to go to [facility] 2/2 HD status. Dispo plan remains undetermined.  Potassium WNL. Phosphorus elevated at 5.3.   Height: Ht Readings from Last 1 Encounters:  01/17/14 5\' 5"  (1.651 m)    Weight: Wt Readings from Last 1 Encounters:  01/22/14 124 lb 5.4 oz (56.4 kg)  52.4 kg s/p HD on 3/12 55.2 kg s/p HD on 3/10  BMI:  Body mass index is 20.69 kg/(m^2). Normal weight  Estimated Nutritional Needs: Kcal: 1900-2100 Protein: 75-85 grams Fluid: 1.2 L/day  Skin: no issues  noted  Diet Order: General; Double Portions; Low Potassium  EDUCATION NEEDS: -No education needs identified at this time   Intake/Output Summary (Last 24 hours) at 01/23/14 1153 Last data filed at 01/23/14 0900  Gross per 24 hour  Intake   1490 ml  Output      1 ml  Net   1489 ml    Last BM: 3/15  Labs:   Recent Labs Lab 01/17/14 1433 01/19/14 0450 01/21/14 0909  NA 138 133* 133*  K 4.2 5.1 4.9  CL 93* 90* 91*  CO2 30 23 22   BUN 18 58* 61*  CREATININE 2.98* 6.00* 5.51*  CALCIUM 8.9 9.3 9.8  PHOS  --  5.4* 5.3*  GLUCOSE 113* 90 123*    CBG (last 3)  No results found for this basename: GLUCAP,  in the last 72 hours  Scheduled Meds: . aspirin  81 mg Oral Daily  . calcium acetate  1,334 mg Oral TID WC  . diltiazem  120 mg Oral Daily  . doxercalciferol  2 mcg Intravenous Q T,Th,Sa-HD  . feeding supplement (NEPRO CARB STEADY)  237 mL Oral BID BM  . folic acid  1 mg Oral Daily  . heparin  3,000 Units Dialysis Once in dialysis  . heparin  5,000 Units Subcutaneous 3 times per day  . lactulose  10 g Oral TID  . QUEtiapine  50 mg Oral QHS  . sertraline  25 mg Oral Daily  . sodium chloride  3 mL Intravenous Q12H  . tiotropium  18 mcg  Inhalation Daily  . traZODone  100 mg Oral QHS    Continuous Infusions:   Jarold MottoSamantha Keirsten Matuska MS, RD, LDN Inpatient Registered Dietitian Pager: [phone] After-hours pager: [phone]

## 2014-01-23 NOTE — Progress Notes (Signed)
Subjective:  Pt seen and examined in AM. No acute events overnight. Reports he is gaining weight because he is eating more. He is asking when [facility].    Objective: Vital signs in last 24 hours: Filed Vitals:   01/22/14 1754 01/22/14 2153 01/23/14 0518 01/23/14 0936  BP: 147/87 146/86 148/98 130/83  Pulse: 100 91 94 87  Temp: 98.3 F (36.8 C) 98.8 F (37.1 C) 97.8 F (36.6 C) 98.1 F (36.7 C)  TempSrc: Oral Oral Oral Oral  Resp: 17 16 18 21   Height:      Weight:  56.4 kg (124 lb 5.4 oz)    SpO2: 100% 94% 97% 99%   Weight change: 0.2 kg (7.1 oz)  Intake/Output Summary (Last 24 hours) at 01/23/14 1127 Last data filed at 01/23/14 0900  Gross per 24 hour  Intake   1490 ml  Output      1 ml  Net   1489 ml   Physical Exam:  General appearance:  Malnourished Resp: Clear to ausculation in anterior fields   Cardio: normal rate and regular rhythm   GI: normal BS , soft and non-tender, mild distension  Extremities: No edema Psych: cooperative  Lab Results: Basic Metabolic Panel:  Recent Labs Lab 01/19/14 0450 01/21/14 0909  NA 133* 133*  K 5.1 4.9  CL 90* 91*  CO2 23 22  GLUCOSE 90 123*  BUN 58* 61*  CREATININE 6.00* 5.51*  CALCIUM 9.3 9.8  PHOS 5.4* 5.3*   Liver Function Tests:  Recent Labs Lab 01/19/14 0450 01/21/14 0909  ALBUMIN 2.6* 2.8*   CBC:  Recent Labs Lab 01/19/14 0808 01/21/14 0909  WBC 7.5 9.0  HGB 10.0* 10.1*  HCT 28.4* 28.7*  MCV 75.7* 75.5*  PLT 131* 160  BNP:  Studies/Results: No results found. Medications: I have reviewed the patient's current medications. Scheduled Meds: . aspirin  81 mg Oral Daily  . calcium acetate  1,334 mg Oral TID WC  . diltiazem  120 mg Oral Daily  . doxercalciferol  2 mcg Intravenous Q T,Th,Sa-HD  . feeding supplement (NEPRO CARB STEADY)  237 mL Oral BID BM  . folic acid  1 mg Oral Daily  . heparin  3,000 Units Dialysis Once in dialysis  . heparin  5,000 Units  Subcutaneous 3 times per day  . lactulose  10 g Oral TID  . QUEtiapine  50 mg Oral QHS  . sertraline  25 mg Oral Daily  . sodium chloride  3 mL Intravenous Q12H  . tiotropium  18 mcg Inhalation Daily  . traZODone  100 mg Oral QHS   Continuous Infusions:  PRN Meds:.sodium chloride, sodium chloride, sodium chloride, acetaminophen, acetaminophen, albuterol, benzonatate, calcium carbonate (dosed in mg elemental calcium), camphor-menthol, docusate sodium, guaiFENesin, hydrOXYzine, ondansetron (ZOFRAN) IV, ondansetron, sorbitol Assessment/Plan:  Assessment: [age] year old with ESRD on HD, decompensated liver cirrhosis, COPD, HTN, and AFib (not on coumadin), and PTSD with multiple ED admissions in the past 3 months who presented on 3/7 with active suicidal thoughts.   Plan:   Recurrent Severe Major Depression and PTSD withSuicidal Ideation - currently medically stable for transfer. Pt placed under IVC in ED on admission. Pt previously hospitalized from 2/27- 3/4 and started on sertraline 25 mg daily and quetiapine 50 mg daily. Pt [facility] [place] on [facility] but did not. Pt with reported binge on crack cocaine two days prior to  admission.   -Appreciate Psychiatry consult --> acute psychiatric hospitalization for crisis stabilization, safety monitoring and medication management, to see again later today -Pt placed under IVC on 3/7 due to SI (renew if due to expire)  -Continue sertraline 25 mg daily for anxiety  -Continue quetiapine 50 mg daily for agitation     -Continue trazadone 50 mg daily at bedtime for insomnia -Continue safety sitter at bedside, ambulation from time to time is ok with psychiatry  -Awaiting availability of bed (psych or medicine) from [place] in [place] or [place], currently on diversion at [facility]. Leonore is Blane Ohara [phone] ext [phone]   ESRD - Pt on TTS schedule at [facility]. Received HD on 3/7, 3/10, 3/12, 3/14   -Regular diet with  potassium restriction -Nephrology following, on TTS schedule  -Continue renavite daily and phoslo 1334 TID with meals  -Hectoral with HD, darbepoetin weekly -Monitor daily weight and volume status   Paroxysmal Atrial Fibrillation - currently NSR and normal HR. CHADS score of 1 (HTN) on daily AP therapy. Per [facility] records he was previously on diltiazem ER 120 mg daily and for a period of time on coumadin for [facility] therapy. -Continue 81 mg aspirin daily  -Continue PO diltiazem CD 120 mg daily  -Monitor on telemetry   Hypertension - Currently normotensive    -Continue PO diltiazem CD 120 mg daily   Chronic Liver Cirrhosis -stable.  Pt with liver function testing on admission that appears at baseline. No active bleeding. Etiology most likely due to chronic hepatitis C infection. No fever, encephalopathy, significant leukocytosis, or worsening ascites to suggest SBP or warrant paracentesis.  -Continue lactulose 10g TID  -Continue hydroxyzine 25 mg Q 6hr PRN pruritis  -Continue folic acid 1 mg daily  -Phenergan PRN nausea (avoid zofran in setting of prolonged QT)   Severe Malnutrition - Pt with chronic hypoalbuminemia in setting of ESRD and liver cirrhosis. Pt with evidence of severe muscle wasting and 8% weight loss in last month with BMI of 20.12  -Regular diet with potassium restricton -Double trays and snacks  -Nepro Shake po TID  -Monitor weight    Social Issues - Currently living at extended stay hotel (?) and unable to afford any of his medications, however reported he would receive VA check March 1st. Pt was to [facility] with [facility] on March 6 but did not attend. Pt requesting transfer to [place].  -CM following  -Awaiting bed at [facility] in [place] or [place]   Diet: Renal  DVT Ppx: SQ heparin TID  Code: Full Dispo: Awaiting bed availability for transfer to [facility] or [place]   The patient does not have a current PCP (No Pcp Per Patient) and does need an [facility] hospital follow-up  appointment after discharge.  The patient does have transportation limitations that hinder transportation to clinic appointments.  .Services Needed at time of discharge: Y = Yes, Blank = No PT: No  OT: No  RN: No  Equipment: None  Other: None    LOS: 9 days   Otis Brace, MD 01/23/2014, 11:27 AM

## 2014-01-24 DIAGNOSIS — F339 Major depressive disorder, recurrent, unspecified: Secondary | ICD-10-CM

## 2014-01-24 DIAGNOSIS — F1910 Other psychoactive substance abuse, uncomplicated: Secondary | ICD-10-CM

## 2014-01-24 LAB — RENAL FUNCTION PANEL
Albumin: 2.9 g/dL — ABNORMAL LOW (ref 3.5–5.2)
BUN: 88 mg/dL — ABNORMAL HIGH (ref 6–23)
CO2: 22 mEq/L (ref 19–32)
Calcium: 10.2 mg/dL (ref 8.4–10.5)
Chloride: 87 mEq/L — ABNORMAL LOW (ref 96–112)
Creatinine, Ser: 6.74 mg/dL — ABNORMAL HIGH (ref 0.50–1.35)
GFR calc Af Amer: 9 mL/min — ABNORMAL LOW (ref >90)
GFR calc non Af Amer: 8 mL/min — ABNORMAL LOW (ref >90)
Glucose, Bld: 112 mg/dL — ABNORMAL HIGH (ref 70–99)
Phosphorus: 7.6 mg/dL — ABNORMAL HIGH (ref 2.3–4.6)
Potassium: 5.9 mEq/L — ABNORMAL HIGH (ref 3.7–5.3)
Sodium: 131 mEq/L — ABNORMAL LOW (ref 137–147)

## 2014-01-24 LAB — CBC
HCT: 27.6 % — ABNORMAL LOW (ref 39.0–52.0)
Hemoglobin: 9.7 g/dL — ABNORMAL LOW (ref 13.0–17.0)
MCH: 26.4 pg (ref 26.0–34.0)
MCHC: 35.1 g/dL (ref 30.0–36.0)
MCV: 75.2 fL — ABNORMAL LOW (ref 78.0–100.0)
Platelets: 133 10*3/uL — ABNORMAL LOW (ref 150–400)
RBC: 3.67 MIL/uL — ABNORMAL LOW (ref 4.22–5.81)
RDW: 20 % — ABNORMAL HIGH (ref 11.5–15.5)
WBC: 8.1 10*3/uL (ref 4.0–10.5)

## 2014-01-24 MED ORDER — ALTEPLASE 2 MG IJ SOLR
2.0000 mg | Freq: Once | INTRAMUSCULAR | Status: AC | PRN
  Filled 2014-01-24: qty 2

## 2014-01-24 MED ORDER — HEPARIN SODIUM (PORCINE) 1000 UNIT/ML DIALYSIS
1000.0000 [IU] | INTRAMUSCULAR | Status: DC | PRN
  Filled 2014-01-24: qty 1

## 2014-01-24 MED ORDER — PENTAFLUOROPROP-TETRAFLUOROETH EX AERO
1.0000 | INHALATION_SPRAY | CUTANEOUS | Status: DC | PRN
Start: 2014-01-24 — End: 2014-01-26

## 2014-01-24 MED ORDER — NEPRO/CARBSTEADY PO LIQD: 237.0000 mL | ORAL | Status: DC | PRN

## 2014-01-24 MED ORDER — SODIUM CHLORIDE 0.9 % IV SOLN: 100.0000 mL | INTRAVENOUS | Status: DC | PRN

## 2014-01-24 MED ORDER — LIDOCAINE-PRILOCAINE 2.5-2.5 % EX CREA: 1.0000 "application " | TOPICAL_CREAM | CUTANEOUS | Status: DC | PRN

## 2014-01-24 MED ORDER — LIDOCAINE HCL (PF) 1 % IJ SOLN
5.0000 mL | INTRAMUSCULAR | Status: DC | PRN
Start: 2014-01-24 — End: 2014-01-26

## 2014-01-24 MED ORDER — HYDROXYZINE HCL 25 MG PO TABS
ORAL_TABLET | ORAL | Status: AC
  Filled 2014-01-24: qty 1

## 2014-01-24 MED ORDER — DOXERCALCIFEROL 4 MCG/2ML IV SOLN
INTRAVENOUS | Status: AC
  Filled 2014-01-24: qty 2

## 2014-01-24 MED ORDER — HEPARIN SODIUM (PORCINE) 1000 UNIT/ML DIALYSIS
20.0000 [IU]/kg | INTRAMUSCULAR | Status: DC | PRN
  Filled 2014-01-24: qty 2

## 2014-01-24 NOTE — Progress Notes (Signed)
IVC paperwork faxed to Magistrate.  Magistrate called to confirm receipt of fax.

## 2014-01-24 NOTE — Procedures (Signed)
I was present at this dialysis session. I have reviewed the session itself and made appropriate changes.   Sabra Heck  MD 01/24/2014, 9:12 AM

## 2014-01-24 NOTE — Consult Note (Signed)
Reason for Consult: depression and ptsd and suicidal thoughts Referring Physician: Dr. [name] is an 58 y.o. male.  HPI: Patient was seen for psychiatric consultation followup as requested by primary team. Patient continued to report symptoms of depression and an anxiety. Patient has been regretful about letting his friend manipulate and than relapse on his drug of abuse after ten months of being sober. Patient has been suffering with Maj. depressive disorder, recurrent and severe, posttraumatic stress disorder and substance abuse. Patient is currently on involuntary commitment and waiting for psychiatric meds at the [facility].   Patient has been compliant with his medication for both his psychiatric condition and hemodialysis for chronic renal failure. Patient continued to have suicidal thoughts, depression, anxiety with flashbacks about his past trauma. Patient regrets for his wrong decisions about drug of abuse and a previous suicide attempts reportedly trying to slash his fore arm and tried to jump in a lake. Patient denied visual hallucinations, delusions and paranoia. Patient has suicidal ideation but contracts for safety while in the hospital.   Mental Status Examination: Patient  is calm, quite and cooperative and pleasant during this evaluation.  patient has decreased psychomotor activity and has good eye contact. Patient has depressed and anxious mood and his affect was appropriate and congruent. He has normal rate, rhythm, and volume of speech. His thought process is linear and goal directed. Patient has suicidal ideations without plan, and denied homicidal ideations, intentions or plans. Patient has no evidence of auditory or visual hallucinations, delusions, and paranoia. Patient has fair insight judgment and impulse control.  Past Medical History  Diagnosis Date  . COPD (chronic obstructive pulmonary disease)   . Hypertension   . Hep C w/ coma, chronic   . Irregular  heartbeat   . ESRD (end stage renal disease) on dialysis     /notes 11/11/2013  . Smoker unmotivated to quit   . Active smoker   . PTSD (post-traumatic stress disorder)   . Shortness of breath     Past Surgical History  Procedure Laterality Date  . Paracentesis  ~ 10/2013    Archie Endo 11/11/2013    Family History  Problem Relation Age of Onset  . Heart disease Mother   . Hypertension Mother   . Diabetes Mother     Social History:  reports that he has been smoking Cigarettes.  He has a 7.5 pack-year smoking history. He has never used smokeless tobacco. He reports that he uses illicit drugs ("Crack" cocaine, Cocaine, Marijuana, and Other-see comments). He reports that he does not drink alcohol.  Allergies: No Known Allergies  Medications: I have reviewed the patient's current medications.  Results for orders placed during the hospital encounter of 01/14/14 (from the past 48 hour(s))  CBC     Status: Abnormal   Collection Time    01/24/14  7:20 AM      Result Value Ref Range   WBC 8.1  4.0 - 10.5 K/uL   RBC 3.67 (*) 4.22 - 5.81 MIL/uL   Hemoglobin 9.7 (*) 13.0 - 17.0 g/dL   HCT 27.6 (*) 39.0 - 52.0 %   MCV 75.2 (*) 78.0 - 100.0 fL   MCH 26.4  26.0 - 34.0 pg   MCHC 35.1  30.0 - 36.0 g/dL   RDW 20.0 (*) 11.5 - 15.5 %   Platelets 133 (*) 150 - 400 K/uL  RENAL FUNCTION PANEL     Status: Abnormal   Collection Time  01/24/14  7:20 AM      Result Value Ref Range   Sodium 131 (*) 137 - 147 mEq/L   Potassium 5.9 (*) 3.7 - 5.3 mEq/L   Chloride 87 (*) 96 - 112 mEq/L   CO2 22  19 - 32 mEq/L   Glucose, Bld 112 (*) 70 - 99 mg/dL   BUN 88 (*) 6 - 23 mg/dL   Creatinine, Ser 6.74 (*) 0.50 - 1.35 mg/dL   Calcium 10.2  8.4 - 10.5 mg/dL   Phosphorus 7.6 (*) 2.3 - 4.6 mg/dL   Albumin 2.9 (*) 3.5 - 5.2 g/dL   GFR calc non Af Amer 8 (*) >90 mL/min   GFR calc Af Amer 9 (*) >90 mL/min   Comment: (NOTE)     The eGFR has been calculated using the CKD EPI equation.     This calculation has  not been validated in all clinical situations.     eGFR's persistently <90 mL/min signify possible Chronic Kidney     Disease.    No results found.  Positive for anxiety, bad mood, depression, illegal drug usage, mood swings and sleep disturbance Blood pressure 132/82, pulse 89, temperature 98.3 F (36.8 C), temperature source Oral, resp. rate 20, height 5' 5" (1.651 m), weight 49.8 kg (109 lb 12.6 oz), SpO2 98.00%.   Assessment/Plan: Maj. depressive disorder, recurrent Posttraumatic stress disorder Cocaine abuse versus dependence  Recommendation:  1. Involuntary commitment petitioned paper work was updated as requested by psychiatric social service  2. Recommended acute psychiatric hospitalization for crisis stabilization, safety monitoring and medication management  3. Patient will be referred to be [facility] [place] as patient requested because he has 100% disability benefits  4. Continue Zoloft 25 mg daily for depression and anxiety, Seroquel 50 mg at bedtime for agitation and trazodone 100 mg at bedtime for insomnia   5. Continue safety sitter and may take a walk from time to time if his medical condition permits  6. Appreciate psychiatric consultation and followup as clinically required  ,JANARDHAHA R. 01/24/2014, 3:57 PM      

## 2014-01-24 NOTE — Progress Notes (Addendum)
Subjective:  Pt seen and examined in AM. No acute events overnight. No complaints of abdominal distension.  He states it is difficult waiting for a bed to be available at the [place].    Objective: Vital signs in last 24 hours: Filed Vitals:   01/24/14 0950 01/24/14 1000 01/24/14 1028 01/24/14 1056  BP: 133/80 135/78 131/79 127/70  Pulse: 83 83 84 83  Temp:    98 F (36.7 C)  TempSrc:    Oral  Resp: 25 23 21 20   Height:      Weight:    49.8 kg (109 lb 12.6 oz)  SpO2:    98%   Weight change: -1.9 kg (-4 lb 3 oz)  Intake/Output Summary (Last 24 hours) at 01/24/14 1115 Last data filed at 01/24/14 1056  Gross per 24 hour  Intake    600 ml  Output   3975 ml  Net  -3375 ml   Physical Exam:  General appearance:  Malnourished Resp: Clear to ausculation in anterior fields   Cardio: Normal rate and regular rhythm   GI: Normal BS , soft and non-tender, mild distension  Extremities: no edema Psych: cooperative  Lab Results: Basic Metabolic Panel:  Recent Labs Lab 01/21/14 0909 01/24/14 0720  NA 133* 131*  K 4.9 5.9*  CL 91* 87*  CO2 22 22  GLUCOSE 123* 112*  BUN 61* 88*  CREATININE 5.51* 6.74*  CALCIUM 9.8 10.2  PHOS 5.3* 7.6*   Liver Function Tests:  Recent Labs Lab 01/21/14 0909 01/24/14 0720  ALBUMIN 2.8* 2.9*   CBC:  Recent Labs Lab 01/21/14 0909 01/24/14 0720  WBC 9.0 8.1  HGB 10.1* 9.7*  HCT 28.7* 27.6*  MCV 75.5* 75.2*  PLT 160 133*  BNP:  Studies/Results: No results found. Medications: I have reviewed the patient's current medications. Scheduled Meds: . aspirin  81 mg Oral Daily  . calcium acetate  1,334 mg Oral TID WC  . diltiazem  120 mg Oral Daily  . doxercalciferol  2 mcg Intravenous Q T,Th,Sa-HD  . feeding supplement (NEPRO CARB STEADY)  237 mL Oral BID BM  . folic acid  1 mg Oral Daily  . heparin  3,000 Units Dialysis Once in dialysis  . heparin  5,000 Units Subcutaneous 3 times per day  . hydrOXYzine      . lactulose  10 g  Oral TID  . QUEtiapine  50 mg Oral QHS  . sertraline  25 mg Oral Daily  . sodium chloride  3 mL Intravenous Q12H  . tiotropium  18 mcg Inhalation Daily  . traZODone  100 mg Oral QHS   Continuous Infusions:  PRN Meds:.sodium chloride, sodium chloride, sodium chloride, acetaminophen, acetaminophen, albuterol, benzonatate, calcium carbonate (dosed in mg elemental calcium), camphor-menthol, docusate sodium, guaiFENesin, hydrOXYzine, ondansetron (ZOFRAN) IV, ondansetron, sorbitol Assessment/Plan:  Assessment: 58 year old with ESRD on HD, decompensated liver cirrhosis, COPD, HTN, and AFib (not on coumadin), and PTSD with multiple ED admissions in the past 3 months who presented on 3/7 with active suicidal thoughts.   Plan:   Recurrent Severe Major Depression and PTSD withSuicidal Ideation - currently medically stable for transfer. Pt placed under IVC in ED on admission. Pt previously hospitalized from 2/27- 3/4 and started on sertraline 25 mg daily and quetiapine 50 mg daily. Pt was to follow up with outpatient psychiatry in [facility] on 3/6 but did not. Pt with reported binge on crack cocaine two days prior to admission.   -Appreciate Psychiatry  consult --> acute psychiatric hospitalization for crisis stabilization, safety monitoring and medication management, to see again later today -Pt placed under IVC on 3/7 due to SI (renew today)  -Continue sertraline 25 mg daily for anxiety  -Continue quetiapine 50 mg daily for agitation     -Continue trazadone 50 mg daily at bedtime for insomnia -Continue safety sitter at bedside, ambulation from time to time is ok with psychiatry  -Awaiting availability of [place] ([place] [facility] [place], currently [facility] diversion [facility]. [place] is Blane OharaLaurie Vasey [phone] ext [phone]   ESRD - [facility]. Received HD on 3/7, 3/10, 3/12, 3/14, 3/16   -Regular diet with potassium restriction -Nephrology following, on TTS  schedule  -Continue renavite daily and phoslo 1334 TID with meals  -Hectoral with HD, darbepoetin weekly -Monitor daily weight and volume status   Paroxysmal Atrial Fibrillation - currently NSR and normal HR. CHADS score of 1 (HTN) on daily AP therapy. Per [facility] records he was previously on diltiazem ER 120 mg daily and for a period of time on coumadin for [facility] therapy. -Continue 81 mg aspirin daily  -Continue PO diltiazem CD 120 mg daily  -Monitor on telemetry   Hypertension - Currently normotensive    -Continue PO diltiazem CD 120 mg daily   Chronic Liver Cirrhosis -stable.  Pt with liver function testing on admission that appears at baseline. No active bleeding. Etiology most likely due to chronic hepatitis C infection. No fever, encephalopathy, significant leukocytosis, or worsening ascites to suggest SBP or warrant paracentesis.  -Continue lactulose 10g TID  -Continue hydroxyzine 25 mg Q 6hr PRN pruritis  -Continue folic acid 1 mg daily  -Phenergan PRN nausea (avoid zofran in setting of prolonged QT)   Severe Malnutrition - Pt with chronic hypoalbuminemia in setting of ESRD and liver cirrhosis. Pt with evidence of severe muscle wasting and 8% weight loss in last month with BMI of 20.12  -Regular diet with potassium restricton -Double trays and snacks  -Nepro Shake po TID  -Monitor weight    Social Issues - Currently living at extended stay hotel (?) and unable to afford any of his medications, however reported he would receive VA check March 1st. [facility] but did not attend. [place] [facility] [place] [facility].  -CM following  -[facility] [place] or [place]   Diet: Renal  DVT Ppx: SQ heparin TID  Code: Full Dispo: Awaiting bed availability for [facility] to [facility] [place]   The patient does not have a current PCP (No Pcp Per Patient) and [facility] after discharge.  The patient does  have transportation limitations that hinder transportation to clinic appointments.  .Services Needed at time of discharge: Y = Yes, Blank = No PT: No  OT: No  RN: No  Equipment: None  Other: None    LOS: 10 days   Otis BraceMarjan Dorrell Mitcheltree, MD 01/24/2014, 11:15 AM

## 2014-01-24 NOTE — Clinical Social Work Psych Note (Addendum)
1:08pm- CSW updated MD regarding disposition.  IVC paperwork will need to be updated.  Expiration date: today.  CSW will prepare.  MD Jonnalagadda to sign.  12:56pm- Per Medical Director, Psych CSW gave verbal referral to Charge RN at [facility] regarding possible admission.  Vickii Penna, LCSWA [phone]  Clinical Social Work

## 2014-01-24 NOTE — Progress Notes (Signed)
  Date: 01/24/2014  Patient name: Carl Benson  Medical record number: [id number]  Date of birth: [birth year]   This patient has been seen and the plan of care was discussed with the house staff. Please see their note for complete details. I concur with their findings with the following additions/corrections: Seen in HD today. Feels well. No complaints. Still awaiting transfer to [facility] facility and continued IVC.  Jonah Blue, DO, FACP Faculty [facility] Internal Medicine Residency Program 01/24/2014, 11:41 AM

## 2014-01-25 MED ORDER — LANTHANUM CARBONATE 500 MG PO CHEW
1000.0000 mg | CHEWABLE_TABLET | Freq: Three times a day (TID) | ORAL | Status: DC
  Administered 2014-01-25 – 2014-02-01 (×16): 1000 mg via ORAL
  Filled 2014-01-25 (×25): qty 2

## 2014-01-25 NOTE — Progress Notes (Signed)
  Date: 01/25/2014  Patient name: Carl Benson  Medical record number: [id number]  Date of birth: 1956/10/09   This patient has been seen and the plan of care was discussed with the house staff. Please see their note for complete details. I concur with their findings with the following additions/corrections: He is happy with his meals. No complaints for me today. Still pending [facility] a [facility] facility.  Jonah Blue, DO, FACP Faculty [facility] Internal Medicine Residency Program 01/25/2014, 12:08 PM

## 2014-01-25 NOTE — Progress Notes (Signed)
Subjective:  Pt seen and examined in AM. No acute events overnight. States he wants to eat french fries but was not allowed to do so yesterday. He has no complaints and states he is not feeling good today. He is getting tired of waiting for placement.    Objective: Vital signs in last 24 hours: Filed Vitals:   01/24/14 2106 01/25/14 0640 01/25/14 0700 01/25/14 0824  BP: 129/79 132/86  138/66  Pulse: 93 91  88  Temp: 98 F (36.7 C) 98.3 F (36.8 C)  97.7 F (36.5 C)  TempSrc: Oral Oral  Oral  Resp: 20 18  16   Height: 5\' 5"  (1.651 m)     Weight: 52.436 kg (115 lb 9.6 oz)     SpO2: 96% 96% 96% 97%   Weight change: -4.7 kg (-10 lb 5.8 oz)  Intake/Output Summary (Last 24 hours) at 01/25/14 [id number] Last data filed at 01/25/14 0900  Gross per 24 hour  Intake    960 ml  Output   3974 ml  Net  -3014 ml   Physical Exam:  General appearance:  Malnourished Resp: Clear to ausculation bilaterally with no wheezing, rhonchi, or rales Cardio: Normal rate and regular rhythm   GI: Normal BS , soft and non-tender, mild distension  Extremities: no edema Psych: cooperative  Lab Results: Basic Metabolic Panel:  Recent Labs Lab 01/21/14 0909 01/24/14 0720  NA 133* 131*  K 4.9 5.9*  CL 91* 87*  CO2 22 22  GLUCOSE 123* 112*  BUN 61* 88*  CREATININE 5.51* 6.74*  CALCIUM 9.8 10.2  PHOS 5.3* 7.6*   Liver Function Tests:  Recent Labs Lab 01/21/14 0909 01/24/14 0720  ALBUMIN 2.8* 2.9*   CBC:  Recent Labs Lab 01/21/14 0909 01/24/14 0720  WBC 9.0 8.1  HGB 10.1* 9.7*  HCT 28.7* 27.6*  MCV 75.5* 75.2*  PLT 160 133*  BNP:  Studies/Results: No results found. Medications: I have reviewed the patient's current medications. Scheduled Meds: . aspirin  81 mg Oral Daily  . calcium acetate  1,334 mg Oral TID WC  . diltiazem  120 mg Oral Daily  . doxercalciferol  2 mcg Intravenous Q T,Th,Sa-HD  . feeding supplement (NEPRO CARB STEADY)  237 mL Oral BID BM  . folic acid  1  mg Oral Daily  . heparin  3,000 Units Dialysis Once in dialysis  . heparin  5,000 Units Subcutaneous 3 times per day  . lactulose  10 g Oral TID  . QUEtiapine  50 mg Oral QHS  . sertraline  25 mg Oral Daily  . sodium chloride  3 mL Intravenous Q12H  . tiotropium  18 mcg Inhalation Daily  . traZODone  100 mg Oral QHS   Continuous Infusions:  PRN Meds:.sodium chloride, sodium chloride, sodium chloride, sodium chloride, sodium chloride, acetaminophen, acetaminophen, albuterol, benzonatate, calcium carbonate (dosed in mg elemental calcium), camphor-menthol, docusate sodium, feeding supplement (NEPRO CARB STEADY), guaiFENesin, heparin, heparin, hydrOXYzine, lidocaine (PF), lidocaine-prilocaine, ondansetron (ZOFRAN) IV, ondansetron, pentafluoroprop-tetrafluoroeth, sorbitol Assessment/Plan:  Assessment: 58 year old with ESRD on HD, decompensated liver cirrhosis, COPD, HTN, and AFib (not on coumadin), and PTSD with multiple ED admissions in the past 3 months who presented on 3/7 with active suicidal thoughts.   Plan:   Recurrent Severe Major Depression and PTSD with Suicidal Ideation - currently medically stable for transfer. Pt placed under IVC in ED on admission. Pt previously hospitalized from 2/27- 3/4 and started on sertraline 25 mg daily and quetiapine 50 mg  daily. Pt was to follow up with outpatient psychiatry in [facility] on 3/6 but did not. Pt with reported binge on crack cocaine two days prior to admission.   -Appreciate Psychiatry consult --> acute psychiatric hospitalization for crisis stabilization, safety monitoring and medication management, to see again later today -Pt placed under IVC on 3/7, renewed on 3/17  -Continue sertraline 25 mg daily for depression and anxiety  -Continue quetiapine 50 mg daily for agitation     -Continue trazadone 50 mg daily at bedtime for insomnia -Continue safety sitter at bedside, ambulation from time to time is ok with psychiatry if medical  condition permits  -Awaiting availability of bed (psych or medicine) from [place] in [place] or [place], currently on diversion at [facility]. Now also awaiting [facility] placement.  Contact for [facility] in [place] is Blane Ohara [phone] ext [phone]   ESRD - Pt on TTS schedule at [facility]. Received HD on 3/7, 3/10, 3/12, 3/14, 3/16   -Regular diet with potassium restriction -Nephrology following, on TTS schedule  -Continue renavite daily and phoslo 1334 TID with meals  -Hectoral with HD, darbepoetin weekly -Monitor daily weight and volume status   Paroxysmal Atrial Fibrillation - currently NSR and normal HR. CHADS score of 1 (HTN) on daily AP therapy. Per VA records he was previously on diltiazem ER 120 mg daily and for a period of time on coumadin for [facility] therapy. -Continue 81 mg aspirin daily  -Continue PO diltiazem CD 120 mg daily  -Monitor on telemetry   Hypertension - Currently normotensive    -Continue PO diltiazem CD 120 mg daily   Chronic Liver Cirrhosis - stable.  Pt with liver function testing on admission that appears at baseline. No active bleeding. Etiology most likely due to chronic hepatitis C infection.  -Continue lactulose 10g TID  -Continue hydroxyzine 25 mg Q 6hr PRN pruritis  -Continue folic acid 1 mg daily  -Phenergan PRN nausea (avoid zofran in setting of prolonged QT)   Severe Malnutrition - Pt with chronic hypoalbuminemia in setting of ESRD and liver cirrhosis. Pt with evidence of severe muscle wasting and 8% weight loss in last month with BMI of 20.12  -Regular diet with potassium restricton -Double trays and snacks  -Nepro Shake po TID  -Monitor weight    Social Issues - Currently living at extended stay hotel (?) and unable to afford any of his medications, however reported he would receive VA check March 1st. Pt was to follow-up with [facility] in [facility] on March 6 but did not attend. Pt requesting transfer to [place].  -CM following  -Awaiting bed at [facility] in [place] or  [place], or [facility]  Diet: Renal  DVT Ppx: SQ heparin TID  Code: Full Dispo: Awaiting bed availability for transfer to [facility] or [place]   The patient does not have a current PCP (No Pcp Per Patient) and does need an [facility] hospital follow-up appointment after discharge.  The patient does have transportation limitations that hinder transportation to clinic appointments.  .Services Needed at time of discharge: Y = Yes, Blank = No PT: No  OT: No  RN: No  Equipment: None  Other: None    LOS: 11 days   Otis Brace, MD 01/25/2014, 9:28 AM

## 2014-01-25 NOTE — Progress Notes (Signed)
Subjective:  Co cramps on hd yesterday/ no cos today  Objective Vital signs in last 24 hours: Filed Vitals:   01/24/14 2106 01/25/14 0640 01/25/14 0700 01/25/14 0824  BP: 129/79 132/86  138/66  Pulse: 93 91  88  Temp: 98 F (36.7 C) 98.3 F (36.8 C)  97.7 F (36.5 C)  TempSrc: Oral Oral  Oral  Resp: 20 18  16   Height: 5\' 5"  (1.651 m)     Weight: 52.436 kg (115 lb 9.6 oz)     SpO2: 96% 96% 96% 97%  Physical Exam  General: alert nad /calm sitting in bedside chair looking outside Heart: RRR Lungs: cta bilat Abdomen: ascites , soft NT Extremities:no pedal edema  Dialysis Access: right upper AVGG + bruit   Dialysis: TTS [facility]  4h New dry wt  lower ?? (was 55.5 kg) 2/2.25 [place] Heparin 3000 RUA AVG  Hect 2 EPO 2800 Venofer 50/wk   Assessment/Plan:  1..Suicidal ideation/PTSD/depression - transfer to [facility] when bed available  2 ESRD TTS - usual orders  3 HTN/volume- bp stable andvol excess with his  ascites / now gaining some body wt back eating double portion  regular diet/  ~ 4 l uf yesterday and BAD cramps/ only attempt  1 liter in am 4 Cirrhosis  5 PAF- in sinus rhythm, po dilt 120/d + asa  6 Anemia Hgb 10.1> 9.7 -was stable without ESA yet - restart esa  With  next hd/ weekly venofer 7 SHPT - P 7.6< 5.3 < 9.8 with phoslo 2ac and ca corrected 11.0 / hold  hectorol 2 / change phoslo to fosrenol ^ ca 8 Nutrition - renal diet with double portions/vit - only ate about half of breakfast today  Labs: Basic Metabolic Panel:  Recent Labs Lab 01/19/14 0450 01/21/14 0909 01/24/14 0720  NA 133* 133* 131*  K 5.1 4.9 5.9*  CL 90* 91* 87*  CO2 23 22 22   GLUCOSE 90 123* 112*  BUN 58* 61* 88*  CREATININE 6.00* 5.51* 6.74*  CALCIUM 9.3 9.8 10.2  PHOS 5.4* 5.3* 7.6*   Liver Function Tests:  Recent Labs Lab 01/19/14 0450 01/21/14 0909 01/24/14 0720  ALBUMIN 2.6* 2.8* 2.9*   CBC:  Recent Labs Lab 01/19/14 0808 01/21/14 0909 01/24/14 0720  WBC 7.5 9.0 8.1  HGB 10.0*  10.1* 9.7*  HCT 28.4* 28.7* 27.6*  MCV 75.7* 75.5* 75.2*  PLT 131* 160 133*  Medications:   . aspirin  81 mg Oral Daily  . calcium acetate  1,334 mg Oral TID WC  . diltiazem  120 mg Oral Daily  . doxercalciferol  2 mcg Intravenous Q T,Th,Sa-HD  . feeding supplement (NEPRO CARB STEADY)  237 mL Oral BID BM  . folic acid  1 mg Oral Daily  . heparin  3,000 Units Dialysis Once in dialysis  . heparin  5,000 Units Subcutaneous 3 times per day  . lactulose  10 g Oral TID  . QUEtiapine  50 mg Oral QHS  . sertraline  25 mg Oral Daily  . sodium chloride  3 mL Intravenous Q12H  . tiotropium  18 mcg Inhalation Daily  . traZODone  100 mg Oral QHS    Lenny Pastel, PA-C [facility] [phone] 01/25/2014,11:02 AM  LOS: 11 days

## 2014-01-25 NOTE — Progress Notes (Signed)
I saw the patient and agree with the above assessment and plan.    

## 2014-01-26 LAB — IRON AND TIBC
Iron: 121 ug/dL (ref 42–135)
Saturation Ratios: 36 % (ref 20–55)
TIBC: 338 ug/dL (ref 215–435)
UIBC: 217 ug/dL (ref 125–400)

## 2014-01-26 LAB — FERRITIN: Ferritin: 1021 ng/mL — ABNORMAL HIGH (ref 22–322)

## 2014-01-26 MED ORDER — DOXERCALCIFEROL 4 MCG/2ML IV SOLN
INTRAVENOUS | Status: AC
  Filled 2014-01-26: qty 2

## 2014-01-26 NOTE — Progress Notes (Signed)
Subjective:  Pt seen and examined in AM. No acute events overnight. He is going to go for HD later this afternoon. [place] [facility] [place]   Objective: Vital signs in last 24 hours: Filed Vitals:   01/25/14 1219 01/25/14 1801 01/25/14 2236 01/26/14 0406  BP: 144/87 132/89 159/97 137/90  Pulse: 94 88 101 89  Temp: 97.5 F (36.4 C) 98 F (36.7 C) 98.6 F (37 C) 98.2 F (36.8 C)  TempSrc: Oral Oral Oral Oral  Resp: 18 17 18 16   Height:      Weight:      SpO2: 99% 98% 96% 96%   Weight change:   Intake/Output Summary (Last 24 hours) at 01/26/14 0841 Last data filed at 01/26/14 0650  Gross per 24 hour  Intake   1260 ml  Output      2 ml  Net   1258 ml   Physical Exam:  General appearance:  Malnourished Resp: Clear to ausculation bilaterally with no wheezing, rhonchi, or rales Cardio: Normal rate and regular rhythm   GI: Normal BS , soft and non-tender, mild distension  Extremities: no edema Psych: cooperative  Lab Results: Basic Metabolic Panel:  Recent Labs Lab 01/21/14 0909 01/24/14 0720  NA 133* 131*  K 4.9 5.9*  CL 91* 87*  CO2 22 22  GLUCOSE 123* 112*  BUN 61* 88*  CREATININE 5.51* 6.74*  CALCIUM 9.8 10.2  PHOS 5.3* 7.6*   Liver Function Tests:  Recent Labs Lab 01/21/14 0909 01/24/14 0720  ALBUMIN 2.8* 2.9*   CBC:  Recent Labs Lab 01/21/14 0909 01/24/14 0720  WBC 9.0 8.1  HGB 10.1* 9.7*  HCT 28.7* 27.6*  MCV 75.5* 75.2*  PLT 160 133*  BNP:  Studies/Results: No results found. Medications: I have reviewed the patient's current medications. Scheduled Meds: . aspirin  81 mg Oral Daily  . diltiazem  120 mg Oral Daily  . doxercalciferol  2 mcg Intravenous Q T,Th,Sa-HD  . feeding supplement (NEPRO CARB STEADY)  237 mL Oral BID BM  . folic acid  1 mg Oral Daily  . heparin  3,000 Units Dialysis Once in dialysis  . heparin  5,000 Units Subcutaneous 3 times per day  . lactulose  10 g Oral TID  . lanthanum   1,000 mg Oral TID WC  . QUEtiapine  50 mg Oral QHS  . sertraline  25 mg Oral Daily  . sodium chloride  3 mL Intravenous Q12H  . tiotropium  18 mcg Inhalation Daily  . traZODone  100 mg Oral QHS   Continuous Infusions:  PRN Meds:.sodium chloride, sodium chloride, sodium chloride, sodium chloride, sodium chloride, acetaminophen, acetaminophen, albuterol, benzonatate, calcium carbonate (dosed in mg elemental calcium), camphor-menthol, docusate sodium, feeding supplement (NEPRO CARB STEADY), guaiFENesin, heparin, heparin, hydrOXYzine, lidocaine (PF), lidocaine-prilocaine, ondansetron (ZOFRAN) IV, ondansetron, pentafluoroprop-tetrafluoroeth, sorbitol Assessment/Plan:  Assessment: 58 year old with ESRD on HD, decompensated liver cirrhosis, COPD, HTN, and AFib (not on coumadin), and PTSD with multiple ED admissions in the past 3 months who presented on 3/7 with active suicidal thoughts.   Plan:   Recurrent Severe Major Depression and PTSD with Suicidal Ideation - currently medically stable for transfer. Pt placed under IVC in ED on admission. Pt previously hospitalized from 2/27- 3/4 and started on sertraline 25 mg daily and quetiapine 50 mg daily. [facility] [place] [facility] but did not. Pt with reported  binge on crack cocaine two days prior to admission.   -Appreciate Psychiatry consult --> acute psychiatric hospitalization for crisis stabilization, safety monitoring and medication management, to see again later today -Pt placed under IVC on 3/7, renewed on 3/17  -Continue sertraline 25 mg daily for depression and anxiety  -Continue quetiapine 50 mg daily for agitation     -Continue trazadone 50 mg daily at bedtime for insomnia -Continue safety sitter at bedside, ambulation from time to time is ok with psychiatry if medical condition permits  -Awaiting availability of bed (psych or medicine) from [place] in [place] or [place], currently on diversion at  [facility]. Now also awaiting [facility] placement.  Contact for [facility] in [place] is Blane Ohara [phone] ext [phone]   ESRD - Pt on TTS schedule at [facility]. Received HD on 3/7, 3/10, 3/12, 3/14, 3/16   -Regular diet with potassium restriction -Nephrology following, on TTS schedule  -Continue renavite daily and phoslo 1334 TID with meals  -Hectoral with HD, darbepoetin weekly -Monitor daily weight and volume status   Paroxysmal Atrial Fibrillation - currently NSR and normal HR. CHADS score of 1 (HTN) on daily AP therapy. Per VA records he was previously on diltiazem ER 120 mg daily and for a period of time on coumadin for [facility] therapy. -Continue 81 mg aspirin daily  -Continue PO diltiazem CD 120 mg daily  -Monitor on telemetry   Hypertension - Currently normotensive    -Continue PO diltiazem CD 120 mg daily   Chronic Liver Cirrhosis - stable.  Pt with liver function testing on admission that appears at baseline. No active bleeding. Etiology most likely due to chronic hepatitis C infection.  -Continue lactulose 10g TID  -Continue hydroxyzine 25 mg Q 6hr PRN pruritis  -Continue folic acid 1 mg daily  -Phenergan PRN nausea (avoid zofran in setting of prolonged QT)   Severe Malnutrition - Pt with chronic hypoalbuminemia in setting of ESRD and liver cirrhosis. Pt with evidence of severe muscle wasting and 8% weight loss in last month with BMI of 20.12  -Regular diet with potassium restricton -Double trays and snacks  -Nepro Shake po TID  -Monitor weight    Social Issues - Currently living at extended stay hotel (?) and unable to afford any of his medications, however reported he would receive VA check March 1st. Pt was to follow-up with [facility] in [facility] on March 6 but did not attend. Pt requesting transfer to [place].  -CM following  -Awaiting bed at [facility] in [place] or [place], or [facility]  Diet: Renal  DVT Ppx: SQ heparin TID  Code: Full Dispo: Awaiting bed availability for transfer  to [facility] or [place]   The patient does not have a current PCP (No Pcp Per Patient) and does need an [facility] hospital follow-up appointment after discharge.  The patient does have transportation limitations that hinder transportation to clinic appointments.  .Services Needed at time of discharge: Y = Yes, Blank = No PT: No  OT: No  RN: No  Equipment: None  Other: None    LOS: 12 days   Otis Brace, MD 01/26/2014, 8:41 AM

## 2014-01-26 NOTE — Clinical Social Work Psych Note (Signed)
Referral was re-sent to [facility].  Vickii Penna, LCSWA [phone]  Clinical Social Work

## 2014-01-26 NOTE — Progress Notes (Signed)
Assessment/Plan:  1..Suicidal ideation/PTSD/depression - transfer to [facility] when bed available  2 ESRD TTS - pt signed off machine early today 3 HTN/volume- bp stable 4 Cirrhosis  5 Hyperkalemia Plan: Changed back to renal diet and I have encouraged him to comply with his treatments  Subjective: Interval History: c/o about food but potasium increased  Objective: Vital signs in last 24 hours: Temp:  [98 F (36.7 C)-98.6 F (37 C)] 98.2 F (36.8 C) (03/19 1415) Pulse Rate:  [80-101] 88 (03/19 1415) Resp:  [16-22] 18 (03/19 1415) BP: (132-159)/(76-97) 134/85 mmHg (03/19 1415) SpO2:  [95 %-98 %] 98 % (03/19 1415) Weight:  [53.3 kg (117 lb 8.1 oz)] 53.3 kg (117 lb 8.1 oz) (03/19 1325) Weight change:   Intake/Output from previous day: 03/18 0701 - 03/19 0700 In: 1500 [P.O.:1500] Out: 2 [Stool:2] Intake/Output this shift: Total I/O In: 240 [P.O.:240] Out: 425 [Other:425]  General appearance: alert, cooperative and agitated Chest wall: no tenderness Cardio: regular rate and rhythm, S1, S2 normal, no murmur, click, rub or gallop Extremities: extremities normal, atraumatic, no cyanosis or edema  Lab Results:  Recent Labs  01/24/14 0720  WBC 8.1  HGB 9.7*  HCT 27.6*  PLT 133*   BMET:  Recent Labs  01/24/14 0720  NA 131*  K 5.9*  CL 87*  CO2 22  GLUCOSE 112*  BUN 88*  CREATININE 6.74*  CALCIUM 10.2   No results found for this basename: PTH,  in the last 72 hours Iron Studies: No results found for this basename: IRON, TIBC, TRANSFERRIN, FERRITIN,  in the last 72 hours Studies/Results: No results found.  Scheduled: . aspirin  81 mg Oral Daily  . diltiazem  120 mg Oral Daily  . doxercalciferol  2 mcg Intravenous Q T,Th,Sa-HD  . feeding supplement (NEPRO CARB STEADY)  237 mL Oral BID BM  . folic acid  1 mg Oral Daily  . heparin  3,000 Units Dialysis Once in dialysis  . heparin  5,000 Units Subcutaneous 3 times per day  . lactulose  10 g Oral TID  . lanthanum   1,000 mg Oral TID WC  . QUEtiapine  50 mg Oral QHS  . sertraline  25 mg Oral Daily  . sodium chloride  3 mL Intravenous Q12H  . tiotropium  18 mcg Inhalation Daily  . traZODone  100 mg Oral QHS     LOS: 12 days   Terrah Decoster C 01/26/2014,3:33 PM

## 2014-01-26 NOTE — Progress Notes (Signed)
  Date: 01/26/2014  Patient name: Carl Benson  Medical record number: [id number]  Date of birth: 1956/05/05   This patient has been seen and the plan of care was discussed with the house staff. Please see their note for complete details. I concur with their findings with the following additions/corrections: No complaints. Still awaiting transfer to acute psychiatric facility. He has no active acute medical issues.  Jonah Blue, DO, FACP Faculty [facility] Residency Program 01/26/2014, 2:38 PM

## 2014-01-27 LAB — RENAL FUNCTION PANEL
Albumin: 2.8 g/dL — ABNORMAL LOW (ref 3.5–5.2)
BUN: 45 mg/dL — ABNORMAL HIGH (ref 6–23)
CO2: 25 meq/L (ref 19–32)
CREATININE: 4.17 mg/dL — AB (ref 0.50–1.35)
Calcium: 9.4 mg/dL (ref 8.4–10.5)
Chloride: 92 mEq/L — ABNORMAL LOW (ref 96–112)
GFR calc non Af Amer: 14 mL/min — ABNORMAL LOW (ref >90)
GFR, EST AFRICAN AMERICAN: 17 mL/min — AB (ref >90)
GLUCOSE: 92 mg/dL (ref 70–99)
Phosphorus: 6.7 mg/dL — ABNORMAL HIGH (ref 2.3–4.6)
Potassium: 5.8 mEq/L — ABNORMAL HIGH (ref 3.7–5.3)
SODIUM: 134 meq/L — AB (ref 137–147)

## 2014-01-27 LAB — PARATHYROID HORMONE, INTACT (NO CA): PTH: 62.2 pg/mL (ref 14.0–72.0)

## 2014-01-27 MED ORDER — SERTRALINE HCL 25 MG PO TABS: 25.0000 mg | ORAL_TABLET | Freq: Every day | ORAL | Status: DC

## 2014-01-27 MED ORDER — ASPIRIN 81 MG PO CHEW: 81.0000 mg | CHEWABLE_TABLET | Freq: Every day | ORAL | Status: DC

## 2014-01-27 MED ORDER — FOLIC ACID 1 MG PO TABS: 1.0000 mg | ORAL_TABLET | Freq: Every day | ORAL | Status: DC

## 2014-01-27 MED ORDER — TIOTROPIUM BROMIDE MONOHYDRATE 18 MCG IN CAPS: 18.0000 ug | ORAL_CAPSULE | Freq: Every day | RESPIRATORY_TRACT | Status: DC

## 2014-01-27 MED ORDER — DOCUSATE SODIUM 283 MG RE ENEM: 1.0000 | ENEMA | RECTAL | Status: DC | PRN

## 2014-01-27 MED ORDER — DOXERCALCIFEROL 4 MCG/2ML IV SOLN: 2.0000 ug | INTRAVENOUS | Status: DC

## 2014-01-27 MED ORDER — HYDROXYZINE HCL 25 MG PO TABS: 25.0000 mg | ORAL_TABLET | Freq: Three times a day (TID) | ORAL | Status: DC | PRN

## 2014-01-27 MED ORDER — GUAIFENESIN ER 600 MG PO TB12: 600.0000 mg | ORAL_TABLET | Freq: Two times a day (BID) | ORAL | Status: DC | PRN

## 2014-01-27 MED ORDER — TRAZODONE HCL 100 MG PO TABS: 100.0000 mg | ORAL_TABLET | Freq: Every day | ORAL | Status: DC

## 2014-01-27 MED ORDER — LANTHANUM CARBONATE 1000 MG PO CHEW: 1000.0000 mg | CHEWABLE_TABLET | Freq: Three times a day (TID) | ORAL | Status: DC

## 2014-01-27 MED ORDER — DILTIAZEM HCL ER COATED BEADS 120 MG PO CP24: 120.0000 mg | ORAL_CAPSULE | Freq: Every day | ORAL | Status: DC

## 2014-01-27 MED ORDER — LACTULOSE 10 GM/15ML PO SOLN: 10.0000 g | Freq: Three times a day (TID) | ORAL | Status: DC

## 2014-01-27 MED ORDER — NEPRO/CARBSTEADY PO LIQD: 237.0000 mL | Freq: Two times a day (BID) | ORAL | Status: DC

## 2014-01-27 MED ORDER — BENZONATATE 100 MG PO CAPS: 100.0000 mg | ORAL_CAPSULE | Freq: Two times a day (BID) | ORAL | Status: DC | PRN

## 2014-01-27 MED ORDER — CAMPHOR-MENTHOL 0.5-0.5 % EX LOTN: 1.0000 "application " | TOPICAL_LOTION | Freq: Three times a day (TID) | CUTANEOUS | Status: DC | PRN

## 2014-01-27 MED ORDER — QUETIAPINE FUMARATE 50 MG PO TABS: 50.0000 mg | ORAL_TABLET | Freq: Every day | ORAL | Status: DC

## 2014-01-27 NOTE — Discharge Summary (Addendum)
Name: Carl Benson MRN: [id number] DOB: [birth year] 58 y.o. 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  Hyperkalemia  Hyperphosphatemia in setting of Secondary Hyperparathyroidism ESRD Paroxysmal Atrial Fibrillation Hypertension  Chronic Liver Cirrhosis COPD  Diastolic Congestive Heart Failure Chronic Microcytic Anemia  Chronic Thrombocytopenia  Chronic Anion Gap Metabolic Acidosis Chronic Hypervolemic Hyponatremia  Chronic Prolonged QT  CAD Severe Malnutrition  Social Issues  DVT Prophylaxis      Discharge Medications:   Medication List         albuterol 108 (90 BASE) MCG/ACT inhaler  Commonly known as:  PROVENTIL HFA;VENTOLIN HFA  Inhale 2 puffs into the lungs every 6 (six) hours as needed for wheezing or shortness of breath.     aspirin 81 MG chewable tablet  Chew 1 tablet (81 mg total) by mouth daily.     camphor-menthol lotion  Commonly known as:  SARNA  Apply 1 application topically every 8 (eight) hours as needed for itching.     diltiazem 120 MG 24 hr capsule  Commonly known as:  CARDIZEM CD  Take 1 capsule (120 mg total) by mouth daily.     feeding supplement (NEPRO CARB STEADY) Liqd  Take 237 mLs by mouth 2 (two) times daily between meals.     folic acid 1 MG tablet  Commonly known as:  FOLVITE  Take 1 tablet (1 mg total) by mouth daily.     hydrOXYzine 25 MG tablet  Commonly known as:  ATARAX/VISTARIL  Take 1 tablet (25 mg total) by mouth every 8 (eight) hours as needed for itching.     lactulose 10 GM/15ML solution  Commonly known as:  CHRONULAC  Take 15 mLs (10 g total) by mouth 3 (three) times daily.     lanthanum 1000 MG chewable tablet  Commonly known as:  FOSRENOL  Chew 1 tablet (1,000 mg total) by mouth 3 (three) times daily with meals.      QUEtiapine 50 MG tablet  Commonly known as:  SEROQUEL  Take 1 tablet (50 mg total) by mouth at bedtime.     sertraline 25 MG tablet  Commonly known as:  ZOLOFT  Take 1 tablet (25 mg total) by mouth daily.     tiotropium 18 MCG inhalation capsule  Commonly known as:  SPIRIVA  Place 1 capsule (18 mcg total) into inhaler and inhale daily.     traZODone 100 MG tablet  Commonly known as:  DESYREL  Take 1 tablet (100 mg total) by mouth at bedtime.        Disposition and follow-up:   Mr.Carl Benson was discharged from [facility] in Good condition.  At the hospital follow up visit please address:  1.  Mental Health - needs to attend [facility]  for continued care and assistance with obtaining medicaitons            ESRD - on HD TTS at [facility]                    Avoid beta blocker use in setting of cocaine abuse, h/o PAF - compliance with diltiazem              History of diastolic CHF per [facility] records  History of COPD per [facility] records- needs updated PFT            If candidate for treatment of chronic hepatitis C infection          Monitor QTc in setting of prolonged QT and medications    2.  Labs / imaging needed at time of follow-up: None  3.  Pending labs/ test needing follow-up: None  Follow-up Appointments:  Dr. Leonie DouglasStephanie Borum at [facility] [place] [facility] clinic on 02/15/2014 @ 9:00AM  Discharge Instructions: -Take Zoloft, Trazodone, and Seroquel daily for your mood  -Take aspirin and diltiazem daily for your blood pressure and atrial fibrillation  -Take Lanthanum three times daily with meals to lower your phosphate  -Take lactulose three times daily for your liver disease (it will make you have bowel movement)  -Take Spiriva daily for your COPD and use albuterol inhaler only as needed for shortness of breath and wheezing  -Attend your dialysis on Tue, Thurs, and Sat at [facility], Address is [address], [place], [place] [address],  phone number is [phone]  -Follow up with Dr. Leonie DouglasStephanie Borum On 02/15/2014. (9:00AM ) Contact information: [address], [place], [place] [address], phone number is [phone]  -There is also a behavioral health center at [facility] you can go to if you need help - [address],[place], [place] [address], [phone] -Pleasure taking care of you!   Consultations: Treatment Team:  Arita Missyan B Sanford, MD Psychiatry - Dr. Elsie SaasJonnalagadda  Procedures Performed:  Dg Chest 2 View  01/14/2014   CLINICAL DATA:  History of smoking and hypertension. Evaluate for CHF exacerbation.  EXAM: CHEST  2 VIEW  COMPARISON:  01/06/2014  FINDINGS: Two views of the chest demonstrate mild cardiomegaly. Central vascular structures are prominent but no overt pulmonary edema. No evidence for pleural effusions. Bony thorax is intact. Old right clavicle fracture.  IMPRESSION: Mild cardiomegaly with vascular congestion. No evidence for overt pulmonary edema.   Electronically Signed   By: Richarda OverlieAdam  Henn M.D.   On: 01/14/2014 09:05   Dg Abd 1 View  12/30/2013   CLINICAL DATA:  Constipation.  Mid abdominal pain.  EXAM: ABDOMEN - 1 VIEW  COMPARISON:  US PARACENTESIS dated 12/09/2013; DG ABD ACUTE W/CHEST dated 11/20/2013  FINDINGS: Centralized mildly prominent bowel loops, predominately small bowel loops. This centralization likely related to ascites. Mild gaseous distention may reflect mild ileus. No organomegaly. No free air.  IMPRESSION: Centralized, mildly prominent small bowel loops. Suspect ascites and ileus.   Electronically Signed   By: Charlett NoseKevin  Dover M.D.   On: 12/30/2013 04:06   Dg Chest Port 1 View  01/06/2014   CLINICAL DATA:  Dyspnea.  EXAM: PORTABLE CHEST - 1 VIEW  COMPARISON:  Chest radiograph performed 01/05/2014  FINDINGS: The lungs are well-aerated. Vascular congestion is noted. Bibasilar airspace opacification may reflect multifocal pneumonia or pulmonary edema. No pleural effusion or pneumothorax is seen.  The  cardiomediastinal silhouette is mildly enlarged. No acute osseous abnormalities are identified. Minimal lucency along the left chest wall is thought to reflect the interface with the patient's left arm.  IMPRESSION: Vascular congestion and mild cardiomegaly. Bibasilar airspace opacification may reflect multifocal pneumonia or mild pulmonary edema.   Electronically Signed   By: Roanna RaiderJeffery  Chang M.D.   On: 01/06/2014 23:35   Dg Chest Portable 1 View  01/05/2014   CLINICAL DATA:  58 year old male with shortness of breath. History of COPD and end-stage renal disease.  EXAM: PORTABLE CHEST -  1 VIEW  COMPARISON:  12/30/2013 and prior chest radiographs  FINDINGS: Cardiomegaly and interstitial pulmonary edema noted.  There is no evidence of focal airspace disease, suspicious pulmonary nodule/mass, pleural effusion, or pneumothorax. No acute bony abnormalities are identified. Is  IMPRESSION: Cardiomegaly with interstitial pulmonary edema.   Electronically Signed   By: Laveda Abbe M.D.   On: 01/05/2014 23:29   Dg Abd Acute W/chest  12/30/2013   CLINICAL DATA:  One day history of nausea and vomiting. Constipation.  EXAM: ACUTE ABDOMEN SERIES (ABDOMEN 2 VIEW & CHEST 1 VIEW) 12/30/2013 2049 hr:  COMPARISON:  DG ABDOMEN 1V dated 12/30/2013 0349 hr; DG CHEST 2 VIEW dated 12/22/2013; DG CHEST 2 VIEW dated 12/21/2013; DG CHEST 1V PORT dated 12/19/2013; DG ABD ACUTE W/CHEST dated 11/20/2013  FINDINGS: Mild gaseous distention of a solitary loop of small bowel in the left mid abdomen, with improvement in the bowel gas pattern since the examination earlier today. Gas in normal caliber colon. Scattered colonic and small bowel air-fluid levels on the decubitus image. No free intraperitoneal air. Phleboliths low in both sides of the pelvis.  Stable marked cardiomegaly. Pulmonary venous hypertension without overt edema. Scarring at the right lung base at the site of the prior pneumonia. Lungs otherwise clear. No localized airspace consolidation.  No pleural effusions. No pneumothorax.  IMPRESSION: 1. Improving partial small bowel obstruction or ileus, with fewer distended loops of small bowel in the abdomen when compared to the examination earlier same date. No free intraperitoneal air. 2. Stable marked cardiomegaly. Pulmonary venous hypertension without overt edema. Scarring at the right lung base. No acute cardiopulmonary disease.   Electronically Signed   By: Hulan Saas M.D.   On: 12/30/2013 21:13    2D Echo: none  Cardiac Cath: none  Admission HPI:   SHAMEEK NYQUIST is a 58 year old Veteran with past medical history of ESRD on TTS HD, COPD, HTN, decompensated liver cirrhosis, and afib (not on coumadin) who presents with active suicidal thoughts. Pt reports that after leaving the hospital AMA he he had no place to go and came to the ED today to die. Per ED note he stated that " I want be committed to the mental health ward at the [facility]" and "I skipped HD Thursday on purpose and don't want to go anymore." "I was going to take a knife and slash my fistula last night" and "I have a little boy in my head telling me to run out in traffic." "I wanted to see a psychiatrist and you can't make me go back out on the street." He is currently uncooperative, very combatative, and refusing to eat or have HD today. He reports being suicidal for the past 6 months and binged on crack cocaine 2 days ago. Per nephrology note, "the [facility] crisis line was contacted with the patient present and agreed to contact him on Monday 3/9 about his case. He then agreed to treatment until he received their response on Monday and will have dialysis today."   Pt recently hospitalized from 2/27 to 3/4 for worsening depression and PTSD (reported to flashbacks) with no SI or HI during hospitalization. Pt was at times irritable, agitated, confrontational, and anxious throughout hospitalization. He was initially found to require inpatient psychiatry treatment per psychiatry evaluation.  Pt was found to be medically stable and cleared for inpatient psychiatric treatment but placement was an issue as VA would not accept this patient due to his other medical problems (ESRD requiring HD). He was also  not appropriate for [facility] due to his medical acuity/lack of compliance and lack of SI/HI/Psychosis. Per psychiatric recommendations pt was placed under IVC due to multiple threats to leave AMA and because he was considered a threat to himself without proper medical treatment and appropriate psychosocial support. This was later reversed after his mood improved and he was no longer considered a threat to himself and was deemed appropriate to undergo outpatient psychiatric care which he stated he had an appointment on 3/6 at [facility] which was confirmed by psychiatry SW. Pt was started on sertraline 25 mg daily and quetiapine 50 mg daily during hospitalization per psychiatry recommendations. Pt was also continued on trazadone 50 mg daily at bedtime. He was discontinued on previous prazosin for PTSD. Pt stated he did not want to be on sertraline or quetiapine as they made his mood worse, as a result these medications were not prescribed or provided (per SW due to his insurance he was not eligible to receive any free medications or enrollment in [facility] program) to the patient at time he left AMA. There was also no 100% certainty that he would attend follow-up care for monitoring of these medications for it to be prescribed as there is increased risk of suicide risk with use and close monitoring is required.    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 Pt was previously hospitalized from 2/27- 3/4 for recurrent depression and PTSD without SI and had improved mood with sertraline, quetiapine, and trazodone. Pt was to follow up with outpatient psychiatry in [facility] on 3/6 but did not.  Pt with reported binge on crack cocaine two days prior to admission. Pt was not taking any medications since discharge because he stated he did not have money to obtain them.  Psychiatry was consulted who recommended acute psychiatric hospitalization for crisis stabilization, safety monitoring, and medication management. Pt's IVC was renewed on 3/17. Pt received sertraline 25 mg daily for depression and anxiety, quetiapine 50 mg daily for agitation, and trazadone 50 mg daily at bedtime for insomnia. Pt had safety sitter at bedside and ambulated from time to time as medical condition permitted per psychiatry recommendations. Pt was medically stable and awaiting availability of bed (psych or medicine) at [place] in [place] or [place] during hospitalization as he requested transfer to [facility] for further care of his acute psychiatric condition however there was unfortunately no available bed. Pt was also agreeable for transfer to [facility] and there was bed availiablie on 3/23, however later was confirmed there was no accepting physician. Pt was re-evaluated by psychiatry 3/24 and no longer needed inpatient psychiatric treatment or involuntary commitment. Pt was scheduled for follow-up appointment at [facility] clinic in [place] (his previous PCP in [place]) with date, time, address, and phone number given to patient. Pt's oldest son Roarke Marciano) was contacted who stated he will help his father with living arrangements and to establish care with the [place]. Pt son and daughter-in-law provided pt with support and were present at time of discharge and aware of discharge instructions. Pt was instructed to take sertraline 25 mg, quetiapine 50 mg, and trazadone 50 mg daily and given prescriptions which family stated they will help him fill. Pt was to be taken to [place] in [place] by family directly after discharge to help him establish care with [facility].  Pt was medically and mentally  stable on day of discharge. Pt with no SI and stable mood at time of discharge.    Hyperkalemia - Pt presented with K of 7.3 with no 12-lead EKG changes. In ED pt received bicarb, calcium gluconate, insulin and dextrose. Etiology due to missing HD session. Pt's potassium normalized after emergent HD and was monitored throughout hospitalization. Pt with recurrent hyperkalemia during hospitalization after pt's diet was changed to regular (with potassium restriction), later changed to renal diet with normalization of levels. Pt to continue HD on TTS schedule at [facility] which pt stated he will attend.    Hyperphosphatemia in setting of Secondary Hyperparathyroidism - Etiology most likely secondary due to ESRD.  Pt initially received  phoslo TID with meals, later changed to fosrenol and continued on weekly hectoral with HD with close monitoring of levels by nephrology. PTH level was elevated at 86 on 3/20.   ESRD - Pt received HD on 3/7, 3/10, 3/12, 3/14, 3/16, 3/19, 3/21, and 3/24 during hospitalization. Pt was followed by nephrology during hospitalization with close monitoring of pt's volume status. Pt was continued on phoslo 1334 TID initially later changed to fosrenol 4 TID with meals. Pt also received hectoral with HD and ESA weekly (beginning 3/24).  Pt to continue HD on TTS schedule at [facility] which pt stated he will attend.    Paroxysmal Atrial Fibrillation - Pt with brief episode of afib/aflutter that resolved with diltiazem administration. Pt's beta blocker was discontinued as pt had past and recent history of cocaine abuse. Pt remained in normal sinus rhythm with stable heart rate (84-120) during hospitalization. Pt with CHADS score of 1 (HTN) on daily AP therapy. Per [facility] records he was previously on diltiazem ER 120 mg daily and for a period of time on coumadin for [facility] therapy. Pt was continued on 81 mg aspirin daily and PO diltiazem CD 120 mg daily during hospitalization and to continue on  discharge. Pt should not be continued on beta blocker in the future due to history of cocaine abuse. Pt was monitored on telemetry during hospitalization with no cardiac events.     Hypertension -  Pt with blood pressure range of 98/69 - 189/101 during hospitalization. Pt was continued on PO diltiazem CD 120 mg daily.   Chronic Liver Cirrhosis -  Pt with liver function testing on admission that was at baseline. Etiology most likely due to chronic hepatitis C infection. Pt was continued on lactulose 10g TID, hydroxyzine 25 mg Q 6hr PRN pruritis, folic acid 1 mg daily, and phenergan as needed for nausea (zofran was avoided in setting of prolonged QT).  COPD - Pt with no acute exacerbations during hospitalization. Pt not on home oxygen, rescue or daily maintenance inhalers. Pt reported having PFT's at [place] in [place] and told he should be on home oxygen. Pt received albuterol nebulizer as needed in addition to spiriva 18 mcg daily for maintenance therapy and instructed to continue on discharge. Pt was given albuterol rescue inhaler to use as needed for acute bronchospasm.    Diastolic CHF - Per [facility] records pt with 2D-echo 10/2013 indicating diastolic dysfunction. Pt with elevated pro-BNP  >70K with CXR on admission with mild cardiomegaly and vascular congestion with no evidence for overt pulmonary edema. Pt with no acute exacerbation of CHF during hospitalization. Dischrage weight was 113 lb.   Chronic Microcytic Anemia - Pt with Hg at or near baseline of 10 during hospitalization. Etiology likely due to CKD.  Anemia panel on 3/19 revealed ferritin (1021 H), iron 121, TIBC 338, iron sat 36% most likely consistent with ACD. Pt to receive ESA weekly at HD starting 3/24. Pt had no active bleeding or hemodynamic instability during hospitalization.   Chronic Thrombocytopenia - Pt with platlet count near baseline of 100K during hospitalization. Pt with no active bleeding or bruising during hospitalization.      Chronic Anion Gap Metabolic Acidosis- Etiology most likely chronic due to hyperphosphatemia in setting of ESRD vs starvation ketoacidosis (poor PO intake as unable to afford food).   Chronic Hypervolemic Hyponatremia - Pt with chronic hyponatremia in setting of volume overload due to liver cirrhosis and ESRD that remained stable during hospitalization.   Chronic Prolonged QT - Pt with previous  prolonged QTc with  QTc of 475 -515 during hospitalizaiton. Etiology likely due to medications  (anti-psychotic mediations) vs chronic hypocalcemia in setting of secondary hyperparathyroidism. Pt was continued on anti-psychotic medications during hospitalization and per psychiatry dosage of pt's medication safe to use in setting of pt's prolonged QTc. Per guidelines if there is continued prolonged QT, there is no indication to stop medications.     Coronary Artery Disease - Pt with history of MI per VA records. Pt had no CP during hospitalization was continued on 81 mg aspirin daily. Pt not   Severe Malnutrition - Pt with chronic hypoalbuminemia in setting of ESRD and liver cirrhosis. Pt with evidence of severe muscle wasting and 8% weight loss in previous month with BMI of 20.12. Pt received adequate nutrition during hospitalization with diet and nutrition supplements Nepro Shake po TID.    Social Issues - Pt was homeless however was previously living at extended stay hotel. Pt unable to afford any of his medications, however reported he would receive VA check later this month. Pt was to follow-up with [facility] in [facility] on March 6 but did not attend. Pt requested transfer to [facility] however there were no available beds. Pt was able to reconnect with estranged oldest son Jayde Freeby) and he and his wife were present on discharge to  help him with living arrangements, obtain his medications, and establish care with the [place]. Pt's son and daughter-in-law were present at time of discharge and aware of discharge  instructions.    DVT Prophylaxis - Pt refused SQ heparin during hospitalizaiton with no evidence of thrombosis during hospitalization.    Discharge Vitals:   BP 121/75  Pulse 74  Temp(Src) 97.8 F (36.6 C) (Oral)  Resp 17  Ht 5\' 5"  (1.651 m)  Wt 117 lb 8.1 oz (53.3 kg)  BMI 19.55 kg/m2  SpO2 100%  Discharge Labs:  Results for orders placed during the hospital encounter of 01/14/14 (from the past 24 hour(s))  RENAL FUNCTION PANEL     Status: Abnormal   Collection Time    01/27/14  4:22 AM      Result Value Ref Range   Sodium 134 (*) 137 - 147 mEq/L   Potassium 5.8 (*) 3.7 - 5.3 mEq/L   Chloride 92 (*) 96 - 112 mEq/L   CO2 25  19 - 32 mEq/L   Glucose, Bld 92  70 - 99 mg/dL   BUN 45 (*) 6 - 23 mg/dL   Creatinine, Ser 4.69 (*) 0.50 - 1.35 mg/dL   Calcium 9.4  8.4 - 62.9 mg/dL   Phosphorus 6.7 (*) 2.3 - 4.6 mg/dL   Albumin 2.8 (*) 3.5 - 5.2 g/dL   GFR calc non Af [place]se Dago  14 (*) >90 mL/min   GFR calc Af Amer 17 (*) >90 mL/min    Signed:   Time Spent on Discharge: 60 minutes Services Ordered on Discharge: None Equipment Ordered on Discharge: None

## 2014-01-27 NOTE — Progress Notes (Addendum)
Subjective:  Pt seen and examined in AM. No acute events overnight. Pt reports he talked with his estranged son yesterday. He has no current complaints.    Objective: Vital signs in last 24 hours: Filed Vitals:   01/26/14 2111 01/27/14 0353 01/27/14 0807 01/27/14 0900  BP: 114/66 128/86  121/75  Pulse: 86 85  74  Temp: 98 F (36.7 C) 98.3 F (36.8 C)  97.8 F (36.6 C)  TempSrc: Oral Oral    Resp: 18 20  17   Height:      Weight:      SpO2: 97% 97% 100% 100%   Weight change:   Intake/Output Summary (Last 24 hours) at 01/27/14 1132 Last data filed at 01/27/14 0936  Gross per 24 hour  Intake    240 ml  Output    425 ml  Net   -185 ml   Physical Exam:  General appearance:  Malnourished Resp: Clear to ausculation bilaterally with no wheezing, rhonchi, or rales Cardio: Normal rate and regular rhythm   GI: Normal BS , soft and non-tender, mild distension  Extremities: no edema Psych: cooperative  Lab Results: Basic Metabolic Panel:  Recent Labs Lab 01/24/14 0720 01/27/14 0422  NA 131* 134*  K 5.9* 5.8*  CL 87* 92*  CO2 22 25  GLUCOSE 112* 92  BUN 88* 45*  CREATININE 6.74* 4.17*  CALCIUM 10.2 9.4  PHOS 7.6* 6.7*   Liver Function Tests:  Recent Labs Lab 01/24/14 0720 01/27/14 0422  ALBUMIN 2.9* 2.8*   CBC:  Recent Labs Lab 01/21/14 0909 01/24/14 0720  WBC 9.0 8.1  HGB 10.1* 9.7*  HCT 28.7* 27.6*  MCV 75.5* 75.2*  PLT 160 133*  BNP:  Studies/Results: No results found. Medications: I have reviewed the patient's current medications. Scheduled Meds: . aspirin  81 mg Oral Daily  . diltiazem  120 mg Oral Daily  . doxercalciferol  2 mcg Intravenous Q T,Th,Sa-HD  . feeding supplement (NEPRO CARB STEADY)  237 mL Oral BID BM  . folic acid  1 mg Oral Daily  . heparin  3,000 Units Dialysis Once in dialysis  . heparin  5,000 Units Subcutaneous 3 times per day  . lactulose  10 g Oral TID  . lanthanum  1,000 mg Oral TID WC  . QUEtiapine  50 mg  Oral QHS  . sertraline  25 mg Oral Daily  . sodium chloride  3 mL Intravenous Q12H  . tiotropium  18 mcg Inhalation Daily  . traZODone  100 mg Oral QHS   Continuous Infusions:  PRN Meds:.sodium chloride, sodium chloride, sodium chloride, acetaminophen, acetaminophen, albuterol, benzonatate, camphor-menthol, docusate sodium, guaiFENesin, hydrOXYzine, ondansetron (ZOFRAN) IV, ondansetron, sorbitol Assessment/Plan:  Assessment: 58 year old with ESRD on HD, decompensated liver cirrhosis, COPD, HTN, and AFib (not on coumadin), and PTSD with multiple ED admissions in the past 3 months who presented on 3/7 with active suicidal thoughts.   Plan:   Recurrent Severe Major Depression and PTSD with Suicidal Ideation - currently medically stable for transfer. Pt placed under IVC in ED on admission. Pt previously hospitalized from 2/27- 3/4 and started on sertraline 25 mg daily and quetiapine 50 mg daily. Pt was to follow up with outpatient psychiatry in [facility] on 3/6 but did not. Pt with reported binge on crack cocaine two days prior to admission.   -Appreciate Psychiatry consult --> acute psychiatric hospitalization for crisis stabilization, safety monitoring and medication management, to see again later today -Pt placed under  IVC on 3/7, renewed on 3/17  -Continue sertraline 25 mg daily for depression and anxiety  -Continue quetiapine 50 mg daily for agitation     -Continue trazadone 50 mg daily at bedtime for insomnia -Continue safety sitter at bedside, ambulation from time to time is ok with psychiatry if medical condition permits  -Awaiting availability of bed (psych or medicine) from [place] in [place] or [place], currently on diversion at [facility]. Now also awaiting [facility].  Contact for [facility] in [place] is Blane Ohara [phone] ext [phone]   Hyperkalemia - K 5.8 Most likely due to recent regular diet in setting of ESRD. -HD TTS -Obtain 12-lead EKG in AM -Continue to monitor    Hyperphosphatemia in setting of Secondary Hyperparathyroidism -  Etiology most likely secondary due to ESRD.  -PTH level on 3/19 --> normal 62.2  -Continue phoslo TID with meals  -Continue weekly hectoral with HD    ESRD - Pt on TTS schedule at [facility]. Received HD on 3/7, 3/10, 3/12, 3/14, 3/17, 19   -Regular diet with potassium restriction --> change to renal diet -Nephrology following, on TTS schedule  -Continue renavite daily and phoslo 1334 TID with meals  -Hectoral with HD, darbepoetin weekly -Monitor daily weight and volume status   Paroxysmal Atrial Fibrillation - currently NSR and normal HR. CHADS score of 1 (HTN) on daily AP therapy. Per VA records he was previously on diltiazem ER 120 mg daily and for a period of time on coumadin for [facility] therapy. -Continue 81 mg aspirin daily  -Continue PO diltiazem CD 120 mg daily  -Monitor on telemetry   Hypertension - Currently normotensive    -Continue PO diltiazem CD 120 mg daily   Chronic Microcytic Anemia - Pt with Hg near baseline of 10. Etiology likely due to CKD. Pt receives ESA and erythropoetin weekly at HD.  -3/19--> ferritin (1021 H), iron 121, TIBC 338, iron sat 36% -Monitor for bleeding  -Transfuse if Hg <7   Chronic Liver Cirrhosis - stable.  Pt with liver function testing on admission that appears at baseline. No active bleeding. Etiology most likely due to chronic hepatitis C infection.  -Continue lactulose 10g TID  -Continue hydroxyzine 25 mg Q 6hr PRN pruritis  -Continue folic acid 1 mg daily  -Phenergan PRN nausea (avoid zofran in setting of prolonged QT)   Severe Malnutrition - Pt with chronic hypoalbuminemia in setting of ESRD and liver cirrhosis. Pt with evidence of severe muscle wasting and 8% weight loss in last month with BMI of 20.12  -Regular diet with potassium restricton -Double trays and snacks  -Nepro Shake po TID  -Monitor weight    Social Issues - Currently homeless, was living at extended stay  hotel. Pt unable to afford any of his medications, however reported he would receive VA check later this month (spent on cocaine last month).  Pt was to follow-up with [facility] in [facility] on March 6 but did not attend. Pt requesting transfer to [place].  -CM/Psych SW following  -Awaiting bed at [facility] in [place] or [place], or [facility]  Diet: Renal  DVT Ppx: SQ heparin TID  Code: Full Dispo: Awaiting bed availability for transfer to [facility] or [place] or [facility]  The patient does not have a current PCP (No Pcp Per Patient) and does need an [facility] hospital follow-up appointment after discharge.  The patient does have transportation limitations that hinder transportation to clinic appointments.  .Services Needed at time of discharge: Y =  Yes, Blank = No PT: No  OT: No  RN: No  Equipment: None  Other: None    LOS: 13 days   Otis BraceMarjan Daeton Kluth, MD 01/27/2014, 11:32 AM

## 2014-01-27 NOTE — Clinical Social Work Psych Note (Signed)
Psych CSW provided update to MD regarding possible transfer to [facility]  Pt remains ready for dc.  [facility] notified via faxed referral.  Medical Director aware.  Vickii Penna, LCSWA [phone]  Clinical Social Work

## 2014-01-27 NOTE — Progress Notes (Signed)
Subjective:  Co cramps last hd signed off early  Objective Vital signs in last 24 hours: Filed Vitals:   01/26/14 1612 01/26/14 2111 01/27/14 0353 01/27/14 0807  BP: 146/92 114/66 128/86   Pulse: 88 86 85   Temp:  98 F (36.7 C) 98.3 F (36.8 C)   TempSrc:  Oral Oral   Resp: 16 18 20    Height:      Weight:      SpO2: 98% 97% 97% 100%  Physical Exam  General: alert nad /calm  Now /previously at nursing station demanding his brk tray "not be so Late" Heart: RRR  Lungs: cta bilat  Abdomen: mild ascites no change, soft NT  Extremities:no pedal edema  Dialysis Access: right upper AVGG + bruit   Dialysis: TTS [facility]  4h New dry wt lower ?? (was 55.5 kg) 2/2.25 Bath Heparin 3000 RUA AVG  Hect 2 EPO 2800 Venofer 50/wk   Assessment/Plan:  1..Suicidal ideation/PTSD/depression - [place] [facility]/ [facility]  Bed requested 2 ESRD TTS - usual orders/ noted hyperkalemia on regular diet and eating well  CHANGED back to RENAL diet yesterday  Fu k pre hd 3 HTN/volume- bp stable and only mild vol excess with his ascites =not able to decrease with hd so far with cramping / now gaining some body wt back Was eating double portion regular diet/ attempt 1 liter in am  4 Cirrhosis with mild ascites 5 PAF- in sinus rhythm, po dilt 120/d + asa  6 Anemia Hgb 10.1> 9.7 -was stable without ESA yet - restart esa / tfs = 36 %  weekly venofer to continue q wed hd 7 SHPT - P<6.7 7.6< 5.3 < 9.8 with phoslo 2ac and ca corrected 10.3 / hold hectorol 2  Last pth 11/2013 508 recheck in hosp/change phoslo to fosrenol ^ ca  8 Nutrition -  Now on renal diet sec. To [place] Bouchard^ K and  ^Phos  And renavite . Protein supplement  Weight change:   Labs: Basic Metabolic Panel:  Recent Labs Lab 01/21/14 0909 01/24/14 0720 01/27/14 0422  NA 133* 131* 134*  K 4.9 5.9* 5.8*  CL 91* 87* 92*  CO2 22 22 25   GLUCOSE 123* 112* 92  BUN 61* 88* 45*  CREATININE 5.51* 6.74* 4.17*  CALCIUM 9.8  10.2 9.4  PHOS 5.3* 7.6* 6.7*   Liver Function Tests:  Recent Labs Lab 01/21/14 0909 01/24/14 0720 01/27/14 0422  ALBUMIN 2.8* 2.9* 2.8*    Recent Labs Lab 01/21/14 0909 01/24/14 0720  WBC 9.0 8.1  HGB 10.1* 9.7*  HCT 28.7* 27.6*  MCV 75.5* 75.2*  PLT 160 133*   Medications:   . aspirin  81 mg Oral Daily  . diltiazem  120 mg Oral Daily  . doxercalciferol  2 mcg Intravenous Q T,Th,Sa-HD  . feeding supplement (NEPRO CARB STEADY)  237 mL Oral BID BM  . folic acid  1 mg Oral Daily  . heparin  3,000 Units Dialysis Once in dialysis  . heparin  5,000 Units Subcutaneous 3 times per day  . lactulose  10 g Oral TID  . lanthanum  1,000 mg Oral TID WC  . QUEtiapine  50 mg Oral QHS  . sertraline  25 mg Oral Daily  . sodium chloride  3 mL Intravenous Q12H  . tiotropium  18 mcg Inhalation Daily  . traZODone  100 mg Oral QHS   Lenny Pastelavid Jentzen Minasyan, PA-C [facility] Beeper [phone]  01/27/2014,8:29 AM  LOS: 13 days

## 2014-01-27 NOTE — Progress Notes (Signed)
  Date: 01/27/2014  Patient name: Carl Benson  Medical record number: [id number]  Date of birth: 08-01-1956   This patient has been seen and the plan of care was discussed with the house staff. Please see their note for complete details. I concur with their findings with the following additions/corrections: Feels well today. Nephrology following. Still pending placement into inpatient psychiatric facility.  Jonah Blue, DO, FACP Faculty [facility] 01/27/2014, 11:35 AM

## 2014-01-27 NOTE — Progress Notes (Signed)
I saw the patient and agree with the above assessment and plan.    

## 2014-01-28 MED ORDER — DOXERCALCIFEROL 4 MCG/2ML IV SOLN
INTRAVENOUS | Status: AC
  Filled 2014-01-28: qty 2

## 2014-01-28 MED ORDER — HYDROXYZINE HCL 25 MG PO TABS
ORAL_TABLET | ORAL | Status: AC
  Administered 2014-01-28: 25 mg
  Filled 2014-01-28: qty 1

## 2014-01-28 NOTE — Procedures (Signed)
Tolerating hemodialysis. He is appropriate.  He is willing to go to behavioral health or [facility].  He has requested additional fluid removal today and we will comply. Janes Colegrove C

## 2014-01-28 NOTE — Progress Notes (Signed)
Subjective: No overnight issues. Tolerating HD this morning. [place] [facility] [place] [facility] [place]. Preoccupied with his upcoming direct deposit this Wednesday. Denies CP, SOB, N/V/D, or abdominal pain.   Objective: Vital signs in last 24 hours: Filed Vitals:   01/28/14 0906 01/28/14 0930 01/28/14 1000 01/28/14 1030  BP: 134/89 154/87 140/87 143/86  Pulse: 91 92 90 89  Temp:      TempSrc:      Resp:      Height:      Weight:      SpO2:       Weight change: 7 lb 3.1 oz (3.264 kg)  Intake/Output Summary (Last 24 hours) at 01/28/14 1054 Last data filed at 01/28/14 0820  Gross per 24 hour  Intake    720 ml  Output      0 ml  Net    720 ml   Vitals reviewed. General: resting in bed, in NAD, undergoing HD HEENT:  no scleral icterus Cardiac: RRR, no rubs, murmurs or gallops Pulm: clear to auscultation bilaterally, no wheezes, rales, or rhonchi Abd: soft, nontender, BS present. Mild distention.  Ext: warm and well perfused, no pedal edema Neuro: alert and oriented X3 Psych: Cooperative  Lab Results: Basic Metabolic Panel:  Recent Labs Lab 01/24/14 0720 01/27/14 0422  NA 131* 134*  K 5.9* 5.8*  CL 87* 92*  CO2 22 25  GLUCOSE 112* 92  BUN 88* 45*  CREATININE 6.74* 4.17*  CALCIUM 10.2 9.4  PHOS 7.6* 6.7*   Liver Function Tests:  Recent Labs Lab 01/24/14 0720 01/27/14 0422  ALBUMIN 2.9* 2.8*   CBC:  Recent Labs Lab 01/24/14 0720  WBC 8.1  HGB 9.7*  HCT 27.6*  MCV 75.2*  PLT 133*   Anemia Panel:  Recent Labs Lab 01/26/14 1132  FERRITIN 1021*  TIBC 338  IRON 121    Micro Results: No results found for this or any previous visit (from the past 240 hour(s)). Studies/Results: No results found. Medications: I have reviewed the patient's current medications. Scheduled Meds: . aspirin  81 mg Oral Daily  . diltiazem  120 mg Oral Daily  . doxercalciferol      . doxercalciferol  2 mcg Intravenous Q T,Th,Sa-HD  . feeding supplement (NEPRO  CARB STEADY)  237 mL Oral BID BM  . folic acid  1 mg Oral Daily  . heparin  3,000 Units Dialysis Once in dialysis  . heparin  5,000 Units Subcutaneous 3 times per day  . hydrOXYzine      . lactulose  10 g Oral TID  . lanthanum  1,000 mg Oral TID WC  . QUEtiapine  50 mg Oral QHS  . sertraline  25 mg Oral Daily  . sodium chloride  3 mL Intravenous Q12H  . tiotropium  18 mcg Inhalation Daily  . traZODone  100 mg Oral QHS   Continuous Infusions:  PRN Meds:.sodium chloride, sodium chloride, sodium chloride, acetaminophen, acetaminophen, albuterol, benzonatate, camphor-menthol, docusate sodium, guaiFENesin, hydrOXYzine, ondansetron (ZOFRAN) IV, ondansetron, sorbitol Assessment/Plan: 58 year old with ESRD on HD, decompensated liver cirrhosis, COPD, HTN, and AFib (not on coumadin), and PTSD with multiple ED admissions in the past 3 months who presented on 3/7 with active suicidal thoughts.   Recurrent Severe Major Depression and PTSD with Suicidal Ideation - currently medically stable for transfer. Pt placed under IVC on admission which has been renewed. Pt previously hospitalized from 2/27- 3/4 and started on sertraline 25 mg daily and quetiapine 50  mg daily. Pt was to follow up with outpatient psychiatry in [facility] on 3/6 but did not. Pt with reported binge on crack cocaine two days prior to admission. Psychiatry following. Patient medically stable and awaiting transfer to psychiatric hospitalization.  -Psychiatry following  -Pt placed under IVC on 3/7, renewed on 3/17  -sertraline 25 mg daily for depression and anxiety  -quetiapine 50 mg daily for agitation  -trazadone 50 mg daily at bedtime for insomnia  -Sitter at bedside, ambulation from time to time is ok with psychiatry if medical condition permits  -Awaiting availability of bed (psych or medicine) from [place] in [place] or [place], currently on diversion at [place]. Now also awaiting [facility]. Contact for [facility] in [place] is Blane Ohara [phone] ext [phone]   Hyperkalemia - He has mild hyperkalemia prior to HD. Most likely due to recent regular diet in setting of ESRD.  -HD TTS  -Continue to monitor   Hyperphosphatemia in setting of Secondary Hyperparathyroidism - Etiology most likely secondary due to ESRD.  -PTH level on 3/19 --> normal 62.2  -Continue phoslo TID with meals  -Continue weekly hectoral with HD   ESRD - Pt on TTS schedule at [facility]. Received HD on 3/7, 3/10, 3/12, 3/14, 3/17, 19  -Regular diet with potassium restriction --> change to renal diet  -Nephrology following, on TTS schedule  -Continue renavite daily and phoslo 1334 TID with meals  -Hectoral with HD, darbepoetin weekly  -Monitor daily weight and volume status   Paroxysmal Atrial Fibrillation - currently NSR and normal HR. CHADS score of 1 (HTN) on daily AP therapy. Per [facility] records he was previously on diltiazem ER 120 mg daily and for a period of time on coumadin for [facility] therapy.  -Continue 81 mg aspirin daily  -Continue PO diltiazem CD 120 mg daily  -Monitor on telemetry   Hypertension - Currently normotensive  -Continue PO diltiazem CD 120 mg daily   Chronic Microcytic Anemia - Pt with Hg near baseline of 10. Etiology likely due to CKD. Pt receives ESA and erythropoetin weekly at HD.  -3/19--> ferritin (1021 H), iron 121, TIBC 338, iron sat 36%  -Monitor for bleeding  -Transfuse if Hg <7   Chronic Liver Cirrhosis - stable. Pt with liver function testing on admission that appears at baseline. No active bleeding. Etiology most likely due to chronic hepatitis C infection.  -Continue lactulose 10g TID  -Continue hydroxyzine 25 mg Q 6hr PRN pruritis  -Continue folic acid 1 mg daily  -Phenergan PRN nausea (avoid zofran in setting of prolonged QT)   Severe Malnutrition - Pt with chronic hypoalbuminemia in setting of ESRD and liver cirrhosis. Pt with evidence of severe muscle wasting and 8% weight loss in last month with BMI of 20.12    -Renal diet -Double trays and snacks  -Nepro Shake po TID  -Monitor weight   Social Issues - Currently homeless, was living at extended stay hotel. Pt unable to afford any of his medications, however reported he would receive VA check later this month (spent on cocaine last month). Pt was to [facility]up with [facility] in [facility] on March 6 but did not attend. Pt requesting transfer to [place].  -CM/Psych SW following  -Awaiting bed at [facility] in [place] or [place], or [facility]   Diet: Renal   DVT Ppx: SQ heparin TID   Code: Full   Dispo: Awaiting bed availability for transfer to [facility] or [place] or [facility]  The patient  does not have a current PCP (No Pcp Per Patient) and does need an [facility] hospital follow-up appointment after discharge.  The patient does have transportation limitations that hinder transportation to clinic appointments.  .Services Needed at time of discharge: Y = Yes, Blank = No PT:   OT:   RN:   Equipment:   Other:     LOS: 14 days   Ky Barban, MD 01/28/2014, 10:54 AM

## 2014-01-28 NOTE — Progress Notes (Signed)
Weekend CSW spoke with Pieter Partridge at [facility], patient declined due to need for dialysis.  Samuella Bruin, MSW, LCSWA Clinical Social Worker [facility] Emergency Dept. [phone]

## 2014-01-28 NOTE — Progress Notes (Signed)
Weekend CSW following up on inpatient psychiatric placement. Patient medically stable for placement. CSW spoke with Tina (AC[facility] at [facility] regarding bed availability. AC states that [facility] is currently at capacity and will not have bed availability until Monday 3/23 at the earliest. AC confirms that patient will be placed on wait list and [facility] staff will contact CSW when bed becomes available. AC encouraged CSW to make referrals to other facilities in the meantime. CSW agreeable and will continue to follow for placement.  Peg Fifer, MSW, LCSWA Clinical Social Worker [place] [facility] Emergency Dept. [phone]  

## 2014-01-28 NOTE — Progress Notes (Signed)
Weekend CSW contacted [facility]- at capacity.   Jabier Deese, MSW, LCSWA  Clinical Social Worker  [facility] Emergency Dept.  [phone]  

## 2014-01-29 MED ORDER — DARBEPOETIN ALFA-POLYSORBATE 25 MCG/0.42ML IJ SOLN
25.0000 ug | INTRAMUSCULAR | Status: DC
  Administered 2014-01-31: 25 ug via INTRAVENOUS
  Filled 2014-01-29: qty 0.42

## 2014-01-29 MED ORDER — SODIUM CHLORIDE 0.9 % IV SOLN
62.5000 mg | INTRAVENOUS | Status: DC
  Administered 2014-01-31: 62.5 mg via INTRAVENOUS
  Filled 2014-01-29 (×2): qty 5

## 2014-01-29 NOTE — Progress Notes (Signed)
[facility] Progress Note  Subjective:   No problems with dialysis. Has not had any access interventions since he moved to [place]  Objective Filed Vitals:   01/28/14 2021 01/29/14 0550 01/29/14 0820 01/29/14 0853  BP: 139/78 144/92  146/81  Pulse: 91 88  84  Temp: 98.9 F (37.2 C) 97.9 F (36.6 C)  97.6 F (36.4 C)  TempSrc: Oral Oral  Oral  Resp: 19 18  18   Height:      Weight: 52.209 kg (115 lb 1.6 oz)     SpO2: 98% 98% 98% 100%   Physical Exam General:thin NAD, alert talking with sitter and nursing asst Heart: RRR Lungs: no wheezes or rales Abdomen: soft slight distension Extremities: tr LE edema Dialysis Access: right upper AVGG + bruit buttonholes  Dialysis: TTS [facility]  4h New dry wt lower ?? (was 55.5 kg) 2/2.25 Bath Heparin 3000 RUA AVG  Hect 2 EPO 2800 Venofer 50/wk   Assessment/Plan:  1..Suicidal ideation/PTSD/depression - awaiting transfer for inpatient care when bed available; multiple sites contacted; adm here 3/07 2 ESRD TTS -K 5.8 on Friday k not done pre HD Saturday - recheck Monday am; on renal diet - evaluate for access issues 3 HTN/volume- UF 3.5 on 3/21 with post weight 51.2 - need to be sure we get standing weights - still needs volume down some 4 Cirrhosis with mild ascites - stable 5 PAF- in sinus rhythm, po dilt 120/d + asa  6 Anemia Hgb 10.1> 9.7 -without esa - restart esa 3/24  add weekly IV Fe 7 SHPT - P<6.7 7.6< 5.3 < 9.8 with phoslo 2ac and ca corrected 10.3 / hold hectorol 2 Last pth 11/2013 508 recheck in hosp/change phoslo to fosrenol ^ ca  8 Nutrition - Now on renal diet sec. To ^ K and ^Phos; mutivit and nepro (had Ensure at bedside but said he hasn't had any in two days - Ensure much higher in K than Nepro)  Sheffield SliderMartha B Bergman, PA-C [facility] Beeper [phone] 01/29/2014,10:18 AM  LOS: 15 days   Renal Attending Agree with eval and mgmt as articulated above.  Possible [facility] admission soon.  Cont TTS  HD. [place] Howse C    Additional Objective Labs: Basic Metabolic Panel:  Recent Labs Lab 01/24/14 0720 01/27/14 0422  NA 131* 134*  K 5.9* 5.8*  CL 87* 92*  CO2 22 25  GLUCOSE 112* 92  BUN 88* 45*  CREATININE 6.74* 4.17*  CALCIUM 10.2 9.4  PHOS 7.6* 6.7*   Liver Function Tests:  Recent Labs Lab 01/24/14 0720 01/27/14 0422  ALBUMIN 2.9* 2.8*   CBC:  Recent Labs Lab 01/24/14 0720  WBC 8.1  HGB 9.7*  HCT 27.6*  MCV 75.2*  PLT 133*    Studies/Results: No results found. Medications:   . aspirin  81 mg Oral Daily  . diltiazem  120 mg Oral Daily  . doxercalciferol  2 mcg Intravenous Q T,Th,Sa-HD  . feeding supplement (NEPRO CARB STEADY)  237 mL Oral BID BM  . folic acid  1 mg Oral Daily  . heparin  3,000 Units Dialysis Once in dialysis  . heparin  5,000 Units Subcutaneous 3 times per day  . lactulose  10 g Oral TID  . lanthanum  1,000 mg Oral TID WC  . QUEtiapine  50 mg Oral QHS  . sertraline  25 mg Oral Daily  . sodium chloride  3 mL Intravenous Q12H  . tiotropium  18 mcg Inhalation Daily  .  traZODone  100 mg Oral QHS

## 2014-01-29 NOTE — Progress Notes (Signed)
Weekend CSW faxed referral to [facility]- they have not reviewed referral at this time.  Samuella Bruin, MSW, LCSWA Clinical Social Worker [facility] Emergency Dept. [phone]

## 2014-01-29 NOTE — Progress Notes (Signed)
Subjective: Patient seen and examined this morning.   No complaints this morning, still awaiting to hear if [facility] will be [facility].  Denies chest pain, SOB, abdominal pain, N/V/D.   Objective: Vital signs in last 24 hours: Filed Vitals:   01/29/14 0550 01/29/14 0820 01/29/14 0853 01/29/14 1205  BP: 144/92  146/81 146/79  Pulse: 88  84 90  Temp: 97.9 F (36.6 C)  97.6 F (36.4 C) 97.6 F (36.4 C)  TempSrc: Oral  Oral Oral  Resp: 18  18 18   Height:      Weight:      SpO2: 98% 98% 100% 100%   Weight change: -3 lb 0.1 oz (-1.364 kg)  Intake/Output Summary (Last 24 hours) at 01/29/14 1302 Last data filed at 01/29/14 1258  Gross per 24 hour  Intake    840 ml  Output   3543 ml  Net  -2703 ml   Vitals reviewed.  General: resting in bed, in NAD, sitting at the side of his bed  HEENT: no scleral icterus  Cardiac: RRR, no rubs, murmurs or gallops  Pulm: clear to auscultation bilaterally, no wheezes, rales, or rhonchi  Abd: soft, nontender, BS present. Mild distention.  Ext: warm and well perfused, no pedal edema  Neuro: alert and oriented X3  Psych: Cooperative  Lab Results: Basic Metabolic Panel:  Recent Labs Lab 01/24/14 0720 01/27/14 0422  NA 131* 134*  K 5.9* 5.8*  CL 87* 92*  CO2 22 25  GLUCOSE 112* 92  BUN 88* 45*  CREATININE 6.74* 4.17*  CALCIUM 10.2 9.4  PHOS 7.6* 6.7*   Liver Function Tests:  Recent Labs Lab 01/24/14 0720 01/27/14 0422  ALBUMIN 2.9* 2.8*   CBC:  Recent Labs Lab 01/24/14 0720  WBC 8.1  HGB 9.7*  HCT 27.6*  MCV 75.2*  PLT 133*   Anemia Panel:  Recent Labs Lab 01/26/14 1132  FERRITIN 1021*  TIBC 338  IRON 121   Medications: I have reviewed the patient's current medications. Scheduled Meds: . aspirin  81 mg Oral Daily  . [START ON 01/31/2014] darbepoetin (ARANESP) injection - DIALYSIS  25 mcg Intravenous Q Tue-HD  . diltiazem  120 mg Oral Daily  . doxercalciferol  2 mcg Intravenous Q T,Th,Sa-HD    . feeding supplement (NEPRO CARB STEADY)  237 mL Oral BID BM  . [START ON 01/31/2014] ferric gluconate (FERRLECIT/NULECIT) IV  62.5 mg Intravenous Q Tue-HD  . folic acid  1 mg Oral Daily  . heparin  3,000 Units Dialysis Once in dialysis  . heparin  5,000 Units Subcutaneous 3 times per day  . lactulose  10 g Oral TID  . lanthanum  1,000 mg Oral TID WC  . QUEtiapine  50 mg Oral QHS  . sertraline  25 mg Oral Daily  . sodium chloride  3 mL Intravenous Q12H  . tiotropium  18 mcg Inhalation Daily  . traZODone  100 mg Oral QHS   Continuous Infusions:  PRN Meds:.sodium chloride, sodium chloride, sodium chloride, acetaminophen, acetaminophen, albuterol, benzonatate, camphor-menthol, docusate sodium, guaiFENesin, hydrOXYzine, ondansetron (ZOFRAN) IV, ondansetron, sorbitol Assessment/Plan: 58 year old with ESRD on HD, decompensated liver cirrhosis, COPD, HTN, and AFib (not on coumadin), and PTSD with multiple ED admissions in the past 3 months who presented on 3/7 with active suicidal thoughts.   Recurrent Severe Major Depression and PTSD with Suicidal Ideation - currently medically stable for transfer. Pt placed under IVC on admission which has been renewed. Pt previously  hospitalized from 2/27- 3/4 and started on sertraline 25 mg daily and quetiapine 50 mg daily. Pt was to follow up with outpatient psychiatry in [facility] on 3/6 but did not. Pt with reported binge on crack cocaine two days prior to admission. Psychiatry following. Patient medically stable and awaiting transfer to psychiatric hospitalization.  -Psychiatry following  -Pt placed under IVC on 3/7, renewed on 3/17  -sertraline 25 mg daily for depression and anxiety  -quetiapine 50 mg daily for agitation  -trazadone 50 mg daily at bedtime for insomnia  -Sitter at bedside, ambulation from time to time is ok with psychiatry if medical condition permits  -CSW actively working for transfer to inpatient psychiatry   Hyperkalemia - He  has had mild hyperkalemia prior to HD. Most likely due to recent regular diet in setting of ESRD.  -HD TTS  -renal function panel daily  ESRD - Pt on TTS schedule at [place].  -Nephrology following -HD per regular schedule  Paroxysmal Atrial Fibrillation - currently NSR and normal HR. CHADS score of 1 (HTN) on daily AP therapy. -Continue 81 mg aspirin daily  -Continue PO diltiazem CD 120 mg daily   Hypertension - Currently normotensive  -Continue PO diltiazem CD 120 mg daily   Chronic Microcytic Anemia - Pt with Hg near baseline of 10. Etiology likely due to CKD. Pt receives ESA and erythropoetin weekly at HD.   Chronic Liver Cirrhosis - stable. Pt with liver function testing on admission that appears at baseline.  Etiology most likely due to chronic hepatitis C infection.  -Continue lactulose 10g TID  -Continue hydroxyzine 25 mg Q 6hr PRN pruritis  -Continue folic acid 1 mg daily   Severe Malnutrition - Pt with chronic hypoalbuminemia in setting of ESRD and liver cirrhosis. Pt with evidence of severe muscle wasting and 8% weight loss in last month with BMI of 20.12  -Renal diet  -Double trays and snacks  -Nepro Shake po TID   Social Issues - Currently homeless, was living at extended stay hotel. Pt unable to afford any of his medications, however reported he would receive VA check later this month (spent on cocaine last month). Pt [facility] to [facility] with [facility] on March 6 but did not attend. Pt requesting transfer to [place].  -CM/Psych SW following  -Awaiting bed at [facility]  Diet: Renal   DVT Ppx: SQ heparin TID   Code: Full   Dispo: On [facility] waiting list, medically stable and awaiting transfer.   The patient does not have a current PCP and does need an [facility] hospital follow-up appointment after discharge.  The patient does have transportation limitations that hinder transportation to clinic appointments.  .Services Needed at time of discharge: Y = Yes, Blank = No PT:     OT:   RN:   Equipment:   Other:     LOS: 15 days   Ky Barban, MD 01/29/2014, 1:02 PM

## 2014-01-30 LAB — RENAL FUNCTION PANEL
Albumin: 3 g/dL — ABNORMAL LOW (ref 3.5–5.2)
BUN: 55 mg/dL — ABNORMAL HIGH (ref 6–23)
CO2: 26 mEq/L (ref 19–32)
Calcium: 10.2 mg/dL (ref 8.4–10.5)
Chloride: 91 mEq/L — ABNORMAL LOW (ref 96–112)
Creatinine, Ser: 5 mg/dL — ABNORMAL HIGH (ref 0.50–1.35)
GFR calc Af Amer: 13 mL/min — ABNORMAL LOW (ref >90)
GFR calc non Af Amer: 11 mL/min — ABNORMAL LOW (ref >90)
Glucose, Bld: 99 mg/dL (ref 70–99)
Phosphorus: 5.1 mg/dL — ABNORMAL HIGH (ref 2.3–4.6)
Potassium: 6 mEq/L — ABNORMAL HIGH (ref 3.7–5.3)
Sodium: 135 mEq/L — ABNORMAL LOW (ref 137–147)

## 2014-01-30 LAB — PARATHYROID HORMONE, INTACT (NO CA): PTH: 86 pg/mL — ABNORMAL HIGH (ref 14.0–72.0)

## 2014-01-30 MED ORDER — SODIUM POLYSTYRENE SULFONATE 15 GM/60ML PO SUSP
15.0000 g | Freq: Once | ORAL | Status: AC
  Administered 2014-01-30: 15 g via ORAL
  Filled 2014-01-30: qty 60

## 2014-01-30 MED ORDER — RENA-VITE PO TABS
1.0000 | ORAL_TABLET | Freq: Every day | ORAL | Status: DC
  Filled 2014-01-30: qty 1

## 2014-01-30 NOTE — Progress Notes (Signed)
Subjective:  No cos eating brk/ tells me "[facility]" Objective Vital signs in last 24 hours: Filed Vitals:   01/29/14 1827 01/29/14 2059 01/30/14 0546 01/30/14 0806  BP: 149/88 149/91 136/86 163/96  Pulse: 86 88 82 82  Temp: 98.4 F (36.9 C) 98.7 F (37.1 C) 98.5 F (36.9 C) 97.7 F (36.5 C)  TempSrc: Oral Oral Oral Oral  Resp: 18 17 16 18   Height:      Weight:  54.205 kg (119 lb 8 oz)    SpO2: 100% 100% 98% 100%  Physical Exam  General:laert, nad, currently Appropriate  Heart: RRRn rub, mur, gal  Lungs:CTA bilat  Abdomen:nontender soft slight distension with some ascites  Extremities: no pedal edema  Dialysis Access: right upper AVGG + bruit   Dialysis: TTS [facility]  4h New dry wt lower ?? (was 55.5 kg) 2/2.25 Bath Heparin 3000 RUA AVG  Hect 2 EPO 2800 Venofer 50/wk   Assessment/Plan:  1..Suicidal ideation/PTSD/depression - awaiting transfer for inpatient care when bed available;adm here 01/14/2014 2 ESRD TTS -K 5.8 on Friday k not done pre HD Saturday - recheck Monday am; on renal diet - evaluate for access issues  3 HTN/volume- am bp 163/96 last hd sat 3/21= per pt request  UF 3.5  with post weight 51.2 -  He requests 3 l uf in am / 4 Cirrhosis with mild ascites - stable /on  tid lactulose  5 PAF- in sinus rhythm, po dilt 120/d + asa  6 Anemia Hgb  9.7 -without esa - restarted esa 3/24 added  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 To ^ K and ^Phos; mutivit and nepro (had Ensure at bedside but said he hasn't had any in two days - Ensure much higher in K than Nepro)  Carl Pastel, PA-C [facility] Beeper [phone] 01/30/2014,8:10 AM  LOS: 16 days   Pt seen, examined, agree w assess/plan as above with additions as indicated.  [name] MD pager [phone]    cell [phone] 01/30/2014,  11:59 AM    s Labs: Basic Metabolic Panel:  Recent Labs Lab 01/24/14 0720 01/27/14 0422  NA 131* 134*  K 5.9* 5.8*  CL 87* 92*  CO2 22 25  GLUCOSE 112* 92  BUN 88* 45*  CREATININE 6.74* 4.17*  CALCIUM 10.2 9.4  PHOS 7.6* 6.7*   Liver Function Tests:  Recent Labs Lab 01/24/14 0720 01/27/14 0422  ALBUMIN 2.9* 2.8*   CBC:  Recent Labs Lab 01/24/14 0720  WBC 8.1  HGB 9.7*  HCT 27.6*  MCV 75.2*  PLT 133*   Studies/Results: No results found. Medications:   . aspirin  81 mg Oral Daily  . [START ON 01/31/2014] darbepoetin (ARANESP) injection - DIALYSIS  25 mcg Intravenous Q Tue-HD  . diltiazem  120 mg Oral Daily  . doxercalciferol  2 mcg Intravenous Q T,Th,Sa-HD  . feeding supplement (NEPRO CARB STEADY)  237 mL Oral BID BM  . [START ON 01/31/2014] ferric gluconate (FERRLECIT/NULECIT) IV  62.5 mg Intravenous Q Tue-HD  . folic acid  1 mg Oral Daily  . heparin  3,000 Units Dialysis Once in dialysis  . heparin  5,000 Units Subcutaneous 3 times per day  . lactulose  10 g Oral TID  . lanthanum  1,000 mg  Oral TID WC  . QUEtiapine  50 mg Oral QHS  . sertraline  25 mg Oral Daily  . sodium chloride  3 mL Intravenous Q12H  . tiotropium  18 mcg Inhalation Daily  . traZODone  100 mg Oral QHS

## 2014-01-30 NOTE — Progress Notes (Addendum)
Subjective:  Pt seen and examined in AM. No acute events overnight. Pt is getting impatient regarding bed status.   Objective: Vital signs in last 24 hours: Filed Vitals:   01/29/14 2059 01/30/14 0546 01/30/14 0806 01/30/14 1223  BP: 149/91 136/86 163/96 144/89  Pulse: 88 82 82 81  Temp: 98.7 F (37.1 C) 98.5 F (36.9 C) 97.7 F (36.5 C) 97.9 F (36.6 C)  TempSrc: Oral Oral Oral Oral  Resp: 17 16 18 18   Height:      Weight: 54.205 kg (119 lb 8 oz)     SpO2: 100% 98% 100% 100%   Weight change: -0.995 kg (-2 lb 3.1 oz)  Intake/Output Summary (Last 24 hours) at 01/30/14 1413 Last data filed at 01/30/14 1000  Gross per 24 hour  Intake    483 ml  Output      0 ml  Net    483 ml   Physical Exam:  General appearance:  Malnourished Resp: Clear to ausculation bilaterally with no wheezing, rhonchi, or rales Cardio: Normal rate and regular rhythm   GI: Normal BS , soft and non-tender, mild distension  Extremities: no edema Psych: cooperative  Lab Results: Basic Metabolic Panel:  Recent Labs Lab 01/27/14 0422 01/30/14 0558  NA 134* 135*  K 5.8* 6.0*  CL 92* 91*  CO2 25 26  GLUCOSE 92 99  BUN 45* 55*  CREATININE 4.17* 5.00*  CALCIUM 9.4 10.2  PHOS 6.7* 5.1*   Liver Function Tests:  Recent Labs Lab 01/27/14 0422 01/30/14 0558  ALBUMIN 2.8* 3.0*   CBC:  Recent Labs Lab 01/24/14 0720  WBC 8.1  HGB 9.7*  HCT 27.6*  MCV 75.2*  PLT 133*  BNP:  Studies/Results: No results found. Medications: I have reviewed the patient's current medications. Scheduled Meds: . aspirin  81 mg Oral Daily  . [START ON 01/31/2014] darbepoetin (ARANESP) injection - DIALYSIS  25 mcg Intravenous Q Tue-HD  . diltiazem  120 mg Oral Daily  . doxercalciferol  2 mcg Intravenous Q T,Th,Sa-HD  . feeding supplement (NEPRO CARB STEADY)  237 mL Oral BID BM  . [START ON 01/31/2014] ferric gluconate (FERRLECIT/NULECIT) IV  62.5 mg Intravenous Q Tue-HD  . folic acid  1 mg Oral Daily  .  heparin  3,000 Units Dialysis Once in dialysis  . heparin  5,000 Units Subcutaneous 3 times per day  . lactulose  10 g Oral TID  . lanthanum  1,000 mg Oral TID WC  . QUEtiapine  50 mg Oral QHS  . sertraline  25 mg Oral Daily  . sodium chloride  3 mL Intravenous Q12H  . tiotropium  18 mcg Inhalation Daily  . traZODone  100 mg Oral QHS   Continuous Infusions:  PRN Meds:.sodium chloride, sodium chloride, sodium chloride, acetaminophen, acetaminophen, albuterol, benzonatate, camphor-menthol, docusate sodium, guaiFENesin, hydrOXYzine, ondansetron (ZOFRAN) IV, ondansetron, sorbitol Assessment/Plan:  Assessment: 58 year old with ESRD on HD, decompensated liver cirrhosis, COPD, HTN, and AFib (not on coumadin), and PTSD with multiple ED admissions in the past 3 months who presented on 3/7 with active suicidal thoughts.   Plan:   Recurrent Severe Major Depression and PTSD with Suicidal Ideation - currently medically stable for transfer. Pt placed under IVC in ED on admission. Pt previously hospitalized from 2/27- 3/4 and started on sertraline 25 mg daily and quetiapine 50 mg daily. Pt was to follow up with outpatient psychiatry in [facility] on 3/6 but did not. Pt with reported binge on crack cocaine  two days prior to admission.   -Appreciate Psychiatry consult  -Pt to undergo acute psychiatric hospitalization for crisis stabilization, safety monitoring and medication management -Pt placed under IVC on 3/7, renewed on 3/17  -Continue sertraline 25 mg daily for depression and anxiety  -Continue quetiapine 50 mg daily for agitation     -Continue trazadone 50 mg daily at bedtime for insomnia -Continue safety sitter at bedside, ambulation from time to time is ok with psychiatry if medical condition permits  -A[place] availability [place] [place], [facility]. Now also awaiting [facility] placement.  Contact for [facility] in [place] is Blane OharaLaurie Vasey  [phone] ext [phone]   Hyperkalemia - K 6.0. Most likely due to recent regular diet in setting of ESRD. -HD TTS -Monitor for chest pain  -Evaluate for access issues  Hyperphosphatemia in setting of Secondary Hyperparathyroidism -  Etiology most likely secondary due to ESRD. PTH level on 3/20 was high (86).   -Continue phoslo TID with meals --> changed to fosrenol 1000 mg TID with meals -Continue hectoral with HD    ESRD - Pt on TTS schedule at [facility]. Received HD on 3/7, 3/10, 3/12, 3/14, 3/17, 3/19, 3/21   -Renal diet -Nephrology following, on TTS schedule  -Continue nepro supplements and and fosrenol 4 TID with meals  -Hectoral with HD, to start ESA weekly on 3/24 -Monitor daily weight and volume status   Paroxysmal Atrial Fibrillation - currently NSR and normal HR. CHADS score of 1 (HTN) on daily AP therapy. Per [facility] records he was previously on diltiazem ER 120 mg daily and for a period of time on [facility]mad[facility]. -Continue 81 mg aspirin daily  -Continue PO diltiazem CD 120 mg daily  -Monitor on telemetry   Hypertension - Currently hypertensive -Continue PO diltiazem CD 120 mg daily   Chronic Microcytic Anemia - Pt with Hg near baseline of 10. Etiology likely due to CKD. Pt receives ESA and erythropoetin weekly at HD. Anemia panel on 3/19 revealed ferritin (1021 H), iron 121, TIBC 338, iron sat 36% most likely consistent with ACD.  -Monitor for bleeding  -Transfuse if Hg <7  -To restart ESA weekly on 3/24  Chronic Liver Cirrhosis - stable.  Pt with liver function testing on admission that appears at baseline. No active bleeding. Etiology most likely due to chronic hepatitis C infection.  -Continue lactulose 10g TID  -Continue hydroxyzine 25 mg Q 6hr PRN pruritis  -Continue folic acid 1 mg daily  -Phenergan PRN nausea (avoid zofran in setting of prolonged QT)   Severe Malnutrition - Pt with chronic hypoalbuminemia in setting of ESRD and liver cirrhosis. Pt with  evidence of severe muscle wasting and 8% weight loss in last month with BMI of 20.12  -Renal diet -Double trays and snacks  -Nepro Shake po TID  -Monitor weight    Social Issues - Currently homeless, was living at extended stay hotel. Pt unable to afford any of his medications, however reported he would receive VA check later this month (spent on cocaine last month).  [facility] but did not attend. [place].  -CM/Psych SW following  -[facility] [place], or [facility]  Diet: Renal  DVT Ppx: SQ heparin TID  Code: Full Dispo: [facility] [place] [place] [facility]  The patient does  not have a current PCP (No Pcp Per Patient) and does need an [facility] hospital follow-up appointment after discharge.  The patient does have transportation limitations that hinder transportation to clinic appointments.  .Services Needed at time of discharge: Y = Yes, Blank = No PT: No  OT: No  RN: No  Equipment: None  Other: None    LOS: 16 days   Otis Brace, MD 01/30/2014, 8:37 AM

## 2014-01-30 NOTE — Progress Notes (Signed)
MD at bedside gave pt verbal permission to go onto porch area outside of solarium, limited to 15 minute intervals with staff member present. Contingent upon patient behavior. Will continue to monitor.

## 2014-01-30 NOTE — ED Notes (Signed)
Spoke with Tresa Endo, [facility] at [facility] who confirmed with Dr. Elsie Saas that he never accepted pt for admission at [facility].  Tresa Endo explained that pt was on [facility] waiting list in error, without confirmation of a receiving MD.  Tresa Endo recommeds psych c/s in am to see if he still needs IP tx. Charge RN informed.  CSW will update Psych service line CSW for f/u in am.

## 2014-01-30 NOTE — Clinical Social Work Note (Signed)
I have spoken with staff at [facility] today and they indicate patient is on the waiting list for admission there- they are hopeful for some beds to open up in the next 24 hours- will update as progress is made.  Reece Levy, MSW, [facility] Majors [phone]

## 2014-01-30 NOTE — Progress Notes (Signed)
NUTRITION FOLLOW UP  DOCUMENTATION CODES  Per approved criteria   -Severe malnutrition in the context of chronic illness    Pt meets criteria for severe MALNUTRITION in the context of chronic illness as evidenced by severe muscle wasting and 11% wt loss x <2 months.  Intervention:   Continue Nepro Shake PO BID.  Provide after dinner snack/dessert--will order.  Continue to follow nutrition care plan.  Nutrition Dx:   Malnutrition related to chronic illness as evidenced by severe muscle wasting and 12% weight loss x 1 month, Ongoing  Goal:   Pt to meet >/= 90% of their estimated nutrition needs, met  Monitor:   PO intake, weight trend, labs, supplement acceptance  Assessment:   Carl Benson is a 58 year old Veteran with past medical history of ESRD on TTS HD, COPD, HTN, decompensated liver cirrhosis, and afib (not on coumadin) who presents with depression. The patient was recently hospitalized and left AMA. Pt admitted 3/7 with desire to "be committed to the mental health ward at the [facility] Pt admitted to being suicidal for the past 6 months and binged on crack cocaine.  3/16-Ordered for a Regular diet with double portions and low potassium restriction. Pt has been eating >50% of most meals. Tells the team that he has been gaining weight because he has been eating well. Pt has been seen by psych, with recommendations for acute psychiatric hospitalization. Per case manager note on 3/13, pt unable to go to [facility] 2/2 HD status.   3/23-Pt on a renal diet with 1200 ml fluids. Meal completion 75-100%.Pt reports having a good appetite and eating most of his meals. Pt reports he can not eat a lot at one sitting, but will eat as much as he can. Pt has been drinking Nepro. Pt reports not liking his renal diet. Pt asked questions on why he was switched to a renal diet after being on a regular diet. Pt was educated that his current renal labs are elevated and the renal diet will help prevent  further lab elevations for pt safety and health. Pt verbalized understanding. Pt request wanting a snack/dessert item after his dinner-- will order renal appropriate food item. Pt denies any stomach pains, nausea, or difficulty chewing or swallowing.  Per MD note-Suicidal ideation/PTSD/depression - awaiting transfer for inpatient care when bed available as pt currently medically stable.  Renal labs: Low sodium. High potassium, and phosphorus  Height: Ht Readings from Last 1 Encounters:  01/24/14 $RemoveB'5\' 5"'nNBpGdtu$  (1.651 m)    Weight Status:   Wt Readings from Last 1 Encounters:  01/29/14 119 lb 8 oz (54.205 kg)  Admit wt (01/14/14) 111 lb  Body mass index is 19.89 kg/(m^2). Normal  Re-estimated needs:  Kcal: 1900-2100  Protein: 75-85 grams  Fluid: 1.2 L/day  Skin: no issues noted  Diet Order: Renal with 1200 ml fluids   Intake/Output Summary (Last 24 hours) at 01/30/14 1408 Last data filed at 01/30/14 1000  Gross per 24 hour  Intake    483 ml  Output      0 ml  Net    483 ml    Last BM: 3/20   Labs:   Recent Labs Lab 01/24/14 0720 01/27/14 0422 01/30/14 0558  NA 131* 134* 135*  K 5.9* 5.8* 6.0*  CL 87* 92* 91*  CO2 $Re'22 25 26  'qlE$ BUN 88* 45* 55*  CREATININE 6.74* 4.17* 5.00*  CALCIUM 10.2 9.4 10.2  PHOS 7.6* 6.7* 5.1*  GLUCOSE 112* 92 99  CBG (last 3)  No results found for this basename: GLUCAP,  in the last 72 hours  Scheduled Meds: . aspirin  81 mg Oral Daily  . [START ON 01/31/2014] darbepoetin (ARANESP) injection - DIALYSIS  25 mcg Intravenous Q Tue-HD  . diltiazem  120 mg Oral Daily  . doxercalciferol  2 mcg Intravenous Q T,Th,Sa-HD  . feeding supplement (NEPRO CARB STEADY)  237 mL Oral BID BM  . [START ON 01/31/2014] ferric gluconate (FERRLECIT/NULECIT) IV  62.5 mg Intravenous Q Tue-HD  . folic acid  1 mg Oral Daily  . heparin  3,000 Units Dialysis Once in dialysis  . heparin  5,000 Units Subcutaneous 3 times per day  . lactulose  10 g Oral TID  .  lanthanum  1,000 mg Oral TID WC  . QUEtiapine  50 mg Oral QHS  . sertraline  25 mg Oral Daily  . sodium chloride  3 mL Intravenous Q12H  . tiotropium  18 mcg Inhalation Daily  . traZODone  100 mg Oral QHS    Continuous Infusions:   Kallie Locks Dietetic Intern Pager: [phone]    I agree with the above information and made appropriate revisions. Inda Coke MS, RD, LDN Inpatient Registered Dietitian Pager: [phone] After-hours pager: [phone]

## 2014-01-30 NOTE — ED Notes (Signed)
Psych CSW informed of issues with [facility] tx this pm and plan for pt to contact his son/ex-wife to assist after d/c when cleared by psych ? 01/31/14.  Pt was initially angry when told he would not be going to [facility], but eventually was able to look past it and look forward to his pending d/c. Pt was smiling and joking with staff at nurse's station and denied suicidal ideations when asked about this by CSW.  Emotional support offered.

## 2014-01-30 NOTE — Progress Notes (Signed)
  Date: 01/30/2014  Patient name: Carl Benson  Medical record number: [id number]  Date of birth: [birth year]   This patient has been seen and the plan of care was discussed with the house staff. Please see their note for complete details. I concur with their findings with the following additions/corrections: Medically stable. No complaints. Pending tx to inpatient psychiatric facility.  Jonah Blue, DO, FACP Faculty [facility] Res[facility] Program 01/30/2014, 3:44 PM

## 2014-01-30 NOTE — Progress Notes (Signed)
Per Dr. Elsie Saas MD patient was never accepted to [facility]. Communicated to Social Work. Rosey Bath, RN

## 2014-01-30 NOTE — Discharge Summary (Signed)
  Date: 01/30/2014  Patient name: Carl Benson  Medical record number: [id number]  Date of birth: 04-Jul-1956   This patient has been seen and the plan of care was discussed with the house staff. Please see their note for complete details. I concur with their findings and plan.  Jonah Blue, DO, FACP Faculty [facility] Internal Medicine Residency Program 01/30/2014, 3:51 PM

## 2014-01-30 NOTE — Clinical Social Work Note (Signed)
Patient has been accepted at [facility]- I have advised the MD and will await final word from [facility] as they have indicated they cannot accept until after 6pm. CSW has asked ED evening CSW to follow up for final transfer-  Reece Levy, MSW, [employer] [phone]

## 2014-01-31 LAB — RENAL FUNCTION PANEL
Albumin: 3 g/dL — ABNORMAL LOW (ref 3.5–5.2)
BUN: 81 mg/dL — AB (ref 6–23)
CALCIUM: 10.3 mg/dL (ref 8.4–10.5)
CHLORIDE: 85 meq/L — AB (ref 96–112)
CO2: 26 meq/L (ref 19–32)
Creatinine, Ser: 6.41 mg/dL — ABNORMAL HIGH (ref 0.50–1.35)
GFR calc Af Amer: 10 mL/min — ABNORMAL LOW (ref >90)
GFR calc non Af Amer: 9 mL/min — ABNORMAL LOW (ref >90)
GLUCOSE: 128 mg/dL — AB (ref 70–99)
Phosphorus: 7.1 mg/dL — ABNORMAL HIGH (ref 2.3–4.6)
Potassium: 6.6 mEq/L (ref 3.7–5.3)
Sodium: 132 mEq/L — ABNORMAL LOW (ref 137–147)

## 2014-01-31 LAB — CBC
HEMATOCRIT: 27.3 % — AB (ref 39.0–52.0)
Hemoglobin: 9.5 g/dL — ABNORMAL LOW (ref 13.0–17.0)
MCH: 26.1 pg (ref 26.0–34.0)
MCHC: 34.8 g/dL (ref 30.0–36.0)
MCV: 75 fL — ABNORMAL LOW (ref 78.0–100.0)
Platelets: 124 10*3/uL — ABNORMAL LOW (ref 150–400)
RBC: 3.64 MIL/uL — ABNORMAL LOW (ref 4.22–5.81)
RDW: 18.8 % — ABNORMAL HIGH (ref 11.5–15.5)
WBC: 8.9 10*3/uL (ref 4.0–10.5)

## 2014-01-31 LAB — BASIC METABOLIC PANEL
BUN: 20 mg/dL (ref 6–23)
CO2: 28 mEq/L (ref 19–32)
Calcium: 9.7 mg/dL (ref 8.4–10.5)
Chloride: 94 mEq/L — ABNORMAL LOW (ref 96–112)
Creatinine, Ser: 2.12 mg/dL — ABNORMAL HIGH (ref 0.50–1.35)
GFR calc Af Amer: 38 mL/min — ABNORMAL LOW (ref >90)
GFR calc non Af Amer: 32 mL/min — ABNORMAL LOW (ref >90)
Glucose, Bld: 101 mg/dL — ABNORMAL HIGH (ref 70–99)
Potassium: 4.1 mEq/L (ref 3.7–5.3)
Sodium: 138 mEq/L (ref 137–147)

## 2014-01-31 MED ORDER — DOXERCALCIFEROL 4 MCG/2ML IV SOLN
INTRAVENOUS | Status: AC
  Filled 2014-01-31: qty 2

## 2014-01-31 MED ORDER — DARBEPOETIN ALFA-POLYSORBATE 25 MCG/0.42ML IJ SOLN
INTRAMUSCULAR | Status: AC
  Filled 2014-01-31: qty 0.42

## 2014-01-31 MED ORDER — HYDROXYZINE HCL 25 MG PO TABS
ORAL_TABLET | ORAL | Status: AC
  Filled 2014-01-31: qty 1

## 2014-01-31 MED ORDER — PENTAFLUOROPROP-TETRAFLUOROETH EX AERO
INHALATION_SPRAY | CUTANEOUS | Status: AC
  Administered 2014-01-31: 14:00:00
  Filled 2014-01-31: qty 103.5

## 2014-01-31 NOTE — Clinical Social Work Note (Signed)
Per evening CSW, patient was apparently placed on the waiting list in error, so their bed offer has been rescinded. Psych will need to be re-consulted for f/u assessment and d/c determination- Will contact MD this morning to advise of this need-  Reece Levy, MSW, Theresia Majors [phone]

## 2014-01-31 NOTE — Progress Notes (Signed)
  Date: 01/31/2014  Patient name: Carl Benson  Medical record number: [id number]  Date of birth: 01/06/1956   This patient has been seen and the plan of care was discussed with the house staff. Please see their note for complete details. I concur with their findings with the following additions/corrections: Medically stable. Placement is still an issue. Patient was very upset this morning. He states if no answers today he will try to leave. We need psych input today.   Jonah Blue, DO, FACP Faculty [facility] Medicine Residency Program 01/31/2014, 11:13 AM

## 2014-01-31 NOTE — Progress Notes (Signed)
Subjective:  Pt seen and examined in AM. Yesterday it was thought that there was a bed available at [facility] however later turned out that no approving physician had accepted him. Pt was initially upset and felt that this was not fair, but able to see past it. I contacted his son who said he is working on finding him living arrangements but needs more time.    Pt denies any current complaints. He would like to know if he stills needs to be in the hospital or not.    Objective: Vital signs in last 24 hours: Filed Vitals:   01/30/14 1223 01/30/14 2148 01/31/14 0615 01/31/14 0909  BP: 144/89 173/105 144/95 152/95  Pulse: 81 97 87 90  Temp: 97.9 F (36.6 C) 97.7 F (36.5 C) 98.1 F (36.7 C) 97.7 F (36.5 C)  TempSrc: Oral Oral Oral Oral  Resp: 18 18 20 20   Height:  5\' 5"  (1.651 m)    Weight:  55.65 kg (122 lb 11 oz)    SpO2: 100% 94% 95% 96%   Weight change: 1.445 kg (3 lb 3 oz)  Intake/Output Summary (Last 24 hours) at 01/31/14 1009 Last data filed at 01/30/14 1855  Gross per 24 hour  Intake    240 ml  Output      0 ml  Net    240 ml   Physical Exam:  General appearance:  Malnourished Resp: Clear to ausculation bilaterally with no wheezing, rhonchi, or rales Cardio: Normal rate and regular rhythm   GI: Normal BS , soft and non-tender, mild distension  Extremities: no edema Psych: cooperative  Lab Results: Basic Metabolic Panel:  Recent Labs Lab 01/27/14 0422 01/30/14 0558  NA 134* 135*  K 5.8* 6.0*  CL 92* 91*  CO2 25 26  GLUCOSE 92 99  BUN 45* 55*  CREATININE 4.17* 5.00*  CALCIUM 9.4 10.2  PHOS 6.7* 5.1*   Liver Function Tests:  Recent Labs Lab 01/27/14 0422 01/30/14 0558  ALBUMIN 2.8* 3.0*    Studies/Results: No results found. Medications: I have reviewed the patient's current medications. Scheduled Meds: . aspirin  81 mg Oral Daily  . darbepoetin (ARANESP) injection - DIALYSIS  25 mcg Intravenous Q Tue-HD  . diltiazem  120 mg Oral Daily  .  doxercalciferol  2 mcg Intravenous Q T,Th,Sa-HD  . feeding supplement (NEPRO CARB STEADY)  237 mL Oral BID BM  . ferric gluconate (FERRLECIT/NULECIT) IV  62.5 mg Intravenous Q Tue-HD  . folic acid  1 mg Oral Daily  . heparin  3,000 Units Dialysis Once in dialysis  . heparin  5,000 Units Subcutaneous 3 times per day  . lactulose  10 g Oral TID  . lanthanum  1,000 mg Oral TID WC  . QUEtiapine  50 mg Oral QHS  . sertraline  25 mg Oral Daily  . sodium chloride  3 mL Intravenous Q12H  . tiotropium  18 mcg Inhalation Daily  . traZODone  100 mg Oral QHS   Continuous Infusions:  PRN Meds:.sodium chloride, sodium chloride, sodium chloride, acetaminophen, acetaminophen, albuterol, benzonatate, camphor-menthol, docusate sodium, guaiFENesin, hydrOXYzine, ondansetron (ZOFRAN) IV, ondansetron, sorbitol Assessment/Plan:  Assessment: 58 year old with ESRD on HD, decompensated liver cirrhosis, COPD, HTN, and AFib (not on coumadin), and PTSD with multiple ED admissions in the past 3 months who presented on 3/7 with active suicidal thoughts.   Plan:   Recurrent Severe Major Depression and PTSD with Suicidal Ideation - currently medically stable for transfer. Pt placed under IVC  in ED on admission and later renewed on 3/17. Pt previously hospitalized from 2/27- 3/4 and started on sertraline 25 mg daily and quetiapine 50 mg daily. Pt was to follow up with outpatient psychiatry in [facility] on 3/6 but did not. Pt with reported binge on crack cocaine two days prior to admission.   -Appreciate Psychiatry consult  -Most recent psychiatric recommendations is to undergo acute psychiatric hospitalization for crisis stabilization, safety monitoring and medication management -Continue sertraline 25 mg daily for depression and anxiety  -Continue quetiapine 50 mg daily for agitation     -Continue trazadone 50 mg daily at bedtime for insomnia -Continue safety sitter at bedside, ambulation from time to time is ok  with psychiatry if medical condition permits  -Awaiting availability of bed (psych or medicine) from [place] in [place], [place], or [facility]. Contact for [facility] [place] is Blane Ohara [phone] ext [phone]  -Psychiatry to re-evaulate pt later today to determine disposition, pt now in contact with son who may be able to provide assistance   Hyperkalemia -  Most likely due to recent dietary indiscretion in setting of ESRD. -HD TTS -Monitor for chest pain  -Evaluate for access issues  Hyperphosphatemia in setting of Secondary Hyperparathyroidism -  Etiology most likely secondary due to ESRD. PTH level on 3/20 was high (86).   -Continue fosrenol 1000 mg TID with meals -Continue hectoral with HD    ESRD - Pt on TTS schedule at [facility]. Received HD on 3/7, 3/10, 3/12, 3/14, 3/17, 3/19, 3/21   -Renal diet -Nephrology following, on TTS schedule  -Continue nepro supplements and and fosrenol 4 TID with meals  -Hectoral with HD, to start ESA weekly on 3/24 -Monitor daily weight and volume status   Paroxysmal Atrial Fibrillation - currently NSR and normal HR. CHADS score of 1 (HTN) on daily AP therapy. Per [facility] records he was previously on diltiazem ER 120 mg daily and for a period of time on coumadin for [facility] therapy. -Continue 81 mg aspirin daily  -Continue PO diltiazem CD 120 mg daily  -Monitor on telemetry   Hypertension - Currently hypertensive -Continue PO diltiazem CD 120 mg daily   Chronic Microcytic Anemia - Pt with Hg near baseline of 10. Etiology likely due to CKD. Pt receives ESA and erythropoetin weekly at HD. Anemia panel on 3/19 revealed ferritin (1021 H), iron 121, TIBC 338, iron sat 36% most likely consistent with ACD.  -Monitor for bleeding  -Transfuse if Hg <7  -To restart ESA weekly with HD today  Chronic Liver Cirrhosis - stable.  Pt with liver function testing on admission that appears at baseline. No active bleeding. Etiology most likely due to chronic hepatitis C infection.    -Continue lactulose 10g TID  -Continue hydroxyzine 25 mg Q 6hr PRN pruritis  -Continue folic acid 1 mg daily  -Phenergan PRN nausea (avoid zofran in setting of prolonged QT)   Severe Malnutrition - Pt with chronic hypoalbuminemia in setting of ESRD and liver cirrhosis. Pt with evidence of severe muscle wasting and 8% weight loss in last month with BMI of 20.12  -Renal diet -Double trays and snacks  -Nepro Shake po TID  -Monitor weight    Social Issues - Currently homeless (was living at extended stay hotel). Pt unable to afford any of his medications, however reported he would receive [facility] check later this month (spent on cocaine last month).  Pt was to [facility]up with [facility] on March 6 but did  not attend. [place]  -CM/Psych SW following  -Pt now in contact with son  Diet: Renal  DVT Ppx: SQ heparin TID  Code: Full Dispo: Awaiting Psychiatry Re-evaluation  The patient does not have a current PCP (No Pcp Per Patient) and does [facility] after discharge.  The patient does have transportation limitations that hinder transportation to clinic appointments.  .Services Needed at time of discharge: Y = Yes, Blank = No PT: No  OT: No  RN: No  Equipment: None  Other: None    LOS: 17 days   Otis BraceMarjan Ab Leaming, MD 01/31/2014, 10:09 AM

## 2014-01-31 NOTE — Progress Notes (Signed)
[facility] Progress Note  Subjective:   No c/o  Objective Filed Vitals:   01/30/14 1223 01/30/14 2148 01/31/14 0615 01/31/14 0909  BP: 144/89 173/105 144/95 152/95  Pulse: 81 97 87 90  Temp: 97.9 F (36.6 C) 97.7 F (36.5 C) 98.1 F (36.7 C) 97.7 F (36.5 C)  TempSrc: Oral Oral Oral Oral  Resp: 18 18 20 20   Height:  5\' 5"  (1.651 m)    Weight:  55.65 kg (122 lb 11 oz)    SpO2: 100% 94% 95% 96%   Physical Exam being cannulated - first try - clot; then successful cannulation General: NAD Heart: RRR Lungs: crackles right base Abdomen: somewhat distended + ascites Extremities: 1+ LE edema Dialysis Access: right upper AVGG + bruit   Dialysis: TTS [facility]  4h New dry wt lower ?? (was 55.5 kg) 2/2.25 [place] Heparin 3000 RUA AVG  Hect 2 EPO 2800 Venofer 50/wk   Assessment/Plan:  1 Suicidal ideation/PTSD/depression - awaiting transfer for inpatient care when bed available plans still in question ;adm here since 01/14/2014  2 ESRD TTS -K 5.8 on Friday k not done pre HD Saturday - recheck Monday K was 6 - kayexalate 15 given; due for HD and recheck today - there is some concern that ^ K bay be related to some problems with graft function vs diet 3 HTN/volume- am bp 163/96 last hd sat 3/21= per pt request UF 3.5 with post weight 51.2 - He requests 3 l off per note from yesterday; I think he probably needs more off - follow with crit line 4 Cirrhosis with mild ascites - stable /on tid lactulose  5 PAF- in sinus rhythm, po dilt 120/d + asa  6 Anemia Hgb 9.7 -without esa - restarted esa 3/24 added  7 MBD Last pth 1/201= 508 changed phoslo to fosrenol ^ ca; hectorol 2  8 Nutrition - Now on renal diet sec. To Kirtland Bouchard and Loleta Dicker; mutivit and nepro  Sheffield Slider, PA-C  [facility] [phone] 01/31/2014,10:33 AM  LOS: 17 days   Pt seen, examined, agree w assess/plan as above. [name] MD pager [phone]    cell [phone] 01/31/2014, 11:36  AM      Additional Objective Labs: Basic Metabolic Panel:  Recent Labs Lab 01/27/14 0422 01/30/14 0558  NA 134* 135*  K 5.8* 6.0*  CL 92* 91*  CO2 25 26  GLUCOSE 92 99  BUN 45* 55*  CREATININE 4.17* 5.00*  CALCIUM 9.4 10.2  PHOS 6.7* 5.1*   Liver Function Tests:  Recent Labs Lab 01/27/14 0422 01/30/14 0558  ALBUMIN 2.8* 3.0*   Medications:   . aspirin  81 mg Oral Daily  . darbepoetin (ARANESP) injection - DIALYSIS  25 mcg Intravenous Q Tue-HD  . diltiazem  120 mg Oral Daily  . doxercalciferol  2 mcg Intravenous Q T,Th,Sa-HD  . feeding supplement (NEPRO CARB STEADY)  237 mL Oral BID BM  . ferric gluconate (FERRLECIT/NULECIT) IV  62.5 mg Intravenous Q Tue-HD  . folic acid  1 mg Oral Daily  . heparin  3,000 Units Dialysis Once in dialysis  . heparin  5,000 Units Subcutaneous 3 times per day  . lactulose  10 g Oral TID  . lanthanum  1,000 mg Oral TID WC  . QUEtiapine  50 mg Oral QHS  . sertraline  25 mg Oral Daily  . sodium chloride  3 mL Intravenous Q12H  . tiotropium  18 mcg Inhalation Daily  . traZODone  100 mg Oral QHS

## 2014-01-31 NOTE — Consult Note (Signed)
Reason for Consult: depression and ptsd and suicidal thoughts Referring Physician: Dr. [name] is an 58 y.o. male.  HPI: Patient was seen for psychiatric consultation followup as requested by primary team. Patient has been compliant with his psychiatric medication and hemodialysis as scheduled for the last one week. Patient stated he has been feeling much better without symptoms of depression and an anxiety. Patient has made contact with his oldest son who has been supportive him and will be providing appropriate living arrangement for him closely to his home which is making patient happy and excited. Patient has regrets about his past behavior problems and also substance abuse relapse. Patient has been suffering with Maj. depressive disorder, recurrent and severe, posttraumatic stress disorder and substance abuse. Patient is Willing to follow up  psychiatric [facility] in [place]. Patient needed scheduled appointment and physical address of the clinic as he was new to [place] Status Examination: Patient  is calm, quite and cooperative and pleasant during this evaluation. Patient has  normal psychomotor activity and has good eye contact. Patient has good mood and his affect was appropriate and congruent. He has normal rate, rhythm, and volume of speech. His thought process is linear and goal directed. Patient has suicidal ideations without plan, and denied homicidal ideations, intentions or plans. Patient has no evidence of auditory or visual hallucinations, delusions, and paranoia. Patient has fair insight judgment and impulse control.  Past Medical History  Diagnosis Date  . COPD (chronic obstructive pulmonary disease)   . Hypertension   . Hep C w/ coma, chronic   . Irregular heartbeat   . ESRD (end stage renal disease) on dialysis     /notes 11/11/2013  . Smoker unmotivated to quit   . Active smoker   . PTSD (post-traumatic stress disorder)   . Shortness of  breath     Past Surgical History  Procedure Laterality Date  . Paracentesis  ~ 10/2013    Archie Endo 11/11/2013    Family History  Problem Relation Age of Onset  . Heart disease Mother   . Hypertension Mother   . Diabetes Mother     Social History:  reports that he has been smoking Cigarettes.  He has a 7.5 pack-year smoking history. He has never used smokeless tobacco. He reports that he uses illicit drugs ("Crack" cocaine, Cocaine, Marijuana, and Other-see comments). He reports that he does not drink alcohol.  Allergies: No Known Allergies  Medications: I have reviewed the patient's current medications.  Results for orders placed during the hospital encounter of 01/14/14 (from the past 48 hour(s))  RENAL FUNCTION PANEL     Status: Abnormal   Collection Time    01/30/14  5:58 AM      Result Value Ref Range   Sodium 135 (*) 137 - 147 mEq/L   Potassium 6.0 (*) 3.7 - 5.3 mEq/L   Chloride 91 (*) 96 - 112 mEq/L   CO2 26  19 - 32 mEq/L   Glucose, Bld 99  70 - 99 mg/dL   BUN 55 (*) 6 - 23 mg/dL   Creatinine, Ser 5.00 (*) 0.50 - 1.35 mg/dL   Calcium 10.2  8.4 - 10.5 mg/dL   Phosphorus 5.1 (*) 2.3 - 4.6 mg/dL   Albumin 3.0 (*) 3.5 - 5.2 g/dL   GFR calc non Af Amer 11 (*) >90 mL/min   GFR calc Af Amer 13 (*) >90 mL/min   Comment: (NOTE)     The eGFR  has been calculated using the CKD EPI equation.     This calculation has not been validated in all clinical situations.     eGFR's persistently <90 mL/min signify possible Chronic Kidney     Disease.  CBC     Status: Abnormal   Collection Time    01/31/14 11:17 AM      Result Value Ref Range   WBC 8.9  4.0 - 10.5 K/uL   RBC 3.64 (*) 4.22 - 5.81 MIL/uL   Hemoglobin 9.5 (*) 13.0 - 17.0 g/dL   HCT 27.3 (*) 39.0 - 52.0 %   MCV 75.0 (*) 78.0 - 100.0 fL   MCH 26.1  26.0 - 34.0 pg   MCHC 34.8  30.0 - 36.0 g/dL   RDW 18.8 (*) 11.5 - 15.5 %   Platelets 124 (*) 150 - 400 K/uL  RENAL FUNCTION PANEL     Status: Abnormal   Collection Time     01/31/14 11:17 AM      Result Value Ref Range   Sodium 132 (*) 137 - 147 mEq/L   Potassium 6.6 (*) 3.7 - 5.3 mEq/L   Comment: CRITICAL RESULT CALLED TO, READ BACK BY AND VERIFIED WITH:     S.COLE RN 01/31/14 1207 E.GADDY   Chloride 85 (*) 96 - 112 mEq/L   CO2 26  19 - 32 mEq/L   Glucose, Bld 128 (*) 70 - 99 mg/dL   BUN 81 (*) 6 - 23 mg/dL   Creatinine, Ser 6.41 (*) 0.50 - 1.35 mg/dL   Calcium 10.3  8.4 - 10.5 mg/dL   Phosphorus 7.1 (*) 2.3 - 4.6 mg/dL   Albumin 3.0 (*) 3.5 - 5.2 g/dL   GFR calc non Af Amer 9 (*) >90 mL/min   GFR calc Af Amer 10 (*) >90 mL/min   Comment: (NOTE)     The eGFR has been calculated using the CKD EPI equation.     This calculation has not been validated in all clinical situations.     eGFR's persistently <90 mL/min signify possible Chronic Kidney     Disease.  BASIC METABOLIC PANEL     Status: Abnormal   Collection Time    01/31/14  2:40 PM      Result Value Ref Range   Sodium 138  137 - 147 mEq/L   Potassium 4.1  3.7 - 5.3 mEq/L   Comment: DELTA CHECK NOTED   Chloride 94 (*) 96 - 112 mEq/L   Comment: DELTA CHECK NOTED   CO2 28  19 - 32 mEq/L   Glucose, Bld 101 (*) 70 - 99 mg/dL   BUN 20  6 - 23 mg/dL   Comment: DELTA CHECK NOTED   Creatinine, Ser 2.12 (*) 0.50 - 1.35 mg/dL   Comment: DELTA CHECK NOTED   Calcium 9.7  8.4 - 10.5 mg/dL   GFR calc non Af Amer 32 (*) >90 mL/min   GFR calc Af Amer 38 (*) >90 mL/min   Comment: (NOTE)     The eGFR has been calculated using the CKD EPI equation.     This calculation has not been validated in all clinical situations.     eGFR's persistently <90 mL/min signify possible Chronic Kidney     Disease.    No results found.  Positive for anxiety, bad mood, depression, illegal drug usage, mood swings and sleep disturbance Blood pressure 157/100, pulse 94, temperature 98.6 F (37 C), temperature source Oral, resp. rate 18, height $RemoveBe'5\' 5"'[id number]$  (1.651  m), weight 51.5 kg (113 lb 8.6 oz), SpO2  96.00%.   Assessment/Plan: Maj. depressive disorder, recurrent Posttraumatic stress disorder Cocaine abuse versus dependence  Recommendation:  1. Rescind Involuntary commitment as patient has no more safety concerns  2. [facility] [place] as patient requested because he has 100% disability benefits  3. Continue Zoloft 25 mg daily for depression and anxiety,   4. Seroquel 50 mg at bedtime for agitation and trazodone 100 mg at bedtime for insomnia   5. Discontinue safety sitter   6. Appreciate psychiatric consultation and followup as clinically required  Tayvin Preslar,JANARDHAHA R. 01/31/2014, 5:12 PM

## 2014-02-01 LAB — BASIC METABOLIC PANEL
BUN: 33 mg/dL — ABNORMAL HIGH (ref 6–23)
CO2: 28 mEq/L (ref 19–32)
Calcium: 9.6 mg/dL (ref 8.4–10.5)
Chloride: 94 mEq/L — ABNORMAL LOW (ref 96–112)
Creatinine, Ser: 3.7 mg/dL — ABNORMAL HIGH (ref 0.50–1.35)
GFR calc Af Amer: 19 mL/min — ABNORMAL LOW (ref >90)
GFR calc non Af Amer: 17 mL/min — ABNORMAL LOW (ref >90)
Glucose, Bld: 81 mg/dL (ref 70–99)
Potassium: 4.5 mEq/L (ref 3.7–5.3)
Sodium: 138 mEq/L (ref 137–147)

## 2014-02-01 MED ORDER — DILTIAZEM HCL ER COATED BEADS 120 MG PO CP24: 120.0000 mg | ORAL_CAPSULE | Freq: Every day | ORAL | Status: DC

## 2014-02-01 MED ORDER — ALBUTEROL SULFATE HFA 108 (90 BASE) MCG/ACT IN AERS
2.0000 | INHALATION_SPRAY | Freq: Four times a day (QID) | RESPIRATORY_TRACT | Status: DC | PRN
Start: 2014-02-01 — End: 2014-02-01

## 2014-02-01 MED ORDER — HYDROXYZINE HCL 25 MG PO TABS: 25.0000 mg | ORAL_TABLET | Freq: Three times a day (TID) | ORAL | Status: DC | PRN

## 2014-02-01 MED ORDER — SERTRALINE HCL 25 MG PO TABS: 25.0000 mg | ORAL_TABLET | Freq: Every day | ORAL | Status: DC

## 2014-02-01 MED ORDER — CAMPHOR-MENTHOL 0.5-0.5 % EX LOTN: 1.0000 "application " | TOPICAL_LOTION | Freq: Three times a day (TID) | CUTANEOUS | Status: DC | PRN

## 2014-02-01 MED ORDER — TIOTROPIUM BROMIDE MONOHYDRATE 18 MCG IN CAPS: 18.0000 ug | ORAL_CAPSULE | Freq: Every day | RESPIRATORY_TRACT | Status: DC

## 2014-02-01 MED ORDER — LACTULOSE 10 GM/15ML PO SOLN: 10.0000 g | Freq: Three times a day (TID) | ORAL | Status: DC

## 2014-02-01 MED ORDER — LANTHANUM CARBONATE 1000 MG PO CHEW: 1000.0000 mg | CHEWABLE_TABLET | Freq: Three times a day (TID) | ORAL | Status: DC

## 2014-02-01 MED ORDER — ALBUTEROL SULFATE (2.5 MG/3ML) 0.083% IN NEBU: 2.5000 mg | INHALATION_SOLUTION | Freq: Four times a day (QID) | RESPIRATORY_TRACT | Status: DC | PRN

## 2014-02-01 MED ORDER — NEPRO/CARBSTEADY PO LIQD: 237.0000 mL | Freq: Two times a day (BID) | ORAL | Status: DC

## 2014-02-01 MED ORDER — ALBUTEROL SULFATE HFA 108 (90 BASE) MCG/ACT IN AERS: 2.0000 | INHALATION_SPRAY | Freq: Four times a day (QID) | RESPIRATORY_TRACT | Status: DC | PRN

## 2014-02-01 MED ORDER — ASPIRIN 81 MG PO CHEW: 81.0000 mg | CHEWABLE_TABLET | Freq: Every day | ORAL | Status: DC

## 2014-02-01 MED ORDER — FOLIC ACID 1 MG PO TABS: 1.0000 mg | ORAL_TABLET | Freq: Every day | ORAL | Status: DC

## 2014-02-01 MED ORDER — ALBUTEROL SULFATE HFA 108 (90 BASE) MCG/ACT IN AERS
2.0000 | INHALATION_SPRAY | Freq: Four times a day (QID) | RESPIRATORY_TRACT | Status: DC | PRN
  Filled 2014-02-01: qty 6.7

## 2014-02-01 MED ORDER — QUETIAPINE FUMARATE 50 MG PO TABS: 50.0000 mg | ORAL_TABLET | Freq: Every day | ORAL | Status: DC

## 2014-02-01 MED ORDER — TRAZODONE HCL 100 MG PO TABS: 100.0000 mg | ORAL_TABLET | Freq: Every day | ORAL | Status: DC

## 2014-02-01 NOTE — Discharge Instructions (Signed)
-  Take Zoloft, Trazodone, and Seroquel daily for your mood -Take aspirin and diltiazem daily for your blood pressure and atrial fibrillation -Take Lanthanum three times daily with meals to lower your phosphate -Take lactulose three times daily for your liver disease (it will make you have bowel movement) -Take Spiriva daily for your COPD and use albuterol inhaler only as needed for shortness of breath and wheezing -Attend your dialysis on Tue, Thurs, and Sat at [facility], Address is  [address], [place], [place] [address], phone number is [phone] -Follow up with Dr. Leonie Douglas On 02/15/2014. (9:00AM ) Contact information: [address] [place], [place] [address], phone number is [phone] -There is also a behavioral health center at [facility] you can go to if you need help - [address] [place] [address], [phone]

## 2014-02-01 NOTE — Progress Notes (Addendum)
Subjective:  Pt seen and examined in AM. No acute events overnight. Patient cleared by psychiatrist yesterday. He is currently not suicidal and reports feeling great with stable mood. I spoke with his son over the phone who will be coming today to pick up and take [facility] [place] to get him into the system. He is also looking into assisted living facility for him.   He later arrived at bedside with his wife and I explained discharge instructions to them. They will be sure to fill the written prescriptions given, help with transportation for him to attend HD [facility], as well as [facility].       Objective: Vital signs in last 24 hours: Filed Vitals:   01/31/14 1510 01/31/14 2339 02/01/14 0504 02/01/14 0733  BP: 157/100 125/78 126/80   Pulse: 94 88 86   Temp: 98.6 F (37 C) 98.5 F (36.9 C) 98.6 F (37 C)   TempSrc: Oral Oral Oral   Resp: 18 16 17    Height:      Weight: 51.5 kg (113 lb 8.6 oz)     SpO2:  98% 97% 98%   Weight change: 0.05 kg (1.8 oz)  Intake/Output Summary (Last 24 hours) at 02/01/14 0823 Last data filed at 01/31/14 1900  Gross per 24 hour  Intake    840 ml  Output   4000 ml  Net  -3160 ml   Physical Exam:  General appearance:  Malnourished Resp: Clear to ausculation bilaterally with no wheezing, rhonchi, or rales Cardio: Normal rate and regular rhythm   GI: Normal BS , soft and non-tender, mild distension  Extremities: no edema Psych: cooperative  Lab Results: Basic Metabolic Panel:  Recent Labs Lab 01/30/14 0558 01/31/14 1117 01/31/14 1440 02/01/14 0335  NA 135* 132* 138 138  K 6.0* 6.6* 4.1 4.5  CL 91* 85* 94* 94*  CO2 26 26 28 28   GLUCOSE 99 128* 101* 81  BUN 55* 81* 20 33*  CREATININE 5.00* 6.41* 2.12* 3.70*  CALCIUM 10.2 10.3 9.7 9.6  PHOS 5.1* 7.1*  --   --    Liver Function Tests:  Recent Labs Lab 01/30/14 0558 01/31/14 1117  ALBUMIN 3.0* 3.0*     Studies/Results: No results found. Medications: I have reviewed the patient's current medications. Scheduled Meds: . aspirin  81 mg Oral Daily  . darbepoetin (ARANESP) injection - DIALYSIS  25 mcg Intravenous Q Tue-HD  . diltiazem  120 mg Oral Daily  . doxercalciferol  2 mcg Intravenous Q T,Th,Sa-HD  . feeding supplement (NEPRO CARB STEADY)  237 mL Oral BID BM  . ferric gluconate (FERRLECIT/NULECIT) IV  62.5 mg Intravenous Q Tue-HD  . folic acid  1 mg Oral Daily  . heparin  3,000 Units Dialysis Once in dialysis  . heparin  5,000 Units Subcutaneous 3 times per day  . lactulose  10 g Oral TID  . lanthanum  1,000 mg Oral TID WC  . QUEtiapine  50 mg Oral QHS  . sertraline  25 mg Oral Daily  . sodium chloride  3 mL Intravenous Q12H  . tiotropium  18 mcg Inhalation Daily  . traZODone  100 mg Oral QHS   Continuous Infusions:  PRN Meds:.sodium chloride, sodium chloride, sodium chloride, acetaminophen, acetaminophen, albuterol, benzonatate, camphor-menthol, docusate sodium, guaiFENesin, hydrOXYzine, ondansetron (ZOFRAN) IV, ondansetron, sorbitol Assessment/Plan:  Assessment: 58 year old with ESRD on HD, decompensated liver cirrhosis, COPD, HTN, and AFib (not on coumadin),  and PTSD with multiple ED admissions in the past 3 months who presented on 3/7 with active suicidal thoughts.   Plan:   Recurrent Severe Major Depression and PTSD - currently without suicidal Ideation and medically stable for discharge -Appreciate Psychiatry consult --> rescind involuntary commitment as patient has no more safety concerns, d/c suicide precautions and safety sitter -Continue sertraline 25 mg daily for depression/anxiety and on discharge -Continue quetiapine 50 mg daily for agitation and on discharge -Continue trazadone 50 mg daily at bedtime for insomnia and on discharge -Pt scheduled for [facility] f/u appt on 02/15/14 with Dr. Leonie Douglas (pt's previous PCP in [place]) -Pt's son and wife  will be taking pt to [place] today (to get into the [place] system) . They will also help with living arrangements (assisted living facility), make sure to pick up his prescriptions, and assist in transportation to HD at [facility]   Hyperkalemia -  Resolved. Most likely due to recent dietary indiscretion in setting of ESRD. -HD TTS   Hyperphosphatemia in setting of Secondary Hyperparathyroidism -  Etiology most likely secondary due to ESRD. PTH level on 3/20 was high (86).   -Continue fosrenol 1000 mg TID with meals -Continue hectoral with HD    ESRD - Pt on TTS schedule at [facility]. Received HD on 3/7, 3/10, 3/12, 3/14, 3/17, 3/19, 3/21   -Renal diet -Nephrology following, on TTS schedule --> pt to attend outpatient HD at [facility] -Continue nepro supplements and and fosrenol 4 TID with meals  -Hectoral with HD,  ESA started weekly on 3/24 -Monitor daily weight and volume status   Paroxysmal Atrial Fibrillation - currently NSR and normal HR. CHADS score of 1 (HTN) on daily AP therapy. Per VA records he was previously on diltiazem ER 120 mg daily and for a period of time on coumadin for [facility] therapy. -Continue 81 mg aspirin daily  -Continue PO diltiazem CD 120 mg daily  -Monitor on telemetry   Hypertension - Currently normotensive -Continue PO diltiazem CD 120 mg daily   COPD - currently with no exacerbation  -Spiriva daily -Albuterol inhaler as needed  Chronic Microcytic Anemia - Pt with Hg near baseline of 10. Etiology likely due to CKD. Pt receives ESA and erythropoetin weekly at HD. Anemia panel on 3/19 revealed ferritin (1021 H), iron 121, TIBC 338, iron sat 36% most likely consistent with ACD.  -Monitor for bleeding  -Transfuse if Hg <7  -To restart ESA weekly with HD today  Chronic Liver Cirrhosis - stable.  Pt with liver function testing on admission that appears at baseline. No active bleeding. Etiology most likely due to chronic hepatitis C infection.  -Continue  lactulose 10g TID  -Continue hydroxyzine 25 mg Q 6hr PRN pruritis  -Continue folic acid 1 mg daily  -Phenergan PRN nausea (avoid zofran in setting of prolonged QT)   Severe Malnutrition - Pt with chronic hypoalbuminemia in setting of ESRD and liver cirrhosis. Pt with evidence of severe muscle wasting and 8% weight loss in last month with BMI of 20.12  -Renal diet -Double trays and snacks  -Nepro Shake po TID  -Monitor weight    Social Issues - Currently homeless (was living at extended stay hotel). Pt unable to afford any of his medications, however reported he would receive VA check later this month (spent on cocaine last month).  Pt was to follow-up with [facility] in [facility] on March 6 but did not attend. Pt presented on admission requesting  transfer to [place].  -CM/Psych SW following  -Pt now in contact with son   Diet: Renal  DVT Ppx: SQ heparin TID  Code: Full Dispo: Today  The patient does not have a current PCP (No Pcp Per Patient) and does need an [facility] hospital follow-up appointment after discharge.  The patient does have transportation limitations that hinder transportation to clinic appointments.  .Services Needed at time of discharge: Y = Yes, Blank = No PT: No  OT: No  RN: No  Equipment: None  Other: None    LOS: 18 days   Otis Brace, MD 02/01/2014, 8:23 AM

## 2014-02-01 NOTE — Progress Notes (Addendum)
Discharge instructions reviewed with patient and sister in law and brother, to have psych followup in [facility], has address. Verbalizes understanding of discharge instructions and followup, transported via wheelchair to private vehicle to home with family,albuterol inhaler provided for patient per MD order, Berle Mull RN

## 2014-02-01 NOTE — Progress Notes (Signed)
[facility] Progress Note  Subjective:   Leaving shortly - to go to [facility] today with family support  Objective Filed Vitals:   01/31/14 1510 01/31/14 2339 02/01/14 0504 02/01/14 0733  BP: 157/100 125/78 126/80   Pulse: 94 88 86   Temp: 98.6 F (37 C) 98.5 F (36.9 C) 98.6 F (37 C)   TempSrc: Oral Oral Oral   Resp: 18 16 17    Height:      Weight: 51.5 kg (113 lb 8.6 oz)     SpO2:  98% 97% 98%   Physical Exam General: NAD Heart: RRR Lungs: no wheezes or rales Abdomen: slight ascites Extremities: no sig edema Dialysis Access: right upper AVGG + bruit   Dialysis: TTS [facility]  4h New dry wt lower ?? (was 55.5 kg) 2/2.25 [place] Heparin 3000 RUA AVG  Hect 2 EPO 2800 Venofer 50/wk   Assessment/Plan:  1 Suicidal ideation/PTSD/depression - re-evaluated by psych yesterday - ok for d/c to f/u with outpt mental health [facility] the [facility] with current meds.  2 ESRD TTS -hyperkalemia - post HD K 4.1 and 4.5 this am - ok; will notify center of d/c today; pt and family aware of HD plans for tomorrow. 3 HTN/volume- UF 4 L Tuesday - with post weight of 51.5 - BP better - new edw at discharge. 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 To Kirtland Bouchard^ K and Loleta Dicker^Phos; mutivit and nepro   Sheffield SliderMartha B Bergman, PA-C [facility] Beeper [phone] 02/01/2014,9:43 AM  LOS: 18 days   Pt seen, examined and agree w A/P as above.  Carl Benson Ondra Deboard MD pager [phone]    cell [phone] 02/01/2014, 10:49 AM    Additional Objective Labs: Basic Metabolic Panel:  Recent Labs Lab 01/27/14 0422 01/30/14 0558 01/31/14 1117 01/31/14 1440 02/01/14 0335  NA 134* 135* 132* 138 138  K 5.8* 6.0* 6.6* 4.1 4.5  CL 92* 91*  85* 94* 94*  CO2 25 26 26 28 28   GLUCOSE 92 99 128* 101* 81  BUN 45* 55* 81* 20 33*  CREATININE 4.17* 5.00* 6.41* 2.12* 3.70*  CALCIUM 9.4 10.2 10.3 9.7 9.6  PHOS 6.7* 5.1* 7.1*  --   --    Liver Function Tests:  Recent Labs Lab 01/27/14 0422 01/30/14 0558 01/31/14 1117  ALBUMIN 2.8* 3.0* 3.0*   CBC:  Recent Labs Lab 01/31/14 1117  WBC 8.9  HGB 9.5*  HCT 27.3*  MCV 75.0*  PLT 124*  Medications:   . aspirin  81 mg Oral Daily  . darbepoetin (ARANESP) injection - DIALYSIS  25 mcg Intravenous Q Tue-HD  . diltiazem  120 mg Oral Daily  . doxercalciferol  2 mcg Intravenous Q T,Th,Sa-HD  . feeding supplement (NEPRO CARB STEADY)  237 mL Oral BID BM  . ferric gluconate (FERRLECIT/NULECIT) IV  62.5 mg Intravenous Q Tue-HD  . folic acid  1 mg Oral Daily  . heparin  3,000 Units Dialysis Once in dialysis  . heparin  5,000 Units Subcutaneous 3 times per day  . lactulose  10 g Oral TID  . lanthanum  1,000 mg Oral TID WC  . QUEtiapine  50 mg Oral QHS  . sertraline  25 mg Oral Daily  . sodium chloride  3 mL Intravenous Q12H  . tiotropium  18 mcg Inhalation Daily  . traZODone  100 mg Oral QHS

## 2014-02-01 NOTE — Clinical Social Work Note (Signed)
Patient has been cleared by Psychiatry for d/c home- spoke with patient who states he has been homeless and staying at a shelter but cannot return there until 6 months has lapsed- he states he has money in the bank and can make a withdrawal if needed and get a room somewhere. I spoke with his son who is enroute to pick him up and he states he is taking him to the [facility] [place] upon d/c. He also plans [facility] [place]. Patient up walking on unit this morning- interacting with staff. IVS papers have been rescinded by MD and I have faxed these to the Magistrate. No further CSW needs at this time.  Reece Levy, MSW, Theresia Majors [phone]

## 2014-02-06 NOTE — Discharge Summary (Signed)
  Date: 02/06/2014  Patient name: Carl Benson  Medical record number: [id number]  Date of birth: Apr 15, 1956   This patient has been seen and the plan of care was discussed with the house staff. Please see their note for complete details. I concur with their findings and plan.  Jonah Blue, DO, FACP Faculty [facility] Internal Medicine Residency Program 02/06/2014, 2:28 PM

## 2014-04-12 ENCOUNTER — Encounter (HOSPITAL_COMMUNITY): Payer: Self-pay | Admitting: Emergency Medicine

## 2014-04-12 ENCOUNTER — Inpatient Hospital Stay (HOSPITAL_COMMUNITY)
Admission: EM | Admit: 2014-04-12 | Discharge: 2014-04-17 | DRG: 871 | Disposition: A | Discharge status: Home / Self Care | Payer: Medicare Other | Attending: Internal Medicine | Admitting: Internal Medicine

## 2014-04-12 ENCOUNTER — Emergency Department (HOSPITAL_COMMUNITY): Payer: Medicare Other

## 2014-04-12 DIAGNOSIS — Z992 Dependence on renal dialysis: Secondary | ICD-10-CM

## 2014-04-12 DIAGNOSIS — I959 Hypotension, unspecified: Secondary | ICD-10-CM | POA: Diagnosis present

## 2014-04-12 DIAGNOSIS — R509 Fever, unspecified: Secondary | ICD-10-CM

## 2014-04-12 DIAGNOSIS — R0603 Acute respiratory distress: Secondary | ICD-10-CM

## 2014-04-12 DIAGNOSIS — E8779 Other fluid overload: Secondary | ICD-10-CM | POA: Diagnosis present

## 2014-04-12 DIAGNOSIS — R7989 Other specified abnormal findings of blood chemistry: Secondary | ICD-10-CM

## 2014-04-12 DIAGNOSIS — F4310 Post-traumatic stress disorder, unspecified: Secondary | ICD-10-CM | POA: Diagnosis present

## 2014-04-12 DIAGNOSIS — I5023 Acute on chronic systolic (congestive) heart failure: Secondary | ICD-10-CM | POA: Diagnosis present

## 2014-04-12 DIAGNOSIS — I2789 Other specified pulmonary heart diseases: Secondary | ICD-10-CM | POA: Diagnosis present

## 2014-04-12 DIAGNOSIS — N039 Chronic nephritic syndrome with unspecified morphologic changes: Secondary | ICD-10-CM

## 2014-04-12 DIAGNOSIS — R6521 Severe sepsis with septic shock: Secondary | ICD-10-CM

## 2014-04-12 DIAGNOSIS — I10 Essential (primary) hypertension: Secondary | ICD-10-CM

## 2014-04-12 DIAGNOSIS — R0600 Dyspnea, unspecified: Secondary | ICD-10-CM | POA: Diagnosis present

## 2014-04-12 DIAGNOSIS — I120 Hypertensive chronic kidney disease with stage 5 chronic kidney disease or end stage renal disease: Secondary | ICD-10-CM | POA: Diagnosis present

## 2014-04-12 DIAGNOSIS — E162 Hypoglycemia, unspecified: Secondary | ICD-10-CM | POA: Diagnosis not present

## 2014-04-12 DIAGNOSIS — J9600 Acute respiratory failure, unspecified whether with hypoxia or hypercapnia: Secondary | ICD-10-CM

## 2014-04-12 DIAGNOSIS — R7309 Other abnormal glucose: Secondary | ICD-10-CM | POA: Diagnosis not present

## 2014-04-12 DIAGNOSIS — R6520 Severe sepsis without septic shock: Secondary | ICD-10-CM

## 2014-04-12 DIAGNOSIS — R799 Abnormal finding of blood chemistry, unspecified: Secondary | ICD-10-CM | POA: Diagnosis present

## 2014-04-12 DIAGNOSIS — I4892 Unspecified atrial flutter: Secondary | ICD-10-CM | POA: Diagnosis present

## 2014-04-12 DIAGNOSIS — E861 Hypovolemia: Secondary | ICD-10-CM | POA: Diagnosis present

## 2014-04-12 DIAGNOSIS — I509 Heart failure, unspecified: Secondary | ICD-10-CM

## 2014-04-12 DIAGNOSIS — B182 Chronic viral hepatitis C: Secondary | ICD-10-CM

## 2014-04-12 DIAGNOSIS — E875 Hyperkalemia: Secondary | ICD-10-CM

## 2014-04-12 DIAGNOSIS — F17200 Nicotine dependence, unspecified, uncomplicated: Secondary | ICD-10-CM | POA: Diagnosis present

## 2014-04-12 DIAGNOSIS — J9620 Acute and chronic respiratory failure, unspecified whether with hypoxia or hypercapnia: Secondary | ICD-10-CM | POA: Diagnosis present

## 2014-04-12 DIAGNOSIS — E876 Hypokalemia: Secondary | ICD-10-CM | POA: Diagnosis present

## 2014-04-12 DIAGNOSIS — E872 Acidosis, unspecified: Secondary | ICD-10-CM | POA: Diagnosis present

## 2014-04-12 DIAGNOSIS — F1910 Other psychoactive substance abuse, uncomplicated: Secondary | ICD-10-CM | POA: Diagnosis present

## 2014-04-12 DIAGNOSIS — A419 Sepsis, unspecified organism: Principal | ICD-10-CM | POA: Diagnosis present

## 2014-04-12 DIAGNOSIS — I480 Paroxysmal atrial fibrillation: Secondary | ICD-10-CM

## 2014-04-12 DIAGNOSIS — E8770 Fluid overload, unspecified: Secondary | ICD-10-CM

## 2014-04-12 DIAGNOSIS — K7460 Unspecified cirrhosis of liver: Secondary | ICD-10-CM | POA: Diagnosis present

## 2014-04-12 DIAGNOSIS — I4891 Unspecified atrial fibrillation: Secondary | ICD-10-CM

## 2014-04-12 DIAGNOSIS — I079 Rheumatic tricuspid valve disease, unspecified: Secondary | ICD-10-CM | POA: Diagnosis present

## 2014-04-12 DIAGNOSIS — N186 End stage renal disease: Secondary | ICD-10-CM

## 2014-04-12 DIAGNOSIS — I428 Other cardiomyopathies: Secondary | ICD-10-CM | POA: Diagnosis present

## 2014-04-12 DIAGNOSIS — R778 Other specified abnormalities of plasma proteins: Secondary | ICD-10-CM | POA: Diagnosis present

## 2014-04-12 DIAGNOSIS — Z91158 Patient's noncompliance with renal dialysis for other reason: Secondary | ICD-10-CM

## 2014-04-12 DIAGNOSIS — A0472 Enterocolitis due to Clostridium difficile, not specified as recurrent: Secondary | ICD-10-CM

## 2014-04-12 DIAGNOSIS — G9340 Encephalopathy, unspecified: Secondary | ICD-10-CM | POA: Diagnosis present

## 2014-04-12 DIAGNOSIS — J449 Chronic obstructive pulmonary disease, unspecified: Secondary | ICD-10-CM | POA: Diagnosis present

## 2014-04-12 DIAGNOSIS — I420 Dilated cardiomyopathy: Secondary | ICD-10-CM

## 2014-04-12 DIAGNOSIS — Z9115 Patient's noncompliance with renal dialysis: Secondary | ICD-10-CM

## 2014-04-12 DIAGNOSIS — Z9119 Patient's noncompliance with other medical treatment and regimen: Secondary | ICD-10-CM

## 2014-04-12 DIAGNOSIS — D696 Thrombocytopenia, unspecified: Secondary | ICD-10-CM | POA: Diagnosis present

## 2014-04-12 DIAGNOSIS — N2581 Secondary hyperparathyroidism of renal origin: Secondary | ICD-10-CM | POA: Diagnosis present

## 2014-04-12 DIAGNOSIS — R188 Other ascites: Secondary | ICD-10-CM

## 2014-04-12 DIAGNOSIS — J4489 Other specified chronic obstructive pulmonary disease: Secondary | ICD-10-CM | POA: Diagnosis present

## 2014-04-12 DIAGNOSIS — E46 Unspecified protein-calorie malnutrition: Secondary | ICD-10-CM | POA: Diagnosis present

## 2014-04-12 DIAGNOSIS — D631 Anemia in chronic kidney disease: Secondary | ICD-10-CM | POA: Diagnosis present

## 2014-04-12 DIAGNOSIS — Z91199 Patient's noncompliance with other medical treatment and regimen due to unspecified reason: Secondary | ICD-10-CM

## 2014-04-12 DIAGNOSIS — I319 Disease of pericardium, unspecified: Secondary | ICD-10-CM | POA: Diagnosis present

## 2014-04-12 LAB — HEPATIC FUNCTION PANEL
ALT: 47 U/L (ref 0–53)
AST: 68 U/L — ABNORMAL HIGH (ref 0–37)
Albumin: 4.2 g/dL (ref 3.5–5.2)
Alkaline Phosphatase: 112 U/L (ref 39–117)
BILIRUBIN DIRECT: 1.1 mg/dL — AB (ref 0.0–0.3)
BILIRUBIN INDIRECT: 0.4 mg/dL (ref 0.3–0.9)
Total Bilirubin: 1.5 mg/dL — ABNORMAL HIGH (ref 0.3–1.2)
Total Protein: 9.5 g/dL — ABNORMAL HIGH (ref 6.0–8.3)

## 2014-04-12 LAB — CBC
HCT: 37 % — ABNORMAL LOW (ref 39.0–52.0)
HEMOGLOBIN: 12.7 g/dL — AB (ref 13.0–17.0)
MCH: 26.5 pg (ref 26.0–34.0)
MCHC: 34.3 g/dL (ref 30.0–36.0)
MCV: 77.2 fL — ABNORMAL LOW (ref 78.0–100.0)
Platelets: 106 10*3/uL — ABNORMAL LOW (ref 150–400)
RBC: 4.79 MIL/uL (ref 4.22–5.81)
RDW: 15.3 % (ref 11.5–15.5)
WBC: 19.2 10*3/uL — ABNORMAL HIGH (ref 4.0–10.5)

## 2014-04-12 LAB — BASIC METABOLIC PANEL
BUN: 92 mg/dL — ABNORMAL HIGH (ref 6–23)
CALCIUM: 10.2 mg/dL (ref 8.4–10.5)
CO2: 12 mEq/L — ABNORMAL LOW (ref 19–32)
Chloride: 85 mEq/L — ABNORMAL LOW (ref 96–112)
Creatinine, Ser: 14.2 mg/dL — ABNORMAL HIGH (ref 0.50–1.35)
GFR calc Af Amer: 4 mL/min — ABNORMAL LOW (ref >90)
GFR calc non Af Amer: 3 mL/min — ABNORMAL LOW (ref >90)
Glucose, Bld: 61 mg/dL — ABNORMAL LOW (ref 70–99)
SODIUM: 136 meq/L — AB (ref 137–147)

## 2014-04-12 LAB — PRO B NATRIURETIC PEPTIDE: Pro B Natriuretic peptide (BNP): >70000 pg/mL — ABNORMAL HIGH (ref 0–125)

## 2014-04-12 LAB — I-STAT TROPONIN, ED: TROPONIN I, POC: 0.49 ng/mL — AB (ref 0.00–0.08)

## 2014-04-12 LAB — TROPONIN I: TROPONIN I: 0.61 ng/mL — AB (ref <0.30)

## 2014-04-12 LAB — GLUCOSE, CAPILLARY
Glucose-Capillary: 85 mg/dL (ref 70–99)
Glucose-Capillary: 97 mg/dL (ref 70–99)

## 2014-04-12 LAB — POTASSIUM: POTASSIUM: 3.4 meq/L — AB (ref 3.7–5.3)

## 2014-04-12 LAB — CBG MONITORING, ED: GLUCOSE-CAPILLARY: 84 mg/dL (ref 70–99)

## 2014-04-12 LAB — AMMONIA: Ammonia: 45 umol/L (ref 11–60)

## 2014-04-12 LAB — MRSA PCR SCREENING: MRSA BY PCR: NEGATIVE

## 2014-04-12 LAB — LACTIC ACID, PLASMA: LACTIC ACID, VENOUS: 8.2 mmol/L — AB (ref 0.5–2.2)

## 2014-04-12 MED ORDER — HEPARIN SODIUM (PORCINE) 5000 UNIT/ML IJ SOLN
5000.0000 [IU] | Freq: Three times a day (TID) | INTRAMUSCULAR | Status: DC
  Administered 2014-04-12 – 2014-04-17 (×12): 5000 [IU] via SUBCUTANEOUS
  Filled 2014-04-12 (×18): qty 1

## 2014-04-12 MED ORDER — OXYCODONE HCL 5 MG PO TABS
5.0000 mg | ORAL_TABLET | ORAL | Status: DC | PRN
  Administered 2014-04-17: 5 mg via ORAL
  Filled 2014-04-12 (×7): qty 1

## 2014-04-12 MED ORDER — SODIUM CHLORIDE 0.9 % IV SOLN: INTRAVENOUS | Status: AC

## 2014-04-12 MED ORDER — SODIUM CHLORIDE 0.9 % IV SOLN: 100.0000 mL | INTRAVENOUS | Status: DC | PRN

## 2014-04-12 MED ORDER — ONDANSETRON HCL 4 MG PO TABS
4.0000 mg | ORAL_TABLET | Freq: Four times a day (QID) | ORAL | Status: DC | PRN
Start: 2014-04-12 — End: 2014-04-17

## 2014-04-12 MED ORDER — SODIUM CHLORIDE 0.9 % IV SOLN
500.0000 mg | INTRAVENOUS | Status: DC
  Filled 2014-04-12: qty 500

## 2014-04-12 MED ORDER — ONDANSETRON HCL 4 MG/2ML IJ SOLN: 4.0000 mg | Freq: Four times a day (QID) | INTRAMUSCULAR | Status: DC | PRN

## 2014-04-12 MED ORDER — LIDOCAINE-PRILOCAINE 2.5-2.5 % EX CREA
1.0000 "application " | TOPICAL_CREAM | CUTANEOUS | Status: DC | PRN
  Filled 2014-04-12: qty 5

## 2014-04-12 MED ORDER — NEPRO/CARBSTEADY PO LIQD
237.0000 mL | ORAL | Status: DC | PRN
  Filled 2014-04-12: qty 237

## 2014-04-12 MED ORDER — HEPARIN SODIUM (PORCINE) 1000 UNIT/ML DIALYSIS: 3000.0000 [IU] | Freq: Once | INTRAMUSCULAR | Status: DC

## 2014-04-12 MED ORDER — BIOTENE DRY MOUTH MT LIQD
15.0000 mL | Freq: Two times a day (BID) | OROMUCOSAL | Status: DC
  Administered 2014-04-12 – 2014-04-17 (×10): 15 mL via OROMUCOSAL

## 2014-04-12 MED ORDER — METOPROLOL TARTRATE 50 MG PO TABS
50.0000 mg | ORAL_TABLET | Freq: Two times a day (BID) | ORAL | Status: DC
  Filled 2014-04-12: qty 1

## 2014-04-12 MED ORDER — HEPARIN SODIUM (PORCINE) 1000 UNIT/ML DIALYSIS: 1000.0000 [IU] | INTRAMUSCULAR | Status: DC | PRN

## 2014-04-12 MED ORDER — SODIUM CHLORIDE 0.9 % IJ SOLN
3.0000 mL | Freq: Two times a day (BID) | INTRAMUSCULAR | Status: DC
  Administered 2014-04-14 – 2014-04-16 (×6): 3 mL via INTRAVENOUS

## 2014-04-12 MED ORDER — LIDOCAINE HCL (PF) 1 % IJ SOLN: 5.0000 mL | INTRAMUSCULAR | Status: DC | PRN

## 2014-04-12 MED ORDER — DILTIAZEM LOAD VIA INFUSION
10.0000 mg | Freq: Once | INTRAVENOUS | Status: DC | PRN
  Administered 2014-04-12: 10 mg via INTRAVENOUS

## 2014-04-12 MED ORDER — FENTANYL CITRATE 0.05 MG/ML IJ SOLN
50.0000 ug | INTRAMUSCULAR | Status: AC
  Administered 2014-04-12: 50 ug via INTRAVENOUS
  Filled 2014-04-12: qty 2

## 2014-04-12 MED ORDER — MORPHINE SULFATE 2 MG/ML IJ SOLN
1.0000 mg | INTRAMUSCULAR | Status: DC | PRN
  Administered 2014-04-13 – 2014-04-14 (×2): 1 mg via INTRAVENOUS
  Filled 2014-04-12 (×2): qty 1

## 2014-04-12 MED ORDER — SODIUM CHLORIDE 0.9 % IV SOLN: 62.5000 mg | INTRAVENOUS | Status: DC

## 2014-04-12 MED ORDER — DARBEPOETIN ALFA-POLYSORBATE 25 MCG/0.42ML IJ SOLN
INTRAMUSCULAR | Status: AC
  Administered 2014-04-12: 25 ug via INTRAVENOUS
  Filled 2014-04-12: qty 0.42

## 2014-04-12 MED ORDER — DEXTROSE 5 % IV SOLN
2.0000 g | INTRAVENOUS | Status: DC
  Administered 2014-04-12: 2 g via INTRAVENOUS
  Filled 2014-04-12 (×2): qty 2

## 2014-04-12 MED ORDER — ACETAMINOPHEN 650 MG RE SUPP: 650.0000 mg | Freq: Four times a day (QID) | RECTAL | Status: DC | PRN

## 2014-04-12 MED ORDER — METOPROLOL TARTRATE 25 MG PO TABS
25.0000 mg | ORAL_TABLET | Freq: Two times a day (BID) | ORAL | Status: DC
  Administered 2014-04-12: 25 mg via ORAL
  Filled 2014-04-12 (×2): qty 1

## 2014-04-12 MED ORDER — SODIUM CHLORIDE 0.9 % IJ SOLN: 3.0000 mL | INTRAMUSCULAR | Status: DC | PRN

## 2014-04-12 MED ORDER — DARBEPOETIN ALFA-POLYSORBATE 25 MCG/0.42ML IJ SOLN
25.0000 ug | INTRAMUSCULAR | Status: DC
  Administered 2014-04-12: 25 ug via INTRAVENOUS

## 2014-04-12 MED ORDER — LEVALBUTEROL HCL 0.63 MG/3ML IN NEBU: 0.6300 mg | INHALATION_SOLUTION | Freq: Four times a day (QID) | RESPIRATORY_TRACT | Status: DC | PRN

## 2014-04-12 MED ORDER — ALBUTEROL SULFATE (2.5 MG/3ML) 0.083% IN NEBU
5.0000 mg | INHALATION_SOLUTION | Freq: Once | RESPIRATORY_TRACT | Status: AC
  Administered 2014-04-12: 5 mg via RESPIRATORY_TRACT
  Filled 2014-04-12: qty 6

## 2014-04-12 MED ORDER — ACETAMINOPHEN 325 MG PO TABS
650.0000 mg | ORAL_TABLET | Freq: Four times a day (QID) | ORAL | Status: DC | PRN
  Administered 2014-04-13: 650 mg via ORAL
  Filled 2014-04-12: qty 2

## 2014-04-12 MED ORDER — VANCOMYCIN HCL IN DEXTROSE 1-5 GM/200ML-% IV SOLN
1000.0000 mg | Freq: Once | INTRAVENOUS | Status: AC
  Administered 2014-04-13: 1000 mg via INTRAVENOUS
  Filled 2014-04-12 (×2): qty 200

## 2014-04-12 MED ORDER — ASPIRIN EC 81 MG PO TBEC
81.0000 mg | DELAYED_RELEASE_TABLET | Freq: Every day | ORAL | Status: DC
  Administered 2014-04-12: 81 mg via ORAL
  Filled 2014-04-12 (×2): qty 1

## 2014-04-12 MED ORDER — SODIUM CHLORIDE 0.9 % IJ SOLN
3.0000 mL | Freq: Two times a day (BID) | INTRAMUSCULAR | Status: DC
  Administered 2014-04-13: 3 mL via INTRAVENOUS

## 2014-04-12 MED ORDER — DOXERCALCIFEROL 4 MCG/2ML IV SOLN
1.0000 ug | INTRAVENOUS | Status: DC
  Administered 2014-04-14 – 2014-04-17 (×2): 1 ug via INTRAVENOUS
  Filled 2014-04-12 (×2): qty 2

## 2014-04-12 MED ORDER — SODIUM CHLORIDE 0.9 % IV SOLN
250.0000 mL | INTRAVENOUS | Status: DC | PRN
  Administered 2014-04-13: 13:00:00 via INTRAVENOUS

## 2014-04-12 MED ORDER — ALTEPLASE 2 MG IJ SOLR
2.0000 mg | Freq: Once | INTRAMUSCULAR | Status: AC | PRN
  Filled 2014-04-12: qty 2

## 2014-04-12 MED ORDER — PENTAFLUOROPROP-TETRAFLUOROETH EX AERO
1.0000 | INHALATION_SPRAY | CUTANEOUS | Status: DC | PRN
Start: 2014-04-12 — End: 2014-04-13

## 2014-04-12 MED ORDER — SODIUM CHLORIDE 0.9 % IV SOLN
1.0000 g | INTRAVENOUS | Status: AC
  Filled 2014-04-12 (×2): qty 10

## 2014-04-12 MED ORDER — SODIUM POLYSTYRENE SULFONATE 15 GM/60ML PO SUSP
30.0000 g | Freq: Once | ORAL | Status: AC
  Administered 2014-04-12: 30 g via ORAL
  Filled 2014-04-12: qty 120

## 2014-04-12 MED ORDER — INSULIN ASPART 100 UNIT/ML IV SOLN
5.0000 [IU] | INTRAVENOUS | Status: AC
  Administered 2014-04-12: 5 [IU] via INTRAVENOUS
  Filled 2014-04-12 (×2): qty 0.05

## 2014-04-12 MED ORDER — DILTIAZEM HCL 100 MG IV SOLR: 5.0000 mg/h | INTRAVENOUS | Status: DC

## 2014-04-12 MED ORDER — DEXTROSE 50 % IV SOLN
25.0000 mL | Freq: Once | INTRAVENOUS | Status: AC
  Administered 2014-04-12: 25 mL via INTRAVENOUS
  Filled 2014-04-12: qty 50

## 2014-04-12 MED ORDER — DILTIAZEM LOAD VIA INFUSION
15.0000 mg | Freq: Once | INTRAVENOUS | Status: AC
  Administered 2014-04-12: 15 mg via INTRAVENOUS
  Filled 2014-04-12: qty 15

## 2014-04-12 MED ORDER — DILTIAZEM HCL 25 MG/5ML IV SOLN: 10.0000 mg | INTRAVENOUS | Status: AC

## 2014-04-12 MED ORDER — DILTIAZEM HCL 100 MG IV SOLR
5.0000 mg/h | INTRAVENOUS | Status: DC
  Administered 2014-04-12: 15 mg/h via INTRAVENOUS
  Administered 2014-04-12: 10 mg/h via INTRAVENOUS
  Administered 2014-04-12: 15 mg/h via INTRAVENOUS
  Administered 2014-04-12 – 2014-04-13 (×2): 5 mg/h via INTRAVENOUS
  Administered 2014-04-13: 10 mg/h via INTRAVENOUS
  Administered 2014-04-13: 15 mg/h via INTRAVENOUS
  Filled 2014-04-12 (×2): qty 100

## 2014-04-12 NOTE — ED Notes (Signed)
MD at bedside. Admitting  

## 2014-04-12 NOTE — ED Notes (Signed)
Meds requested from Pharm STAT

## 2014-04-12 NOTE — ED Notes (Signed)
2 L placed on pt to support work of breathing.

## 2014-04-12 NOTE — Progress Notes (Signed)
RT unable to draw ABG, charge RT attempted without success. Pt transported to dialysis with Bipap at bedside on standby. Pt states he does not want to wear at this time.

## 2014-04-12 NOTE — ED Notes (Signed)
Fistula positive for bruit and thrill

## 2014-04-12 NOTE — Progress Notes (Signed)
Dr. Rito Ehrlich notified of tachycardia with rate 180s.

## 2014-04-12 NOTE — ED Notes (Addendum)
Communication with Pharmacy to send meds STAT

## 2014-04-12 NOTE — ED Notes (Signed)
Per PA do not in and out for urine drug screen

## 2014-04-12 NOTE — H&P (Signed)
[facility] History and Physical  VERN GUERETTE KKX:[id number] DOB: 12-10-1955 DOA: 04/12/2014   PCP: He is followed at the [facility] Specialists: Followed by nephrology, but unclear where  Chief Complaint: Shortness of breath since yesterday  HPI: Carl Benson is a 58 y.o. male with a past history of hypertension, COPD, end-stage renal disease on dialysis on Tuesday, Thursday, Saturday, paroxysmal atrial fibrillation, noncompliance, who was in his usual state of health till sometime yesterday when he started feeling shortness of breath. Patient is currently on BiPAP and is quite tachypneic. Hence, history could not be obtained from him. He, however, denied any chest pain. He missed his dialysis on Tuesday. He denies any nausea, vomiting. History is limited.  Home Medications: He apparently hasn't filled any prescriptions from his pharmacy since March. Prior to Admission medications   Not on File    Allergies: No Known Allergies  Past Medical History: Past Medical History  Diagnosis Date  . COPD (chronic obstructive pulmonary disease)   . Hypertension   . Hep C w/ coma, chronic   . Irregular heartbeat   . ESRD (end stage renal disease) on dialysis     /notes 11/11/2013  . Smoker unmotivated to quit   . Active smoker   . PTSD (post-traumatic stress disorder)   . Shortness of breath     Past Surgical History  Procedure Laterality Date  . Paracentesis  ~ 10/2013    Archie Endo 11/11/2013    Social History: Unable to obtain due to his respiratory failure  Family History:  Family History  Problem Relation Age of Onset  . Heart disease Mother   . Hypertension Mother   . Diabetes Mother      Review of Systems - unable to obtain due to acute respiratory failure  Physical Examination  Filed Vitals:   04/12/14 1130 04/12/14 1143 04/12/14 1145 04/12/14 1215  BP: 155/96 160/99 160/99 159/100  Pulse: 105  112 112  Temp:  101.7 F (38.7 C)    TempSrc:  Rectal    Resp: 34 30 45  38  SpO2: 99% 100% 100% 100%    BP 159/100  Pulse 112  Temp(Src) 101.7 F (38.7 C) (Rectal)  Resp 38  SpO2 100%  General appearance: alert, cooperative and mild distress Head: Normocephalic, without obvious abnormality, atraumatic Eyes: conjunctivae/corneas clear. PERRL, EOM's intact.  Throat: BiPAP was noted. Neck: no adenopathy, no carotid bruit, no JVD, supple, symmetrical, trachea midline and thyroid not enlarged, symmetric, no tenderness/mass/nodules Back: symmetric, no curvature. ROM normal. No CVA tenderness. Resp: Crackles bilateral bases. No wheezing Cardio: S1-S2 is tachycardic. Regular. No S3, S4. No rubs, murmurs, or bruit.  pedal edema is noted GI: soft, non-tender; bowel sounds normal; no masses,  no organomegaly Extremities: Bilateral pedal edema is present Pulses: 2+ and symmetric Skin: Skin color, texture, turgor normal. No rashes or lesions Neurologic: He is alert. Orientation could not be tested, but he knew where he was. No obvious focal neurological deficits are present  Laboratory Data: Results for orders placed during the hospital encounter of 04/12/14 (from the past 48 hour(s))  CBC     Status: Abnormal   Collection Time    04/12/14  9:23 AM      Result Value Ref Range   WBC 19.2 (*) 4.0 - 10.5 K/uL   RBC 4.79  4.22 - 5.81 MIL/uL   Hemoglobin 12.7 (*) 13.0 - 17.0 g/dL   HCT 37.0 (*) 39.0 - 52.0 %   MCV 77.2 (*)  78.0 - 100.0 fL   MCH 26.5  26.0 - 34.0 pg   MCHC 34.3  30.0 - 36.0 g/dL   RDW 15.3  11.5 - 15.5 %   Platelets 106 (*) 150 - 400 K/uL   Comment: REPEATED TO VERIFY     PLATELET COUNT CONFIRMED BY SMEAR  BASIC METABOLIC PANEL     Status: Abnormal   Collection Time    04/12/14  9:23 AM      Result Value Ref Range   Sodium 136 (*) 137 - 147 mEq/L   Potassium >7.7 (*) 3.7 - 5.3 mEq/L   Comment: REPEATED TO VERIFY     CRITICAL RESULT CALLED TO, READ BACK BY AND VERIFIED WITH:     Renelda Mom AT 1053 04/12/14 BY K BARR     SLIGHT HEMOLYSIS     Chloride 85 (*) 96 - 112 mEq/L   CO2 12 (*) 19 - 32 mEq/L   Glucose, Bld 61 (*) 70 - 99 mg/dL   BUN 92 (*) 6 - 23 mg/dL   Creatinine, Ser 14.20 (*) 0.50 - 1.35 mg/dL   Calcium 10.2  8.4 - 10.5 mg/dL   GFR calc non Af Amer 3 (*) >90 mL/min   GFR calc Af Amer 4 (*) >90 mL/min   Comment: (NOTE)     The eGFR has been calculated using the CKD EPI equation.     This calculation has not been validated in all clinical situations.     eGFR's persistently <90 mL/min signify possible Chronic Kidney     Disease.  Randolm Idol, ED     Status: Abnormal   Collection Time    04/12/14  9:38 AM      Result Value Ref Range   Troponin i, poc 0.49 (*) 0.00 - 0.08 ng/mL   Comment NOTIFIED PHYSICIAN     Comment 3            Comment: Due to the release kinetics of cTnI,     a negative result within the first hours     of the onset of symptoms does not rule out     myocardial infarction with certainty.     If myocardial infarction is still suspected,     repeat the test at appropriate intervals.  PRO B NATRIURETIC PEPTIDE     Status: Abnormal   Collection Time    04/12/14 11:23 AM      Result Value Ref Range   Pro B Natriuretic peptide (BNP) >70000.0 (*) 0 - 125 pg/mL   Comment: RESULT CONFIRMED BY AUTOMATED DILUTION  LACTIC ACID, PLASMA     Status: Abnormal   Collection Time    04/12/14 11:23 AM      Result Value Ref Range   Lactic Acid, Venous 8.2 (*) 0.5 - 2.2 mmol/L  HEPATIC FUNCTION PANEL     Status: Abnormal   Collection Time    04/12/14 11:23 AM      Result Value Ref Range   Total Protein 9.5 (*) 6.0 - 8.3 g/dL   Albumin 4.2  3.5 - 5.2 g/dL   AST 68 (*) 0 - 37 U/L   ALT 47  0 - 53 U/L   Alkaline Phosphatase 112  39 - 117 U/L   Total Bilirubin 1.5 (*) 0.3 - 1.2 mg/dL   Bilirubin, Direct 1.1 (*) 0.0 - 0.3 mg/dL   Indirect Bilirubin 0.4  0.3 - 0.9 mg/dL  CBG MONITORING, ED     Status: None  Collection Time    04/12/14 11:51 AM      Result Value Ref Range   Glucose-Capillary 84   70 - 99 mg/dL    Radiology Reports: Dg Chest Port 1 View  04/12/2014   CLINICAL DATA:  Shortness of Breath  EXAM: PORTABLE CHEST - 1 VIEW  COMPARISON:  January 14, 2014  FINDINGS: There is no edema or consolidation. Heart is enlarged with a degree of pulmonary venous hypertension. No adenopathy. Evidence of prior right clavicle fracture.  IMPRESSION: Cardiomegaly with pulmonary venous hypertension. Suspect early volume overload. No frank edema or consolidation, however.   Electronically Signed   By: Lowella Grip M.D.   On: 04/12/2014 09:26    Electrocardiogram: Sinus tachycardia at 116 beats per minute. Left axis deviation. No Q waves. T inversion noted in 1 aVL, V5, V6. No concerning ST changes. These changes appear to be old. Peaked T waves also noted.  Problem List  Principal Problem:   Acute respiratory failure Active Problems:   COPD (chronic obstructive pulmonary disease)   ESRD on hemodialysis   Hyperkalemia   PAF (paroxysmal atrial fibrillation)   HTN (hypertension)   Hepatic cirrhosis due to chronic hepatitis C infection   PTSD (post-traumatic stress disorder)   Dyspnea   Fever   Assessment: This is a 57 year old, African American male, who presents with a two-day history of worsening shortness of breath. There is some suggestion of orthopnea based on history he provided to the emergency room staff. Chest x-ray shows early volume overload. He missed his dialysis on Tuesday, and, hence, that this is the most likely reason for his presentation. He also has hyperkalemia. He's also noted to be febrile etiology for which is not clearly discernible  Plan: #1 acute respiratory failure due to volume overload: Continue BiPAP for now till dialysis is performed. ABG will be obtained. He'll be admitted to step down unit. If he continues to decompensate, pulmonology may have to be involved. Anticipate improving once dialysis is completed.  #2 hyperkalemia, with end-stage renal disease:  Should improve with the dialysis. ED physician is giving medications to bring down the potassium levels. Nephrology has been consulted by the ED physician and they will dialyze him soon.  #3 hypoglycemia: This has been repeated and level was 84. Continue to monitor in the hospital.  #4 Mildly elevated troponin: Most likely due to renal failure or demand ischemia. EKG does not show any new changes. He will continue to trend Troponin. Give aspirin and beta blocker for now.  #5 fever: He is noted to have a temperature of 101.38F. His WBC is elevated. Chest x-ray did not show any pneumonia per se. No other foci of infection is identified. We will obtain blood cultures and we will place him on broad-spectrum antibiotics with vancomycin and cefepime for now.  #6 lactic acidosis: Most likely due to renal failure. Bicarbonate was noted to be low on blood counts. All these parameters should improve with dialysis.  #7 history of hypertension: Monitor blood pressure closely.  #8 history of paroxysmal atrial fibrillation: Currently, is in sinus rhythm. He is not on anticoagulation, presumably due to his noncompliance.   DVT Prophylaxis: Heparin Code Status: Full code Family Communication: No family at bedside  Disposition Plan: Admit to step down.   Further management decisions will depend on results of further testing and patient's response to treatment.   Bonnielee Haff  [facility] Pager [phone]  If 7PM-7AM, please contact night-coverage [email or web address] Password [facility]  04/12/2014, 12:28  PM  Disclaimer: This note was dictated with voice recognition software. Similar sounding words can inadvertently be transcribed and may not be corrected upon review.

## 2014-04-12 NOTE — ED Notes (Signed)
Per Lab Ammonia has to be re-drawn and sent down on Ice. Prev top used for CBC.

## 2014-04-12 NOTE — Consult Note (Signed)
Renal Service Consult Note [facility]  Carl Benson Carl Benson 04/12/2014 Carl Benson Requesting Physician: Dr Rito EhrlichKrishnan  Reason for Consult:  ESRD patient missed HD with SOB and high K HPI: The patient is Carl 58 y.o. year-old with hx of PTSD, COPD, HTN, hep C and ESRD on HD. Pt presented today with fatigue, SOB on lying down, dizziness for 2-3 days.  Missed last two HD sessions. No fever, chills, CP or cough.  No abd pain.  ROS  no jt pain  no skin rash  no confusion  no HA, blurred vision  Past Medical History  Past Medical History  Diagnosis Date  . COPD (chronic obstructive pulmonary disease)   . Hypertension   . Hep C w/ coma, chronic   . Irregular heartbeat   . ESRD (end stage renal disease) on dialysis     /notes 11/11/2013  . Smoker unmotivated to quit   . Active smoker   . PTSD (post-traumatic stress disorder)   . Shortness of breath    Past Surgical History  Past Surgical History  Procedure Laterality Date  . Paracentesis  ~ 10/2013    Carl Benson/notes 11/11/2013   Family History  Family History  Problem Relation Age of Onset  . Heart disease Mother   . Hypertension Mother   . Diabetes Mother    Social History  reports that he has been smoking Cigarettes.  He has Carl 7.5 pack-year smoking history. He has never used smokeless tobacco. He reports that he uses illicit drugs ("Crack" cocaine, Cocaine, Marijuana, and Other-see comments). He reports that he does not drink alcohol. Allergies No Known Allergies Home medications Prior to Admission medications   Not on File   Liver Function Tests  Recent Labs Lab 04/12/14 1123  AST 68*  ALT 47  ALKPHOS 112  BILITOT 1.5*  PROT 9.5*  ALBUMIN 4.2   No results found for this basename: LIPASE, AMYLASE,  in the last 168 hours CBC  Recent Labs Lab 04/12/14 0923  WBC 19.2*  HGB 12.7*  HCT 37.0*  MCV 77.2*  PLT 106*   Basic Metabolic Panel  Recent Labs Lab 04/12/14 0923  NA 136*  K >7.7*  CL 85*  CO2  12*  GLUCOSE 61*  BUN 92*  CREATININE 14.20*  CALCIUM 10.2    Filed Vitals:   04/12/14 1130 04/12/14 1143 04/12/14 1145 04/12/14 1215  BP: 155/96 160/99 160/99 159/100  Pulse: 105  112 112  Temp:  101.7 F (38.7 C)    TempSrc:  Rectal    Resp: 34 30 45 38  SpO2: 99% 100% 100% 100%   Exam: On bipap, sitting up on side of bed, responsive No rash, cyanosis or gangrene Sclera anicteric, throat clear ++ jvd Chest bilat rales 1/3 up post RRR no MRG Abd firm, mild diffuse tenderness, no masses Trace LE edema bilat  Neuro is nf, ox3 RUA AVG is patent   HD: MWF [place] 3.5h  F160  55kg  2/2.0 [place]   Heparin 3000   RUA AVG Hect 1 ug tiw   Aranesp 25 ug q other wk (last was supposed to be 6/2 but pt no-showed)  Fe 50/wk   Assessment: 1 Severe hyperkalemia 2 Dyspnea / vol excess- prob vol overload, CXR early edema 3 ESRD hx of poor compliance 4 COPD 5 HTN 6 Hep C w cirrhosis 7 Hx PTSD   Plan- HD acutely today   [name] Nikos Anglemyer MD (pgr) [phone]    (c) [phone]  04/12/2014, 1:10 PM

## 2014-04-12 NOTE — Progress Notes (Signed)
Called by Rn as patient was tachycardic.  Patient denied chest pain.  He was noted to have HR in 180's. BP was normal.  EKG was obtained and showed Atrial Fibrillation with RVR.  Cardizem 10mg  was given as bolus and he was started on infusion.  HR improved to 160's. BP remained 128/71.  Another bolus ordered after about 10 mins on 5mg /hr infusion rate.  Patient known to non compliant and likely hasn't taken any of his medications for last many weeks. This along with acute respiratory failure is likely reason for rapid Afib.  Continue Cardizem infusion. Restart beta blocker. Monitor closely.  Patient status post HD and is doing better from respiratory standpoint.  Carl Benson 6:07 PM

## 2014-04-12 NOTE — Progress Notes (Signed)
Second cardizem bolus of 10 mg given for heart rate greater than 130 as per order.

## 2014-04-12 NOTE — ED Notes (Signed)
Pt completed Neb and is more comfortable sitting in the chair

## 2014-04-12 NOTE — ED Notes (Signed)
Results of troponin given to Dr. Gwendolyn Grant

## 2014-04-12 NOTE — ED Notes (Signed)
PA at bedside.

## 2014-04-12 NOTE — ED Notes (Signed)
Pt placed back in bed for fall risk and safety measures. Rails raised call light in reach.

## 2014-04-12 NOTE — ED Notes (Signed)
Communication with family pt will be admitted.

## 2014-04-12 NOTE — Progress Notes (Addendum)
RN paged secondary to pt's troponin being .61. I stat troponin was high in ED and EKG showed no acute changes. Elevated troponin was thought to be secondary to volume overload (pro BNP > 70000). Later, this afternoon, pt converted to Afib with RVR which is also likely a contributor to elevated troponin. At present, pt is NOT having any chest pain. RN to get new 12 lead to check for changes. One is also ordered for am given Cardizem gtt for Afib. Will continue to cycle cardiac enzymes and monitor. Pt is on ASA and BB. If next troponin more elevated, or if pt begins to have CP, will call cardiology. Jimmye Norman, NP [facility] Update: Pt spiked fever this am of 102F rectally and HR went to 140s. NP to bedside. Pt stated he was cold and SOB but denied chest pain.  Pt shivering and BP was questionably correct on monitor. Aline placed and BP then 80s up to 112 by 0720. HR back down to 90. EKG revealed Aflutter at that time. Card gtt remains on 5. Tylenol given for fever. Pt with tachypnea in high 20s with slight WOB, not severe. Satting normally on 2L [place]. Stat ABG showed pH 7.4, slightly low CO2 and PO2 over 80. For fever, redid CXR which is pending. Lactate high, but decreased over admission lab. WBCC down some. Ordered UA/culture but pt is anuric and may not be able to obtain. Already had blood cultures on admission which are pending. Already covered with 2 broad spectrum abx. Had 5 stools during night and ordered Cdiff. VQ ordered to r/o PE. 2D echo ordered. Complete report given and case discussed with Dr. Joseph Art, [facility] attending. Pt changed to ICU status and will likely need PCCM consult this am. 3rd troponin pending. Still NO chest pain. NP signed off at 0730.  Jimmye Norman, NP [facility]

## 2014-04-12 NOTE — ED Notes (Signed)
Family reports that pt over slept and missed dialysis appt yest.  T/TH/SAT

## 2014-04-12 NOTE — Progress Notes (Signed)
Dr. Rito Ehrlich at bedside assessing pt.  Orders received.  Will continue to monitor pt closely.  Pt is receiving 15mg  cardizem bolus.

## 2014-04-12 NOTE — ED Notes (Signed)
Elpidio Anis, PA notified of Critical Potassium value

## 2014-04-12 NOTE — ED Notes (Addendum)
Pt reports not feeling well x 2-3 days. Having sob that increases when lying down, having dizziness and just doesn't feel good. Dialysis pt, last treatment was Tuesday. Reports pain to left side of abdomen, denies n/v/d.

## 2014-04-12 NOTE — Progress Notes (Signed)
ANTIBIOTIC CONSULT NOTE - INITIAL  Pharmacy Consult:  Vancomycin / Cefepime Indication:  Empiric for fever and leukocytosis  No Known Allergies  Patient Measurements: Height: 5' 4.96" (165 cm) Weight: 118 lb 2.7 oz (53.6 kg) IBW/kg (Calculated) : 61.41  Vital Signs: Temp: 98.2 F (36.8 C) (06/03 1745) Temp src: Oral (06/03 1745) BP: 128/71 mmHg (06/03 1800) Pulse Rate: 158 (06/03 1802)  Intake/Output from this shift: Total I/O In: -  Out: 4000 [Other:4000]  Labs:  Recent Labs  04/12/14 0923  WBC 19.2*  HGB 12.7*  PLT 106*  CREATININE 14.20*   Estimated Creatinine Clearance: 4.2 ml/min (by C-G formula based on Cr of 14.2). No results found for this basename: VANCOTROUGH, VANCOPEAK, VANCORANDOM, GENTTROUGH, GENTPEAK, GENTRANDOM, TOBRATROUGH, TOBRAPEAK, TOBRARND, AMIKACINPEAK, AMIKACINTROU, AMIKACIN,  in the last 72 hours   Microbiology: No results found for this or any previous visit (from the past 720 hour(s)).  Medical History: Past Medical History  Diagnosis Date  . COPD (chronic obstructive pulmonary disease)   . Hypertension   . Hep C w/ coma, chronic   . Irregular heartbeat   . ESRD (end stage renal disease) on dialysis     /notes 11/11/2013  . Smoker unmotivated to quit   . Active smoker   . PTSD (post-traumatic stress disorder)   . Shortness of breath       Assessment: 58 YOM admitted with SOB to start empiric vancomycin and cefepime for fever and leukocytosis.  He has ESRD on HD on TTS.  Aware he is s/p acute HD today.   Goal of Therapy:  Vanc pre-HD level: 15-25 mcg/mL   Plan:  - Vanc 1gm IV x 1, then 500mg  IV q-HD on TTS - Cefepime 2gm IV q-HD on TTS - Monitor HD schedule/tolerance, clinical course, vanc level PRN    Maira Christon D. Laney Potash, PharmD, BCPS Pager:  [phone] 04/12/2014, 6:09 PM

## 2014-04-12 NOTE — ED Provider Notes (Signed)
Medical screening examination/treatment/procedure(s) were conducted as a shared visit with non-physician practitioner(s) and myself.  I personally evaluated the patient during the encounter.   EKG Interpretation   Date/Time:  Wednesday April 12 2014 08:54:47 EDT Ventricular Rate:  116 PR Interval:  165 QRS Duration: 108 QT Interval:  337 QTC Calculation: 468 R Axis:   -61 Text Interpretation:  Sinus tachycardia Biatrial enlargement Left anterior  fascicular block LVH with secondary repolarization abnormality Peaked T  waves - new Reconfirmed by Gwendolyn Grant  MD, Hanadi Stanly ([id number]) on 04/12/2014 11:09:38  AM       Patient with hx of ESRD presents with malaise, SOB, fatigue. Missed dialysis today.  Patient here with some SOB, no CP. No vomiting. Persistently tachypneic, CXR shows mild edema. Will put on BiPap.  Labs show hyperkalemia - given calcium, albuterol.  CRITICAL CARE Performed by: Dagmar Hait   Total critical care time: 30 minutes  Critical care time was exclusive of separately billable procedures and treating other patients.  Critical care was necessary to treat or prevent imminent or life-threatening deterioration.  Critical care was time spent personally by me on the following activities: development of treatment plan with patient and/or surrogate as well as nursing, discussions with consultants, evaluation of patient's response to treatment, examination of patient, obtaining history from patient or surrogate, ordering and performing treatments and interventions, ordering and review of laboratory studies, ordering and review of radiographic studies, pulse oximetry and re-evaluation of patient's condition.  Nephrology contacted for urgent dialysis. Medicine admitting.  Dagmar Hait, MD 04/12/14 [phone]

## 2014-04-12 NOTE — Progress Notes (Signed)
Dr. Sung Amabile from [facility] responded and is assessing pt.  Orders received to start cardizem bolus.   Will continue to monitor pt closely.

## 2014-04-12 NOTE — Progress Notes (Signed)
Pt arrived from hemodialysis.  Heart rate 180-190s.  E-link notified, Dr. Sung Amabile to camera in.  Will continue to monitor pt closely.

## 2014-04-12 NOTE — ED Provider Notes (Signed)
CSN: [id number]     Arrival date & time 04/12/14  0846 History   First MD Initiated Contact with Patient 04/12/14 [phone]     Chief Complaint  Patient presents with  . Shortness of Breath  . Dizziness     (Consider location/radiation/quality/duration/timing/severity/associated sxs/prior Treatment) Patient is a 58 y.o. male presenting with shortness of breath and dizziness. The history is provided by the patient. No language interpreter was used.  Shortness of Breath Severity:  Moderate Onset quality:  Gradual Associated symptoms: cough   Associated symptoms: no abdominal pain, no chest pain, no fever and no vomiting   Associated symptoms comment:  "I feel bad" for the past several days. He complains of feeling weak, SOB with cough. He denies vomiting, known fever, any chest pain. He has a history of ESRD-HD and missed his dialysis appointment yesterday because of his current condition.  Dizziness Associated symptoms: shortness of breath   Associated symptoms: no chest pain and no vomiting     Past Medical History  Diagnosis Date  . COPD (chronic obstructive pulmonary disease)   . Hypertension   . Hep C w/ coma, chronic   . Irregular heartbeat   . ESRD (end stage renal disease) on dialysis     /notes 11/11/2013  . Smoker unmotivated to quit   . Active smoker   . PTSD (post-traumatic stress disorder)   . Shortness of breath    Past Surgical History  Procedure Laterality Date  . Paracentesis  ~ 10/2013    [name] 11/11/2013   Family History  Problem Relation Age of Onset  . Heart disease Mother   . Hypertension Mother   . Diabetes Mother    History  Substance Use Topics  . Smoking status: Current Every Day Smoker -- 0.50 packs/day for 15 years    Types: Cigarettes  . Smokeless tobacco: Never Used  . Alcohol Use: No     Comment: DENIES (clean 6 mo) Becomes agitated when asked about EtOH use    Review of Systems  Constitutional: Negative for fever and chills.  HENT:  Negative.   Respiratory: Positive for cough and shortness of breath.   Cardiovascular: Negative.  Negative for chest pain.  Gastrointestinal: Negative.  Negative for vomiting and abdominal pain.  Musculoskeletal: Positive for myalgias.  Skin: Negative.   Neurological: Positive for dizziness and weakness.      Allergies  Review of patient's allergies indicates no known allergies.  Home Medications   Prior to Admission medications   Not on File   BP 154/98  Pulse 115  Temp(Src) 97.4 F (36.3 C) (Oral)  Resp 33  SpO2 100% Physical Exam  Constitutional: He is oriented to person, place, and time. He appears well-developed and well-nourished.  Cachectic male, in moderate distress.   HENT:  Head: Normocephalic.  Dry oral mucosa.  Eyes: Conjunctivae are normal.  Neck: Normal range of motion. Neck supple.  Cardiovascular: Regular rhythm.  Tachycardia present.   Pulmonary/Chest: Tachypnea noted. He has rales.  Moderate respiratory distress, sitting upright, unable to recline.  Abdominal: Soft. Bowel sounds are normal. There is no tenderness. There is no rebound and no guarding.  Musculoskeletal: Normal range of motion.  Neurological: He is alert and oriented to person, place, and time.  Skin: Skin is warm and dry. No rash noted.  Psychiatric: He has a normal mood and affect.    ED Course  Procedures (including critical care time) Labs Review Labs Reviewed  CBC  BASIC METABOLIC PANEL  PRO B NATRIURETIC PEPTIDE  URINE RAPID DRUG SCREEN (HOSP PERFORMED)  AMMONIA  LACTIC ACID, PLASMA  HEPATIC FUNCTION PANEL  I-STAT TROPOININ, ED   Results for orders placed during the hospital encounter of 04/12/14  CBC      Result Value Ref Range   WBC 19.2 (*) 4.0 - 10.5 K/uL   RBC 4.79  4.22 - 5.81 MIL/uL   Hemoglobin 12.7 (*) 13.0 - 17.0 g/dL   HCT 81.137.0 (*) 91.439.0 - 78.252.0 %   MCV 77.2 (*) 78.0 - 100.0 fL   MCH 26.5  26.0 - 34.0 pg   MCHC 34.3  30.0 - 36.0 g/dL   RDW 95.615.3  21.311.5 -  08.615.5 %   Platelets 106 (*) 150 - 400 K/uL  BASIC METABOLIC PANEL      Result Value Ref Range   Sodium 136 (*) 137 - 147 mEq/L   Potassium >7.7 (*) 3.7 - 5.3 mEq/L   Chloride 85 (*) 96 - 112 mEq/L   CO2 12 (*) 19 - 32 mEq/L   Glucose, Bld 61 (*) 70 - 99 mg/dL   BUN 92 (*) 6 - 23 mg/dL   Creatinine, Ser 57.8414.20 (*) 0.50 - 1.35 mg/dL   Calcium 69.610.2  8.4 - 29.510.5 mg/dL   GFR calc non Af Amer 3 (*) >90 mL/min   GFR calc Af Amer 4 (*) >90 mL/min  I-STAT TROPOININ, ED      Result Value Ref Range   Troponin i, poc 0.49 (*) 0.00 - 0.08 ng/mL   Comment NOTIFIED PHYSICIAN     Comment 3             Imaging Review Dg Chest Port 1 View  04/12/2014   CLINICAL DATA:  Shortness of Breath  EXAM: PORTABLE CHEST - 1 VIEW  COMPARISON:  January 14, 2014  FINDINGS: There is no edema or consolidation. Heart is enlarged with a degree of pulmonary venous hypertension. No adenopathy. Evidence of prior right clavicle fracture.  IMPRESSION: Cardiomegaly with pulmonary venous hypertension. Suspect early volume overload. No frank edema or consolidation, however.   Electronically Signed   By: Bretta BangWilliam  Woodruff M.D.   On: 04/12/2014 09:26     EKG Interpretation   Date/Time:  Wednesday April 12 2014 08:54:47 EDT Ventricular Rate:  116 PR Interval:  165 QRS Duration: 108 QT Interval:  337 QTC Calculation: 468 R Axis:   -61 Text Interpretation:  Sinus tachycardia Biatrial enlargement Left anterior  fascicular block LVH with secondary repolarization abnormality Similar to  prior Confirmed by Gwendolyn GrantWALDEN  MD, BLAIR ([id number]) on 04/12/2014 9:02:56 AM      MDM   Final diagnoses:  None    1. Hyperkalemia 2. respiratory distress 3. History ESRD 4. Medical noncompliance  He is maintaining oxygen saturation with 4L O2 by nasal cannula, but remains tachypneic, tachycardic and appears uncomfortable. EKG with peaked t-wave, consistent with K+ of 7.7 - no repeat drawn. Nephrology paged. Minimal improvement with Albuterol  nebulizer. BiPap ordered. Urgent dialysis needed/planned.   Dr. Gwendolyn GrantWalden has seen the patient.   Per pharmacy tech, the patient reports not having had any discharge medications from his previous admission (march, 2015) filled, therefore, is assumed that he is taking no current medications.     Arnoldo HookerShari A Margarie Mcguirt, PA-C 04/12/14 1114

## 2014-04-13 ENCOUNTER — Inpatient Hospital Stay (HOSPITAL_COMMUNITY): Payer: Medicare Other

## 2014-04-13 DIAGNOSIS — I4891 Unspecified atrial fibrillation: Secondary | ICD-10-CM

## 2014-04-13 DIAGNOSIS — I10 Essential (primary) hypertension: Secondary | ICD-10-CM

## 2014-04-13 DIAGNOSIS — J9600 Acute respiratory failure, unspecified whether with hypoxia or hypercapnia: Secondary | ICD-10-CM

## 2014-04-13 DIAGNOSIS — A419 Sepsis, unspecified organism: Secondary | ICD-10-CM

## 2014-04-13 DIAGNOSIS — K7460 Unspecified cirrhosis of liver: Secondary | ICD-10-CM

## 2014-04-13 DIAGNOSIS — R799 Abnormal finding of blood chemistry, unspecified: Secondary | ICD-10-CM

## 2014-04-13 DIAGNOSIS — I509 Heart failure, unspecified: Secondary | ICD-10-CM

## 2014-04-13 DIAGNOSIS — R778 Other specified abnormalities of plasma proteins: Secondary | ICD-10-CM | POA: Diagnosis present

## 2014-04-13 DIAGNOSIS — R6520 Severe sepsis without septic shock: Secondary | ICD-10-CM

## 2014-04-13 DIAGNOSIS — I369 Nonrheumatic tricuspid valve disorder, unspecified: Secondary | ICD-10-CM

## 2014-04-13 DIAGNOSIS — E875 Hyperkalemia: Secondary | ICD-10-CM

## 2014-04-13 DIAGNOSIS — I959 Hypotension, unspecified: Secondary | ICD-10-CM

## 2014-04-13 DIAGNOSIS — Z992 Dependence on renal dialysis: Secondary | ICD-10-CM

## 2014-04-13 DIAGNOSIS — R7989 Other specified abnormal findings of blood chemistry: Secondary | ICD-10-CM

## 2014-04-13 DIAGNOSIS — R188 Other ascites: Secondary | ICD-10-CM

## 2014-04-13 DIAGNOSIS — B182 Chronic viral hepatitis C: Secondary | ICD-10-CM

## 2014-04-13 DIAGNOSIS — N186 End stage renal disease: Secondary | ICD-10-CM

## 2014-04-13 LAB — COMPREHENSIVE METABOLIC PANEL
ALBUMIN: 3.5 g/dL (ref 3.5–5.2)
ALT: 69 U/L — ABNORMAL HIGH (ref 0–53)
AST: 120 U/L — AB (ref 0–37)
Alkaline Phosphatase: 79 U/L (ref 39–117)
BILIRUBIN TOTAL: 1.7 mg/dL — AB (ref 0.3–1.2)
BUN: 45 mg/dL — ABNORMAL HIGH (ref 6–23)
CALCIUM: 9.8 mg/dL (ref 8.4–10.5)
CO2: 20 mEq/L (ref 19–32)
CREATININE: 7.22 mg/dL — AB (ref 0.50–1.35)
Chloride: 91 mEq/L — ABNORMAL LOW (ref 96–112)
GFR calc Af Amer: 8 mL/min — ABNORMAL LOW (ref >90)
GFR calc non Af Amer: 7 mL/min — ABNORMAL LOW (ref >90)
Glucose, Bld: 102 mg/dL — ABNORMAL HIGH (ref 70–99)
Potassium: 4.3 mEq/L (ref 3.7–5.3)
Sodium: 137 mEq/L (ref 137–147)
TOTAL PROTEIN: 8.9 g/dL — AB (ref 6.0–8.3)

## 2014-04-13 LAB — AMMONIA: Ammonia: 31 umol/L (ref 11–60)

## 2014-04-13 LAB — POCT I-STAT 3, ART BLOOD GAS (G3+)
Acid-base deficit: 3 mmol/L — ABNORMAL HIGH (ref 0.0–2.0)
Bicarbonate: 19.9 mEq/L — ABNORMAL LOW (ref 20.0–24.0)
O2 Saturation: 97 %
PH ART: 7.456 — AB (ref 7.350–7.450)
TCO2: 21 mmol/L (ref 0–100)
pCO2 arterial: 28.2 mmHg — ABNORMAL LOW (ref 35.0–45.0)
pO2, Arterial: 81 mmHg (ref 80.0–100.0)

## 2014-04-13 LAB — LACTIC ACID, PLASMA
LACTIC ACID, VENOUS: 6.2 mmol/L — AB (ref 0.5–2.2)
Lactic Acid, Venous: 3 mmol/L — ABNORMAL HIGH (ref 0.5–2.2)
Lactic Acid, Venous: 2.3 mmol/L — ABNORMAL HIGH (ref 0.5–2.2)

## 2014-04-13 LAB — CBC
HEMATOCRIT: 39.4 % (ref 39.0–52.0)
HEMOGLOBIN: 13.3 g/dL (ref 13.0–17.0)
MCH: 26 pg (ref 26.0–34.0)
MCHC: 33.8 g/dL (ref 30.0–36.0)
MCV: 77.1 fL — AB (ref 78.0–100.0)
Platelets: 135 10*3/uL — ABNORMAL LOW (ref 150–400)
RBC: 5.11 MIL/uL (ref 4.22–5.81)
RDW: 15.7 % — ABNORMAL HIGH (ref 11.5–15.5)
WBC: 15.4 10*3/uL — ABNORMAL HIGH (ref 4.0–10.5)

## 2014-04-13 LAB — BASIC METABOLIC PANEL
BUN: 60 mg/dL — ABNORMAL HIGH (ref 6–23)
CHLORIDE: 89 meq/L — AB (ref 96–112)
CO2: 19 mEq/L (ref 19–32)
Calcium: 8.9 mg/dL (ref 8.4–10.5)
Creatinine, Ser: 7.92 mg/dL — ABNORMAL HIGH (ref 0.50–1.35)
GFR calc Af Amer: 8 mL/min — ABNORMAL LOW (ref >90)
GFR, EST NON AFRICAN AMERICAN: 7 mL/min — AB (ref >90)
GLUCOSE: 112 mg/dL — AB (ref 70–99)
POTASSIUM: 3.4 meq/L — AB (ref 3.7–5.3)
Sodium: 138 mEq/L (ref 137–147)

## 2014-04-13 LAB — GLUCOSE, CAPILLARY
GLUCOSE-CAPILLARY: 141 mg/dL — AB (ref 70–99)
Glucose-Capillary: 86 mg/dL (ref 70–99)

## 2014-04-13 LAB — TSH: TSH: 1.75 u[IU]/mL (ref 0.350–4.500)

## 2014-04-13 LAB — OCCULT BLOOD X 1 CARD TO LAB, STOOL: Fecal Occult Bld: POSITIVE — AB

## 2014-04-13 LAB — TROPONIN I
Troponin I: 0.63 ng/mL (ref <0.30)
Troponin I: 0.51 ng/mL

## 2014-04-13 LAB — CLOSTRIDIUM DIFFICILE BY PCR: Toxigenic C. Difficile by PCR: POSITIVE — AB

## 2014-04-13 LAB — PREALBUMIN: Prealbumin: 10.5 mg/dL — ABNORMAL LOW (ref 17.0–34.0)

## 2014-04-13 LAB — ALBUMIN: Albumin: 3.5 g/dL (ref 3.5–5.2)

## 2014-04-13 MED ORDER — SODIUM CHLORIDE 0.9 % IV BOLUS (SEPSIS)
500.0000 mL | Freq: Once | INTRAVENOUS | Status: AC
  Administered 2014-04-13: 500 mL via INTRAVENOUS

## 2014-04-13 MED ORDER — MIDAZOLAM HCL 2 MG/2ML IJ SOLN
0.5000 mg | Freq: Once | INTRAMUSCULAR | Status: AC
  Administered 2014-04-13: 0.5 mg via INTRAVENOUS

## 2014-04-13 MED ORDER — AMIODARONE HCL IN DEXTROSE 360-4.14 MG/200ML-% IV SOLN
60.0000 mg/h | INTRAVENOUS | Status: AC
  Administered 2014-04-13: 60 mg/h via INTRAVENOUS
  Filled 2014-04-13 (×2): qty 200

## 2014-04-13 MED ORDER — TECHNETIUM TO 99M ALBUMIN AGGREGATED
6.0000 | Freq: Once | INTRAVENOUS | Status: AC | PRN
  Administered 2014-04-13: 6 via INTRAVENOUS

## 2014-04-13 MED ORDER — DEXTROSE 5 % IV SOLN
30.0000 ug/min | INTRAVENOUS | Status: DC
  Administered 2014-04-13: 100 ug/min via INTRAVENOUS
  Administered 2014-04-13: 30 ug/min via INTRAVENOUS
  Administered 2014-04-14: 125 ug/min via INTRAVENOUS
  Administered 2014-04-14: 50 ug/min via INTRAVENOUS
  Filled 2014-04-13 (×6): qty 4

## 2014-04-13 MED ORDER — VANCOMYCIN 50 MG/ML ORAL SOLUTION
500.0000 mg | Freq: Four times a day (QID) | ORAL | Status: DC
  Administered 2014-04-13 – 2014-04-17 (×17): 500 mg via ORAL
  Filled 2014-04-13 (×20): qty 10

## 2014-04-13 MED ORDER — METRONIDAZOLE IN NACL 5-0.79 MG/ML-% IV SOLN
500.0000 mg | Freq: Three times a day (TID) | INTRAVENOUS | Status: DC
  Administered 2014-04-13 – 2014-04-17 (×12): 500 mg via INTRAVENOUS
  Filled 2014-04-13 (×14): qty 100

## 2014-04-13 MED ORDER — AMIODARONE HCL IN DEXTROSE 360-4.14 MG/200ML-% IV SOLN
30.0000 mg/h | INTRAVENOUS | Status: DC
  Administered 2014-04-13: 60 mg/h via INTRAVENOUS
  Administered 2014-04-14 (×3): 30 mg/h via INTRAVENOUS
  Filled 2014-04-13 (×8): qty 200

## 2014-04-13 MED ORDER — ASPIRIN 81 MG PO CHEW
324.0000 mg | CHEWABLE_TABLET | Freq: Every day | ORAL | Status: DC
  Administered 2014-04-13 – 2014-04-16 (×4): 324 mg via ORAL
  Filled 2014-04-13 (×4): qty 4

## 2014-04-13 MED ORDER — ALBUMIN HUMAN 25 % IV SOLN
50.0000 g | Freq: Once | INTRAVENOUS | Status: AC
  Administered 2014-04-13: 50 g via INTRAVENOUS
  Filled 2014-04-13: qty 200

## 2014-04-13 MED ORDER — MIDAZOLAM HCL 2 MG/2ML IJ SOLN
INTRAMUSCULAR | Status: AC
Start: 2014-04-13 — End: 2014-04-13
  Administered 2014-04-13: 0.5 mg via INTRAVENOUS
  Filled 2014-04-13: qty 2

## 2014-04-13 MED ORDER — AMIODARONE LOAD VIA INFUSION
150.0000 mg | Freq: Once | INTRAVENOUS | Status: AC
  Administered 2014-04-13: 150 mg via INTRAVENOUS
  Filled 2014-04-13: qty 83.34

## 2014-04-13 NOTE — Progress Notes (Signed)
Patient refused BIPAP at this time. Not in room.  No distress noted.

## 2014-04-13 NOTE — Progress Notes (Signed)
Echocardiogram 2D Echocardiogram has been performed.  Carl Benson 04/13/2014, 10:12 AM

## 2014-04-13 NOTE — Progress Notes (Signed)
Dr. Daleen Squibb notified of low blood pressures and cardizem drip decreased to 5mg /hr.  Order for 500cc fluid bolus received.  Will continue to monitor pt closely.

## 2014-04-13 NOTE — Procedures (Signed)
Central Venous Catheter Insertion Procedure Note Carl Benson [id number] June 10, 1956  Procedure: Insertion of Central Venous Catheter Indications: Assessment of intravascular volume, Drug and/or fluid administration and Frequent blood sampling  Procedure Details Consent: Unable to obtain consent because of emergent medical necessity. Time Out: Verified patient identification, verified procedure, site/side was marked, verified correct patient position, special equipment/implants available, medications/allergies/relevent history reviewed, required imaging and test results available.  Performed  Maximum sterile technique was used including antiseptics, cap, gloves, gown, hand hygiene, mask and sheet. Skin prep: Chlorhexidine; local anesthetic administered A antimicrobial bonded/coated triple lumen catheter was placed in the left internal jugular vein using the Seldinger technique. Ultrasound guidance used.yes Catheter placed to 19 cm. Blood aspirated via all 3 ports and then flushed x 3. Line sutured x 2 and dressing applied.  Evaluation Blood flow good Complications: No apparent complications Patient did tolerate procedure well. Chest X-ray ordered to verify placement.  CXR: pending.  Carl Benson, ACNP [facility] Pulmonology/Critical Care Pager [phone] or [phone]  Carl Benson Access Carl Benson. Carl Alias, MD, FACP Pgr: [phone] [place] [facility]

## 2014-04-13 NOTE — Care Management Note (Signed)
    Page 1 of 1   04/13/2014     9:05:14 AM CARE MANAGEMENT NOTE 04/13/2014  Patient:  CHAYSE, CARMACK   Account Number:  [id number]  Date Initiated:  04/13/2014  Documentation initiated by:  Junius Creamer  Subjective/Objective Assessment:   adm w hyperkalemia     Action/Plan:   lives alone   Anticipated DC Date:     Anticipated DC Plan:           Choice offered to / List presented to:             Status of service:   Medicare Important Message given?   (If response is "NO", the following Medicare IM given date fields will be blank) Date Medicare IM given:   Date Additional Medicare IM given:    Discharge Disposition:    Per UR Regulation:  Reviewed for med. necessity/level of care/duration of stay  If discussed at Long Length of Stay Meetings, dates discussed:    Comments:

## 2014-04-13 NOTE — Progress Notes (Signed)
[facility] - Stepdown/ICU TEAM Progress Note  [name] ZOX:[id number] DOB: 04-23-1956 DOA: 04/12/2014 PCP: No PCP Per Patient  Admit HPI / Brief Narrative: Carl [facility] is a 58 y.o BM PMHx hypertension, COPD, end-stage renal disease on dialysis on  HD T/Th/Sat, paroxysmal atrial fibrillation, noncompliance, who was in his usual state of health till sometime yesterday when he started feeling shortness of breath. Patient is currently on BiPAP and is quite tachypneic. Hence, history could not be obtained from him. He, however, denied any chest pain. He missed his dialysis on Tuesday. He denies any nausea, vomiting. History is limited.   HPI/Subjective: 6/3 Patient lethargic, awake with mild stimulation, however is somewhat confused when asked simple questions such as what CODE STATUS would he like to have set in the computer, who is available to make medical decisions for you. After normal saline 500 mL x1 bolus patient's MAP still remains borderline.  Assessment/Plan:  Severe Sepsis/CAP/acute respiratory failure -Source unclear, however PCXR reveals an opacification RML which was not present on CXR Benson/30/2015 -Blood cultures pending -Normal saline 500 ml bolus x2 -Albumin IV 50 g x1 -Obtain respiratory viral panel -Sputum culture will be obtained -Lactic acid on admission= 8.2, current lactic acid= 6.2  CHF -On admission patient with water balloon heart by CXR, no echocardiogram found in Epic, record -Stat echocardiogram ordered -Troponin x3 showed patient had a high of 0.63, starting to trend down -On admission patient's proBNP 70K  Atrial fibrillation with RVR -Cardizem drip started -Metoprolol held secondary to patient's soft BP   Hypotension -Most likely multifactorial removal of 3.5 L by HD, metoprolol administered at 2306, CAP?, Noncompliance, malnutrition.  -Bolus normal saline 500 ml x2, albumin 50 g x1 -Have spoken with PCCM  Dr. Delaney Meigs who will  evaluate for placement in the ICU secondary to possible pressor requirement; ADDENDUM PCCM has assumed care    ESRD on HD T/Th/Sat -Patient missed to HD sessions  -Admitted with heart failure/pulmonary edema was taken for emergent dialysis on 6/3 where a 3.5 L were removed   Chronic Hepatitis C -Previous Treatment?  Abnormal troponin -Patient has been elevated troponin most likely secondary to demand ischemia and ESRD. Patient recently high of 0.63 and is beginning to trend down current troponin= 0.51 -Will continue to trend troponin  History substance abuse -Obtain serum drug screen    Code Status: FULL Family Communication: no family present at time of exam Disposition Plan: Resolution sepsis   Consultants: Dr. Delaney Meigs (PCCM)   Procedure/Significant Events:    Culture 6/3 MRSA by PCR negative 6/3 blood pending 6/4 GI pathogen by PCR pending 6/4 positive for C. difficile   Antibiotics: Vancomycin 6/3 (T/TH/Sat with HD)>> Cefepime 6/3 (T/TH/Sat with HD) >> Vancomycin PO 6/4 >> Flagyl PO 6/4>>  DVT prophylaxis: Heparin   Devices None   LINES / TUBES:  6/3  20ga left antecubital  6/3 left medial forearm  6/4 left radial arterial line     Continuous Infusions: . diltiazem (CARDIZEM) infusion 5 mg/hr (04/13/14 0800)    Objective: VITAL SIGNS: Temp: 97.6 F (36.4 C) (06/04 0816) Temp src: Axillary (06/04 0816) BP: 71/45 mmHg (06/04 0334) Pulse Rate: 92 (06/04 0800) SPO2; 95% room air FIO2:   Intake/Output Summary (Last 24 hours) at 04/13/14 0841 Last data filed at 04/13/14 0800  Gross per 24 hour  Intake 276.75 ml  Output   4000 ml  Net -3723.25 ml     Exam: General: A./O. x2 (  does not know where/why)), moderate distress secondary to A. fib RVR with hypertension No acute respiratory distress Lungs: Bibasilar rales, negative wheezes or crackles Cardiovascular: Irregular irregular rhythm and rate,  Heart rate currently 140s on  Cardizem drip 20 without murmur gallop or rub normal S1 and S2 Abdomen: Soft, tender diffusely to palpation, bowel sounds positive, no rebound, no ascites, no appreciable mass Extremities: No significant cyanosis, clubbing, or edema bilateral lower extremities  Data Reviewed: Basic Metabolic Panel:  Recent Labs Lab 04/12/14 0923 04/12/14 1843 04/13/14 0105  NA 136*  --  137  K >7.7* 3.4* 4.3  CL 85*  --  91*  CO2 12*  --  20  GLUCOSE 61*  --  102*  BUN 92*  --  45*  CREATININE 14.20*  --  7.22*  CALCIUM 10.2  --  9.8   Liver Function Tests:  Recent Labs Lab 04/12/14 1123 04/13/14 0105  AST 68* 120*  ALT 47 69*  ALKPHOS 112 79  BILITOT Benson.5* Benson.7*  PROT 9.5* 8.9*  ALBUMIN 4.2 3.5   No results found for this basename: LIPASE, AMYLASE,  in the last 168 hours  Recent Labs Lab 04/12/14 1843  AMMONIA 45   CBC:  Recent Labs Lab 04/12/14 0923 04/13/14 0105  WBC 19.2* 15.4*  HGB 12.7* 13.3  HCT 37.0* 39.4  MCV 77.2* 77.Benson*  PLT 106* 135*   Cardiac Enzymes:  Recent Labs Lab 04/12/14 1843 04/13/14 0105 04/13/14 0640  TROPONINI 0.61* 0.63* 0.51*   BNP (last 3 results)  Recent Labs  01/06/14 2025 01/14/14 0826 04/12/14 1123  PROBNP >70000.0* >70000.0* >70000.0*   CBG:  Recent Labs Lab 04/12/14 1151 04/12/14 1801 04/12/14 2313  GLUCAP 84 85 97    Recent Results (from the past 240 hour(s))  CULTURE, BLOOD (ROUTINE X 2)     Status: None   Collection Time    04/12/14  4:35 PM      Result Value Ref Range Status   Specimen Description BLOOD   Final   Special Requests     Final   Value: BOTTLES DRAWN AEROBIC AND ANAEROBIC 10CC DIALYSIS AVG   Culture  Setup Time     Final   Value: 04/12/2014 18:55     Performed at [facility]   Culture     Final   Value:        BLOOD CULTURE RECEIVED NO GROWTH TO DATE CULTURE WILL BE HELD FOR 5 DAYS BEFORE ISSUING A FINAL NEGATIVE REPORT     Performed at [facility]   Report Status PENDING    Incomplete  MRSA PCR SCREENING     Status: None   Collection Time    04/12/14  5:41 PM      Result Value Ref Range Status   MRSA by PCR NEGATIVE  NEGATIVE Final   Comment:            The GeneXpert MRSA Assay (FDA     approved for NASAL specimens     only), is one component of a     comprehensive MRSA colonization     surveillance program. It is not     intended to diagnose MRSA     infection nor to guide or     monitor treatment for     MRSA infections.     Studies:  Recent x-ray studies have been reviewed in detail by the Attending Physician  Scheduled Meds:  Scheduled Meds: . sodium chloride  Intravenous STAT  . antiseptic oral rinse  15 mL Mouth Rinse BID  . aspirin EC  81 mg Oral Daily  . calcium gluconate  Benson g Intravenous STAT  . ceFEPime (MAXIPIME) IV  2 g Intravenous Q T,Th,Sa-HD  . darbepoetin (ARANESP) injection - DIALYSIS  25 mcg Intravenous Q14 Days  . [START ON 04/14/2014] doxercalciferol  Benson mcg Intravenous Q M,W,F-HD  . [START ON 04/19/2014] ferric gluconate (FERRLECIT/NULECIT) IV  62.5 mg Intravenous Q Wed-HD  . heparin  3,000 Units Dialysis Once in dialysis  . heparin  5,000 Units Subcutaneous 3 times per day  . metoprolol tartrate  25 mg Oral BID  . sodium chloride  3 mL Intravenous Q12H  . sodium chloride  3 mL Intravenous Q12H  . vancomycin  500 mg Intravenous Q T,Th,Sa-HD    Time spent on care of this patient: 40 mins   Drema Dallas , MD   [facility] Office  [phone] Pager - [phone]  On-Call/Text Page:      [email or web address]      password [email or web address]  If 7PM-7AM, please contact night-coverage [email or web address] Password [email or web address] 04/13/2014, 8:41 AM   LOS: Benson day

## 2014-04-13 NOTE — Procedures (Signed)
Arterial Catheter Insertion Procedure Note DAMAREA BRUCE [id number] October 24, 1956  Procedure: Insertion of Arterial Catheter  Indications: Blood pressure monitoring  Procedure Details Consent: Risks of procedure as well as the alternatives and risks of each were explained to the (patient/caregiver).  Consent for procedure obtained. Time Out: Verified patient identification, verified procedure, site/side was marked, verified correct patient position, special equipment/implants available, medications/allergies/relevent history reviewed, required imaging and test results available.  Performed  Maximum sterile technique was used including antiseptics, cap, gloves, gown, hand hygiene, mask and sheet. Skin prep: Chlorhexidine; local anesthetic administered 20 gauge catheter was inserted into left radial artery using the Seldinger technique.  Evaluation Blood flow good; BP tracing good. Complications: No apparent complications.   Audelia Hives 04/13/2014

## 2014-04-13 NOTE — Progress Notes (Signed)
Patient was sourced for blood exposure to staff member, per hospital policy. Blood was drawn for exposure panel.

## 2014-04-13 NOTE — Consult Note (Signed)
PULMONARY / CRITICAL CARE MEDICINE   Name: Carl Benson A Lamping MRN: [id number] DOB: August 11, 1956    ADMISSION DATE:  04/12/2014 CONSULTATION DATE:  04/13/2014  [facility]: [facility]M  CHIEF COMPLAINT:  SOB  BRIEF PATIENT DESCRIPTION: 58 year old male with ESRD on HD (TTS), COPD, and PAF. Presented to ED 6/3 with c/o SOB x 1 day. In ED placed on BiPAP, and it was found that he had missed dialysis on 6/2. 6/3 PM troponin was found to be elevated with fevers, hypotension, and AF RVR. Was moved to ICU. PCCM asked to see.   SIGNIFICANT EVENTS / STUDIES:  6/3 - admitted, HD 3.7 lit off 6/4- fib rvr, shock 6/4 cdiff pos, stool sincreased  LINES / TUBES: LIJ CVL 6/4 >>> Art Line L rad 6/3 >>> RUE HD fistula ? >>>  CULTURES: Blood 6/3 >>> Urine 6/3 >>> C-dif 6/3 > POS Sputum 6/4 >>> Resp Viral 6/4 >>>  ANTIBIOTICS: Vanc 6/3 >>> Flagyl 6/4 >>> Cefepime 6/3 > 64  HISTORY OF PRESENT ILLNESS:   58 year old male with ESRD on HD (TTS), COPD, and PAF. Presented to ED 6/3 with c/o SOB x 1 day. In ED placed on BiPAP, and it was found that he had missed dialysis on 6/2. 6/3 PM troponin was found to be elevated with fevers, hypotension, and AF RVR. He was started on dilt gtt for his RVR. Was moved to ICU. 6/4 am c-diff found to be positive and CXR was suspicious for pericardial effusion. PCCM asked to see. Of note he has not filled any prescriptions since March 2015.   PAST MEDICAL HISTORY :  Past Medical History  Diagnosis Date  . COPD (chronic obstructive pulmonary disease)   . Hypertension   . Hep C w/ coma, chronic   . Irregular heartbeat   . ESRD (end stage renal disease) on dialysis     /notes 11/11/2013  . Smoker unmotivated to quit   . Active smoker   . PTSD (post-traumatic stress disorder)   . Shortness of breath    Past Surgical History  Procedure Laterality Date  . Paracentesis  ~ 10/2013    [name]/notes 11/11/2013   Prior to Admission medications   Not on File   No  Known Allergies  FAMILY HISTORY:  Family History  Problem Relation Age of Onset  . Heart disease Mother   . Hypertension Mother   . Diabetes Mother    SOCIAL HISTORY:  reports that he has been smoking Cigarettes.  He has a 7.5 pack-year smoking history. He has never used smokeless tobacco. He reports that he uses illicit drugs ("Crack" cocaine, Cocaine, Marijuana, and Other-see comments). He reports that he does not drink alcohol.  REVIEW OF SYSTEMS:  Unable in setting of lethargy  SUBJECTIVE:   VITAL SIGNS: Temp:  [97.6 F (36.4 C)-101.7 F (38.7 C)] 97.6 F (36.4 C) (06/04 0816) Pulse Rate:  [61-187] 110 (06/04 0900) Resp:  [25-45] 32 (06/04 0900) BP: (71-177)/(45-117) 71/45 mmHg (06/04 0334) SpO2:  [93 %-100 %] 95 % (06/04 0900) Arterial Line BP: (84-166)/(27-73) 93/27 mmHg (06/04 0900) Weight:  [53.6 kg (118 lb 2.7 oz)-57.6 kg (126 lb 15.8 oz)] 53.6 kg (118 lb 2.7 oz) (06/03 1706) HEMODYNAMICS:   VENTILATOR SETTINGS:   INTAKE / OUTPUT: Intake/Output     06/03 0701 - 06/04 0700 06/04 0701 - 06/05 0700   P.O. 240    I.V. (mL/kg) 28 (0.5) 8.8 (0.2)   IV Piggyback  500   Total Intake(mL/kg) 268 (5) 508.8 (9.5)   Other 4000    Total Output 4000     Net -3732 +508.8        Stool Occurrence 6 x 2 x     PHYSICAL EXAMINATION: General:  Chonically ill  Appearing male in NAD Neuro:  Alert, oriented to person, place.  HEENT:  [place]/AT, No JVD noted Cardiovascular:  Tachy, irreg irreg Lungs:  Scant rhonchi, shallow even resp's.  Abdomen:  Soft, non-disteneded Musculoskeletal:  No acute deformity Skin:  Intact  LABS:  CBC  Recent Labs Lab 04/12/14 0923 04/13/14 0105  WBC 19.2* 15.4*  HGB 12.7* 13.3  HCT 37.0* 39.4  PLT 106* 135*   Coag's No results found for this basename: APTT, INR,  in the last 168 hours BMET  Recent Labs Lab 04/12/14 0923 04/12/14 1843 04/13/14 0105  NA 136*  --  137  K >7.7* 3.4* 4.3  CL 85*  --  91*  CO2 12*  --  20  BUN 92*   --  45*  CREATININE 14.20*  --  7.22*  GLUCOSE 61*  --  102*   Electrolytes  Recent Labs Lab 04/12/14 0923 04/13/14 0105  CALCIUM 10.2 9.8   Sepsis Markers  Recent Labs Lab 04/12/14 1123 04/13/14 0640  LATICACIDVEN 8.2* 6.2*   ABG  Recent Labs Lab 04/13/14 0711  PHART 7.456*  PCO2ART 28.2*  PO2ART 81.0   Liver Enzymes  Recent Labs Lab 04/12/14 1123 04/13/14 0105  AST 68* 120*  ALT 47 69*  ALKPHOS 112 79  BILITOT 1.5* 1.7*  ALBUMIN 4.2 3.5   Cardiac Enzymes  Recent Labs Lab 04/12/14 1123 04/12/14 1843 04/13/14 0105 04/13/14 0640  TROPONINI  --  0.61* 0.63* 0.51*  PROBNP >70000.0*  --   --   --    Glucose  Recent Labs Lab 04/12/14 1151 04/12/14 1801 04/12/14 2313  GLUCAP 84 85 97    Imaging Dg Chest Port 1 View  04/13/2014   CLINICAL DATA:  fever, leukocytosis, resp failure  EXAM: PORTABLE CHEST - 1 VIEW  COMPARISON:  Portable chest 04/12/2014  FINDINGS: Stable cardiomegaly. No focal regions of consolidation or focal infiltrates. Area of discoid atelectasis along the periphery of the left lower lobe. No focal regions of consolidation or focal infiltrates. Minimal blunting of the left costophrenic angle. No acute osseous abnormalities.  IMPRESSION: Discoid atelectasis otherwise stable chest radiograph.   Electronically Signed   By: Salome Holmes M.D.   On: 04/13/2014 07:34   Dg Chest Port 1 View  04/12/2014   CLINICAL DATA:  Shortness of Breath  EXAM: PORTABLE CHEST - 1 VIEW  COMPARISON:  January 14, 2014  FINDINGS: There is no edema or consolidation. Heart is enlarged with a degree of pulmonary venous hypertension. No adenopathy. Evidence of prior right clavicle fracture.  IMPRESSION: Cardiomegaly with pulmonary venous hypertension. Suspect early volume overload. No frank edema or consolidation, however.   Electronically Signed   By: Bretta Bang M.D.   On: 04/12/2014 09:26    ASSESSMENT / PLAN:  PULMONARY A:  Acute on chronic respiratory  failure COPD without evidence of acute exacerbation NOt impressed PNA P:   Supplemental O2 for SpO2 88-92% May require advanced airway for declining mental status.  PRN xopenex Check ABG Follow CXR   CARDIOVASCULAR A:  Shock hypovolemic, likely, septic cdiff A fib/flutter with RVR - failed dilt gtt Likely pericardial effusion PAF HTN  P:  Start amiodarone bolus and drip  Neo to keep MAP greater than 15mm/Hg, may need addition vaso Avoid levo if able TTE to assess for effusion Insert CVL Trend CVP Trend troponin, lactic acid Gentle IVF hydration as ESRD with possible effusion, use cvp Cortisol tsh cardizem to wean off as amio started if able  RENAL A:   ESRD on HD  P:   Renal following  No indications for emergent HD at this time.  Follow BMP  GASTROINTESTINAL A:  C-dif colitis Hepatitis C - chronic  P:   C-dif management per ID section Follow LFT/ammonia PPI for GI ppx  HEMATOLOGIC A:   Leukocytosis VTE ppx  P:  Heparin Follow CBC  INFECTIOUS A:   ? Severe Sepsis hard to determine in setting of acute cardiovascular changes C-dif colitis  P:   D/c cefepime Change vanc to PO and higher dose Start flagyl Trend WBC and fever curve.  Follow cultures Avoid empiric abx as able  ENDOCRINE A:   No active issues  P:   Check CBG while NPO  NEUROLOGIC A:   Acute encephalopathy  P:   Monitor ammonia   Joneen Roach, ACNP [facility] Pager [phone] or [phone]   I have personally obtained a history, examined the patient, evaluated laboratory and imaging results, formulated the assessment and plan and placed orders. CRITICAL CARE: The patient is critically ill with multiple organ systems failure and requires high complexity decision making for assessment and support, frequent evaluation and titration of therapies, application of advanced monitoring technologies and extensive interpretation of multiple  databases. Critical Care Time devoted to patient care services described in this note is 40 minutes.   [name]. Tyson Alias, MD, FACP Pgr: [phone]  Pulmonary & Critical Care

## 2014-04-13 NOTE — Progress Notes (Signed)
[facility] Progress Note   Subjective: Afib w RVR overnight, treated with IV dilt, today BP dropped into 70's, moved to ICU, putting central line in now, getting NS bolus. Pt lethargic  Filed Vitals:   04/13/14 0816 04/13/14 0830 04/13/14 0845 04/13/14 0900  BP:      Pulse:  115 118 110  Temp: 97.6 F (36.4 C)     TempSrc: Axillary     Resp:  27 30 32  Height:      Weight:      SpO2:  97% 97% 95%   Exam: Lying flat , lethargic, arouses ok No jvd Chest clear ant and lat Irreg, tachy, no rub or gallop Abd nondistended, +BS  No LE or UE edema, skin turgor poor  Neuro sluggish, moves extremities to stimulus RUA AVG is patent   HD: MWF [place]  3.5h   F160   55kg   2/2.0 [place]   Heparin 3000  RUA AVG  Hect 1 ug tiw   Aranesp 25 ug q other wk (last was supposed to be 6/2 but pt no-showed) Fe 50/wk   Assessment:  1 Hypotension / shock- possible sepsis/vol depletion, per CCM getting fluid bolus now 2 Hyperkalemia- resolved  3 CDif+ - per CCM 4 ESRD on HD  5 COPD 6 Hep C w cirrhosis  7 HTN/vol- in shock now, 2kg under dry wt 8 Hx PTSD  Plan- hold HD for today, prob tomorrow if stable    [name] Mattheo Swindle MD  pager [phone]    cell [phone]  04/13/2014, 10:28 AM     Recent Labs Lab 04/12/14 0923 04/12/14 1843 04/13/14 0105  NA 136*  --  137  K >7.7* 3.4* 4.3  CL 85*  --  91*  CO2 12*  --  20  GLUCOSE 61*  --  102*  BUN 92*  --  45*  CREATININE 14.20*  --  7.22*  CALCIUM 10.2  --  9.8    Recent Labs Lab 04/12/14 1123 04/13/14 0105  AST 68* 120*  ALT 47 69*  ALKPHOS 112 79  BILITOT 1.5* 1.7*  PROT 9.5* 8.9*  ALBUMIN 4.2 3.5    Recent Labs Lab 04/12/14 0923 04/13/14 0105  WBC 19.2* 15.4*  HGB 12.7* 13.3  HCT 37.0* 39.4  MCV 77.2* 77.1*  PLT 106* 135*   . sodium chloride   Intravenous STAT  . amiodarone  150 mg Intravenous Once  . antiseptic oral rinse  15 mL Mouth Rinse BID  . aspirin  324 mg Oral Daily  . calcium gluconate   1 g Intravenous STAT  . ceFEPime (MAXIPIME) IV  2 g Intravenous Q T,Th,Sa-HD  . darbepoetin (ARANESP) injection - DIALYSIS  25 mcg Intravenous Q14 Days  . [START ON 04/14/2014] doxercalciferol  1 mcg Intravenous Q M,W,F-HD  . [START ON 04/19/2014] ferric gluconate (FERRLECIT/NULECIT) IV  62.5 mg Intravenous Q Wed-HD  . heparin  3,000 Units Dialysis Once in dialysis  . heparin  5,000 Units Subcutaneous 3 times per day  . vancomycin  500 mg Oral 4 times per day   And  . metronidazole  500 mg Intravenous Q8H  . midazolam      . midazolam  0.5 mg Intravenous Once  . sodium chloride  500 mL Intravenous Once  . sodium chloride  500 mL Intravenous Once  . sodium chloride  3 mL Intravenous Q12H  . sodium chloride  3 mL Intravenous Q12H   . amiodarone  Followed by  . amiodarone    . diltiazem (CARDIZEM) infusion 15 mg/hr (04/13/14 0945)  . phenylephrine (NEO-SYNEPHRINE) Adult infusion     sodium chloride, sodium chloride, sodium chloride, acetaminophen, acetaminophen, feeding supplement (NEPRO CARB STEADY), heparin, levalbuterol, lidocaine (PF), lidocaine-prilocaine, morphine injection, ondansetron (ZOFRAN) IV, ondansetron, oxyCODONE, pentafluoroprop-tetrafluoroeth, sodium chloride

## 2014-04-14 ENCOUNTER — Inpatient Hospital Stay (HOSPITAL_COMMUNITY): Payer: Medicare Other

## 2014-04-14 DIAGNOSIS — B182 Chronic viral hepatitis C: Secondary | ICD-10-CM

## 2014-04-14 DIAGNOSIS — J449 Chronic obstructive pulmonary disease, unspecified: Secondary | ICD-10-CM

## 2014-04-14 LAB — HEPATITIS B SURFACE ANTIGEN: Hepatitis B Surface Ag: NEGATIVE

## 2014-04-14 LAB — BASIC METABOLIC PANEL
BUN: 68 mg/dL — AB (ref 6–23)
BUN: 81 mg/dL — ABNORMAL HIGH (ref 6–23)
CALCIUM: 8.8 mg/dL (ref 8.4–10.5)
CHLORIDE: 92 meq/L — AB (ref 96–112)
CO2: 19 meq/L (ref 19–32)
CO2: 19 meq/L (ref 19–32)
Calcium: 8.8 mg/dL (ref 8.4–10.5)
Chloride: 87 mEq/L — ABNORMAL LOW (ref 96–112)
Creatinine, Ser: 8.27 mg/dL — ABNORMAL HIGH (ref 0.50–1.35)
Creatinine, Ser: 8.3 mg/dL — ABNORMAL HIGH (ref 0.50–1.35)
GFR calc Af Amer: 7 mL/min — ABNORMAL LOW (ref >90)
GFR calc non Af Amer: 6 mL/min — ABNORMAL LOW (ref >90)
GFR calc non Af Amer: 6 mL/min — ABNORMAL LOW (ref >90)
GFR, EST AFRICAN AMERICAN: 7 mL/min — AB (ref >90)
Glucose, Bld: 139 mg/dL — ABNORMAL HIGH (ref 70–99)
Glucose, Bld: 185 mg/dL — ABNORMAL HIGH (ref 70–99)
POTASSIUM: 3 meq/L — AB (ref 3.7–5.3)
POTASSIUM: 3.6 meq/L — AB (ref 3.7–5.3)
SODIUM: 132 meq/L — AB (ref 137–147)
Sodium: 137 mEq/L (ref 137–147)

## 2014-04-14 LAB — GI PATHOGEN PANEL BY PCR, STOOL
C DIFFICILE TOXIN A/B: POSITIVE
Campylobacter by PCR: NEGATIVE
Cryptosporidium by PCR: NEGATIVE
E COLI (ETEC) LT/ST: NEGATIVE
E coli (STEC): NEGATIVE
E coli 0157 by PCR: NEGATIVE
G lamblia by PCR: NEGATIVE
Norovirus GI/GII: NEGATIVE
ROTAVIRUS A BY PCR: NEGATIVE
SALMONELLA BY PCR: NEGATIVE
Shigella by PCR: POSITIVE

## 2014-04-14 LAB — GLUCOSE, CAPILLARY
Glucose-Capillary: 122 mg/dL — ABNORMAL HIGH (ref 70–99)
Glucose-Capillary: 117 mg/dL — ABNORMAL HIGH (ref 70–99)
Glucose-Capillary: 116 mg/dL — ABNORMAL HIGH (ref 70–99)

## 2014-04-14 LAB — FECAL LACTOFERRIN, QUANT: FECAL LACTOFERRIN: POSITIVE

## 2014-04-14 LAB — CORTISOL: CORTISOL PLASMA: 91.5 ug/dL

## 2014-04-14 LAB — LACTIC ACID, PLASMA: Lactic Acid, Venous: 2 mmol/L (ref 0.5–2.2)

## 2014-04-14 MED ORDER — LIDOCAINE-PRILOCAINE 2.5-2.5 % EX CREA: 1.0000 "application " | TOPICAL_CREAM | CUTANEOUS | Status: DC | PRN

## 2014-04-14 MED ORDER — DOXERCALCIFEROL 4 MCG/2ML IV SOLN
INTRAVENOUS | Status: AC
  Administered 2014-04-14: 1 ug via INTRAVENOUS
  Filled 2014-04-14: qty 2

## 2014-04-14 MED ORDER — NEPRO/CARBSTEADY PO LIQD: 237.0000 mL | ORAL | Status: DC | PRN

## 2014-04-14 MED ORDER — ALTEPLASE 2 MG IJ SOLR
2.0000 mg | Freq: Once | INTRAMUSCULAR | Status: AC | PRN
  Filled 2014-04-14: qty 2

## 2014-04-14 MED ORDER — HEPARIN SODIUM (PORCINE) 1000 UNIT/ML DIALYSIS
2000.0000 [IU] | Freq: Once | INTRAMUSCULAR | Status: DC
  Administered 2014-04-14: 2000 [IU] via INTRAVENOUS_CENTRAL

## 2014-04-14 MED ORDER — ALTEPLASE 2 MG IJ SOLR: 2.0000 mg | Freq: Once | INTRAMUSCULAR | Status: DC | PRN

## 2014-04-14 MED ORDER — AMIODARONE IV BOLUS ONLY 150 MG/100ML
150.0000 mg | Freq: Once | INTRAVENOUS | Status: AC
  Administered 2014-04-14: 150 mg via INTRAVENOUS
  Filled 2014-04-14: qty 100

## 2014-04-14 MED ORDER — LIDOCAINE HCL (PF) 1 % IJ SOLN: 5.0000 mL | INTRAMUSCULAR | Status: DC | PRN

## 2014-04-14 MED ORDER — SODIUM CHLORIDE 0.9 % IV SOLN: 100.0000 mL | INTRAVENOUS | Status: DC | PRN

## 2014-04-14 MED ORDER — PENTAFLUOROPROP-TETRAFLUOROETH EX AERO: 1.0000 "application " | INHALATION_SPRAY | CUTANEOUS | Status: DC | PRN

## 2014-04-14 MED ORDER — BOOST / RESOURCE BREEZE PO LIQD
1.0000 | Freq: Three times a day (TID) | ORAL | Status: DC
  Administered 2014-04-14 – 2014-04-17 (×11): 1 via ORAL

## 2014-04-14 MED ORDER — HEPARIN SODIUM (PORCINE) 1000 UNIT/ML DIALYSIS: 1000.0000 [IU] | INTRAMUSCULAR | Status: DC | PRN

## 2014-04-14 MED ORDER — HALOPERIDOL LACTATE 5 MG/ML IJ SOLN
5.0000 mg | Freq: Once | INTRAMUSCULAR | Status: AC
  Administered 2014-04-14: 5 mg via INTRAVENOUS

## 2014-04-14 MED ORDER — HALOPERIDOL LACTATE 5 MG/ML IJ SOLN: 5.0000 mg | Freq: Once | INTRAMUSCULAR | Status: DC

## 2014-04-14 MED ORDER — NOREPINEPHRINE BITARTRATE 1 MG/ML IV SOLN
2.0000 ug/min | INTRAVENOUS | Status: DC
  Filled 2014-04-14: qty 4

## 2014-04-14 MED ORDER — HEPARIN SODIUM (PORCINE) 1000 UNIT/ML DIALYSIS: 3000.0000 [IU] | Freq: Once | INTRAMUSCULAR | Status: DC

## 2014-04-14 MED ORDER — NEPRO/CARBSTEADY PO LIQD
237.0000 mL | ORAL | Status: DC | PRN
  Filled 2014-04-14: qty 237

## 2014-04-14 MED ORDER — POTASSIUM CHLORIDE CRYS ER 20 MEQ PO TBCR
20.0000 meq | EXTENDED_RELEASE_TABLET | Freq: Once | ORAL | Status: AC
  Administered 2014-04-14: 20 meq via ORAL
  Filled 2014-04-14: qty 1

## 2014-04-14 MED ORDER — HALOPERIDOL LACTATE 5 MG/ML IJ SOLN
INTRAMUSCULAR | Status: AC
  Filled 2014-04-14: qty 1

## 2014-04-14 NOTE — Progress Notes (Signed)
eLink Physician-Brief Progress Note Patient Name: LEN PRIMAS DOB: June 11, 1956 MRN: [id number]  Date of Service  04/14/2014   HPI/Events of Note  Hypotension with current BP of 86/47 (57).  Is maxed out on NEO gtt.  On amio gtt for AF/RVR.  Has CVP of 5 but is ESRD.     eICU Interventions  Plan: Start NE gtt for BP support with goal MAP of 60 Hold on additional fluid boluses secondary to ESRD   Intervention Category Intermediate Interventions: Hypotension - evaluation and management  Dorise Hiss Deterding 04/14/2014, 4:48 AM

## 2014-04-14 NOTE — Progress Notes (Signed)
PULMONARY / CRITICAL CARE MEDICINE   Name: Carl Benson MRN: [id number] DOB: 1956-06-12    ADMISSION DATE:  04/12/2014 CONSULTATION DATE:  04/13/2014  REFERRING MD :  [facility] PRIMARY SERVICE: PCCM  CHIEF COMPLAINT:  SOB  BRIEF PATIENT DESCRIPTION: 58 year old male with ESRD on HD (TTS), COPD, and PAF. Presented to ED 6/3 with c/o SOB x 1 day. In ED placed on BiPAP, and it was found that he had missed dialysis on 6/2. 6/3 PM troponin was found to be elevated with fevers, hypotension, and AF RVR. Was moved to ICU. PCCM asked to see.   SIGNIFICANT EVENTS / STUDIES:  6/3 - admitted, HD 3.7 lit off 6/4- fib rvr, shock 6/4 cdiff pos, stools increased 6/4 - echo - Mildly dilated left ventricle with severely impaired systolic function, LVEF 20-25%. Moderately dilated right ventricle with moderately decreased function. Moderate tricuspid regurgitation. Mild pulmonary hypertension.  6/5- remains on neo, aggitation  LINES / TUBES: LIJ CVL (difficult placement) 6/4 >>> Art Line L rad 6/3 >>> RUE HD fistula ? >>>  CULTURES: Blood 6/3 >>> Urine 6/3 >>> C-dif 6/3 > POS Sputum 6/4 >>> Resp Viral 6/4 >>>  ANTIBIOTICS: Vanc oral 6/3 >>> Flagyl 6/4 >>> Cefepime 6/3 > 64  HISTORY OF PRESENT ILLNESS:   58 year old male with ESRD on HD (TTS), COPD, and PAF. Presented to ED 6/3 with c/o SOB x 1 day. In ED placed on BiPAP, and it was found that he had missed dialysis on 6/2. 6/3 PM troponin was found to be elevated with fevers, hypotension, and AF RVR. He was started on dilt gtt for his RVR. Was moved to ICU. 6/4 am c-diff found to be positive and CXR was suspicious for pericardial effusion. PCCM asked to see. Of note he has not filled any prescriptions since March 2015.   PAST MEDICAL HISTORY :  Past Medical History  Diagnosis Date  . COPD (chronic obstructive pulmonary disease)   . Hypertension   . Hep C w/ coma, chronic   . Irregular heartbeat   . ESRD (end stage renal disease) on dialysis      /notes 11/11/2013  . Smoker unmotivated to quit   . Active smoker   . PTSD (post-traumatic stress disorder)   . Shortness of breath    Past Surgical History  Procedure Laterality Date  . Paracentesis  ~ 10/2013    [name] 11/11/2013   Prior to Admission medications   Not on File   No Known Allergies  FAMILY HISTORY:  Family History  Problem Relation Age of Onset  . Heart disease Mother   . Hypertension Mother   . Diabetes Mother    SOCIAL HISTORY:  reports that he has been smoking Cigarettes.  He has a 7.5 pack-year smoking history. He has never used smokeless tobacco. He reports that he uses illicit drugs ("Crack" cocaine, Cocaine, Marijuana, and Other-see comments). He reports that he does not drink alcohol.  REVIEW OF SYSTEMS:  Unable in setting of lethargy  SUBJECTIVE:   VITAL SIGNS: Temp:  [97.4 F (36.3 C)-97.8 F (36.6 C)] 97.8 F (36.6 C) (06/05 0800) Pulse Rate:  [32-95] 79 (06/05 0300) Resp:  [14-38] 31 (06/05 0600) BP: (90-103)/(46-68) 100/68 mmHg (06/05 0500) SpO2:  [89 %-100 %] 94 % (06/05 0800) Arterial Line BP: (64-127)/(36-67) 91/59 mmHg (06/05 0600) Weight:  [56.3 kg (124 lb 1.9 oz)] 56.3 kg (124 lb 1.9 oz) (06/05 0300) HEMODYNAMICS: CVP:  [2 mmHg-20 mmHg] 5 mmHg VENTILATOR SETTINGS:  INTAKE / OUTPUT: Intake/Output     06/04 0701 - 06/05 0700 06/05 0701 - 06/06 0700   P.O.     I.V. (mL/kg) 1282.7 (22.8) 226.9 (4)   IV Piggyback 600    Total Intake(mL/kg) 1882.7 (33.4) 226.9 (4)   Other     Total Output       Net +1882.7 +226.9        Stool Occurrence 3 x      PHYSICAL EXAMINATION: General:  Chonically ill  Appearing male in NAD Neuro:  Alert, oriented to person, place.  HEENT:  /AT, No JVD noted Cardiovascular:  s1 s2 RRR Lungs: coarse Abdomen:  Soft, non-disteneded Musculoskeletal:  No acute deformity Skin:  Intact  LABS:  CBC  Recent Labs Lab 04/12/14 0923 04/13/14 0105  WBC 19.2* 15.4*  HGB 12.7* 13.3  HCT 37.0* 39.4   PLT 106* 135*   Coag's No results found for this basename: APTT, INR,  in the last 168 hours BMET  Recent Labs Lab 04/13/14 0105 04/13/14 1145 04/13/14 2330  NA 137 138 137  K 4.3 3.4* 3.6*  CL 91* 89* 92*  CO2 20 19 19   BUN 45* 60* 68*  CREATININE 7.22* 7.92* 8.27*  GLUCOSE 102* 112* 139*   Electrolytes  Recent Labs Lab 04/13/14 0105 04/13/14 1145 04/13/14 2330  CALCIUM 9.8 8.9 8.8   Sepsis Markers  Recent Labs Lab 04/13/14 1020 04/13/14 1700 04/13/14 2330  LATICACIDVEN 3.0* 2.3* 2.0   ABG  Recent Labs Lab 04/13/14 0711  PHART 7.456*  PCO2ART 28.2*  PO2ART 81.0   Liver Enzymes  Recent Labs Lab 04/12/14 1123 04/13/14 0105 04/13/14 0917  AST 68* 120*  --   ALT 47 69*  --   ALKPHOS 112 79  --   BILITOT 1.5* 1.7*  --   ALBUMIN 4.2 3.5 3.5   Cardiac Enzymes  Recent Labs Lab 04/12/14 1123 04/12/14 1843 04/13/14 0105 04/13/14 0640  TROPONINI  --  0.61* 0.63* 0.51*  PROBNP >70000.0*  --   --   --    Glucose  Recent Labs Lab 04/12/14 1801 04/12/14 2313 04/13/14 0637 04/13/14 1134 04/13/14 1717 04/14/14 0002  GLUCAP 85 97 117* 86 141* 122*    Imaging Dg Abd 1 View  04/13/2014   CLINICAL DATA:  Abdominal distention.  Pain and constipation.  EXAM: ABDOMEN - 1 VIEW  COMPARISON:  01/07/2014  FINDINGS: There are several dilated loops of small bowel identified. The largest is in the left lower quadrant measuring up to 4.3 cm. A paucity of colonic gas is noted. There is a small amount of gas noted in the rectum.  IMPRESSION: 1. Dilated small bowel loops which may reflect recurrent obstruction.   Electronically Signed   By: Signa Kellaylor  Stroud M.D.   On: 04/13/2014 11:38   Nm Pulmonary Perf And Vent  04/13/2014   CLINICAL DATA:  58 year old male with shortness of breath and chest pain.  EXAM: NUCLEAR MEDICINE VENTILATION - PERFUSION LUNG SCAN  TECHNIQUE: Ventilation images were obtained in multiple projections using inhaled aerosol technetium 99 M  DTPA. Perfusion images were obtained in multiple projections after intravenous injection of Tc-8167m MAA.  RADIOPHARMACEUTICALS:  40.0 mCi Tc-767m DTPA aerosol and 6.0 mCi Tc-9367m MAA  COMPARISON:  04/13/2014 chest radiograph  FINDINGS: Ventilation: Some clumping of the DTPA is noted. No focal ventilation defects are identified.  Perfusion: No wedge shaped peripheral perfusion defects to suggest acute pulmonary embolism.  IMPRESSION: Unremarkable pulmonary perfusion - No evidence of  pulmonary emboli.   Electronically Signed   By: Laveda Abbe M.D.   On: 04/13/2014 16:44   Dg Chest Port 1 View  04/13/2014   CLINICAL DATA:  Central venous line.  EXAM: PORTABLE CHEST - 1 VIEW  COMPARISON:  04/13/2014.  FINDINGS: New left IJ central line is present with the tip in the mid SVC. There is no pneumothorax. Faint airspace disease is present in the left lung which may represent asymmetric pulmonary edema or pneumonia. The appearance is more suggestive of edema. The cardiopericardial silhouette remains enlarged. On yesterday's exam, there appeared to be more prominent interstitial markings suggesting decreasing interstitial pulmonary edema on today's exam.  IMPRESSION: 1. Uncomplicated new left IJ central line with the tip in the mid SVC. 2. Cardiomegaly with mild airspace disease at the left base, likely representing pulmonary edema.   Electronically Signed   By: Andreas Newport M.D.   On: 04/13/2014 11:37   Dg Chest Port 1 View  04/13/2014   CLINICAL DATA:  fever, leukocytosis, resp failure  EXAM: PORTABLE CHEST - 1 VIEW  COMPARISON:  Portable chest 04/12/2014  FINDINGS: Stable cardiomegaly. No focal regions of consolidation or focal infiltrates. Area of discoid atelectasis along the periphery of the left lower lobe. No focal regions of consolidation or focal infiltrates. Minimal blunting of the left costophrenic angle. No acute osseous abnormalities.  IMPRESSION: Discoid atelectasis otherwise stable chest radiograph.    Electronically Signed   By: Salome Holmes M.D.   On: 04/13/2014 07:34    ASSESSMENT / PLAN:  PULMONARY A:  Acute on chronic respiratory failure COPD without evidence of acute exacerbation NOt impressed PNA P:   Supplemental O2 for SpO2 88-92% May require advanced airway for declining mental status.  PRN xopenex Check ABG Follow CXR   CARDIOVASCULAR A:  Shock hypovolemic, likely, septic cdiff A fib/flutter with RVR - failed dilt gtt Likely pericardial effusion PAF HTN EF 20%  P:  amiodarone drip, continued load Neo to keep MAP greater than 13mm/Hg, may need addition vaso Avoid levo if able TTE to assess for effusion - none noted Agree bolus, cvp 5 Cortisol 91, no role stress steroids Lactic has cleared no repeat needed  RENAL A:   ESRD on HD  P:   Renal following  No indications for emergent HD at this time.  bolus  GASTROINTESTINAL A:  C-dif colitis Hepatitis C - chronic  P:   C-dif management per ID section PPI for GI ppx Clears kub repeat for obstruction concerns  HEMATOLOGIC A:   Leukocytosis improved VTE ppx  P:  Heparin Follow CBC in am   INFECTIOUS A:   Septic shock C-dif colitis  P:   vanc to PO and higher dose flagyl Avoid empiric abx as able  ENDOCRINE A:   No rel AI  P:   No role steroids  NEUROLOGIC A:   Acute encephalopathy  P:   Monitor NO precedex with shock May need haldol, limit with EF 20% Ammonia low, but doesn't correlate with clinically enceph   I have personally obtained a history, examined the patient, evaluated laboratory and imaging results, formulated the assessment and plan and placed orders. CRITICAL CARE: The patient is critically ill with multiple organ systems failure and requires high complexity decision making for assessment and support, frequent evaluation and titration of therapies, application of advanced monitoring technologies and extensive interpretation of multiple databases. Critical  Care Time devoted to patient care services described in this note is 35 minutes.  Lavon Paganini. Titus Mould, MD, [place] [facility]: [facility]

## 2014-04-14 NOTE — Progress Notes (Signed)
INITIAL NUTRITION ASSESSMENT  DOCUMENTATION CODES Per approved criteria  -Not Applicable   INTERVENTION: Resource Breeze po TID, each supplement provides 250 kcal and 9 grams of protein  NUTRITION DIAGNOSIS: Inadequate oral intake related to chronic illness as evidenced by reported intake less than estimated needs and suspected weight loss.   Goal: Pt to meet >/= 90% of their estimated nutrition needs   Monitor:  Weight trends, I/O's, acceptance of supplements, po intake, diet advancement   Reason for Assessment: MST  58 y.o. male  Admitting Dx: Severe sepsis  ASSESSMENT:  58 y.o. male with a past history of hypertension, COPD, end-stage renal disease on dialysis on Tuesday, Thursday, Saturday, paroxysmal atrial fibrillation, noncompliance, who was in his usual state of health till sometime yesterday when he started feeling shortness of breath. He missed his dialysis on Tuesday.  - Pt says that he was eating well at home - Pt says that his appetite is starting to increase and he feels hungry - Pt suspects that he has lost weight  Height: Ht Readings from Last 1 Encounters:  04/12/14 5' 4.96" (1.65 m)    Weight: Wt Readings from Last 1 Encounters:  04/14/14 124 lb 1.9 oz (56.3 kg)    Ideal Body Weight: 61.4 kg  % Ideal Body Weight: 92%  Wt Readings from Last 10 Encounters:  04/14/14 124 lb 1.9 oz (56.3 kg)  01/31/14 113 lb 8.6 oz (51.5 kg)  01/10/14 110 lb 14.3 oz (50.3 kg)  01/06/14 121 lb 0.5 oz (54.9 kg)  01/02/14 118 lb (53.524 kg)  12/25/13 121 lb 14.6 oz (55.3 kg)  12/19/13 140 lb (63.504 kg)  12/12/13 139 lb 8.8 oz (63.3 kg)  12/04/13 138 lb (62.596 kg)  11/29/13 138 lb 14.2 oz (63 kg)    Usual Body Weight: unknown  % Usual Body Weight: unknown  BMI:  Body mass index is 20.68 kg/(m^2).  Estimated Nutritional Needs: Kcal: 1850-2050 Protein: 65-75 g Fluid: 1.8-2.0 L/day  Skin: WNL  Diet Order: Clear Liquid  EDUCATION NEEDS: -Education  needs addressed   Intake/Output Summary (Last 24 hours) at 04/14/14 1120 Last data filed at 04/14/14 1000  Gross per 24 hour  Intake 1570.87 ml  Output      0 ml  Net 1570.87 ml    Last BM: 6/2   Labs:   Recent Labs Lab 04/13/14 0105 04/13/14 1145 04/13/14 2330  NA 137 138 137  K 4.3 3.4* 3.6*  CL 91* 89* 92*  CO2 20 19 19   BUN 45* 60* 68*  CREATININE 7.22* 7.92* 8.27*  CALCIUM 9.8 8.9 8.8  GLUCOSE 102* 112* 139*    CBG (last 3)   Recent Labs  04/13/14 1134 04/13/14 1717 04/14/14 0002  GLUCAP 86 141* 122*    Scheduled Meds: . antiseptic oral rinse  15 mL Mouth Rinse BID  . aspirin  324 mg Oral Daily  . darbepoetin (ARANESP) injection - DIALYSIS  25 mcg Intravenous Q14 Days  . doxercalciferol  1 mcg Intravenous Q M,W,F-HD  . [START ON 04/19/2014] ferric gluconate (FERRLECIT/NULECIT) IV  62.5 mg Intravenous Q Wed-HD  . heparin  5,000 Units Subcutaneous 3 times per day  . vancomycin  500 mg Oral 4 times per day   And  . metronidazole  500 mg Intravenous Q8H  . sodium chloride  3 mL Intravenous Q12H  . sodium chloride  3 mL Intravenous Q12H    Continuous Infusions: . amiodarone 30 mg/hr (04/14/14 1100)  . diltiazem (CARDIZEM) infusion  Stopped (04/13/14 2137)  . phenylephrine (NEO-SYNEPHRINE) Adult infusion 85 mcg/min (04/14/14 0900)    Past Medical History  Diagnosis Date  . COPD (chronic obstructive pulmonary disease)   . Hypertension   . Hep C w/ coma, chronic   . Irregular heartbeat   . ESRD (end stage renal disease) on dialysis     /notes 11/11/2013  . Smoker unmotivated to quit   . Active smoker   . PTSD (post-traumatic stress disorder)   . Shortness of breath     Past Surgical History  Procedure Laterality Date  . Paracentesis  ~ 10/2013    [name] 11/11/2013    Ebbie Latus RD, LDN

## 2014-04-14 NOTE — Clinical Documentation Improvement (Signed)
MD's, NP's, and PA's    Noted documentation of abbreviation "HPTH" in notes please spell out diagnosis at least once in medical record for coding ,  purposes, also define origin .  Thank you   Possible Clinical Conditions?  Primary hyperparathyroidism  Secondary hyperparathyroidism (renal cause)   Other Condition  Cannot Clinically Determine    Thank You, Lavonda Jumbo ,RN Clinical Documentation Specialist:  [phone]  [facility]- Health Information Management

## 2014-04-14 NOTE — Progress Notes (Signed)
  [facility] Progress Note   Subjective: still on pressors, diarrhea better, more alert but still sluggish, CVP 5 last night, no boluses overnight  Filed Vitals:   04/14/14 0435 04/14/14 0440 04/14/14 0500 04/14/14 0600  BP:   100/68   Pulse:      Temp:  97.4 F (36.3 C)    TempSrc:  Oral    Resp: 20 14 22 31   Height:      Weight:      SpO2:       Exam: Awakens easily, mostly oriented but sluggish No jvd Chest occ coarse upper airway sounds, o/w clear Irreg, tachy, no rub or gallop Abd nondistended, +BS  No LE or UE edema, skin turgor poor  Neuro sluggish, moves extremities to stimulus RUA AVG is patent   HD: MWF [facility]  3.5h   F160   55kg   2/2.0 Bath   Heparin 3000  RUA AVG  Hect 1 ug tiw   Aranesp 25 ug q other wk (last was supposed to be 6/2 but pt no-showed) Fe 50/wk   Assessment:  1 Shock d/t sepsis / hypovolemia- still on pressors but better overall 2 Fever / ^WBC- much better, prob due to Cdif; on IV flagyl and po vanc 3 Cdif assoc diarrhea- better 4 ESRD on HD 5 Hep C / cirrhosis 6 HTN/volume- in shock, below dry wt, looks dry on exam still and high Hb/alb suggest hemoconcentration 7 Anemia no esa, Hb up 8 HPTH  Ca ok, check phos 9 COPD stable  Plan- give more fluid boluses, wean off pressors, HD today with no fluid off    [name] MD  pager [phone]    cell [phone]  04/14/2014, 9:12 AM     Recent Labs Lab 04/13/14 0105 04/13/14 1145 04/13/14 2330  NA 137 138 137  K 4.3 3.4* 3.6*  CL 91* 89* 92*  CO2 20 19 19   GLUCOSE 102* 112* 139*  BUN 45* 60* 68*  CREATININE 7.22* 7.92* 8.27*  CALCIUM 9.8 8.9 8.8    Recent Labs Lab 04/12/14 1123 04/13/14 0105 04/13/14 0917  AST 68* 120*  --   ALT 47 69*  --   ALKPHOS 112 79  --   BILITOT 1.5* 1.7*  --   PROT 9.5* 8.9*  --   ALBUMIN 4.2 3.5 3.5    Recent Labs Lab 04/12/14 0923 04/13/14 0105  WBC 19.2* 15.4*  HGB 12.7* 13.3  HCT 37.0* 39.4  MCV 77.2* 77.1*  PLT  106* 135*   . antiseptic oral rinse  15 mL Mouth Rinse BID  . aspirin  324 mg Oral Daily  . darbepoetin (ARANESP) injection - DIALYSIS  25 mcg Intravenous Q14 Days  . doxercalciferol  1 mcg Intravenous Q M,W,F-HD  . [START ON 04/19/2014] ferric gluconate (FERRLECIT/NULECIT) IV  62.5 mg Intravenous Q Wed-HD  . heparin  5,000 Units Subcutaneous 3 times per day  . vancomycin  500 mg Oral 4 times per day   And  . metronidazole  500 mg Intravenous Q8H  . sodium chloride  3 mL Intravenous Q12H  . sodium chloride  3 mL Intravenous Q12H   . amiodarone 30 mg/hr (04/14/14 0008)  . diltiazem (CARDIZEM) infusion Stopped (04/13/14 2137)  . phenylephrine (NEO-SYNEPHRINE) Adult infusion 75 mcg/min (04/14/14 0625)   sodium chloride, acetaminophen, acetaminophen, levalbuterol, morphine injection, ondansetron (ZOFRAN) IV, ondansetron, oxyCODONE, sodium chloride

## 2014-04-14 NOTE — Progress Notes (Signed)
Pt with recent fidgeting and restlessness; pt pulling at IV tubing and EKG electrodes; mittens in place but easily removed repeatedly by pt; MD called and orders received; haldol IV given per MD orders; pt resting quietly c eyes closed currently; will continue to monitor

## 2014-04-15 LAB — BASIC METABOLIC PANEL
BUN: 27 mg/dL — ABNORMAL HIGH (ref 6–23)
CO2: 25 mEq/L (ref 19–32)
Calcium: 8.7 mg/dL (ref 8.4–10.5)
Chloride: 96 mEq/L (ref 96–112)
Creatinine, Ser: 3.63 mg/dL — ABNORMAL HIGH (ref 0.50–1.35)
GFR, EST AFRICAN AMERICAN: 19 mL/min — AB (ref >90)
GFR, EST NON AFRICAN AMERICAN: 17 mL/min — AB (ref >90)
Glucose, Bld: 161 mg/dL — ABNORMAL HIGH (ref 70–99)
Potassium: 3.5 mEq/L — ABNORMAL LOW (ref 3.7–5.3)
SODIUM: 137 meq/L (ref 137–147)

## 2014-04-15 LAB — GLUCOSE, CAPILLARY
Glucose-Capillary: 189 mg/dL — ABNORMAL HIGH (ref 70–99)
Glucose-Capillary: 130 mg/dL — ABNORMAL HIGH (ref 70–99)

## 2014-04-15 LAB — PHOSPHORUS: Phosphorus: 4.3 mg/dL (ref 2.3–4.6)

## 2014-04-15 MED ORDER — AMIODARONE HCL IN DEXTROSE 360-4.14 MG/200ML-% IV SOLN
INTRAVENOUS | Status: AC
  Filled 2014-04-15: qty 200

## 2014-04-15 MED ORDER — INSULIN ASPART 100 UNIT/ML ~~LOC~~ SOLN
0.0000 [IU] | Freq: Three times a day (TID) | SUBCUTANEOUS | Status: DC
  Administered 2014-04-15: 3 [IU] via SUBCUTANEOUS
  Administered 2014-04-16 (×2): 2 [IU] via SUBCUTANEOUS
  Administered 2014-04-16: 3 [IU] via SUBCUTANEOUS
  Administered 2014-04-17: 2 [IU] via SUBCUTANEOUS

## 2014-04-15 MED ORDER — INSULIN ASPART 100 UNIT/ML ~~LOC~~ SOLN: 0.0000 [IU] | Freq: Every day | SUBCUTANEOUS | Status: DC

## 2014-04-15 MED ORDER — SODIUM CHLORIDE 0.9 % IV SOLN: INTRAVENOUS | Status: DC

## 2014-04-15 MED ORDER — MIDODRINE HCL 5 MG PO TABS
10.0000 mg | ORAL_TABLET | Freq: Three times a day (TID) | ORAL | Status: DC
  Administered 2014-04-15 – 2014-04-17 (×5): 10 mg via ORAL
  Filled 2014-04-15 (×11): qty 2

## 2014-04-15 MED ORDER — CARVEDILOL 3.125 MG PO TABS
3.1250 mg | ORAL_TABLET | Freq: Two times a day (BID) | ORAL | Status: DC
  Administered 2014-04-15 – 2014-04-17 (×4): 3.125 mg via ORAL
  Filled 2014-04-15 (×7): qty 1

## 2014-04-15 NOTE — Progress Notes (Signed)
While doing order chart check noticed that Amio was d/c'd AM of 6/6. Stopped drip immediately. Pt vitals stable. Will continue to monitor.

## 2014-04-15 NOTE — Progress Notes (Signed)
  [facility] Progress Note   Subjective: per RN off pressors now, BP up and MS is much better, agitation improved. R'xd overnight for afib   Filed Vitals:   04/15/14 0505 04/15/14 0542 04/15/14 0600 04/15/14 0620  BP: 114/68 96/64  105/72  Pulse:      Temp:      TempSrc:      Resp: 18 17 40 23  Height:      Weight:   57 kg (125 lb 10.6 oz)   SpO2:       Exam: More alert today and appropriate No jvd Chest dec'd air movement throughout, no wheezing, no rales RRR no MRG Abd nondistended, +BS  No LE or UE edema Neuro sluggish, moves extremities to stimulus RUA AVG is patent   HD: MWF [facility]  3.5h   F160   55kg   2/2.0 Bath   Heparin 3000  RUA AVG  Hect 1 ug tiw   Aranesp 25 ug q other wk (last was supposed to be 6/2 but pt no-showed) Fe 50/wk   Assessment:  1 Shock due to sepsis / hypovolemia- better, off pressors 2 Fever / Cdif- improved, on IV flagyl and po vanc 4 ESRD on HD 5 Hep C / cirrhosis 6 HTN/volume- up 2kg by wt now, euvolemic on exam 7 Anemia no esa, Hb up 8 HPTH  Ca ok, check phos 9 COPD stable  Plan- no new suggestions, next HD Monday    [name] MD  pager [phone]    cell [phone]  04/15/2014, 10:36 AM     Recent Labs Lab 04/13/14 2330 04/14/14 1400 04/14/14 2345  NA 137 132* 137  K 3.6* 3.0* 3.5*  CL 92* 87* 96  CO2 19 19 25   GLUCOSE 139* 185* 161*  BUN 68* 81* 27*  CREATININE 8.27* 8.30* 3.63*  CALCIUM 8.8 8.8 8.7    Recent Labs Lab 04/12/14 1123 04/13/14 0105 04/13/14 0917  AST 68* 120*  --   ALT 47 69*  --   ALKPHOS 112 79  --   BILITOT 1.5* 1.7*  --   PROT 9.5* 8.9*  --   ALBUMIN 4.2 3.5 3.5    Recent Labs Lab 04/12/14 0923 04/13/14 0105  WBC 19.2* 15.4*  HGB 12.7* 13.3  HCT 37.0* 39.4  MCV 77.2* 77.1*  PLT 106* 135*   . antiseptic oral rinse  15 mL Mouth Rinse BID  . aspirin  324 mg Oral Daily  . carvedilol  3.125 mg Oral BID WC  . darbepoetin (ARANESP) injection - DIALYSIS  25 mcg  Intravenous Q14 Days  . doxercalciferol  1 mcg Intravenous Q M,W,F-HD  . feeding supplement (RESOURCE BREEZE)  1 Container Oral TID BM  . [START ON 04/19/2014] ferric gluconate (FERRLECIT/NULECIT) IV  62.5 mg Intravenous Q Wed-HD  . heparin  3,000 Units Dialysis Once in dialysis  . heparin  5,000 Units Subcutaneous 3 times per day  . vancomycin  500 mg Oral 4 times per day   And  . metronidazole  500 mg Intravenous Q8H  . sodium chloride  3 mL Intravenous Q12H     sodium chloride, sodium chloride, sodium chloride, feeding supplement (NEPRO CARB STEADY), heparin, levalbuterol, lidocaine (PF), lidocaine-prilocaine, ondansetron (ZOFRAN) IV, ondansetron, oxyCODONE, pentafluoroprop-tetrafluoroeth, pentafluoroprop-tetrafluoroeth

## 2014-04-15 NOTE — Progress Notes (Signed)
PULMONARY / CRITICAL CARE MEDICINE   Name: Carl Benson MRN: [id number] DOB: Mar 18, 1956    ADMISSION DATE:  04/12/2014 CONSULTATION DATE:  04/13/2014  REFERRING MD :  [facility] PRIMARY SERVICE: PCCM  CHIEF COMPLAINT:  SOB  BRIEF PATIENT DESCRIPTION: 58 year old male with ESRD on HD, COPD, and PAF. Presented to ED 6/3 with dyspnea after missing HD. Transferred to ICU with fever, hypotension.  SIGNIFICANT EVENTS / STUDIES:  6/3  Admitted with dyspnea after missing HD 6/4  Fever, hypotension, transferred to ICU 6/4  C. Diff POS 6/4  TTE >>> EF 20%, moderatelly dilated RV, moderate TR, mild pulmonary hypertension.  LINES / TUBES: LIJ CVL 6/4 >>> L R AL 6/3 >>> 6/6  CULTURES: 6/3 Blood  >>> 6/3 C.Diff PCR >>> POSITIVE  ANTIBIOTICS: Vancomycin oral 6/3 >>> Flagyl 6/4 >>> Cefepime 6/3 >>> 6/4  INTERVAL HISTORY:  Labile BP, on ambient air  VITAL SIGNS: Temp:  [97.5 F (36.4 C)-99.3 F (37.4 C)] 99.1 F (37.3 C) (06/06 0400) Pulse Rate:  [57-98] 89 (06/05 2300) Resp:  [12-40] 23 (06/06 0620) BP: (76-127)/(41-75) 105/72 mmHg (06/06 0620) SpO2:  [68 %-100 %] 100 % (06/06 0400) Arterial Line BP: (75-172)/(41-156) 172/156 mmHg (06/06 0620) Weight:  [55.6 kg (122 lb 9.2 oz)-57 kg (125 lb 10.6 oz)] 57 kg (125 lb 10.6 oz) (06/06 0600)  HEMODYNAMICS: CVP:  [7 mmHg-15 mmHg] 15 mmHg  VENTILATOR SETTINGS:   INTAKE / OUTPUT: Intake/Output     06/05 0701 - 06/06 0700 06/06 0701 - 06/07 0700   P.O. 650    I.V. (mL/kg) 1336.8 (23.5)    IV Piggyback 300    Total Intake(mL/kg) 2286.8 (40.1)    Other -400    Total Output -400     Net +2686.8          Stool Occurrence 4 x      PHYSICAL EXAMINATION: General:  No distress Neuro:  Awake, alert HEENT:  No JVD Cardiovascular:  Regular, no murmurs Lungs: Bilateral air entry Abdomen:  Soft, non-disteneded Musculoskeletal:  Moves all extremities, no edema Skin:  Intact  LABS:  CBC  Recent Labs Lab 04/12/14 0923 04/13/14 0105   WBC 19.2* 15.4*  HGB 12.7* 13.3  HCT 37.0* 39.4  PLT 106* 135*   Coag's No results found for this basename: APTT, INR,  in the last 168 hours  BMET  Recent Labs Lab 04/13/14 2330 04/14/14 1400 04/14/14 2345  NA 137 132* 137  K 3.6* 3.0* 3.5*  CL 92* 87* 96  CO2 19 19 25   BUN 68* 81* 27*  CREATININE 8.27* 8.30* 3.63*  GLUCOSE 139* 185* 161*   Electrolytes  Recent Labs Lab 04/13/14 2330 04/14/14 1400 04/14/14 2345  CALCIUM 8.8 8.8 8.7   Sepsis Markers  Recent Labs Lab 04/13/14 1020 04/13/14 1700 04/13/14 2330  LATICACIDVEN 3.0* 2.3* 2.0   ABG  Recent Labs Lab 04/13/14 0711  PHART 7.456*  PCO2ART 28.2*  PO2ART 81.0   Liver Enzymes  Recent Labs Lab 04/12/14 1123 04/13/14 0105 04/13/14 0917  AST 68* 120*  --   ALT 47 69*  --   ALKPHOS 112 79  --   BILITOT 1.5* 1.7*  --   ALBUMIN 4.2 3.5 3.5   Cardiac Enzymes  Recent Labs Lab 04/12/14 1123 04/12/14 1843 04/13/14 0105 04/13/14 0640  TROPONINI  --  0.61* 0.63* 0.51*  PROBNP >70000.0*  --   --   --    Glucose  Recent Labs Lab 04/12/14 2313  04/13/14 0637 04/13/14 1134 04/13/14 1717 04/14/14 0002 04/14/14 1745  GLUCAP 97 117* 86 141* 122* 116*   IMAGING:  Dg Abd 1 View  04/13/2014   CLINICAL DATA:  Abdominal distention.  Pain and constipation.  EXAM: ABDOMEN - 1 VIEW  COMPARISON:  01/07/2014  FINDINGS: There are several dilated loops of small bowel identified. The largest is in the left lower quadrant measuring up to 4.3 cm. A paucity of colonic gas is noted. There is a small amount of gas noted in the rectum.  IMPRESSION: 1. Dilated small bowel loops which may reflect recurrent obstruction.   Electronically Signed   By: Signa Kell M.D.   On: 04/13/2014 11:38   Nm Pulmonary Perf And Vent  04/13/2014   CLINICAL DATA:  58 year old male with shortness of breath and chest pain.  EXAM: NUCLEAR MEDICINE VENTILATION - PERFUSION LUNG SCAN  TECHNIQUE: Ventilation images were obtained in  multiple projections using inhaled aerosol technetium 99 M DTPA. Perfusion images were obtained in multiple projections after intravenous injection of Tc-62m MAA.  RADIOPHARMACEUTICALS:  40.0 mCi Tc-78m DTPA aerosol and 6.0 mCi Tc-35m MAA  COMPARISON:  04/13/2014 chest radiograph  FINDINGS: Ventilation: Some clumping of the DTPA is noted. No focal ventilation defects are identified.  Perfusion: No wedge shaped peripheral perfusion defects to suggest acute pulmonary embolism.  IMPRESSION: Unremarkable pulmonary perfusion - No evidence of pulmonary emboli.   Electronically Signed   By: Laveda Abbe M.D.   On: 04/13/2014 16:44   Dg Chest Port 1 View  04/13/2014   CLINICAL DATA:  Central venous line.  EXAM: PORTABLE CHEST - 1 VIEW  COMPARISON:  04/13/2014.  FINDINGS: New left IJ central line is present with the tip in the mid SVC. There is no pneumothorax. Faint airspace disease is present in the left lung which may represent asymmetric pulmonary edema or pneumonia. The appearance is more suggestive of edema. The cardiopericardial silhouette remains enlarged. On yesterday's exam, there appeared to be more prominent interstitial markings suggesting decreasing interstitial pulmonary edema on today's exam.  IMPRESSION: 1. Uncomplicated new left IJ central line with the tip in the mid SVC. 2. Cardiomegaly with mild airspace disease at the left base, likely representing pulmonary edema.   Electronically Signed   By: Andreas Newport M.D.   On: 04/13/2014 11:37   Dg Abd Portable 1v  04/14/2014   CLINICAL DATA:  Small bowel obstruction.  EXAM: PORTABLE ABDOMEN - 1 VIEW  COMPARISON:  04/13/2014  FINDINGS: Mild gaseous prominence of small bowel remains. Some air is now also present throughout most of the colon. Findings are more consistent with ileus rather than overt small bowel obstruction.  IMPRESSION: Mild gaseous prominence of small bowel and colon. Findings are suggestive of mild diffuse ileus.   Electronically Signed   By:  Irish Lack M.D.   On: 04/14/2014 11:51    ASSESSMENT / PLAN:  PULMONARY A:  Acute respiratory failure in setting of pulmonary edema COPD without evidence exacerbation Doubt pneumonia P:   Goal SpO2>92 Supplemental oxygen PRN Xopenex PRN  CARDIOVASCULAR A:  Septic shock in setting of pseudomembranous colitis / hypovolemia, resolved PAF, now in SR Likely pericardial effusion PAF Acute on chronic systolic heart failure Moderate TR Erroneous noninvasive BP measurement ( 30 torr lower than a-line ) Baseline hypotension? P:  Goal SBP>100 D/c Amiodarone D/c Neo ASA Start Coreg Start Midodrine May need ACEI / ARB  RENAL A:   ESRD on HD P:   Trend BMP Renal  following  HD  GASTROINTESTINAL A:  C. Diff colitis Chronic hepatitis C  GI Px is not indicated P:   Advance diet as tolerated D/c Tylenol  HEMATOLOGIC A:   Anemia of renal disease VTE Px P:  Heparin Trend CBC Iron / Aranesp  INFECTIOUS A:   Septic shock Pseudomembranous colitis P:   Abx / cx as above  ENDOCRINE  A:   Normal adrenal function Hyperglycemia on BMP P:   Start SSI  NEUROLOGIC A:   Acute encephalopathy, resolving Pain P:   D/c Morphine Oxycodone PRN  I have personally obtained history, examined patient, evaluated and interpreted laboratory and imaging results, reviewed medical records, formulated assessment / plan and placed orders.  CRITICAL CARE:  The patient is critically ill with multiple organ systems failure and requires high complexity decision making for assessment and support, frequent evaluation and titration of therapies, application of advanced monitoring technologies and extensive interpretation of multiple databases. Critical Care Time devoted to patient care services described in this note is 35 minutes.   Lonia FarberKonstantin Amadeus Oyama, MD Pulmonary and Critical Care Medicine [facility] Pager: [phone]  04/15/2014, 9:09 AM

## 2014-04-16 LAB — BASIC METABOLIC PANEL
BUN: 46 mg/dL — ABNORMAL HIGH (ref 6–23)
CHLORIDE: 96 meq/L (ref 96–112)
CO2: 22 mEq/L (ref 19–32)
Calcium: 8.7 mg/dL (ref 8.4–10.5)
Creatinine, Ser: 5.26 mg/dL — ABNORMAL HIGH (ref 0.50–1.35)
GFR calc non Af Amer: 11 mL/min — ABNORMAL LOW (ref >90)
GFR, EST AFRICAN AMERICAN: 12 mL/min — AB (ref >90)
Glucose, Bld: 115 mg/dL — ABNORMAL HIGH (ref 70–99)
POTASSIUM: 3 meq/L — AB (ref 3.7–5.3)
Sodium: 137 mEq/L (ref 137–147)

## 2014-04-16 LAB — GLUCOSE, CAPILLARY
GLUCOSE-CAPILLARY: 122 mg/dL — AB (ref 70–99)
GLUCOSE-CAPILLARY: 145 mg/dL — AB (ref 70–99)
Glucose-Capillary: 170 mg/dL — ABNORMAL HIGH (ref 70–99)
Glucose-Capillary: 130 mg/dL — ABNORMAL HIGH (ref 70–99)

## 2014-04-16 LAB — CBC
HEMATOCRIT: 27 % — AB (ref 39.0–52.0)
Hemoglobin: 10 g/dL — ABNORMAL LOW (ref 13.0–17.0)
MCH: 25.7 pg — ABNORMAL LOW (ref 26.0–34.0)
MCHC: 37 g/dL — ABNORMAL HIGH (ref 30.0–36.0)
MCV: 69.4 fL — ABNORMAL LOW (ref 78.0–100.0)
PLATELETS: 90 10*3/uL — AB (ref 150–400)
RBC: 3.89 MIL/uL — ABNORMAL LOW (ref 4.22–5.81)
RDW: 15.1 % (ref 11.5–15.5)
WBC: 7 10*3/uL (ref 4.0–10.5)

## 2014-04-16 MED ORDER — POTASSIUM CHLORIDE CRYS ER 20 MEQ PO TBCR
20.0000 meq | EXTENDED_RELEASE_TABLET | Freq: Once | ORAL | Status: AC
  Administered 2014-04-16: 20 meq via ORAL
  Filled 2014-04-16: qty 1

## 2014-04-16 NOTE — Progress Notes (Signed)
[facility] Progress Note   Subjective: feeling better overall, up in the chair, alert   Filed Vitals:   04/16/14 0601 04/16/14 0800 04/16/14 0820 04/16/14 1110  BP:   131/63 107/66  Pulse: 56 81 53 70  Temp:   97.9 F (36.6 C) 98.4 F (36.9 C)  TempSrc:   Oral Oral  Resp: 30 25 26 22   Height:      Weight:      SpO2: 98% 100% 100% 98%   Exam: No distress, calm, talkative No jvd Chest dec'd air movement throughout, no wheezing, no rales RRR no MRG Abd nondistended, +BS  No LE or UE edema Alert and Ox3, nf RUA AVG is patent   HD: MWF [place]  3.5h   F160   55kg   2/2.0 Bath   Heparin 3000  RUA AVG  Hect 1 ug tiw   Aranesp 25 ug q other wk (last was supposed to be 6/2 but pt no-showed) Fe 50/wk   Assessment:  1 Shock due to sepsis / hypovolemia- resolved 2 Fever / Cdif- improved, on IV flagyl and po vanc 4 ESRD on HD 5 Hep C / cirrhosis 6 HTN/volume- low BP's a chronic issue, on midodrine, up 2-3kg by wts 7 Anemia low-dose esa, Hb 10 8 HPTH  Ca/ P in range > cont vit D, no binder 9 COPD stable 10 Afib- now in NSR, on coreg  Plan- HD Monday, UF to dry wt    [name] MD  pager [phone]    cell [phone]  04/16/2014, 12:58 PM     Recent Labs Lab 04/14/14 1400 04/14/14 2345 04/15/14 1800 04/16/14 0430  NA 132* 137  --  137  K 3.0* 3.5*  --  3.0*  CL 87* 96  --  96  CO2 19 25  --  22  GLUCOSE 185* 161*  --  115*  BUN 81* 27*  --  46*  CREATININE 8.30* 3.63*  --  5.26*  CALCIUM 8.8 8.7  --  8.7  PHOS  --   --  4.3  --     Recent Labs Lab 04/12/14 1123 04/13/14 0105 04/13/14 0917  AST 68* 120*  --   ALT 47 69*  --   ALKPHOS 112 79  --   BILITOT 1.5* 1.7*  --   PROT 9.5* 8.9*  --   ALBUMIN 4.2 3.5 3.5    Recent Labs Lab 04/12/14 0923 04/13/14 0105 04/16/14 0430  WBC 19.2* 15.4* 7.0  HGB 12.7* 13.3 10.0*  HCT 37.0* 39.4 27.0*  MCV 77.2* 77.1* 69.4*  PLT 106* 135* 90*   . antiseptic oral rinse  15 mL Mouth Rinse  BID  . aspirin  324 mg Oral Daily  . carvedilol  3.125 mg Oral BID WC  . darbepoetin (ARANESP) injection - DIALYSIS  25 mcg Intravenous Q14 Days  . doxercalciferol  1 mcg Intravenous Q M,W,F-HD  . feeding supplement (RESOURCE BREEZE)  1 Container Oral TID BM  . [START ON 04/19/2014] ferric gluconate (FERRLECIT/NULECIT) IV  62.5 mg Intravenous Q Wed-HD  . heparin  5,000 Units Subcutaneous 3 times per day  . insulin aspart  0-15 Units Subcutaneous TID WC  . insulin aspart  0-5 Units Subcutaneous QHS  . vancomycin  500 mg Oral 4 times per day   And  . metronidazole  500 mg Intravenous Q8H  . midodrine  10 mg Oral TID WC  . sodium chloride  3 mL Intravenous Q12H   .  sodium chloride     sodium chloride, levalbuterol, ondansetron (ZOFRAN) IV, ondansetron, oxyCODONE

## 2014-04-16 NOTE — Progress Notes (Signed)
PULMONARY / CRITICAL CARE MEDICINE  Name: Carl Benson MRN: [id number] DOB: [birth year]    ADMISSION DATE:  04/12/2014 CONSULTATION DATE:  04/13/2014  REFERRING MD :  [facility] PRIMARY SERVICE: PCCM  CHIEF COMPLAINT:  SOB  BRIEF PATIENT DESCRIPTION: 58 year old male with ESRD on HD, COPD, and PAF. Presented to ED 6/3 with dyspnea after missing HD. Transferred to ICU with fever, hypotension.  SIGNIFICANT EVENTS / STUDIES:  6/3  Admitted with dyspnea after missing HD 6/4  Fever, hypotension, transferred to ICU 6/4  C. Diff POS 6/4  TTE >>> EF 20%, moderatelly dilated RV, moderate TR, mild pulmonary hypertension.  LINES / TUBES: LIJ CVL 6/4 >>> 6/7 L R AL 6/3 >>> 6/6  CULTURES: 6/3 Blood  >>> 6/3 C.Diff PCR >>> POSITIVE  ANTIBIOTICS: Vancomycin oral 6/3 >>> Flagyl 6/4 >>> Cefepime 6/3 >>> 6/4  INTERVAL HISTORY:  Improved BP, controlled rate AF  VITAL SIGNS: Temp:  [97.4 F (36.3 C)-98.5 F (36.9 C)] 97.8 F (36.6 C) (06/07 0400) Pulse Rate:  [56-81] 56 (06/07 0601) Resp:  [15-39] 30 (06/07 0601) BP: (71-134)/(43-87) 118/73 mmHg (06/07 0300) SpO2:  [94 %-100 %] 98 % (06/07 0601) Arterial Line BP: (83-175)/(54-87) 133/86 mmHg (06/07 0601) Weight:  [58.7 kg (129 lb 6.6 oz)] 58.7 kg (129 lb 6.6 oz) (06/07 0426)  HEMODYNAMICS:   VENTILATOR SETTINGS:   INTAKE / OUTPUT: Intake/Output     06/06 0701 - 06/07 0700 06/07 0701 - 06/08 0700   P.O. 1120    I.V. (mL/kg) 647.2 (11)    IV Piggyback 300    Total Intake(mL/kg) 2067.2 (35.2)    Other     Total Output       Net +2067.2          Stool Occurrence 1 x     PHYSICAL EXAMINATION: General:  Comfortable, resting in chair Neuro:  Awake, alert, cooperative HEENT:  No JVD Cardiovascular:  Irregular Lungs: Bilateral air entry, no added sounds  Abdomen:  Soft, non-disteneded Musculoskeletal:  Moves all extremities, no edema Skin:  Intact  LABS:  CBC  Recent Labs Lab 04/12/14 0923 04/13/14 0105 04/16/14 0430  WBC  19.2* 15.4* 7.0  HGB 12.7* 13.3 10.0*  HCT 37.0* 39.4 27.0*  PLT 106* 135* 90*   Coag's No results found for this basename: APTT, INR,  in the last 168 hours  BMET  Recent Labs Lab 04/14/14 1400 04/14/14 2345 04/16/14 0430  NA 132* 137 137  K 3.0* 3.5* 3.0*  CL 87* 96 96  CO2 19 25 22   BUN 81* 27* 46*  CREATININE 8.30* 3.63* 5.26*  GLUCOSE 185* 161* 115*   Electrolytes  Recent Labs Lab 04/14/14 1400 04/14/14 2345 04/15/14 1800 04/16/14 0430  CALCIUM 8.8 8.7  --  8.7  PHOS  --   --  4.3  --    Sepsis Markers  Recent Labs Lab 04/13/14 1020 04/13/14 1700 04/13/14 2330  LATICACIDVEN 3.0* 2.3* 2.0   ABG  Recent Labs Lab 04/13/14 0711  PHART 7.456*  PCO2ART 28.2*  PO2ART 81.0   Liver Enzymes  Recent Labs Lab 04/12/14 1123 04/13/14 0105 04/13/14 0917  AST 68* 120*  --   ALT 47 69*  --   ALKPHOS 112 79  --   BILITOT 1.5* 1.7*  --   ALBUMIN 4.2 3.5 3.5   Cardiac Enzymes  Recent Labs Lab 04/12/14 1123 04/12/14 1843 04/13/14 0105 04/13/14 0640  TROPONINI  --  0.61* 0.63* 0.51*  PROBNP >70000.0*  --   --   --  Glucose  Recent Labs Lab 04/13/14 1134 04/13/14 1717 04/14/14 0002 04/14/14 1745 04/15/14 1639 04/15/14 2132  GLUCAP 86 141* 122* 116* 189* 130*   IMAGING:  Dg Abd Portable 1v  04/14/2014   CLINICAL DATA:  Small bowel obstruction.  EXAM: PORTABLE ABDOMEN - 1 VIEW  COMPARISON:  04/13/2014  FINDINGS: Mild gaseous prominence of small bowel remains. Some air is now also present throughout most of the colon. Findings are more consistent with ileus rather than overt small bowel obstruction.  IMPRESSION: Mild gaseous prominence of small bowel and colon. Findings are suggestive of mild diffuse ileus.   Electronically Signed   By: Irish LackGlenn  Yamagata M.D.   On: 04/14/2014 11:51   ASSESSMENT / PLAN:  PULMONARY A:  Acute respiratory failure in setting of pulmonary edema, resolved COPD without evidence exacerbation Doubt pneumonia P:    Goal SpO2>92 Supplemental oxygen PRN Xopenex PRN  CARDIOVASCULAR A:  Septic shock in setting of pseudomembranous colitis / hypovolemia, resolved PAF, now in SR Acute on chronic systolic heart failure Moderate TR Baseline hypotension? P:  Goal SBP>100 ASA Coreg Midodrine May need ACEI / ARB Unlikely a candidate for chronic anticoagulation given history of medical noncompliance May benefit form Cardiology follow up D/c CVL  RENAL A:   ESRD on HD Hypokalemia P:   Trend BMP Renal following  HD K 20  GASTROINTESTINAL A:  C. Diff colitis Chronic hepatitis C  GI Px is not indicated P:   Diet  HEMATOLOGIC A:   Anemia of renal disease Thrombocytopenia VTE Px P:  Heparin Trend CBC Iron / Aranesp  INFECTIOUS A:   Pseudomembranous colitis P:   Abx / cx as above  ENDOCRINE  A:   Normal adrenal function Hyperglycemia on BMP P:   SSI  NEUROLOGIC A:   Acute encephalopathy, resolved Pain P:   Oxycodone PRN  Transfer to telemetry.  PCCM will sign off.  TRH to assume care 6/8.  I have personally obtained history, examined patient, evaluated and interpreted laboratory and imaging results, reviewed medical records, formulated assessment / plan and placed orders.  Lonia FarberKonstantin Katiria Calame, MD Pulmonary and Critical Care Medicine [facility] Pager: [phone]  04/16/2014, 8:55 AM

## 2014-04-16 NOTE — Progress Notes (Signed)
[facility] ADULT ICU REPLACEMENT PROTOCOL FOR AM LAB REPLACEMENT ONLY  The patient does not apply for the [facility] Adult ICU Electrolyte Replacment Protocol based on the criteria listed below:   Is GFR >/= 40 ml/min? no  Patient's GFR today is 11   Abnormal electrolyte(s)3.0   If a panic level lab has been reported, has the CCM MD in charge been notified? yes.   Physician:  Holland Commons, MD  Melrose Nakayama 04/16/2014 6:42 AM

## 2014-04-16 NOTE — Progress Notes (Signed)
Pt transferred from 2H10 to 6E03 via wheelchair. Telemetry placed, and central telemetry notified.Enteric precautions in place for c.diff. Assessment completed. Pt oriented to unit, room, and staff. Continue to monitor. Hortencia Conradi, RN

## 2014-04-17 DIAGNOSIS — Z91199 Patient's noncompliance with other medical treatment and regimen due to unspecified reason: Secondary | ICD-10-CM

## 2014-04-17 DIAGNOSIS — I428 Other cardiomyopathies: Secondary | ICD-10-CM

## 2014-04-17 DIAGNOSIS — Z9119 Patient's noncompliance with other medical treatment and regimen: Secondary | ICD-10-CM

## 2014-04-17 DIAGNOSIS — I420 Dilated cardiomyopathy: Secondary | ICD-10-CM | POA: Diagnosis present

## 2014-04-17 DIAGNOSIS — A0472 Enterocolitis due to Clostridium difficile, not specified as recurrent: Secondary | ICD-10-CM | POA: Diagnosis present

## 2014-04-17 LAB — CBC
HCT: 29.2 % — ABNORMAL LOW (ref 39.0–52.0)
Hemoglobin: 10.9 g/dL — ABNORMAL LOW (ref 13.0–17.0)
MCH: 25.8 pg — AB (ref 26.0–34.0)
MCHC: 37.3 g/dL — AB (ref 30.0–36.0)
MCV: 69.2 fL — ABNORMAL LOW (ref 78.0–100.0)
PLATELETS: 106 10*3/uL — AB (ref 150–400)
RBC: 4.22 MIL/uL (ref 4.22–5.81)
RDW: 15.9 % — AB (ref 11.5–15.5)
WBC: 8.7 10*3/uL (ref 4.0–10.5)

## 2014-04-17 LAB — GLUCOSE, CAPILLARY
GLUCOSE-CAPILLARY: 143 mg/dL — AB (ref 70–99)
Glucose-Capillary: 119 mg/dL — ABNORMAL HIGH (ref 70–99)

## 2014-04-17 MED ORDER — CARVEDILOL 3.125 MG PO TABS: 3.1250 mg | ORAL_TABLET | Freq: Two times a day (BID) | ORAL | Status: DC

## 2014-04-17 MED ORDER — HEPARIN SODIUM (PORCINE) 1000 UNIT/ML DIALYSIS: 1000.0000 [IU] | INTRAMUSCULAR | Status: DC | PRN

## 2014-04-17 MED ORDER — LIDOCAINE-PRILOCAINE 2.5-2.5 % EX CREA: 1.0000 "application " | TOPICAL_CREAM | CUTANEOUS | Status: DC | PRN

## 2014-04-17 MED ORDER — HEPARIN SODIUM (PORCINE) 1000 UNIT/ML DIALYSIS: 3000.0000 [IU] | Freq: Once | INTRAMUSCULAR | Status: DC

## 2014-04-17 MED ORDER — VANCOMYCIN HCL 250 MG PO CAPS: 500.0000 mg | ORAL_CAPSULE | Freq: Four times a day (QID) | ORAL | Status: DC

## 2014-04-17 MED ORDER — NEPRO/CARBSTEADY PO LIQD
237.0000 mL | ORAL | Status: DC | PRN
  Administered 2014-04-17: 237 mL via ORAL

## 2014-04-17 MED ORDER — CARVEDILOL 3.125 MG PO TABS: 3.1250 mg | ORAL_TABLET | Freq: Two times a day (BID) | ORAL | Status: AC

## 2014-04-17 MED ORDER — VANCOMYCIN HCL 250 MG PO CAPS: 500.0000 mg | ORAL_CAPSULE | Freq: Four times a day (QID) | ORAL | Status: AC

## 2014-04-17 MED ORDER — MIDODRINE HCL 10 MG PO TABS: 10.0000 mg | ORAL_TABLET | Freq: Two times a day (BID) | ORAL | Status: DC

## 2014-04-17 MED ORDER — SODIUM CHLORIDE 0.9 % IV SOLN: 100.0000 mL | INTRAVENOUS | Status: DC | PRN

## 2014-04-17 MED ORDER — RENA-VITE PO TABS
1.0000 | ORAL_TABLET | Freq: Every day | ORAL | Status: DC
  Administered 2014-04-17: 19:00:00 via ORAL

## 2014-04-17 MED ORDER — ALTEPLASE 2 MG IJ SOLR: 2.0000 mg | Freq: Once | INTRAMUSCULAR | Status: DC | PRN

## 2014-04-17 MED ORDER — MIDODRINE HCL 10 MG PO TABS: 10.0000 mg | ORAL_TABLET | Freq: Two times a day (BID) | ORAL | Status: AC

## 2014-04-17 MED ORDER — ASPIRIN EC 325 MG PO TBEC: 325.0000 mg | DELAYED_RELEASE_TABLET | Freq: Every day | ORAL | Status: AC

## 2014-04-17 MED ORDER — RENA-VITE PO TABS: 1.0000 | ORAL_TABLET | Freq: Every day | ORAL | Status: AC

## 2014-04-17 MED ORDER — PENTAFLUOROPROP-TETRAFLUOROETH EX AERO: 1.0000 "application " | INHALATION_SPRAY | CUTANEOUS | Status: DC | PRN

## 2014-04-17 MED ORDER — DOXERCALCIFEROL 4 MCG/2ML IV SOLN
INTRAVENOUS | Status: AC
  Administered 2014-04-17: 1 ug via INTRAVENOUS
  Filled 2014-04-17: qty 2

## 2014-04-17 MED ORDER — LIDOCAINE HCL (PF) 1 % IJ SOLN: 5.0000 mL | INTRAMUSCULAR | Status: DC | PRN

## 2014-04-17 NOTE — Consult Note (Signed)
CARDIOLOGY CONSULT NOTE   Patient ID: Carl Benson MRN: [id number] DOB/AGE: [birth year] 58 y.o.  Admit Date: 04/12/2014  Primary Physician: No PCP Per Patient Primary Cardiologist    New   Delight Bickle  Clinical Summary Carl Benson is a 58 y.o.male. The patient has a multitude of medical problems. Unfortunately noncompliance is a significant issue also. The patient is on dialysis. By history most of his care has been done at the [facility] in [place] in the past. More recently his dialysis is done in [place]. We do not have any significant information about prior cardiac history. There are no old two-dimensional echo was prior to this admission.  The patient was admitted with respiratory failure and sepsis. He has C. difficile. He's been managed expertly and he is improving. Two-dimensional echo does show an ejection fraction of 20-25%. There is some right ventricular dysfunction also. There was very slight troponin elevation with admission. This was felt to be due to demand ischemia.  No Known Allergies  Medications Scheduled Medications: . antiseptic oral rinse  15 mL Mouth Rinse BID  . aspirin  324 mg Oral Daily  . carvedilol  3.125 mg Oral BID WC  . darbepoetin (ARANESP) injection - DIALYSIS  25 mcg Intravenous Q14 Days  . doxercalciferol  1 mcg Intravenous Q M,W,F-HD  . feeding supplement (RESOURCE BREEZE)  1 Container Oral TID BM  . [START ON 04/19/2014] ferric gluconate (FERRLECIT/NULECIT) IV  62.5 mg Intravenous Q Wed-HD  . [START ON 04/18/2014] heparin  3,000 Units Dialysis Once in dialysis  . heparin  5,000 Units Subcutaneous 3 times per day  . insulin aspart  0-15 Units Subcutaneous TID WC  . insulin aspart  0-5 Units Subcutaneous QHS  . vancomycin  500 mg Oral 4 times per day   And  . metronidazole  500 mg Intravenous Q8H  . midodrine  10 mg Oral TID WC  . sodium chloride  3 mL Intravenous Q12H     Infusions: . sodium chloride       PRN Medications:  sodium  chloride, sodium chloride, sodium chloride, alteplase, feeding supplement (NEPRO CARB STEADY), heparin, levalbuterol, lidocaine (PF), lidocaine-prilocaine, ondansetron (ZOFRAN) IV, ondansetron, oxyCODONE, pentafluoroprop-tetrafluoroeth   Past Medical History  Diagnosis Date  . COPD (chronic obstructive pulmonary disease)   . Hypertension   . Hep C w/ coma, chronic   . Irregular heartbeat   . ESRD (end stage renal disease) on dialysis     /notes 11/11/2013  . Smoker unmotivated to quit   . Active smoker   . PTSD (post-traumatic stress disorder)   . Shortness of breath     Past Surgical History  Procedure Laterality Date  . Paracentesis  ~ 10/2013    [name]/notes 11/11/2013    Family History  Problem Relation Age of Onset  . Heart disease Mother   . Hypertension Mother   . Diabetes Mother     Social History Carl Benson reports that he has been smoking Cigarettes.  He has a 7.5 pack-year smoking history. He has never used smokeless tobacco. Carl Benson reports that he does not drink alcohol.  Review of Systems As of today, patient denies fever, chills, headache, sweats, rash, chest pain, cough, nausea vomiting, urinary symptoms. All other systems are reviewed and are negative.  Physical Examination Blood pressure 128/80, pulse 67, temperature 97.4 F (36.3 C), temperature source Oral, resp. rate 20, height 5' 4.96" (1.65 m), weight 131 lb 6.3 oz (59.6 kg), SpO2 95.00%.  Intake/Output Summary (Last 24 hours) at 04/17/14 0945 Last data filed at 04/17/14 0923  Gross per 24 hour  Intake    800 ml  Output      0 ml  Net    800 ml   The patient is thin. He is oriented to person time and place. Affect is normal. Head is atraumatic. He has poor dentition. There is no jugulovenous distention. Lungs reveal a few scattered rhonchi. There is no respiratory distress. Cardiac exam reveals S1 and S2. Abdomen is soft. There is no peripheral edema. There are no musculoskeletal deformities. There are no  skin rashes.  Prior Cardiac Testing/Procedures  Lab Results  Basic Metabolic Panel:  Recent Labs Lab 04/13/14 1145 04/13/14 2330 04/14/14 1400 04/14/14 2345 04/15/14 1800 04/16/14 0430  NA 138 137 132* 137  --  137  K 3.4* 3.6* 3.0* 3.5*  --  3.0*  CL 89* 92* 87* 96  --  96  CO2 19 19 19 25   --  22  GLUCOSE 112* 139* 185* 161*  --  115*  BUN 60* 68* 81* 27*  --  46*  CREATININE 7.92* 8.27* 8.30* 3.63*  --  5.26*  CALCIUM 8.9 8.8 8.8 8.7  --  8.7  PHOS  --   --   --   --  4.3  --     Liver Function Tests:  Recent Labs Lab 04/12/14 1123 04/13/14 0105 04/13/14 0917  AST 68* 120*  --   ALT 47 69*  --   ALKPHOS 112 79  --   BILITOT 1.5* 1.7*  --   PROT 9.5* 8.9*  --   ALBUMIN 4.2 3.5 3.5    CBC:  Recent Labs Lab 04/12/14 0923 04/13/14 0105 04/16/14 0430  WBC 19.2* 15.4* 7.0  HGB 12.7* 13.3 10.0*  HCT 37.0* 39.4 27.0*  MCV 77.2* 77.1* 69.4*  PLT 106* 135* 90*    Cardiac Enzymes:  Recent Labs Lab 04/12/14 1843 04/13/14 0105 04/13/14 0640  TROPONINI 0.61* 0.63* 0.51*    BNP: No components found with this basename: POCBNP,    Radiology: No results found.   ECG:  EKG is done this hospitalization and reviewed by me. The patient has increased voltage. There are old diffuse nonspecific ST-T wave changes. There is no acute change.  Telemetry:   I have reviewed telemetry. There is sinus rhythm.   Impression and Recommendations  The patient is improving. On admission he had respiratory failure and sepsis. He is stable at this time. Noncompliance is a significant issue. It appears that the best overall recommendation is that he take his medications and come to dialysis on his scheduled appointments. Further workup of his cardiomyopathy is not recommended at this time. There is a history of paroxysmal atrial fibrillation. He is not a candidate for anticoagulation. I would also recommend that he not be scheduled with cardiology followup after the  hospitalization. This will just give him one more visit that he is not able to keep. With end-stage renal disease, there is no absolute indication for ACE inhibitors or spironolactone. It would be good to try to keep him on a small dose of carvedilol as tolerated.    Severe sepsis     This was treated along with his acute respiratory failure and C. difficile on admission. He is improving. It is possible that this also played a role with his ejection fraction at the time of the echo.    COPD (chronic obstructive pulmonary disease)  Hep C w/ coma, chronic   Hyperkalemia      All of these issues are being managed.    PAF (paroxysmal atrial fibrillation)     There is a history of paroxysmal atrial fibrillation. He is not a candidate for any type of long-term anticoagulation.    HTN (hypertension)   Hepatic cirrhosis due to chronic hepatitis C infection    PTSD (post-traumatic stress disorder)     Unfortunately this plays a significant role with his health care. From my review it appears that he has been suicidal in the past.     Acute respiratory failure     This was present on admission. He was certainly related to combination of his sepsis and cardiac function. This is now improved.    CHF (congestive heart failure)     His volume status will be managed on dialysis.    ESRD on dialysis     The key to his survival will be to keep his appointments at the dialysis unit.    Troponin level elevated      There was slight troponin elevation when he came in due to demand ischemia. No further workup.    Noncompliance     Unfortunately this plays a significant role with his overall care.    Congestive dilated cardiomyopathy     The patient has an ejection fraction of 20-25%. The recommendation would be to try to keep him on a small dose of carvedilol. Also, regular dialysis for his volume status. Etiology is not known. However no further workup is recommended. Low dose aspirin would be very  reasonable in his case. There would be no reason to use an ACE inhibitor or spironolactone.    C. difficile diarrhea      This is being treated.  Jerral Bonito, MD  04/17/2014, 9:45 AM

## 2014-04-17 NOTE — Progress Notes (Signed)
At around 6am, patient complained of left upper chest pain, dull and 7 on a pain scale of 0-10. Oxycodone IR 5mg  given p.o. at 0617, 12 leads EKG done twice. Patient is conscious, alert and conversant. Patient was reassessed after 15 minutes, and stated that his chest pain "is almost gone". When asked to rate his pain, patient stated "almost a zero". Paged the attending Critical Care MD c/o Dr. Marchelle Gearing at around 6:30am, made him aware about the patient's condition, that pain medicine has been given orally and EKG was done despite there is no order and relayed him the result, which was 'Prolonged QT, and ST and T wave abnormality, consider anterolateral ischemia'. Patient asked to rest on his bed but insisted to take a walk. Will continue to monitor patient. Abram Sander, RN

## 2014-04-17 NOTE — Progress Notes (Signed)
eLink Physician-Brief Progress Note Patient Name: Carl Benson DOB: 07-06-1956 MRN: [id number]  Date of Service  04/17/2014   HPI/Events of Note  Patient had atypical hchest pain that resolved per RN   eICU Interventions  12 lead ekg  without change (has T wave inversion lateral leads) but has new QTc proloongation  Monitor Bedside MD to address EKG changes of high Qtc   Intervention Category Intermediate Interventions: Diagnostic test evaluation  Kalman Shan 04/17/2014, 6:35 AM

## 2014-04-17 NOTE — Discharge Instructions (Signed)
Clostridium Difficile Infection Clostridium difficile (C. difficile) is a bacteria found in the intestinal tract or colon. Under certain conditions, it causes diarrhea and sometimes severe disease. The severe form of the disease is known as pseudomembranous colitis (often called C. difficile colitis). This disease can damage the lining of the colon or cause the colon to become enlarged (toxic megacolon).  CAUSES  Your colon normally contains many different bacteria, including C. difficile. The balance of bacteria in your colon can change during illness. This is especially true when you take antibiotic medicine. Taking antibiotics may allow the C. difficile to grow, multiply excessively, and make a toxin that then causes illness. The elderly and people with certain medical conditions have a greater risk of getting C. difficile infections. SYMPTOMS   Watery diarrhea.  Fever.  Fatigue.  Loss of appetite.  Nausea.  Abdominal swelling, pain, or tenderness.  Dehydration. DIAGNOSIS  Your symptoms may make your caregiver suspicious of a C. difficile infection, especially if you have used antibiotics in the preceding weeks. However, there are only 2 ways to know for certain whether you have a C. difficile infection:  A lab test that finds the toxin in your stool.  The specific appearance of an abnormality (pseudomembrane) in your colon. This can only be seen by doing a sigmoidoscopy or colonoscopy. These procedures involve passing an instrument through your rectum to look at the inside of your colon. Your caregiver will help determine if these tests are necessary. TREATMENT   Most people are successfully treated with one of two specific antibiotics, usually given by mouth. Other antibiotics you are receiving are stopped if possible.  Intravenous (IV) fluids and correction of electrolyte imbalance may be necessary.  Rarely, surgery may be needed to remove the infected part of the  intestines.  Careful hand washing by you and your caregivers is important to prevent the spread of infection. In the hospital, your caregivers may also put on gowns and gloves to prevent the spread of the C. difficile bacteria. Your room is also cleaned regularly with a solution containing bleach or a product that is known to kill C. difficile. HOME CARE INSTRUCTIONS  Drink enough fluids to keep your urine clear or pale yellow. Avoid milk, caffeine, and alcohol.  Ask your caregiver for specific rehydration instructions.  Try eating small, frequent meals rather than large meals.  Take your antibiotics as directed. Finish them even if you start to feel better.  Do not use medicines to slow diarrhea. This could delay healing or cause complications.  Wash your hands thoroughly after using the bathroom and before preparing food.  Make sure people who live with you wash their hands often, too.  Carefully disinfect all surfaces with a product that contains chlorine bleach. SEEK MEDICAL CARE IF:  Diarrhea persists longer than expected or recurs after completing your course of antibiotic treatment for the C. difficile infection.  You have trouble staying hydrated. SEEK IMMEDIATE MEDICAL CARE IF:  You develop a new fever.  You have increasing abdominal pain or tenderness.  There is blood in your stools, or your stools are dark black and tarry.  You cannot hold down food or liquids. MAKE SURE YOU:   Understand these instructions.  Will watch your condition.  Will get help right away if you are not doing well or get worse. Document Released: 08/06/2005 Document Revised: 02/21/2013 Document Reviewed: 04/04/2011 [employer] Patient Information 2014 [employer]  

## 2014-04-17 NOTE — Progress Notes (Signed)
Discharge instructions and medications discussed with pt. Pt showed no barriers to discharge. IV removed; catheter intact. Tele removed. Assessment unchanged from morning. Medications for patient given to family member with instructions for administration.  Pt discharged to home with family by wheelchair.

## 2014-04-17 NOTE — Discharge Summary (Addendum)
[facility]  Physician Discharge Summary   Patient ID: Carl Benson MRN: [id number] DOB/AGE: 1956/06/15 58 y.o.  Admit date: 04/12/2014 Discharge date: 04/17/2014  PCP: No PCP Per Patient  DISCHARGE DIAGNOSES:  Principal Problem:   Severe sepsis Active Problems:   COPD (chronic obstructive pulmonary disease)   Hep C w/ coma, chronic   Hyperkalemia   PAF (paroxysmal atrial fibrillation)   HTN (hypertension)   Hepatic cirrhosis due to chronic hepatitis C infection   PTSD (post-traumatic stress disorder)   Dyspnea   Fever   Acute respiratory failure   Hypotension, unspecified   CHF (congestive heart failure)   ESRD on dialysis   Troponin level elevated   Noncompliance   Congestive dilated cardiomyopathy   C. difficile diarrhea   RECOMMENDATIONS FOR OUTPATIENT FOLLOW UP: 1. Dialysis TTS  DISCHARGE CONDITION: fair  Diet recommendation: Heart Healthy  Filed Weights   04/16/14 0426 04/16/14 2156 04/17/14 0500  Weight: 58.7 kg (129 lb 6.6 oz) 59.648 kg (131 lb 8 oz) 59.6 kg (131 lb 6.3 oz)    INITIAL HISTORY: 58 year old male with ESRD on HD, COPD, and PAF. Presented to ED 6/3 with dyspnea after missing HD. Transferred to ICU with fever, hypotension. He also developed rapid atrial fibrillation. He was found to have c diff. He was started on antibiotics. He required pressors. He was then transferred to the floor.  Consultations:  PCCM  Cardiology  Nephrology  SIGNIFICANT EVENTS / STUDIES:  6/3 Admitted with dyspnea after missing HD  6/4 Fever, hypotension, transferred to ICU  6/4 C. Diff POS  6/4 TTE >>> EF 20%, moderatelly dilated RV, moderate TR, mild pulmonary hypertension.  LINES / TUBES:  LIJ CVL 6/4 >>> 6/7  L R AL 6/3 >>> 6/6   CULTURES:  6/3 Blood >>> no growth 6/3 C.Diff PCR >>> POSITIVE   ANTIBIOTICS:  Vancomycin oral 6/3 >>>  Flagyl 6/4 >>> 6/8 Cefepime 6/3 >>> 6/4   HOSPITAL COURSE:   Acute respiratory failure in setting of  pulmonary edema This occurred primarily due to non compliance with dialysis. This has resolved with hemodialysis. He was on bipap initially.  Septic shock in setting of pseudomembranous colitis / hypovolemia This has resolved. He has been off of pressors. Central line was placed and then removed.  C. Diff colitis  This was the likely reason for his sepsis. He was placed on oral Vanc due to severe sepsis. Will need for 14 days as this is his first episode.  Paroxysmal Atrial Fibrillation Likely due to sepsis. Now in SR. Continue BB.  Acute Systolic Heart Failure with Moderate TR EF was noted to be 20% on ECHO. Seen by cardiology and only recommendation is Coreg and aspirin. No ACEI. No further work up at this time. He can follow with his providers at the [place].  Baseline hypotension Midodrine was initiated to assist with dialysis.  ESRD on HD  Nephrology was consulted. He is on TTS dialysis regimen as OP. But here he was MWF. He will be dialysed today. He was asked to go for next dialysis on Thursday by Nephrology.  COPD without evidence exacerbation  Stable  Chronic hepatitis C  Follow up at [facility]  Anemia of renal disease and Thrombocytopenia  Stable  Acute Encephalopathy Due to sepsis. Now resolved.  Overall improved. He wants to go home. Denies any diarrhea. Denies chest pain or shortness of breath. Has been ambulating in the hallway. Discharge after dialysis today.  PERTINENT LABS: The results  of significant diagnostics from this hospitalization (including imaging, microbiology, ancillary and laboratory) are listed below for reference.    Microbiology: Recent Results (from the past 240 hour(s))  CULTURE, BLOOD (ROUTINE X 2)     Status: None   Collection Time    04/12/14  4:35 PM      Result Value Ref Range Status   Specimen Description BLOOD   Final   Special Requests     Final   Value: BOTTLES DRAWN AEROBIC AND ANAEROBIC 10CC DIALYSIS AVG   Culture  Setup Time     Final     Value: 04/12/2014 18:55     Performed at [facility]   Culture     Final   Value:        BLOOD CULTURE RECEIVED NO GROWTH TO DATE CULTURE WILL BE HELD FOR 5 DAYS BEFORE ISSUING A FINAL NEGATIVE REPORT     Performed at [facility]   Report Status PENDING   Incomplete  CULTURE, BLOOD (ROUTINE X 2)     Status: None   Collection Time    04/12/14  4:50 PM      Result Value Ref Range Status   Specimen Description BLOOD   Final   Special Requests     Final   Value: BOTTLES DRAWN AEROBIC AND ANAEROBIC 10CC DIALYSIS AVG   Culture  Setup Time     Final   Value: 04/13/2014 00:43     Performed at [facility]   Culture     Final   Value:        BLOOD CULTURE RECEIVED NO GROWTH TO DATE CULTURE WILL BE HELD FOR 5 DAYS BEFORE ISSUING A FINAL NEGATIVE REPORT     Performed at [facility]   Report Status PENDING   Incomplete  MRSA PCR SCREENING     Status: None   Collection Time    04/12/14  5:41 PM      Result Value Ref Range Status   MRSA by PCR NEGATIVE  NEGATIVE Final   Comment:            The GeneXpert MRSA Assay (FDA     approved for NASAL specimens     only), is one component of a     comprehensive MRSA colonization     surveillance program. It is not     intended to diagnose MRSA     infection nor to guide or     monitor treatment for     MRSA infections.  CLOSTRIDIUM DIFFICILE BY PCR     Status: Abnormal   Collection Time    04/13/14  7:50 AM      Result Value Ref Range Status   C difficile by pcr POSITIVE (*) NEGATIVE Final   Comment: CRITICAL RESULT CALLED TO, READ BACK BY AND VERIFIED WITH:     BRADY RN 9:50 04/13/14 ([email or web address])     Labs: Basic Metabolic Panel:  Recent Labs Lab 04/13/14 1145 04/13/14 2330 04/14/14 1400 04/14/14 2345 04/15/14 1800 04/16/14 0430  NA 138 137 132* 137  --  137  K 3.4* 3.6* 3.0* 3.5*  --  3.0*  CL 89* 92* 87* 96  --  96  CO2 19 19 19 25   --  22  GLUCOSE 112* 139* 185* 161*  --  115*  BUN 60* 68* 81*  27*  --  46*  CREATININE 7.92* 8.27* 8.30* 3.63*  --  5.26*  CALCIUM 8.9 8.8 8.8 8.7  --  8.7  PHOS  --   --   --   --  4.3  --    Liver Function Tests:  Recent Labs Lab 04/12/14 1123 04/13/14 0105 04/13/14 0917  AST 68* 120*  --   ALT 47 69*  --   ALKPHOS 112 79  --   BILITOT 1.5* 1.7*  --   PROT 9.5* 8.9*  --   ALBUMIN 4.2 3.5 3.5    Recent Labs Lab 04/12/14 1843 04/13/14 1300  AMMONIA 45 31   CBC:  Recent Labs Lab 04/12/14 0923 04/13/14 0105 04/16/14 0430 04/17/14 0902  WBC 19.2* 15.4* 7.0 8.7  HGB 12.7* 13.3 10.0* 10.9*  HCT 37.0* 39.4 27.0* 29.2*  MCV 77.2* 77.1* 69.4* 69.2*  PLT 106* 135* 90* 106*   Cardiac Enzymes:  Recent Labs Lab 04/12/14 1843 04/13/14 0105 04/13/14 0640  TROPONINI 0.61* 0.63* 0.51*   BNP: BNP (last 3 results)  Recent Labs  01/06/14 2025 01/14/14 0826 04/12/14 1123  PROBNP >70000.0* >70000.0* >70000.0*   CBG:  Recent Labs Lab 04/16/14 1109 04/16/14 1620 04/16/14 2148 04/17/14 0749 04/17/14 1126  GLUCAP 145* 170* 130* 143* 119*     IMAGING STUDIES Dg Abd 1 View  04/13/2014   CLINICAL DATA:  Abdominal distention.  Pain and constipation.  EXAM: ABDOMEN - 1 VIEW  COMPARISON:  01/07/2014  FINDINGS: There are several dilated loops of small bowel identified. The largest is in the left lower quadrant measuring up to 4.3 cm. A paucity of colonic gas is noted. There is a small amount of gas noted in the rectum.  IMPRESSION: 1. Dilated small bowel loops which may reflect recurrent obstruction.   Electronically Signed   By: Signa Kell M.D.   On: 04/13/2014 11:38   Nm Pulmonary Perf And Vent  04/13/2014   CLINICAL DATA:  58 year old male with shortness of breath and chest pain.  EXAM: NUCLEAR MEDICINE VENTILATION - PERFUSION LUNG SCAN  TECHNIQUE: Ventilation images were obtained in multiple projections using inhaled aerosol technetium 99 M DTPA. Perfusion images were obtained in multiple projections after intravenous  injection of Tc-67m MAA.  RADIOPHARMACEUTICALS:  40.0 mCi Tc-58m DTPA aerosol and 6.0 mCi Tc-54m MAA  COMPARISON:  04/13/2014 chest radiograph  FINDINGS: Ventilation: Some clumping of the DTPA is noted. No focal ventilation defects are identified.  Perfusion: No wedge shaped peripheral perfusion defects to suggest acute pulmonary embolism.  IMPRESSION: Unremarkable pulmonary perfusion - No evidence of pulmonary emboli.   Electronically Signed   By: Laveda Abbe M.D.   On: 04/13/2014 16:44   Dg Chest Port 1 View  04/13/2014   CLINICAL DATA:  Central venous line.  EXAM: PORTABLE CHEST - 1 VIEW  COMPARISON:  04/13/2014.  FINDINGS: New left IJ central line is present with the tip in the mid SVC. There is no pneumothorax. Faint airspace disease is present in the left lung which may represent asymmetric pulmonary edema or pneumonia. The appearance is more suggestive of edema. The cardiopericardial silhouette remains enlarged. On yesterday's exam, there appeared to be more prominent interstitial markings suggesting decreasing interstitial pulmonary edema on today's exam.  IMPRESSION: 1. Uncomplicated new left IJ central line with the tip in the mid SVC. 2. Cardiomegaly with mild airspace disease at the left base, likely representing pulmonary edema.   Electronically Signed   By: Andreas Newport M.D.   On: 04/13/2014 11:37   Dg Chest Port 1 View  04/13/2014   CLINICAL DATA:  fever, leukocytosis, resp failure  EXAM:  PORTABLE CHEST - 1 VIEW  COMPARISON:  Portable chest 04/12/2014  FINDINGS: Stable cardiomegaly. No focal regions of consolidation or focal infiltrates. Area of discoid atelectasis along the periphery of the left lower lobe. No focal regions of consolidation or focal infiltrates. Minimal blunting of the left costophrenic angle. No acute osseous abnormalities.  IMPRESSION: Discoid atelectasis otherwise stable chest radiograph.   Electronically Signed   By: Salome Holmes M.D.   On: 04/13/2014 07:34   Dg Chest  Port 1 View  04/12/2014   CLINICAL DATA:  Shortness of Breath  EXAM: PORTABLE CHEST - 1 VIEW  COMPARISON:  January 14, 2014  FINDINGS: There is no edema or consolidation. Heart is enlarged with a degree of pulmonary venous hypertension. No adenopathy. Evidence of prior right clavicle fracture.  IMPRESSION: Cardiomegaly with pulmonary venous hypertension. Suspect early volume overload. No frank edema or consolidation, however.   Electronically Signed   By: Bretta Bang M.D.   On: 04/12/2014 09:26   Dg Abd Portable 1v  04/14/2014   CLINICAL DATA:  Small bowel obstruction.  EXAM: PORTABLE ABDOMEN - 1 VIEW  COMPARISON:  04/13/2014  FINDINGS: Mild gaseous prominence of small bowel remains. Some air is now also present throughout most of the colon. Findings are more consistent with ileus rather than overt small bowel obstruction.  IMPRESSION: Mild gaseous prominence of small bowel and colon. Findings are suggestive of mild diffuse ileus.   Electronically Signed   By: Irish Lack M.D.   On: 04/14/2014 11:51    DISCHARGE EXAMINATION: Filed Vitals:   04/16/14 2156 04/17/14 0500 04/17/14 0552 04/17/14 0919  BP: 108/67  132/80 128/80  Pulse: 58  73 67  Temp: 97.5 F (36.4 C)  97.8 F (36.6 C) 97.4 F (36.3 C)  TempSrc: Oral  Oral Oral  Resp: 22  20 20   Height: 5' 4.96" (1.65 m)     Weight: 59.648 kg (131 lb 8 oz) 59.6 kg (131 lb 6.3 oz)    SpO2: 95%  96% 95%   General appearance: alert, cooperative, appears stated age and no distress Resp: few crackles at bases. no wheezing. Cardio: regular rate and rhythm, S1, S2 normal, no murmur, click, rub or gallop GI: soft, non-tender; bowel sounds normal; no masses,  no organomegaly Extremities: mild edema Neurologic: No focal deficits  DISPOSITION: Home with ex-wife  Discharge Instructions   Diet - low sodium heart healthy    Complete by:  As directed      Discharge instructions    Complete by:  As directed   Go for dialysis on Thursday as  instructed. Complete the course of your antibiotics even if you dont have diarrhea.     Increase activity slowly    Complete by:  As directed            ALLERGIES: No Known Allergies  Current Discharge Medication List    START taking these medications   Details  aspirin EC 325 MG tablet Take 1 tablet (325 mg total) by mouth daily. Qty: 30 tablet, Refills: 0    carvedilol (COREG) 3.125 MG tablet Take 1 tablet (3.125 mg total) by mouth 2 (two) times daily with a meal. Qty: 60 tablet, Refills: 0    midodrine (PROAMATINE) 10 MG tablet Take 1 tablet (10 mg total) by mouth 2 (two) times daily with a meal. Qty: 60 tablet, Refills: 0    multivitamin (RENA-VIT) TABS tablet Take 1 tablet by mouth at bedtime. Qty: 30 tablet, Refills: 0  vancomycin (VANCOCIN) 250 MG capsule Take 2 capsules (500 mg total) by mouth 4 (four) times daily. For 10 more days Qty: 40 capsule, Refills: 0       Follow-up Information   Schedule an appointment as soon as possible [facility] (post hospitalization follow [facility])       TOTAL DISCHARGE TIME: 35 mins  Osvaldo ShipperGokul Ferrell Flam  [facility] Pager [phone]  04/17/2014, 1:15 PM  Disclaimer: This note was dictated with voice recognition software. Similar sounding words can inadvertently be transcribed and may not be corrected upon review.

## 2014-04-17 NOTE — Progress Notes (Addendum)
[facility] Progress Note  Assessment/Plan: 1. Septic shock, cdiff + afeb now; BP stable; on IV metronidazole and po Vanc 2. ESRD -  TTS - last HD was Friday - he was off schedule upon admission and got on a MWF schedule here - plan HD today - he could skip his Tuesday HD IF he runs his full treatment today; if he signs off early, he should go to his TTS dialysis treatement as he notoriously signs off early and skips treatments. 3. Anemia -  Hgb 10.9; continue Aranesp 25 q 2 weeks - last dose 6/3 4. Secondary hyperparathyroidism - Hectorol ; P controlled 5. HTN/volume - + 400 on 6/5; BP better volume - up should be able to get more off after tx of sepsis/midodrine for BP support/on low dose BB  6. Nutrition - renal/carb mod diet - resource/vit 7. Thrombocytopenia - improving 8. Hep C +  9. PAF - in SR/non coumadin candidate 10.  CM - EF 20 - 25% - cards rec low dose asa and low dose coreg 11. PTSD 12. Medical and dialysis noncompliance - compliance with HD tmts and medical follow up critical   Sheffield Slider, PA-C [facility] Beeper [phone] 04/17/2014,10:50 AM  LOS: 5 days   Subjective:   Eating pretzels, no c/o  Objective Filed Vitals:   04/16/14 2156 04/17/14 0500 04/17/14 0552 04/17/14 0919  BP: 108/67  132/80 128/80  Pulse: 58  73 67  Temp: 97.5 F (36.4 C)  97.8 F (36.6 C) 97.4 F (36.3 C)  TempSrc: Oral  Oral Oral  Resp: 22  20 20   Height: 5' 4.96" (1.65 m)     Weight: 59.648 kg (131 lb 8 oz) 59.6 kg (131 lb 6.3 oz)    SpO2: 95%  96% 95%   Physical Exam General: sitting in chair Heart: RRR Lungs: poor expansion no rales Abdomen: soft NT Extremities: 1= LE edema Dialysis Access: right upper AVGG + bruit  Dialysis Orders:  TTS AF 3.5h F160 55kg 2/2.0 [place] Heparin 3000 RUA AVG  Hect 1 ug tiw Aranesp 25 ug q other wk (last was supposed to be 6/2 but pt no-showed) Fe 50/wk    Additional Objective Labs: Basic Metabolic  Panel:  Recent Labs Lab 04/14/14 1400 04/14/14 2345 04/15/14 1800 04/16/14 0430  NA 132* 137  --  137  K 3.0* 3.5*  --  3.0*  CL 87* 96  --  96  CO2 19 25  --  22  GLUCOSE 185* 161*  --  115*  BUN 81* 27*  --  46*  CREATININE 8.30* 3.63*  --  5.26*  CALCIUM 8.8 8.7  --  8.7  PHOS  --   --  4.3  --    Liver Function Tests:  Recent Labs Lab 04/12/14 1123 04/13/14 0105 04/13/14 0917  AST 68* 120*  --   ALT 47 69*  --   ALKPHOS 112 79  --   BILITOT 1.5* 1.7*  --   PROT 9.5* 8.9*  --   ALBUMIN 4.2 3.5 3.5   CBC:  Recent Labs Lab 04/12/14 0923 04/13/14 0105 04/16/14 0430 04/17/14 0902  WBC 19.2* 15.4* 7.0 8.7  HGB 12.7* 13.3 10.0* 10.9*  HCT 37.0* 39.4 27.0* 29.2*  MCV 77.2* 77.1* 69.4* 69.2*  PLT 106* 135* 90* 106*   Blood Culture    Component Value Date/Time   SDES STOOL 04/13/2014 0810   SPECREQUEST NONE 04/13/2014 0810   CULT  Value:  BLOOD CULTURE RECEIVED NO GROWTH TO DATE CULTURE WILL BE HELD FOR 5 DAYS BEFORE ISSUING A FINAL NEGATIVE REPORT [facility] 04/12/2014 1650   REPTSTATUS 04/14/2014 FINAL 04/13/2014 0810    Cardiac Enzymes:  Recent Labs Lab 04/12/14 1843 04/13/14 0105 04/13/14 0640  TROPONINI 0.61* 0.63* 0.51*   CBG:  Recent Labs Lab 04/16/14 0812 04/16/14 1109 04/16/14 1620 04/16/14 2148 04/17/14 0749  GLUCAP 122* 145* 170* 130* 143*   Medications: . sodium chloride     . antiseptic oral rinse  15 mL Mouth Rinse BID  . aspirin  324 mg Oral Daily  . carvedilol  3.125 mg Oral BID WC  . darbepoetin (ARANESP) injection - DIALYSIS  25 mcg Intravenous Q14 Days  . doxercalciferol  1 mcg Intravenous Q M,W,F-HD  . feeding supplement (RESOURCE BREEZE)  1 Container Oral TID BM  . [START ON 04/19/2014] ferric gluconate (FERRLECIT/NULECIT) IV  62.5 mg Intravenous Q Wed-HD  . [START ON 04/18/2014] heparin  3,000 Units Dialysis Once in dialysis  . heparin  5,000 Units Subcutaneous 3 times per day  . insulin aspart   0-15 Units Subcutaneous TID WC  . insulin aspart  0-5 Units Subcutaneous QHS  . vancomycin  500 mg Oral 4 times per day   And  . metronidazole  500 mg Intravenous Q8H  . midodrine  10 mg Oral TID WC  . sodium chloride  3 mL Intravenous Q12H    I have seen and examined this patient and agree with plan per Bard HerbertMarty Kemper Heupel.  He continues to be his own worse enemy with his non compliance.  Jorje GuildMichael T Mattingly,MD 04/17/2014 11:07 AM

## 2014-04-18 LAB — CULTURE, BLOOD (ROUTINE X 2): Culture: NO GROWTH

## 2014-04-19 LAB — CULTURE, BLOOD (ROUTINE X 2): CULTURE: NO GROWTH

## 2014-04-19 LAB — DRUG SCREEN PANEL (SERUM)

## 2014-05-17 IMAGING — US US PARACENTESIS
1 series · 5 of 5 positions shown · non-contrast
Comparison: none

CLINICAL DATA: Ascites

EXAM:
ULTRASOUND GUIDED PARACENTESIS
TECHNIQUE: Survey ultrasound of the abdomen was performed and an appropriate
skin entry site in the LLQ abdomen was selected. Skin site was
marked, prepped with Betadine, and draped in usual sterile fashion,
and infiltrated locally with 1% lidocaine. A 5 French multisidehole
Onnen Da Solly Baemi needle was advanced into the peritoneal space until
fluid could be aspirated. The sheath was advanced and the needle
removed. 1.2 liters of yellow ascites were aspirated. No immediate
complication.

[Series 1: us paracentesis · 0.27mm/px · 5 of 5 slices shown]
[im 1/5]
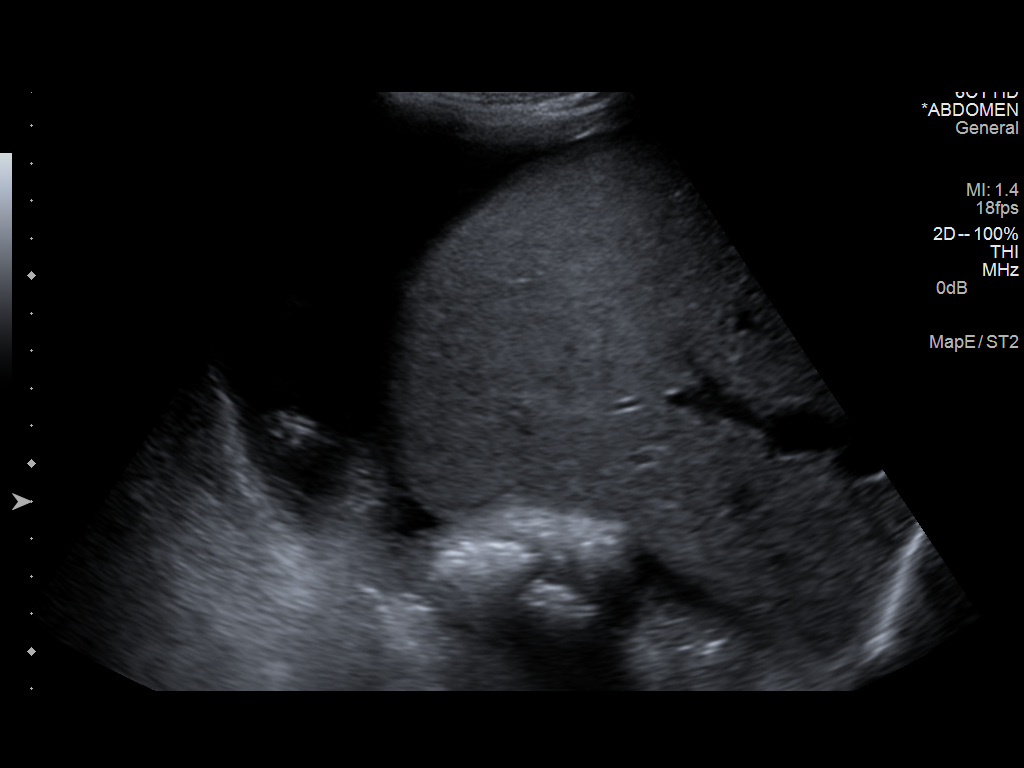
[im 2/5]
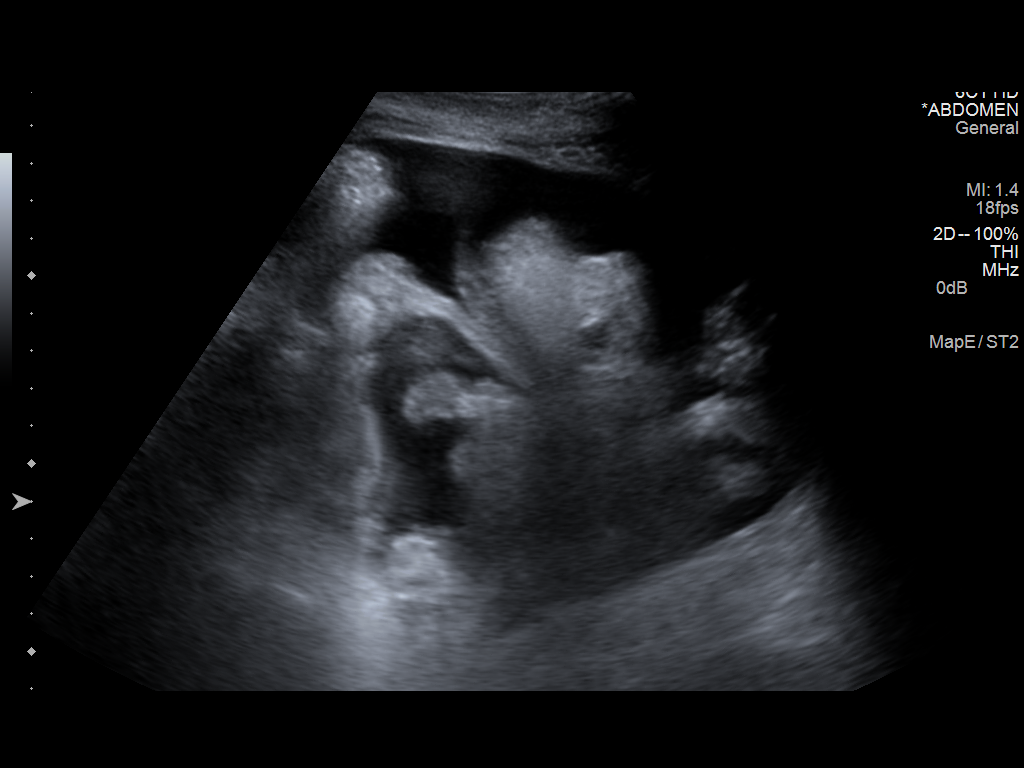
[im 3/5]
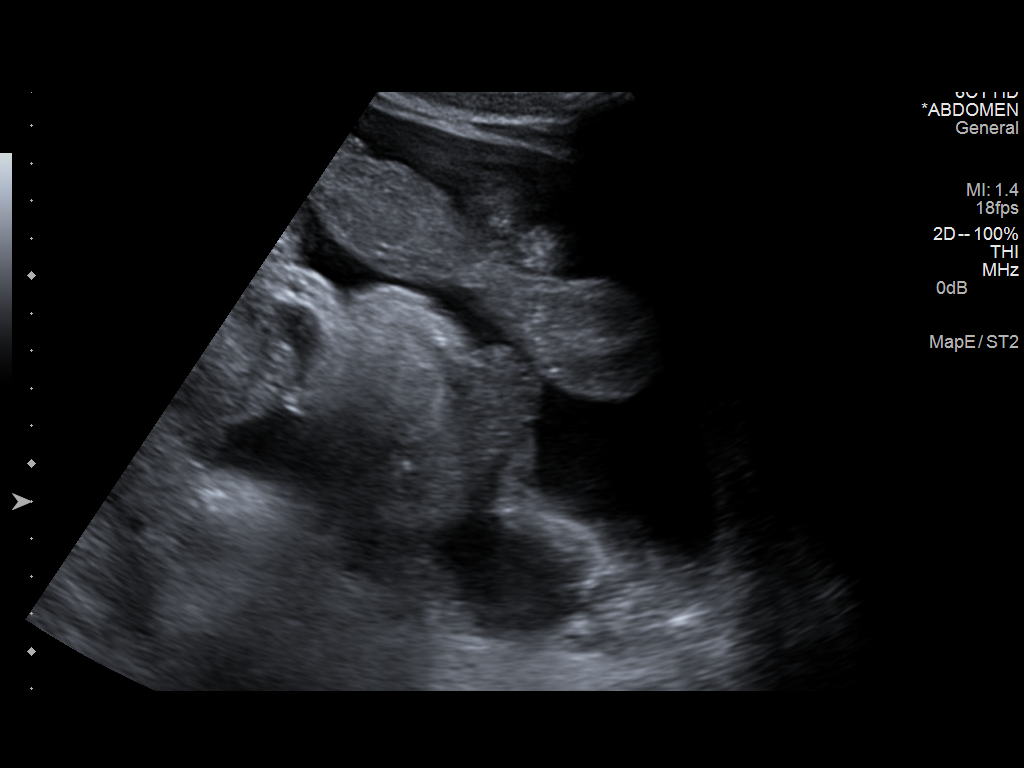
[im 4/5]
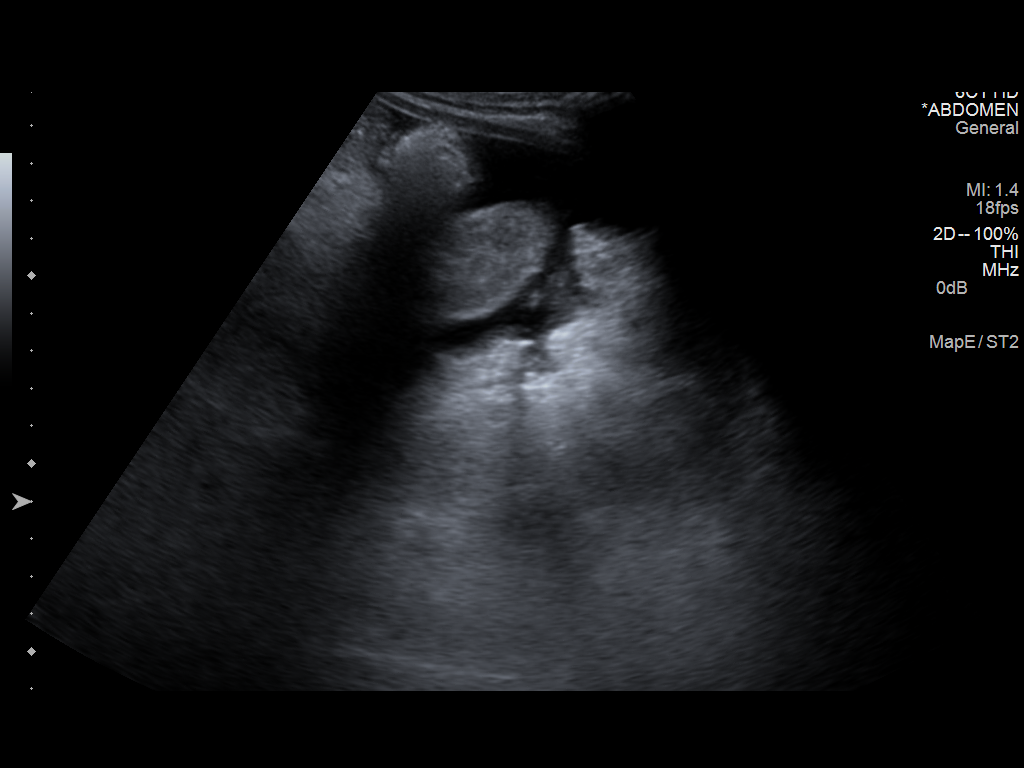
[im 5/5]
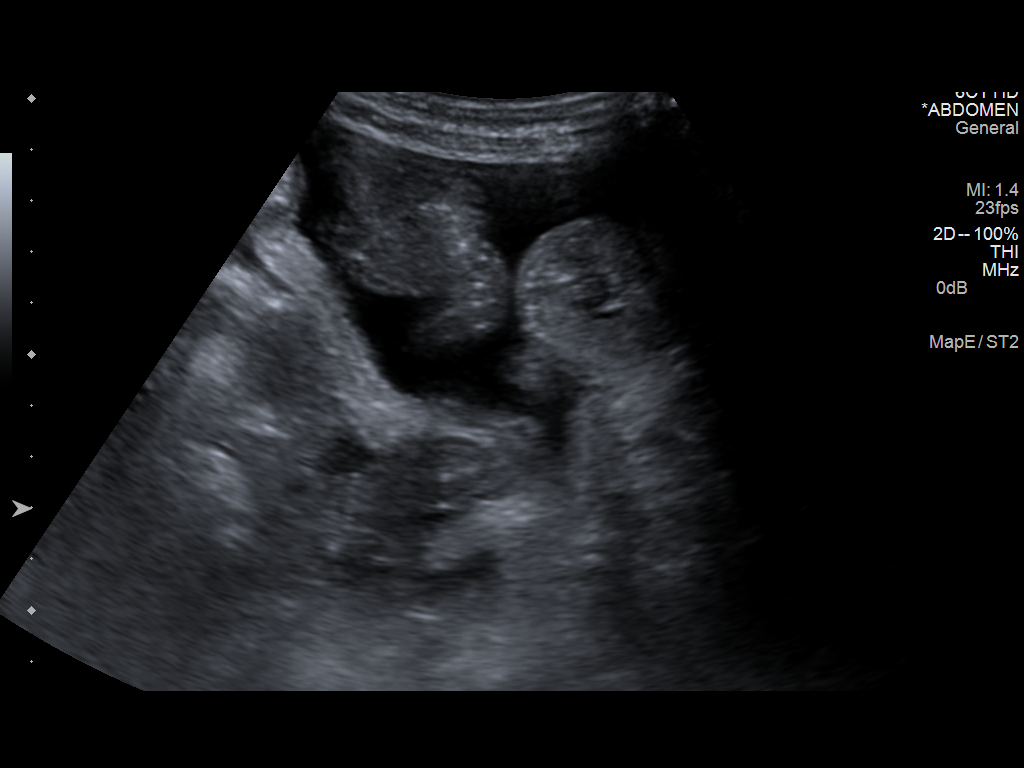

[5 of 5 positions shown; findings below may reference images not displayed]

IMPRESSION: Technically successful ultrasound guided paracentesis, removing
liters of ascites.

## 2014-05-27 IMAGING — DX DG CHEST 1V PORT
1 series · 1 of 1 positions shown · non-contrast
Comparison: December 12, 2013

CLINICAL DATA: Shortness of breath

EXAM:
PORTABLE CHEST - 1 VIEW

[portable]
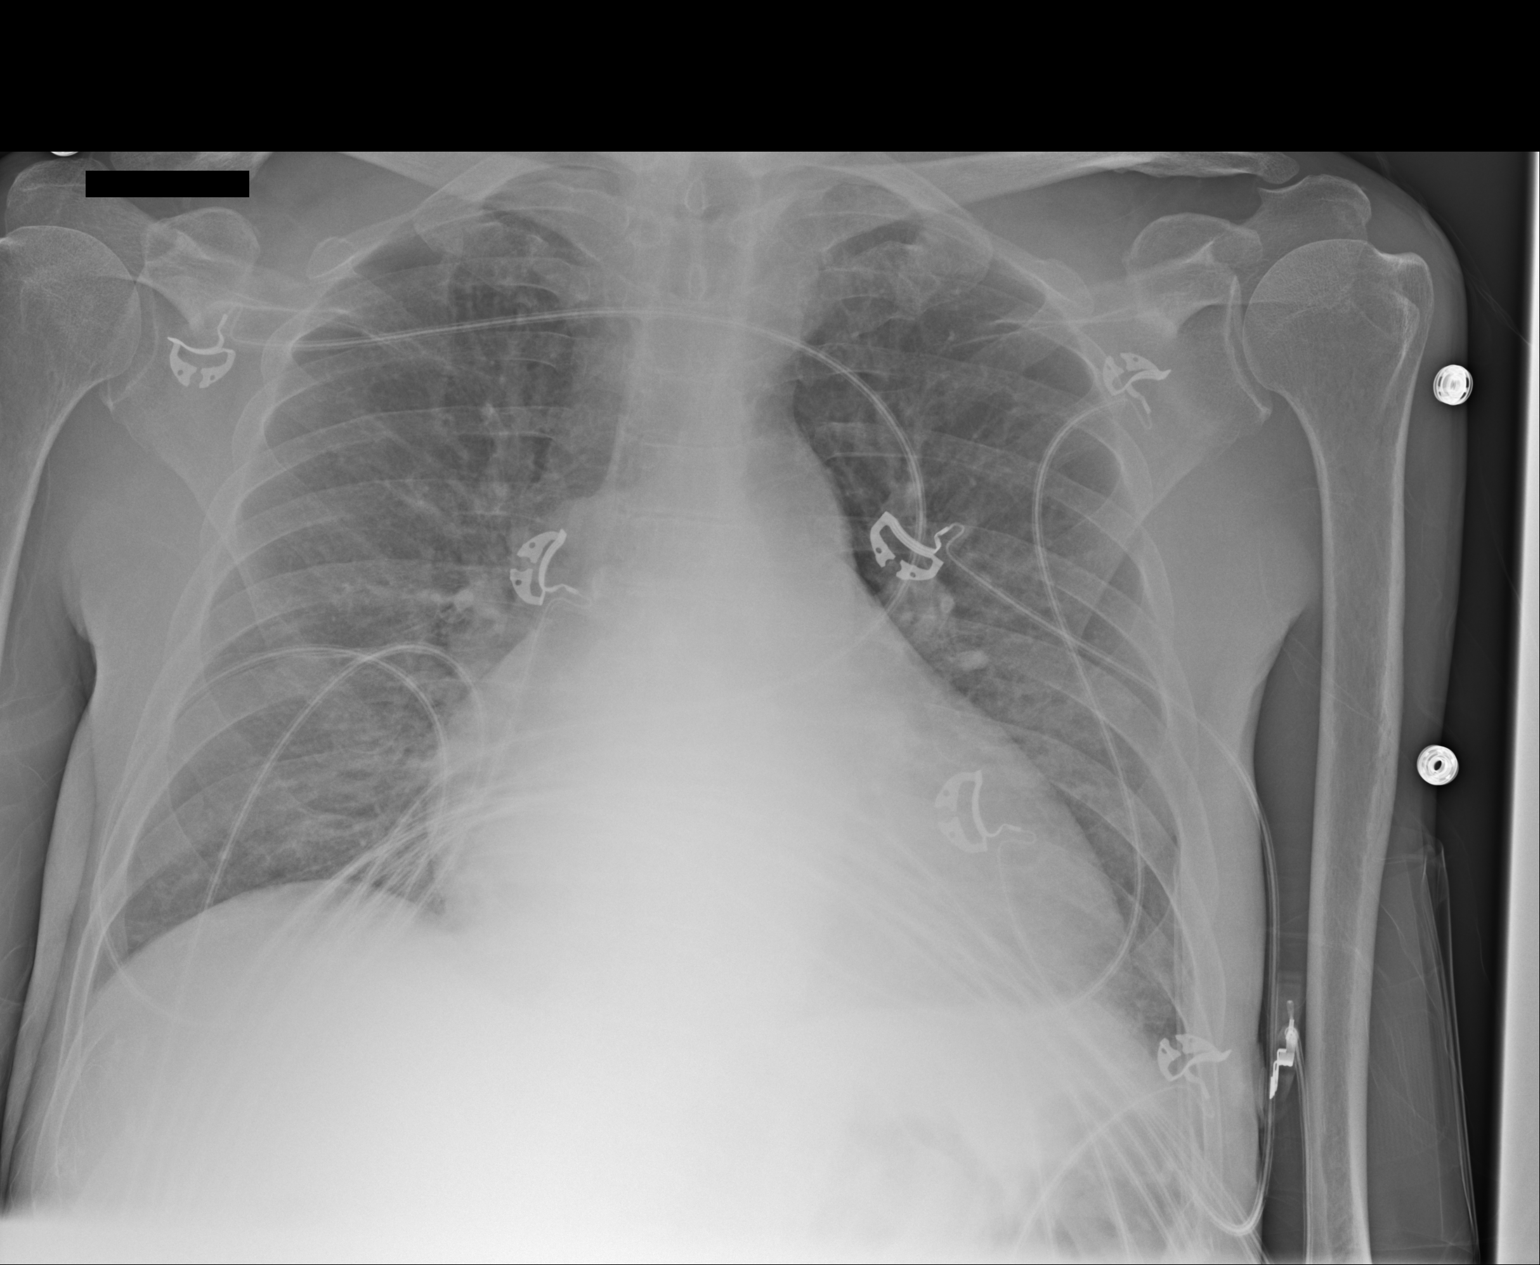

[1 of 1 positions shown; findings below may reference images not displayed]

FINDINGS: There is no edema or consolidation. Heart is enlarged with normal
pulmonary vascularity. No adenopathy. No bone lesions.
IMPRESSION: Cardiomegaly.  No edema or consolidation.

## 2014-06-13 IMAGING — CR DG CHEST 1V PORT
1 series · 1 of 1 positions shown · non-contrast
Comparison: 12/30/2013 and prior chest radiographs

CLINICAL DATA: 57-year-old male with shortness of breath. History
of COPD and end-stage renal disease.

EXAM:
PORTABLE CHEST - 1 VIEW

[AP]
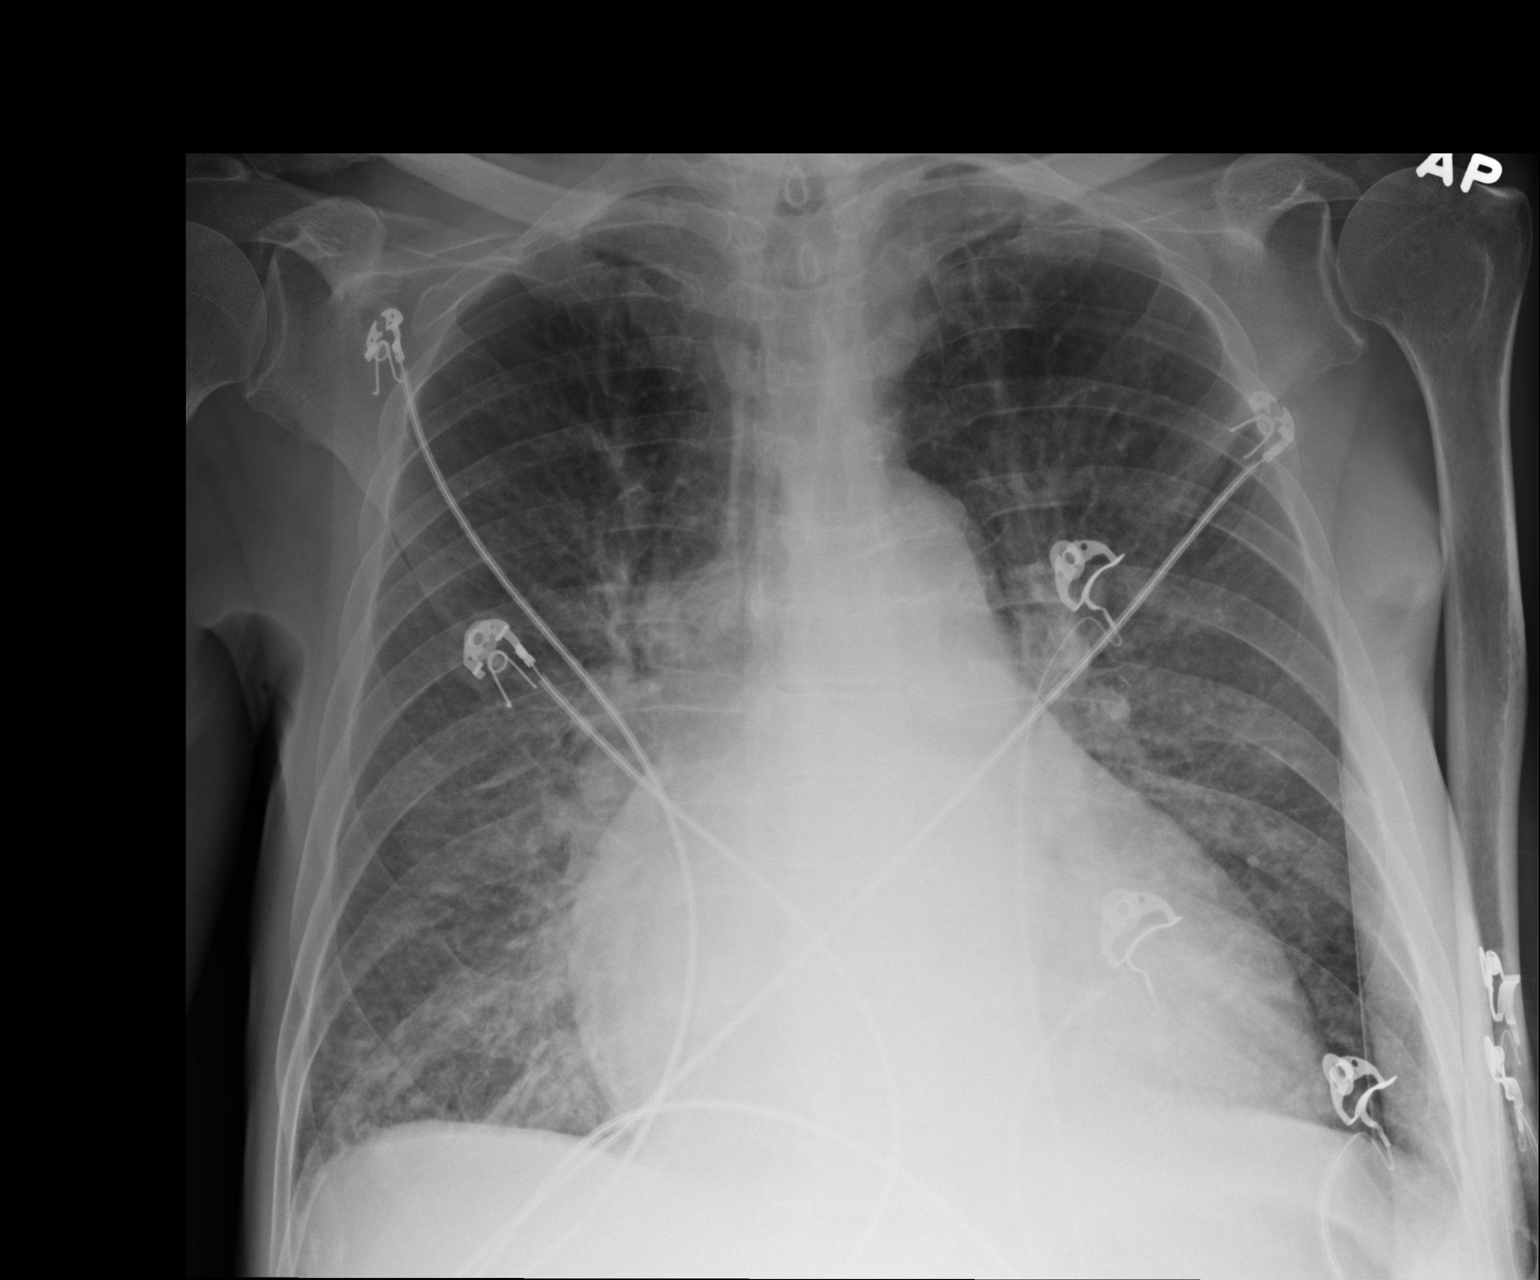

[1 of 1 positions shown; findings below may reference images not displayed]

FINDINGS: Cardiomegaly and interstitial pulmonary edema noted.

There is no evidence of focal airspace disease, suspicious pulmonary
nodule/mass, pleural effusion, or pneumothorax. No acute bony
abnormalities are identified. Is
IMPRESSION: Cardiomegaly with interstitial pulmonary edema.

## 2014-09-19 IMAGING — CR DG ABDOMEN 1V
1 series · 1 of 1 positions shown · non-contrast
Comparison: 01/07/2014

CLINICAL DATA: Abdominal distention.  Pain and constipation.

EXAM:
ABDOMEN - 1 VIEW

[AP]
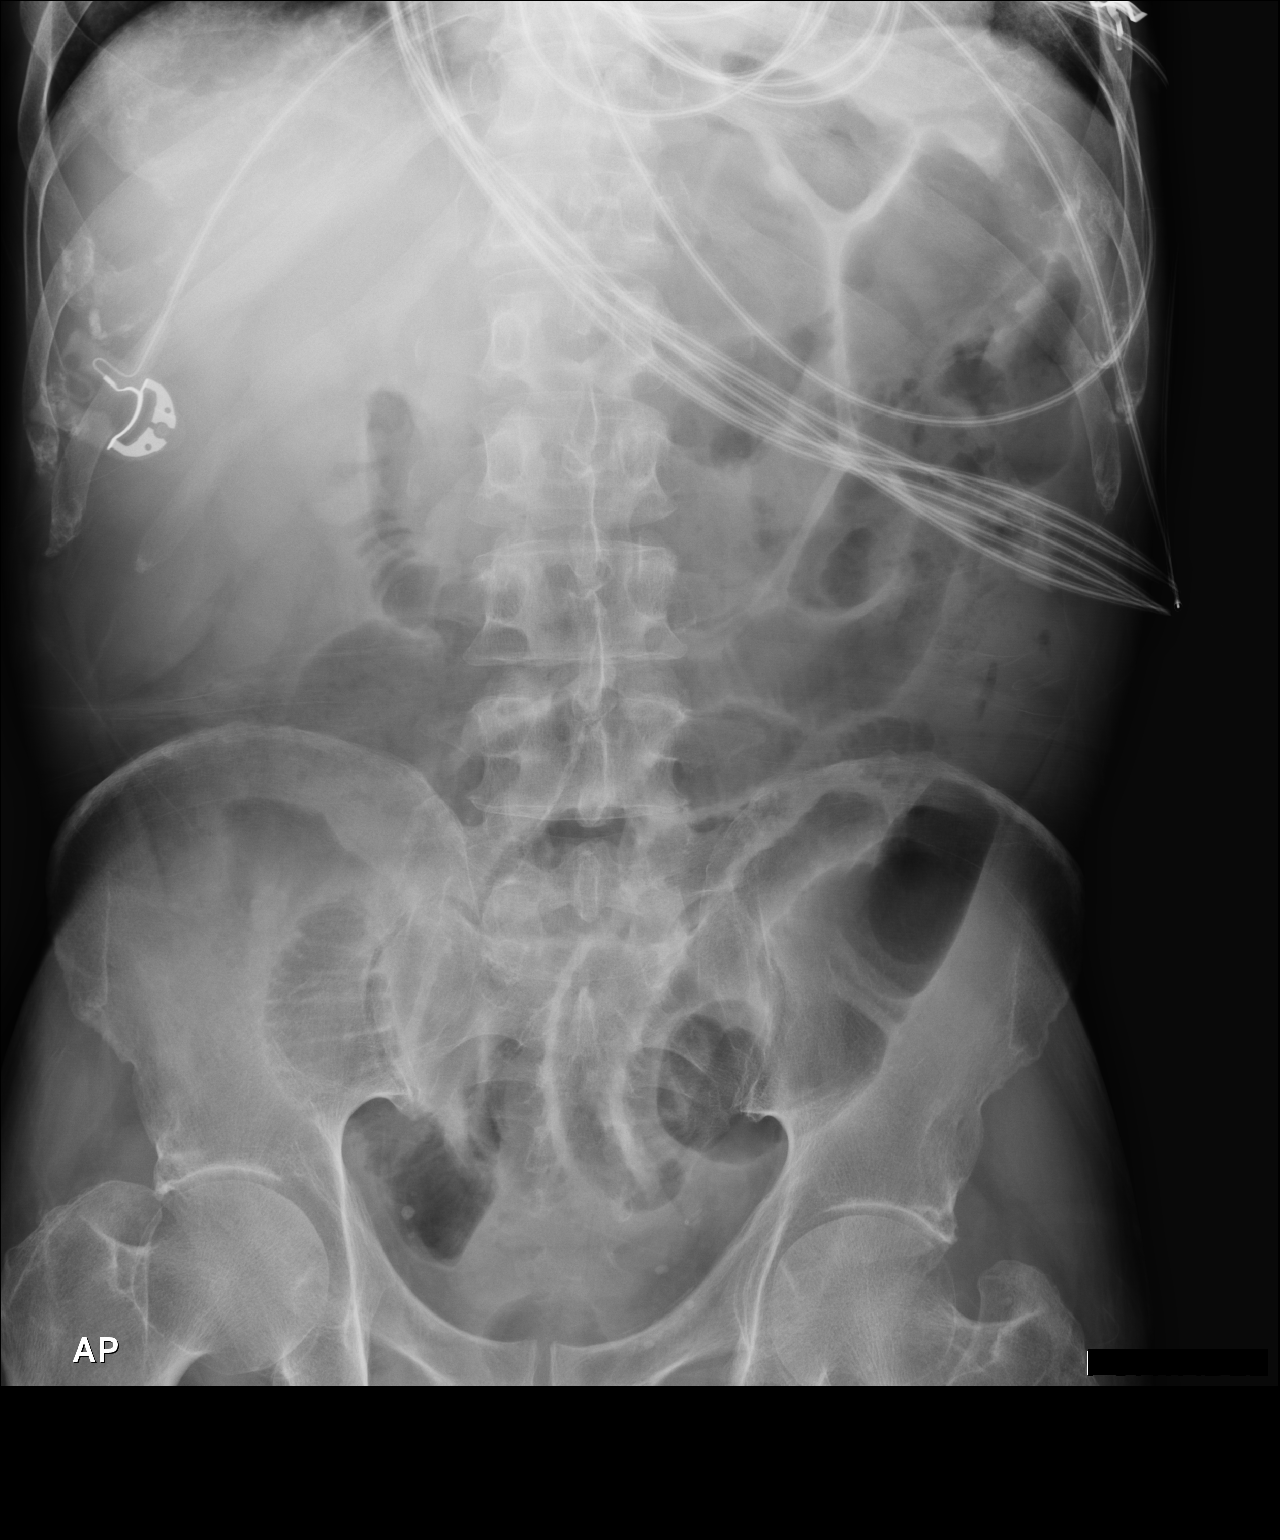

[1 of 1 positions shown; findings below may reference images not displayed]

FINDINGS: There are several dilated loops of small bowel identified. The
largest is in the left lower quadrant measuring up to 4.3 cm. A
paucity of colonic gas is noted. There is a small amount of gas
noted in the rectum.
IMPRESSION: 1. Dilated small bowel loops which may reflect recurrent
obstruction.
